# Patient Record
Sex: Male | Born: 1960 | Race: Black or African American | Hispanic: No | Marital: Married | Smoking: Former smoker
Health system: Southern US, Community
[De-identification: ages and names within clinical notes are randomized; demographics above are authoritative.]

## PROBLEM LIST (undated history)

## (undated) DIAGNOSIS — R030 Elevated blood-pressure reading, without diagnosis of hypertension: Secondary | ICD-10-CM

## (undated) DIAGNOSIS — R0600 Dyspnea, unspecified: Secondary | ICD-10-CM

## (undated) DIAGNOSIS — Z95828 Presence of other vascular implants and grafts: Secondary | ICD-10-CM

## (undated) DIAGNOSIS — G43909 Migraine, unspecified, not intractable, without status migrainosus: Secondary | ICD-10-CM

## (undated) DIAGNOSIS — R918 Other nonspecific abnormal finding of lung field: Secondary | ICD-10-CM

## (undated) DIAGNOSIS — N183 Chronic kidney disease, stage 3 unspecified: Secondary | ICD-10-CM

## (undated) DIAGNOSIS — E1142 Type 2 diabetes mellitus with diabetic polyneuropathy: Secondary | ICD-10-CM

## (undated) DIAGNOSIS — Z7189 Other specified counseling: Secondary | ICD-10-CM

## (undated) DIAGNOSIS — R0609 Other forms of dyspnea: Secondary | ICD-10-CM

## (undated) DIAGNOSIS — F1010 Alcohol abuse, uncomplicated: Secondary | ICD-10-CM

## (undated) DIAGNOSIS — J90 Pleural effusion, not elsewhere classified: Secondary | ICD-10-CM

## (undated) DIAGNOSIS — E119 Type 2 diabetes mellitus without complications: Secondary | ICD-10-CM

## (undated) DIAGNOSIS — R739 Hyperglycemia, unspecified: Secondary | ICD-10-CM

## (undated) DIAGNOSIS — Z8639 Personal history of other endocrine, nutritional and metabolic disease: Secondary | ICD-10-CM

## (undated) DIAGNOSIS — Z794 Long term (current) use of insulin: Secondary | ICD-10-CM

## (undated) DIAGNOSIS — M1990 Unspecified osteoarthritis, unspecified site: Secondary | ICD-10-CM

## (undated) DIAGNOSIS — Z87828 Personal history of other (healed) physical injury and trauma: Secondary | ICD-10-CM

## (undated) DIAGNOSIS — Z716 Tobacco abuse counseling: Secondary | ICD-10-CM

## (undated) DIAGNOSIS — F1410 Cocaine abuse, uncomplicated: Secondary | ICD-10-CM

## (undated) DIAGNOSIS — I10 Essential (primary) hypertension: Secondary | ICD-10-CM

## (undated) DIAGNOSIS — E11319 Type 2 diabetes mellitus with unspecified diabetic retinopathy without macular edema: Secondary | ICD-10-CM

## (undated) DIAGNOSIS — F329 Major depressive disorder, single episode, unspecified: Secondary | ICD-10-CM

## (undated) DIAGNOSIS — H547 Unspecified visual loss: Secondary | ICD-10-CM

## (undated) DIAGNOSIS — Z9289 Personal history of other medical treatment: Secondary | ICD-10-CM

## (undated) DIAGNOSIS — C3490 Malignant neoplasm of unspecified part of unspecified bronchus or lung: Secondary | ICD-10-CM

## (undated) DIAGNOSIS — H548 Legal blindness, as defined in USA: Secondary | ICD-10-CM

## (undated) DIAGNOSIS — Z87448 Personal history of other diseases of urinary system: Secondary | ICD-10-CM

## (undated) DIAGNOSIS — Z972 Presence of dental prosthetic device (complete) (partial): Secondary | ICD-10-CM

## (undated) DIAGNOSIS — R413 Other amnesia: Secondary | ICD-10-CM

## (undated) DIAGNOSIS — E785 Hyperlipidemia, unspecified: Secondary | ICD-10-CM

## (undated) DIAGNOSIS — F32A Depression, unspecified: Secondary | ICD-10-CM

## (undated) DIAGNOSIS — R351 Nocturia: Secondary | ICD-10-CM

## (undated) DIAGNOSIS — L0501 Pilonidal cyst with abscess: Secondary | ICD-10-CM

## (undated) HISTORY — DX: Tobacco abuse counseling: Z71.6

## (undated) HISTORY — DX: Type 2 diabetes mellitus with diabetic polyneuropathy: E11.42

## (undated) HISTORY — DX: Unspecified osteoarthritis, unspecified site: M19.90

## (undated) HISTORY — DX: Alcohol abuse, uncomplicated: F10.10

## (undated) HISTORY — DX: Hyperglycemia, unspecified: R73.9

## (undated) HISTORY — DX: Migraine, unspecified, not intractable, without status migrainosus: G43.909

## (undated) HISTORY — DX: Other amnesia: R41.3

## (undated) HISTORY — DX: Major depressive disorder, single episode, unspecified: F32.9

## (undated) HISTORY — DX: Essential (primary) hypertension: I10

## (undated) HISTORY — DX: Other specified counseling: Z71.89

## (undated) HISTORY — DX: Depression, unspecified: F32.A

## (undated) HISTORY — DX: Pleural effusion, not elsewhere classified: J90

---

## 2002-04-25 ENCOUNTER — Encounter: Admission: RE | Admit: 2002-04-25 | Discharge: 2002-04-25 | Payer: Self-pay | Admitting: Sports Medicine

## 2002-05-29 ENCOUNTER — Encounter: Admission: RE | Admit: 2002-05-29 | Discharge: 2002-05-29 | Payer: Self-pay | Admitting: Sports Medicine

## 2002-06-28 ENCOUNTER — Encounter: Admission: RE | Admit: 2002-06-28 | Discharge: 2002-06-28 | Payer: Self-pay | Admitting: Family Medicine

## 2002-07-31 ENCOUNTER — Encounter: Admission: RE | Admit: 2002-07-31 | Discharge: 2002-07-31 | Payer: Self-pay | Admitting: Sports Medicine

## 2002-11-21 ENCOUNTER — Encounter: Admission: RE | Admit: 2002-11-21 | Discharge: 2002-11-21 | Payer: Self-pay | Admitting: Family Medicine

## 2003-03-20 ENCOUNTER — Encounter: Admission: RE | Admit: 2003-03-20 | Discharge: 2003-03-20 | Payer: Self-pay | Admitting: Family Medicine

## 2003-03-20 ENCOUNTER — Ambulatory Visit (HOSPITAL_COMMUNITY): Admission: RE | Admit: 2003-03-20 | Discharge: 2003-03-20 | Payer: Self-pay | Admitting: Family Medicine

## 2003-12-03 ENCOUNTER — Ambulatory Visit: Payer: Self-pay | Admitting: Family Medicine

## 2003-12-03 ENCOUNTER — Observation Stay (HOSPITAL_COMMUNITY): Admission: EM | Admit: 2003-12-03 | Discharge: 2003-12-05 | Payer: Self-pay | Admitting: Emergency Medicine

## 2003-12-17 ENCOUNTER — Ambulatory Visit: Payer: Self-pay | Admitting: Sports Medicine

## 2003-12-24 ENCOUNTER — Encounter: Admission: RE | Admit: 2003-12-24 | Discharge: 2003-12-24 | Payer: Self-pay | Admitting: Family Medicine

## 2005-06-19 ENCOUNTER — Emergency Department (HOSPITAL_COMMUNITY): Admission: EM | Admit: 2005-06-19 | Discharge: 2005-06-19 | Payer: Self-pay | Admitting: Emergency Medicine

## 2005-12-14 ENCOUNTER — Ambulatory Visit: Payer: Self-pay | Admitting: Sports Medicine

## 2006-01-04 ENCOUNTER — Ambulatory Visit: Payer: Self-pay | Admitting: Family Medicine

## 2006-04-14 DIAGNOSIS — E785 Hyperlipidemia, unspecified: Secondary | ICD-10-CM

## 2006-04-14 DIAGNOSIS — E1139 Type 2 diabetes mellitus with other diabetic ophthalmic complication: Secondary | ICD-10-CM | POA: Insufficient documentation

## 2006-04-14 DIAGNOSIS — F1010 Alcohol abuse, uncomplicated: Secondary | ICD-10-CM | POA: Insufficient documentation

## 2006-05-10 ENCOUNTER — Ambulatory Visit: Payer: Self-pay | Admitting: Family Medicine

## 2006-05-13 ENCOUNTER — Ambulatory Visit: Payer: Self-pay | Admitting: Family Medicine

## 2007-02-08 ENCOUNTER — Emergency Department (HOSPITAL_COMMUNITY): Admission: EM | Admit: 2007-02-08 | Discharge: 2007-02-08 | Payer: Self-pay | Admitting: Family Medicine

## 2007-02-20 ENCOUNTER — Telehealth: Payer: Self-pay | Admitting: *Deleted

## 2007-10-09 ENCOUNTER — Telehealth: Payer: Self-pay | Admitting: *Deleted

## 2007-10-18 ENCOUNTER — Ambulatory Visit: Payer: Self-pay | Admitting: Family Medicine

## 2007-10-18 LAB — CONVERTED CEMR LAB
ALT: 38 units/L (ref 0–53)
AST: 22 units/L (ref 0–37)
Albumin: 4.4 g/dL (ref 3.5–5.2)
Alkaline Phosphatase: 71 units/L (ref 39–117)
BUN: 14 mg/dL (ref 6–23)
CO2: 25 meq/L (ref 19–32)
Calcium: 9.5 mg/dL (ref 8.4–10.5)
Chloride: 98 meq/L (ref 96–112)
Creatinine, Ser: 0.95 mg/dL (ref 0.40–1.50)
Glucose, Bld: 329 mg/dL — ABNORMAL HIGH (ref 70–99)
Hgb A1c MFr Bld: 9.7 %
Potassium: 4.9 meq/L (ref 3.5–5.3)
Sodium: 134 meq/L — ABNORMAL LOW (ref 135–145)
Total Bilirubin: 0.4 mg/dL (ref 0.3–1.2)
Total Protein: 7.7 g/dL (ref 6.0–8.3)

## 2007-10-19 ENCOUNTER — Encounter: Payer: Self-pay | Admitting: Family Medicine

## 2007-12-05 ENCOUNTER — Telehealth: Payer: Self-pay | Admitting: Family Medicine

## 2008-01-03 ENCOUNTER — Ambulatory Visit: Payer: Self-pay | Admitting: Family Medicine

## 2008-01-03 LAB — CONVERTED CEMR LAB: Hgb A1c MFr Bld: 12 %

## 2008-01-05 ENCOUNTER — Encounter: Payer: Self-pay | Admitting: Family Medicine

## 2008-03-20 ENCOUNTER — Ambulatory Visit: Payer: Self-pay | Admitting: Family Medicine

## 2008-05-01 ENCOUNTER — Ambulatory Visit: Payer: Self-pay | Admitting: Family Medicine

## 2008-05-01 LAB — CONVERTED CEMR LAB
ALT: 24 units/L (ref 0–53)
AST: 17 units/L (ref 0–37)
Albumin: 4.5 g/dL (ref 3.5–5.2)
Alkaline Phosphatase: 74 units/L (ref 39–117)
BUN: 11 mg/dL (ref 6–23)
CO2: 27 meq/L (ref 19–32)
Calcium: 9.4 mg/dL (ref 8.4–10.5)
Chloride: 95 meq/L — ABNORMAL LOW (ref 96–112)
Cholesterol: 226 mg/dL — ABNORMAL HIGH (ref 0–200)
Creatinine, Ser: 0.97 mg/dL (ref 0.40–1.50)
Glucose, Bld: 310 mg/dL — ABNORMAL HIGH (ref 70–99)
HDL: 41 mg/dL (ref >39)
LDL Cholesterol: 164 mg/dL — ABNORMAL HIGH (ref 0–99)
Potassium: 4.6 meq/L (ref 3.5–5.3)
Sodium: 134 meq/L — ABNORMAL LOW (ref 135–145)
Total Bilirubin: 0.5 mg/dL (ref 0.3–1.2)
Total CHOL/HDL Ratio: 5.5
Total Protein: 7.6 g/dL (ref 6.0–8.3)
Triglycerides: 104 mg/dL (ref <150)
VLDL: 21 mg/dL (ref 0–40)

## 2008-05-03 ENCOUNTER — Encounter: Payer: Self-pay | Admitting: Family Medicine

## 2008-12-08 ENCOUNTER — Emergency Department (HOSPITAL_COMMUNITY): Admission: EM | Admit: 2008-12-08 | Discharge: 2008-12-08 | Payer: Self-pay | Admitting: Emergency Medicine

## 2009-04-15 ENCOUNTER — Encounter: Payer: Self-pay | Admitting: *Deleted

## 2009-06-04 ENCOUNTER — Ambulatory Visit: Payer: Self-pay | Admitting: Family Medicine

## 2009-06-04 LAB — CONVERTED CEMR LAB: Hgb A1c MFr Bld: >14 %

## 2009-06-24 ENCOUNTER — Encounter: Payer: Self-pay | Admitting: Family Medicine

## 2009-09-10 ENCOUNTER — Ambulatory Visit: Payer: Self-pay | Admitting: Family Medicine

## 2009-09-10 LAB — CONVERTED CEMR LAB
ALT: 19 units/L (ref 0–53)
AST: 17 units/L (ref 0–37)
Albumin: 3.9 g/dL (ref 3.5–5.2)
Alkaline Phosphatase: 51 units/L (ref 39–117)
BUN: 10 mg/dL (ref 6–23)
CO2: 27 meq/L (ref 19–32)
Calcium: 9 mg/dL (ref 8.4–10.5)
Chloride: 106 meq/L (ref 96–112)
Creatinine, Ser: 0.97 mg/dL (ref 0.40–1.50)
Direct LDL: 105 mg/dL — ABNORMAL HIGH
Glucose, Bld: 135 mg/dL — ABNORMAL HIGH (ref 70–99)
Hgb A1c MFr Bld: 8.4 %
Potassium: 4.3 meq/L (ref 3.5–5.3)
Sodium: 141 meq/L (ref 135–145)
Total Bilirubin: 0.4 mg/dL (ref 0.3–1.2)
Total Protein: 6.2 g/dL (ref 6.0–8.3)

## 2009-09-11 ENCOUNTER — Encounter: Payer: Self-pay | Admitting: Family Medicine

## 2009-12-17 ENCOUNTER — Ambulatory Visit: Payer: Self-pay | Admitting: Family Medicine

## 2009-12-17 LAB — CONVERTED CEMR LAB: Hgb A1c MFr Bld: 10.7 %

## 2009-12-26 ENCOUNTER — Telehealth (INDEPENDENT_AMBULATORY_CARE_PROVIDER_SITE_OTHER): Payer: Self-pay | Admitting: Family Medicine

## 2010-03-17 NOTE — Miscellaneous (Signed)
Summary: re: ophthalmology referral  Clinical Lists Changes  Received notification form [facility] that they are unable to complete the ophthlomology referral at they time due to lack of volunteer physicians in this speciality group. They will notify patient when they can process  the referral. Theresia Lo RN  Jun 24, 2009 12:02 PM

## 2010-03-17 NOTE — Assessment & Plan Note (Signed)
Summary: MEDS/tcb   Vital Signs:  Patient profile:   50 year old male Height:      70 inches Weight:      171.0 pounds BMI:     24.62 Temp:     97.6 degrees F oral Pulse rate:   67 / minute BP sitting:   123 / 70  (left arm) Cuff size:   regular  Vitals Entered By: Gladstone Pih (June 04, 2009 11:47 AM) CC: med check, f/u DM Is Patient Diabetic? Yes Did you bring your meter with you today? No Pain Assessment Patient in pain? no        CC:  med check and f/u DM.  History of Present Illness: Diabetes follow up with blood sugars in  unknown  good control,   no episodes of low blood sugar. Taking medicines regularly and having no problems with them. Did NOT get the letter I sent him so did NOT increase his lantus and is still taking 30 at night.  Alcohol use: went on a binge of drinking--is now sober 4 months and staying in a transition house in [place]. Feels like he is back on trach again  Vision has been a little different--not exactly blurry but sometimes focusing is mre difficult. Wants to be referref to optho--has not had an exam in a while.  Knee pain: left knee pain worse over last 4-8 months. No injury. Has had problems w thi s in past and was told he had arthritis at one point. Aches w a lot of walking. no clicking or giving way   Habits & Providers  Alcohol-Tobacco-Diet     Tobacco Status: current     Tobacco Counseling: to quit use of tobacco products     Cigarette Packs/Day: 0.5  Current Medications (verified): 1)  Glucophage 1000 Mg  Tabs (Metformin Hcl) .Marland Kitchen.. 1 By Mouth Two Times A Day 2)  Lantus 100 Unit/ml Soln (Insulin Glargine) .... 50 Unitsu At Bedtime Disp Qs 1 Month 3)  Aspirin 81 Mg  Tbec (Aspirin) .... One By Mouth Every Day  Social History: Smoking Status:  current Packs/Day:  0.5  Physical Exam  General:  alert and well-developed.   Eyes:  pupils equal, pupils round, and pupils reactive to light.   Mouth:  teeth missing.   Neck:  supple, full  ROM, and no masses.   Lungs:  normal breath sounds.   Heart:  regular rhythm and no murmur.   Msk:  left knee lacks full extension by 5-10 degrees. no swelling. Medial joint line pain. ligamentously intact. no effusion. no warmth or erythema.   Impression & Recommendations:  Problem # 1:  DIABETES MELLITUS II, UNCOMPLICATED (ICD-250.00)  His updated medication list for this problem includes:    Glucophage 1000 Mg Tabs (Metformin hcl) .Marland Kitchen... 1 by mouth two times a day    Lantus 100 Unit/ml Soln (Insulin glargine) .Marland KitchenMarland KitchenMarland KitchenMarland Kitchen 50 unitsu at bedtime disp qs 1 month    Aspirin 81 Mg Tbec (Aspirin) ..... One by mouth every day discussed increasing to 50 u lantus. Orders: A1C-[facility] (16109) Ophthalmology Referral (Ophthalmology) [facility]- Est  Level 4 (60454)  Problem # 2:  ALCOHOL ABUSE, UNSPECIFIED (ICD-305.00)  Orders: [facility]- Est  Level 4 (09811) discussed  Problem # 3:  KNEE PAIN, LEFT (ICD-719.46)  His updated medication list for this problem includes:    Aspirin 81 Mg Tbec (Aspirin) ..... One by mouth every day injection therapy today will follow discussed possible effects of steroid inj on blood  sugars in next few days  Complete Medication List: 1)  Glucophage 1000 Mg Tabs (Metformin hcl) .Marland Kitchen.. 1 by mouth two times a day 2)  Lantus 100 Unit/ml Soln (Insulin glargine) .... 50 unitsu at bedtime disp qs 1 month 3)  Aspirin 81 Mg Tbec (Aspirin) .... One by mouth every day  Prescriptions: LANTUS 100 UNIT/ML SOLN (INSULIN GLARGINE) 50 unitsu at bedtime disp qs 1 month  #46m x 5   Entered and Authorized by:   Denny Levy MD   Signed by:   Denny Levy MD on 06/04/2009   Method used:   Print then Give to Patient   RxID:   [id number] GLUCOPHAGE 1000 MG  TABS (METFORMIN HCL) 1 by mouth two times a day  #60 x 4   Entered and Authorized by:   Denny Levy MD   Signed by:   Denny Levy MD on 06/04/2009   Method used:   Print then Give to Patient   RxID:   [id number]    Laboratory Results     Blood Tests   Date/Time Received: June 04, 2009 11:50 AM  Date/Time Reported: June 04, 2009 12:29 PM   HGBA1C: >14.0%   (Normal Range: Non-Diabetic - 3-6%   Control Diabetic - 6-8%)  Comments: ...............test performed by......Marland KitchenBonnie A. [place], MLS (ASCP)cm

## 2010-03-17 NOTE — Progress Notes (Signed)
----   Converted from flag ---- ---- 12/23/2009 10:55 AM, Madelon Lips Pharm D wrote: I think we should try to get him financially qualified for the MAP program [place].   He will need to qualify with [facility].   We may be able to help with samples at times BUT that is NOT a good idea long-term.   ---- 12/19/2009 1:48 PM, Denny Levy MD wrote: Cindee Lame this guy cannot afford his insulin any ideas? ------------------------------  DWT please call him and tell him how to go see Rudell Cobb / MAP .t  Denny Levy MD  December 26, 2009 2:58 PM Thanks!  Denny Levy MD  December 26, 2009 2:58 PM  Phone Note Outgoing Call   Call placed by: Loralee Pacas CMA,  December 29, 2009 12:34 PM

## 2010-03-17 NOTE — Miscellaneous (Signed)
Summary: sample of Lantus given  Clinical Lists Changes   patient comes in office stating he went to get Lantus RX from [facility] and it was going to cost too much for him to afford. he is in the process of getting orange card so he can get meds from [facility] pharmacy.  Hopefully he will get it in 10 days. gave him one sample of Lantus lot # [id number] exp date 06/2010. has a follow up appointment with Dr. Jennette Kettle 04/30/2009. Theresia Lo RN  April 15, 2009 12:21 PM

## 2010-03-17 NOTE — Letter (Signed)
Summary: [facility]  [address]   [place], [place] [address]   Phone: [phone]  Fax: [phone]    09/11/2009  Keith Holloway [address] [place], [place]  [address]  Dear Mr. [facility],  Your lab work looked A-OK. Your A1C is down to 8.4 as you know. Your LDL cholesterol is 105 and around 100 is our current goal. Eveything else was normal.Great to see you!         Sincerely,   Denny Levy MD  Appended Document: LABLetter mailed

## 2010-03-17 NOTE — Assessment & Plan Note (Signed)
Summary: f/u,df   Vital Signs:  Patient profile:   50 year old male Weight:      168.9 pounds Temp:     97.9 degrees F oral Pulse rate:   69 / minute Pulse rhythm:   regular BP sitting:   124 / 83  (left arm) Cuff size:   regular  Vitals Entered By: Loralee Pacas CMA (December 17, 2009 8:48 AM) CC: follow-up visit Is Patient Diabetic? Yes Did you bring your meter with you today? No Comments losing weight   CC:  follow-up visit.  History of Present Illness: not able to afford his insulin has missed about a month or more  Habits & Providers  Alcohol-Tobacco-Diet     Tobacco Status: current     Tobacco Counseling: to quit use of tobacco products     Cigarette Packs/Day: 0.5     Year Quit: 1996  Exercise-Depression-Behavior     Does Patient Exercise: yes     Exercise Counseling: to improve exercise regimen     Type of exercise: walking     Exercise (avg: min/session): 30-60     Times/week: 3     Have you felt down or hopeless? no     Have you felt little pleasure in things? no     Depression Counseling: not indicated; screening negative for depression     Seat Belt Use: always  Current Medications (verified): 1)  Glucophage 1000 Mg  Tabs (Metformin Hcl) .Marland Kitchen.. 1 By Mouth Two Times A Day 2)  Lantus 100 Unit/ml Soln (Insulin Glargine) .... 50 Unitsu At Bedtime Disp Qs 1 Month 3)  Aspirin 81 Mg  Tbec (Aspirin) .... One By Mouth Every Day 4)  Lisinopril 2.5 Mg Tabs (Lisinopril) .Marland Kitchen.. 1 By Mouth Once Daily For Kidney Protection  Social History: Does Patient Exercise:  yes Seat Belt Use:  always  Physical Exam  General:  alert, well-developed, and well-nourished.   Neck:  supple, full ROM, and no masses.   Lungs:  normal breath sounds.   Heart:  regular rhythm.   Msk:  FEET B had soft touch sensation intact  Vascular status  normal Dp 2+ B mild dry skin, no deformity or excessive callous. Beginning to have some hammer toe deformity R 2nd   Impression &  Recommendations:  Problem # 1:  DIABETES MELLITUS II, UNCOMPLICATED (ICD-250.00)  His updated medication list for this problem includes:    Glucophage 1000 Mg Tabs (Metformin hcl) .Marland Kitchen... 1 by mouth two times a day    Lantus 100 Unit/ml Soln (Insulin glargine) .Marland KitchenMarland KitchenMarland KitchenMarland Kitchen 50 unitsu at bedtime disp qs 1 month    Aspirin 81 Mg Tbec (Aspirin) ..... One by mouth every day    Lisinopril 2.5 Mg Tabs (Lisinopril) .Marland Kitchen... 1 by mouth once daily for kidney protection  Orders: A1C-[facility] (45409) [facility]- Est Level  3 (81191) will see if her can get him on program for insulin cost long discussion--he is not able to afford his insulin. we gave him a botle today--will increase to 70 units although hard to judge what he has really been getting.  Complete Medication List: 1)  Glucophage 1000 Mg Tabs (Metformin hcl) .Marland Kitchen.. 1 by mouth two times a day 2)  Lantus 100 Unit/ml Soln (Insulin glargine) .... 50 unitsu at bedtime disp qs 1 month 3)  Aspirin 81 Mg Tbec (Aspirin) .... One by mouth every day 4)  Lisinopril 2.5 Mg Tabs (Lisinopril) .Marland Kitchen.. 1 by mouth once daily for kidney protection  Patient Instructions:  1)  Increase your lantus to 70 units a day instead of the 50 units you have been taking. For now, continue the twice a day glucophage---we may get rid of that later. I am also adding one pill a day of lisinopril--that is for kidney protection for you diabetes. Please see me in 3 months.   Prescriptions: LISINOPRIL 2.5 MG TABS (LISINOPRIL) 1 by mouth once daily for kidney protection  #30 x 12   Entered and Authorized by:   Denny Levy MD   Signed by:   Denny Levy MD on 12/17/2009   Method used:   Electronically to        [facility]  [address] [phone]* (retail)       [address]       [place], [place]  [address]       Ph: [phone]       Fax: [phone]   RxID:   [id number]    Orders Added: 1)  A1C-[facility] [83036] 2)  [facility]- Est Level  3 [24401]    Prevention & Chronic  Care Immunizations   Influenza vaccine: Not documented    Tetanus booster: 05/01/2008: given   Tetanus booster due: 05/02/2018    Pneumococcal vaccine: Not documented  Other Screening   PSA: Not documented   Smoking status: current  (12/17/2009)  Diabetes Mellitus   HgbA1C: 10.7  (12/17/2009)   Hemoglobin A1C due: 04/04/2008    Eye exam: Not documented    Foot exam: Not documented   High risk foot: Not documented   Foot care education: Not documented    Urine microalbumin/creatinine ratio: Not documented    Diabetes flowsheet reviewed?: Yes   Progress toward A1C goal: Improved  Lipids   Total Cholesterol: 226  (05/01/2008)   LDL: 164  (05/01/2008)   LDL Direct: 105  (09/10/2009)   HDL: 41  (05/01/2008)   Triglycerides: 104  (05/01/2008)    SGOT (AST): 17  (09/10/2009)   SGPT (ALT): 19  (09/10/2009)   Alkaline phosphatase: 51  (09/10/2009)   Total bilirubin: 0.4  (09/10/2009)    Lipid flowsheet reviewed?: Yes   Progress toward LDL goal: At goal  Self-Management Support :    Diabetes self-management support: Not documented    Lipid self-management support: Not documented   Laboratory Results   Blood Tests   Date/Time Received: December 17, 2009 8:55 AM  Date/Time Reported: December 17, 2009 9:51 AM   HGBA1C: 10.7%   (Normal Range: Non-Diabetic - 3-6%   Control Diabetic - 6-8%)  Comments: ...............test performed by......Marland KitchenBonnie A. [place], MLS (ASCP)cm

## 2010-03-17 NOTE — Assessment & Plan Note (Signed)
Summary: f/u eo   Vital Signs:  Patient profile:   50 year old male Height:      70 inches Weight:      184 pounds BMI:     26.50 Temp:     97.8 degrees F oral Pulse rate:   69 / minute BP sitting:   112 / 74  (right arm) Cuff size:   regular  Vitals Entered By: Jimmy Footman, CMA (September 10, 2009 10:28 AM) CC: F/u diabetes, bloodwork Is Patient Diabetic? Yes Did you bring your meter with you today? No Pain Assessment Patient in pain? no        CC:  F/u diabetes and bloodwork.  History of Present Illness: Diabetes follow up with blood sugars in    pretty good control,   no episodes of low blood sugar. Taking medicines regularly when he can afford them and having no problems with them. Has an appointment today with the MAP program, currently out of insulin  Still living at [address] in [place]--is almost 6 months sober / clean now. At 1 year he hopes to transition out to be able to work     Current Medications (verified): 1)  Glucophage 1000 Mg  Tabs (Metformin Hcl) .Marland Kitchen.. 1 By Mouth Two Times A Day 2)  Lantus 100 Unit/ml Soln (Insulin Glargine) .... 50 Unitsu At Bedtime Disp Qs 1 Month 3)  Aspirin 81 Mg  Tbec (Aspirin) .... One By Mouth Every Day  Past History:  Past Medical History: Last updated: 04/14/2006 diabetic retinopathy-- [facility] laser 10/07, lost to fup, returned 10/07 --aic>10  Review of Systems  The patient denies anorexia, fever, weight loss, weight gain, chest pain, syncope, dyspnea on exertion, peripheral edema, prolonged cough, abdominal pain, incontinence, and muscle weakness.    Physical Exam  General:  alert, well-developed, and underweight appearing.   Eyes:  conjunctiva non icteric Nose:  no external deformity.   Neck:  supple, full ROM, and no masses.   Lungs:  normal respiratory effort and normal breath sounds.   Heart:  normal rate, regular rhythm, and no murmur.     Impression & Recommendations:  Problem # 1:  DIABETES MELLITUS II,  UNCOMPLICATED (ICD-250.00)  His updated medication list for this problem includes:    Glucophage 1000 Mg Tabs (Metformin hcl) .Marland Kitchen... 1 by mouth two times a day    Lantus 100 Unit/ml Soln (Insulin glargine) .Marland KitchenMarland KitchenMarland KitchenMarland Kitchen 50 unitsu at bedtime disp qs 1 month    Aspirin 81 Mg Tbec (Aspirin) ..... One by mouth every day  Orders: A1C-[facility] (16109) Comp Met-[facility] (60454-09811) [facility]- Est  Level 4 (91478) gave him 2 bottles of lantus--urged him to get back to MAP program--we may end up switching him to 70-30 if that is all that MAP has available. Great improvement from an A1C of 14 rtc 3 m  Problem # 2:  ALCOHOL ABUSE, UNSPECIFIED (ICD-305.00)  currently sober 6 m  Orders: [facility]- Est  Level 4 (29562)  Problem # 3:  HYPERLIPIDEMIA (ICD-272.4)  Orders: Comp Met-[facility] (13086-57846) Direct LDL-[facility] (96295-28413) [facility]- Est  Level 4 (24401)  Complete Medication List: 1)  Glucophage 1000 Mg Tabs (Metformin hcl) .Marland Kitchen.. 1 by mouth two times a day 2)  Lantus 100 Unit/ml Soln (Insulin glargine) .... 50 unitsu at bedtime disp qs 1 month 3)  Aspirin 81 Mg Tbec (Aspirin) .... One by mouth every day  Laboratory Results   Blood Tests   Date/Time Received: September 10, 2009 10:34 AM  Date/Time Reported:  September 10, 2009 10:54 AM   HGBA1C: 8.4%   (Normal Range: Non-Diabetic - 3-6%   Control Diabetic - 6-8%)  Comments: ...............test performed by......Marland KitchenBonnie A. [place], MLS (ASCP)cm

## 2010-03-26 ENCOUNTER — Encounter: Payer: Self-pay | Admitting: *Deleted

## 2010-05-12 ENCOUNTER — Inpatient Hospital Stay (INDEPENDENT_AMBULATORY_CARE_PROVIDER_SITE_OTHER)
Admission: RE | Admit: 2010-05-12 | Discharge: 2010-05-12 | Disposition: A | Discharge status: Home / Self Care | Payer: Self-pay | Source: Ambulatory Visit | Attending: Family Medicine | Admitting: Family Medicine

## 2010-05-12 DIAGNOSIS — E119 Type 2 diabetes mellitus without complications: Secondary | ICD-10-CM

## 2010-05-12 DIAGNOSIS — L723 Sebaceous cyst: Secondary | ICD-10-CM

## 2010-05-12 LAB — POCT URINALYSIS DIP (DEVICE)
Bilirubin Urine: NEGATIVE
Glucose, UA: 500 mg/dL — AB
Ketones, ur: NEGATIVE mg/dL
Nitrite: NEGATIVE
Protein, ur: NEGATIVE mg/dL
Specific Gravity, Urine: <=1.005 (ref 1.005–1.030)
Urobilinogen, UA: 0.2 mg/dL (ref 0.0–1.0)
pH: 6 (ref 5.0–8.0)

## 2010-05-12 LAB — GLUCOSE, CAPILLARY: Glucose-Capillary: 521 mg/dL — ABNORMAL HIGH (ref 70–99)

## 2010-05-13 ENCOUNTER — Other Ambulatory Visit: Payer: Self-pay | Admitting: Family Medicine

## 2010-05-13 MED ORDER — METFORMIN HCL 1000 MG PO TABS: 1000.0000 mg | ORAL_TABLET | Freq: Two times a day (BID) | ORAL | Status: DC

## 2010-05-18 ENCOUNTER — Observation Stay (HOSPITAL_COMMUNITY)
Admission: EM | Admit: 2010-05-18 | Discharge: 2010-05-18 | Disposition: A | Discharge status: Home / Self Care | Payer: Self-pay | Attending: Emergency Medicine | Admitting: Emergency Medicine

## 2010-05-18 DIAGNOSIS — E119 Type 2 diabetes mellitus without complications: Principal | ICD-10-CM | POA: Insufficient documentation

## 2010-05-18 DIAGNOSIS — Z5900 Homelessness unspecified: Secondary | ICD-10-CM | POA: Insufficient documentation

## 2010-05-18 DIAGNOSIS — F1010 Alcohol abuse, uncomplicated: Secondary | ICD-10-CM | POA: Insufficient documentation

## 2010-05-18 DIAGNOSIS — R21 Rash and other nonspecific skin eruption: Secondary | ICD-10-CM | POA: Insufficient documentation

## 2010-05-18 DIAGNOSIS — F1410 Cocaine abuse, uncomplicated: Secondary | ICD-10-CM | POA: Insufficient documentation

## 2010-05-18 DIAGNOSIS — Z794 Long term (current) use of insulin: Secondary | ICD-10-CM | POA: Insufficient documentation

## 2010-05-18 DIAGNOSIS — F17200 Nicotine dependence, unspecified, uncomplicated: Secondary | ICD-10-CM | POA: Insufficient documentation

## 2010-05-18 LAB — POCT I-STAT, CHEM 8
BUN: 13 mg/dL (ref 6–23)
Calcium, Ion: 1.11 mmol/L — ABNORMAL LOW (ref 1.12–1.32)
Chloride: 100 mEq/L (ref 96–112)
Creatinine, Ser: 1.1 mg/dL (ref 0.4–1.5)
Glucose, Bld: 423 mg/dL — ABNORMAL HIGH (ref 70–99)
HCT: 45 % (ref 39.0–52.0)
Hemoglobin: 15.3 g/dL (ref 13.0–17.0)
Potassium: 4.4 mEq/L (ref 3.5–5.1)
Sodium: 137 mEq/L (ref 135–145)
TCO2: 26 mmol/L (ref 0–100)

## 2010-05-18 LAB — GLUCOSE, CAPILLARY
Glucose-Capillary: 529 mg/dL — ABNORMAL HIGH (ref 70–99)
Glucose-Capillary: 280 mg/dL — ABNORMAL HIGH (ref 70–99)
Glucose-Capillary: 243 mg/dL — ABNORMAL HIGH (ref 70–99)
Glucose-Capillary: 219 mg/dL — ABNORMAL HIGH (ref 70–99)

## 2010-05-18 LAB — POCT I-STAT 3, VENOUS BLOOD GAS (G3P V)
Acid-Base Excess: 5 mmol/L — ABNORMAL HIGH (ref 0.0–2.0)
Bicarbonate: 31 mEq/L — ABNORMAL HIGH (ref 20.0–24.0)
O2 Saturation: 55 %
TCO2: 33 mmol/L (ref 0–100)
pCO2, Ven: 51.3 mmHg — ABNORMAL HIGH (ref 45.0–50.0)
pH, Ven: 7.39 — ABNORMAL HIGH (ref 7.250–7.300)
pO2, Ven: 30 mmHg (ref 30.0–45.0)

## 2010-05-21 LAB — CBC
HCT: 46.3 % (ref 39.0–52.0)
Hemoglobin: 15 g/dL (ref 13.0–17.0)
MCHC: 32.5 g/dL (ref 30.0–36.0)
MCV: 71.8 fL — ABNORMAL LOW (ref 78.0–100.0)
Platelets: 179 10*3/uL (ref 150–400)
RBC: 6.44 MIL/uL — ABNORMAL HIGH (ref 4.22–5.81)
RDW: 14.1 % (ref 11.5–15.5)
WBC: 3.7 10*3/uL — ABNORMAL LOW (ref 4.0–10.5)

## 2010-05-21 LAB — DIFFERENTIAL
Basophils Absolute: 0 10*3/uL (ref 0.0–0.1)
Basophils Relative: 1 % (ref 0–1)
Eosinophils Relative: 1 % (ref 0–5)
Monocytes Absolute: 0.3 10*3/uL (ref 0.1–1.0)
Monocytes Relative: 9 % (ref 3–12)

## 2010-05-21 LAB — BASIC METABOLIC PANEL
BUN: 10 mg/dL (ref 6–23)
CO2: 30 mEq/L (ref 19–32)
Calcium: 9.1 mg/dL (ref 8.4–10.5)
Chloride: 99 mEq/L (ref 96–112)
Creatinine, Ser: 0.97 mg/dL (ref 0.4–1.5)
GFR calc Af Amer: >60 mL/min (ref >60)
GFR calc non Af Amer: >60 mL/min (ref >60)
Glucose, Bld: 413 mg/dL — ABNORMAL HIGH (ref 70–99)
Potassium: 4.1 mEq/L (ref 3.5–5.1)
Sodium: 134 mEq/L — ABNORMAL LOW (ref 135–145)

## 2010-05-21 LAB — ETHANOL: Alcohol, Ethyl (B): <5 mg/dL (ref 0–10)

## 2010-05-21 LAB — GLUCOSE, CAPILLARY: Glucose-Capillary: 250 mg/dL — ABNORMAL HIGH (ref 70–99)

## 2010-05-21 LAB — RAPID URINE DRUG SCREEN, HOSP PERFORMED
Amphetamines: NOT DETECTED
Barbiturates: NOT DETECTED
Benzodiazepines: NOT DETECTED
Cocaine: POSITIVE — AB
Opiates: NOT DETECTED
Tetrahydrocannabinol: NOT DETECTED

## 2010-07-03 NOTE — H&P (Signed)
NAME:  Keith Holloway, TSE NO.:  [id number]   MEDICAL RECORD NO.:  [id number]          PATIENT TYPE:  OBV   LOCATION:  1824                         FACILITY:  [facility]   PHYSICIAN:  Barney Drain, M.D.    DATE OF BIRTH:  1960/06/19   DATE OF ADMISSION:  12/03/2003  DATE OF DISCHARGE:                                HISTORY & PHYSICAL   HISTORY OF PRESENT ILLNESS:  A 50 year old male with a history of diabetes  brought to the emergency department after experiencing lightheadedness,  blurred vision and nausea while at work prior to admission.  The patient  states that he felt that his glucose was high, although he did not check.  He felt better after eating an apple.  Over the past three weeks the patient  has had subjective fevers and shaking chills and night sweats, nonproductive  cough, severe sore throat.  No known sick contacts or travel.  No nausea,  vomiting, diarrhea, or constipation.  Complains of a significantly decreased  appetite.  He states that he has been able to go to work, but has decreased  his socialization significantly.  He has had much fatigue and marked  decrease in activity tolerance for the past several months.  He denies any  dyspnea on exertion.  He has had no incarceration longer than a few days in  a county jail.  There has been an estimated weight loss from 210 to about  185 pounds over the past month.  The patient and wife note that his clothes  definitely fit differently.  There has been no chest pain, shortness of  breath, or edema.  No dyspnea on exertion or hemoptysis.  The patient has  noted increased flatus and he has felt bloated.  He had had some reflux  symptoms feeling that there is something in his throat, which was helped by  an over-the-counter H2 blocker.  There has been no numbness, paresthesias or  weakness.  No jaundice or rash.  The patient has felt down since his illness  over the past few weeks, but denies any depression.   Depression screen  negative.  He states that his bilateral knees have been uncomfortable,  especially over the past 24 hours, but has not noted any effusion.  He  complains of persistent polyuria and polydipsia, which he states is  consistent with his sugars being high.  There has been no penile discharge  or dysuria.  Overall, the patient states that his symptoms are getting  worse.   PAST MEDICAL HISTORY:  1.  Diabetes mellitus type 2.  Last hemoglobin A1c was 10.7 from February      2005.  2.  Hyperlipidemia.  3.  The patient has quit smoking.   PAST SURGICAL HISTORY:  None.   MEDICATIONS:  1.  Ecotrin 325 mg p.o. daily.  2.  Glucotrol XL 10 mg b.i.d.  3.  Glucovance 05/998 mg b.i.d.  4.  Lipitor 20 mg daily.   ALLERGIES:  NO KNOWN DRUG ALLERGIES.   SOCIAL HISTORY:  The patient is married and lives with his wife Mindi Junker who  can be reached  at her cell phone [phone].  He has been tobacco-free for  more than 10 years, he previously smoked 1/2 pack a day.  He denies any  recreational drug use.  He is a binge drinker on the weekends and about one  time a week, he used to drink regularly.  CAGE questionnaire is positive.  He has attended support groups in the past to quit drinking, however he  states that he has not admitted that he has a drinking problem and once he  does, he knows he will be able to quit.  He is employed at [employer].   FAMILY HISTORY:  Significant for diabetes.   REVIEW OF SYSTEMS:  Documented above.   PHYSICAL EXAMINATION:  GENERAL:  Well cared for, slightly fatigued-appearing  male, comfortable, pleasant, wife is at bedside.  VITAL SIGNS:  Temperature 103.1 at 14:00, currently 102, heart rate 96,  blood pressure 138/73, O2 saturation 95% on room air.  HEENT:  Normocephalic and atraumatic.  Mucosal membranes are pink and moist.  Oropharynx is erythematous with mild tonsillar pallor, edema, no exudates.  The patient has a partial upper denture plate.   Gingiva are normal.  Nares  patent.  NECK:  No thyromegaly or masses.  LYMPHATICS:  No palpable anterior, cervical, occipital, axillary  lymphadenopathy appreciated.  CARDIOVASCULAR:  Normal precordium S1, S2 regular rate and rhythm.  There is  a 1/6 systolic ejection murmur best heard at the base, no gallops, 2/2  peripheral pulses in all four extremities, no JVD.  Cap refill less than 2  seconds.  LUNGS:  Good air exchange, clear to auscultation bilaterally, except for dry  bibasilar crackle that is mild, no wheeze.  ABDOMEN:  Normoactive bowel sounds, moderately distended, tympany to  percussion, soft, nontender, no hepatosplenomegaly or masses.  EXTREMITIES:  No peripheral edema, erythema, or calor.  No joint effusion,  erythema, or calor.  The skin on the feet is dry and cracked, no open sores,  no rash, no jaundice, no clubbing.  NEUROLOGIC:  Alert and oriented, mentation intact.  Cranial nerves II-XII  intact.  Strength is 5/5 globally, sensation is intact to light touch  globally.   LABS:  White blood cell count 7.5, hemoglobin 14.6, platelets 197, ANC 6.6,  ALC 0.7, peripheral smear reveals __________  PMNs, large platelets, few  target cells and a few teardrop cells, sodium 128, corrected for glucose of  132, potassium 4.2, chloride 94, bicarb 24, BUN 9, creatinine 1, glucose  322, calcium 8.9.  Urinalysis -- yellow and clear, specific gravity 1.014,  glucose greater than 1000, protein 30, no nitrite, no leukocyte esterase.  Urine microscopic examination revealed rare squamous cells, white blood cell  count 0-2.  PA and lateral chest x-ray revealed linear atelectasis versus  scar in the lingula, no acute disease.   ASSESSMENT/PLAN:  A 50 year old male with a fever of unknown origin, weight  loss unintentional, and hyperglycemia.  1.  Fever of unknown origin documented.  Significant and persistent fever in      the emergency department with reported temperature chills and  sweats     prior to admission.  Differential diagnosis is broad and exam and labs      are reassuring overall.  Infection is less likely with leukocytosis and      nonlocalizing symptoms.  Oropharyngeal erythema and cough suggest an      upper respiratory infection, but prolonged course of symptoms is      inconsistent with a viral infection.  No increased respiratory rate or      hypoxia and a negative chest x-ray make pneumonia unlikely.  We will      check a PPD with history of night sweats and weight loss, but no      granulomas on chest x-ray.  We will follow blood cultures, send urine      for culture, check abdominal x-rays.  The patient is distended, cancer      is of concern, please see workup below.  Connective tissue disease is      possible.  We will check an ESR, consider ANCA and double-stranded DNA,      but again no specific localizing symptoms.  Knees have a normal      examination.  Blood smear is benign.  Differential within normal limits,      therefore leukemia and lymphoma are less likely.  We will follow fever      curve, labs, and hold antibiotics for now.  2.  Unintentional weight loss.  Cancer is the most concerning potential      etiology.  No neck mass, but complains of dysphasia that was improved by      an H2 blocker suggesting this was more of a reflux symptom than of      obstruction.  Poor appetite n.p.o., but no early satiety.  No bright red      blood per rectum or melena.  We will check fecal occult blood stools x3,      perform a chest and abdominal CT to evaluate lungs, check for      lymphadenopathy and liver disease, as the patient has a history of ETOH      and tobacco.  We will check liver function tests.  Endocrine disease is      also possible.  The patient has diabetes mellitus which could account      for malaise and weight loss, as his diabetes has been uncontrolled.  We      will check a TSH to rule out thyroid dysfunction.  Calcium is  within      normal limits therefore parathyroid dysfunction is less likely.  The      patient is a binge alcoholic.  I counseled him on cessation.  The      patient needs referral for a support group.  Depression is possible, but      his decreased mood correlates with his illness course and is mostly      reactive.  3.  Diabetes mellitus, uncontrolled.  May account for polyuria and      polydipsia, weight loss, and malaise.  We will check a hemoglobin A1c.      Initiate Lantus and insulin sliding scale.  Continue Glucotrol and hold      Glucovance as planning to perform CTs.  The patient needs diabetes      education.  Discuss foot care as feet are dry and cracked.  Begin      enalapril for renal protection.  Note the patient is a diabetic with      proteinuria and will follow creatinine.  Continue aspirin for coronary      artery disease prophylaxis.  Provide intravenous fluid rehydration to      help decrease sugars.  We will need to follow potassium closely as     providing intravenous fluid rehydration and insulin.  4.  Hyponatremia is relative.  We will follow.  5.  Alcoholism.  CAGE positive.  Consult and need referral for support      group.  6.  Hypercholesterolemia.  We will check a fasting lipid profile and      continue Lipitor.  7.  Gastroesophageal reflux disease.  We will provide Protonix while      inpatient.  Question if the patient needs an endoscopy with a history of      alcoholism and tobacco which increases his risk for gastric cancer, and      has significant reflux symptoms.  Certainly this may be a more      appropriate study to perform as an outpatient if it is indicated.  8.  Lightheadedness.  This was a presyncopal episode and most likely      secondary to hypoglycemia as this resolved with food.  Potential      significant fever could also cause these symptoms.  Reassured by      nonfocal neurologic examination.  The patient did not complain of       palpitations or chest pain.  We will follow clinically.   DISPOSITION:  We will admit for 23-hour observation to initiate workup.  We  will determine if further inpatient evaluation is necessary, as the patients  results come back.       SS/MEDQ  D:  12/03/2003  T:  12/04/2003  Job:  [id number]   cc:   Lurena Joiner L. Jolinda Croak, M.D.  [address] [place]  [place] [address]  Fax: [phone]

## 2010-11-20 LAB — WOUND CULTURE

## 2012-04-15 HISTORY — PX: RETINAL DETACHMENT SURGERY: SHX105

## 2012-08-15 HISTORY — PX: CATARACT EXTRACTION, BILATERAL: SHX1313

## 2013-03-28 ENCOUNTER — Other Ambulatory Visit: Payer: Self-pay | Admitting: *Deleted

## 2013-03-28 ENCOUNTER — Ambulatory Visit: Payer: Medicaid Other | Attending: Internal Medicine | Admitting: Internal Medicine

## 2013-03-28 VITALS — BP 143/88 | HR 88 | Temp 98.9°F | Resp 14 | Ht 71.0 in | Wt 208.0 lb

## 2013-03-28 DIAGNOSIS — R079 Chest pain, unspecified: Secondary | ICD-10-CM | POA: Insufficient documentation

## 2013-03-28 DIAGNOSIS — Z7982 Long term (current) use of aspirin: Secondary | ICD-10-CM | POA: Insufficient documentation

## 2013-03-28 DIAGNOSIS — Z23 Encounter for immunization: Secondary | ICD-10-CM

## 2013-03-28 DIAGNOSIS — E785 Hyperlipidemia, unspecified: Secondary | ICD-10-CM | POA: Insufficient documentation

## 2013-03-28 DIAGNOSIS — M79609 Pain in unspecified limb: Secondary | ICD-10-CM | POA: Insufficient documentation

## 2013-03-28 DIAGNOSIS — Z794 Long term (current) use of insulin: Secondary | ICD-10-CM | POA: Insufficient documentation

## 2013-03-28 DIAGNOSIS — E119 Type 2 diabetes mellitus without complications: Secondary | ICD-10-CM

## 2013-03-28 DIAGNOSIS — I10 Essential (primary) hypertension: Secondary | ICD-10-CM

## 2013-03-28 LAB — LIPID PANEL
Cholesterol: 112 mg/dL (ref 0–200)
HDL: 33 mg/dL — ABNORMAL LOW (ref >39)
LDL CALC: 56 mg/dL (ref 0–99)
Total CHOL/HDL Ratio: 3.4 Ratio
Triglycerides: 117 mg/dL (ref <150)
VLDL: 23 mg/dL (ref 0–40)

## 2013-03-28 LAB — COMPLETE METABOLIC PANEL WITH GFR
ALBUMIN: 4.3 g/dL (ref 3.5–5.2)
ALK PHOS: 94 U/L (ref 39–117)
ALT: 26 U/L (ref 0–53)
AST: 15 U/L (ref 0–37)
BUN: 15 mg/dL (ref 6–23)
CO2: 28 mEq/L (ref 19–32)
Calcium: 9.4 mg/dL (ref 8.4–10.5)
Chloride: 102 mEq/L (ref 96–112)
Creat: 0.97 mg/dL (ref 0.50–1.35)
GFR, EST NON AFRICAN AMERICAN: 89 mL/min
GFR, Est African American: >89 mL/min
GLUCOSE: 156 mg/dL — AB (ref 70–99)
POTASSIUM: 4.6 meq/L (ref 3.5–5.3)
SODIUM: 141 meq/L (ref 135–145)
TOTAL PROTEIN: 7.2 g/dL (ref 6.0–8.3)
Total Bilirubin: 0.3 mg/dL (ref 0.2–1.2)

## 2013-03-28 LAB — GLUCOSE, POCT (MANUAL RESULT ENTRY): POC Glucose: 147 mg/dl — AB (ref 70–99)

## 2013-03-28 MED ORDER — FREESTYLE SYSTEM KIT: 1.0000 | PACK | Status: DC | PRN

## 2013-03-28 MED ORDER — GLUCOSE BLOOD VI STRP: ORAL_STRIP | Status: DC

## 2013-03-28 MED ORDER — SIMVASTATIN 40 MG PO TABS: 40.0000 mg | ORAL_TABLET | Freq: Every day | ORAL | Status: DC

## 2013-03-28 MED ORDER — INSULIN NPH ISOPHANE & REGULAR (70-30) 100 UNIT/ML ~~LOC~~ SUSP: 30.0000 [IU] | Freq: Two times a day (BID) | SUBCUTANEOUS | Status: DC

## 2013-03-28 MED ORDER — METFORMIN HCL 1000 MG PO TABS
1000.0000 mg | ORAL_TABLET | Freq: Two times a day (BID) | ORAL | Status: DC
Start: 2013-03-28 — End: 2014-01-23

## 2013-03-28 MED ORDER — LISINOPRIL 20 MG PO TABS: 20.0000 mg | ORAL_TABLET | Freq: Every day | ORAL | Status: DC

## 2013-03-28 NOTE — Progress Notes (Signed)
Patient ID: Keith Holloway, male   DOB: March 25, 1960, 53 y.o.   MRN: [id number]   CC:  HPI: 53 year old male here to establish care he has a long-standing history of diabetes. His last A1c on 03/06/13 was 9.7. He currently is using 70 x 30 insulin 20 units in the morning and 20 units in the evening he does not check his sugar on a regular basis. The patient was recently released from jail. While he was in jail,He developed retinal detachment and was seen by Dr. Carlynn Herald, [facility] had surgery. He was diagnosed as being legally blind. He had another visit with Dr. Raylene Miyamoto in January 2015. Patient appears to be compliant with all his other medications.  Social history nonsmoker nonalcoholic Family history father had cancer but is not sure what kind   Allergies not on file Past Medical History  Diagnosis Date  . Diabetes mellitus without complication    Current Outpatient Prescriptions on File Prior to Visit  Medication Sig Dispense Refill  . aspirin 81 MG EC tablet Take 81 mg by mouth daily.         No current facility-administered medications on file prior to visit.   History reviewed. No pertinent family history. History   Social History  . Marital Status: Married    Spouse Name: N/A    Number of Children: N/A  . Years of Education: N/A   Occupational History  . Not on file.   Social History Main Topics  . Smoking status: Never Smoker   . Smokeless tobacco: Not on file  . Alcohol Use: No  . Drug Use: No  . Sexual Activity: Not on file   Other Topics Concern  . Not on file   Social History Narrative  . No narrative on file    Review of Systems  Constitutional: Negative for fever, chills, diaphoresis, activity change, appetite change and fatigue.  HENT: Negative for ear pain, nosebleeds, congestion, facial swelling, rhinorrhea, neck pain, neck stiffness and ear discharge.   Eyes: Negative for pain, discharge, redness, itching and visual disturbance.   Respiratory: Negative for cough, choking, chest tightness, shortness of breath, wheezing and stridor.   Cardiovascular: Negative for chest pain, palpitations and leg swelling.  Gastrointestinal: Negative for abdominal distention.  Genitourinary: Negative for dysuria, urgency, frequency, hematuria, flank pain, decreased urine volume, difficulty urinating and dyspareunia.  Musculoskeletal: Negative for back pain, joint swelling, arthralgias and gait problem.  Neurological: Negative for dizziness, tremors, seizures, syncope, facial asymmetry, speech difficulty, weakness, light-headedness, numbness and headaches.  Hematological: Negative for adenopathy. Does not bruise/bleed easily.  Psychiatric/Behavioral: Negative for hallucinations, behavioral problems, confusion, dysphoric mood, decreased concentration and agitation.    Objective:   Filed Vitals:   03/28/13 1650  BP: 143/88  Pulse: 88  Temp: 98.9 F (37.2 C)  Resp: 14    Physical Exam  Constitutional: Appears well-developed and well-nourished. No distress.  HENT: Legally blind. External right and left ear normal. Oropharynx is clear and moist.  Eyes: Conjunctivae and EOM are normal. PERRLA, no scleral icterus.  Neck: Normal ROM. Neck supple. No JVD. No tracheal deviation. No thyromegaly.  CVS: RRR, S1/S2 +, no murmurs, no gallops, no carotid bruit.  Pulmonary: Effort and breath sounds normal, no stridor, rhonchi, wheezes, rales.  Abdominal: Soft. BS +,  no distension, tenderness, rebound or guarding.  Musculoskeletal: Normal range of motion. No edema and no tenderness.  Lymphadenopathy: No lymphadenopathy noted, cervical, inguinal. Neuro: Alert. Normal reflexes, muscle tone coordination. No cranial nerve  deficit. Skin: Skin is warm and dry. No rash noted. Not diaphoretic. No erythema. No pallor.  Psychiatric: Normal mood and affect. Behavior, judgment, thought content normal.   Lab Results  Component Value Date   WBC 3.7*  12/08/2008   HGB 15.3 05/18/2010   HCT 45.0 05/18/2010   MCV 71.8* 12/08/2008   PLT 179 12/08/2008   Lab Results  Component Value Date   CREATININE 1.1 05/18/2010   BUN 13 05/18/2010   NA 137 05/18/2010   K 4.4 05/18/2010   CL 100 05/18/2010   CO2 27 09/10/2009    Lab Results  Component Value Date   HGBA1C 10.7 12/17/2009   Lipid Panel     Component Value Date/Time   CHOL 226* 05/01/2008 2043   TRIG 104 05/01/2008 2043   HDL 41 05/01/2008 2043   CHOLHDL 5.5 Ratio 05/01/2008 2043   VLDL 21 05/01/2008 2043   LDLCALC 164* 05/01/2008 2043       Assessment and plan:   Patient Active Problem List   Diagnosis Date Noted  . DIABETES MELLITUS II, UNCOMPLICATED 93/90/3009  . HYPERLIPIDEMIA 04/14/2006  . ALCOHOL ABUSE, UNSPECIFIED 04/14/2006       Diabetes mellitus insulin-dependent Repeat A1c His Accu-Cheks have been ranging from 250-300 We'll increase NPH 70/30-30 units in the morning and 30 units in evening Provided with a glucometer and glucose drips Patient already has an ophthalmologist that he sees at [facility] and he will continue to follow with him He is oriented diagnosed with diabetic retinopathy and is legally blind   Hypertension Renal panel today Lisinopril   Dyslipidemia Continue simvastatin Lipid panel   Establish care Tetanus booster Gastroenterology referral Routine colonoscopy   Followup in 6 weeks  The patient was given clear instructions to go to ER or return to medical center if symptoms don't improve, worsen or new problems develop. The patient verbalized understanding. The patient was told to call to get any lab results if not heard anything in the next week.

## 2013-03-28 NOTE — Progress Notes (Signed)
Patient is here to establish care. Patient is hypertension, a diabetic and has issues with cholesterol. Complains of pain in Lt arm and chest pain x1 years. Patient is legally blind. Possible arthritis in Lt arm.

## 2013-03-29 LAB — CBC WITH DIFFERENTIAL/PLATELET
Basophils Absolute: 0 K/uL (ref 0.0–0.1)
Basophils Relative: 0 % (ref 0–1)
Eosinophils Absolute: 0.1 K/uL (ref 0.0–0.7)
Eosinophils Relative: 2 % (ref 0–5)
HCT: 38.9 % — ABNORMAL LOW (ref 39.0–52.0)
Hemoglobin: 12.5 g/dL — ABNORMAL LOW (ref 13.0–17.0)
Lymphocytes Relative: 34 % (ref 12–46)
Lymphs Abs: 1.8 K/uL (ref 0.7–4.0)
MCH: 21.6 pg — ABNORMAL LOW (ref 26.0–34.0)
MCHC: 32.1 g/dL (ref 30.0–36.0)
MCV: 67.3 fL — ABNORMAL LOW (ref 78.0–100.0)
Monocytes Absolute: 0.5 K/uL (ref 0.1–1.0)
Monocytes Relative: 9 % (ref 3–12)
Neutro Abs: 2.9 K/uL (ref 1.7–7.7)
Neutrophils Relative %: 55 % (ref 43–77)
Platelets: 285 K/uL (ref 150–400)
RBC: 5.78 MIL/uL (ref 4.22–5.81)
RDW: 16.8 % — ABNORMAL HIGH (ref 11.5–15.5)
WBC: 5.3 K/uL (ref 4.0–10.5)

## 2013-03-29 LAB — VITAMIN D 25 HYDROXY (VIT D DEFICIENCY, FRACTURES): Vit D, 25-Hydroxy: 26 ng/mL — ABNORMAL LOW (ref 30–89)

## 2013-03-29 LAB — TSH: TSH: 1.863 u[IU]/mL (ref 0.350–4.500)

## 2013-03-30 MED ORDER — FERROUS SULFATE 325 (65 FE) MG PO TABS: 325.0000 mg | ORAL_TABLET | Freq: Every day | ORAL | Status: DC

## 2013-04-02 ENCOUNTER — Telehealth: Payer: Self-pay | Admitting: *Deleted

## 2013-04-02 NOTE — Telephone Encounter (Signed)
Message copied by Amiliah Campisi, [place] R on Mon Apr 02, 2013  9:16 AM ------      Message from: Allyson Sabal MD, Ascencion Dike      Created: Fri Mar 30, 2013  2:13 PM       Notify patient of the labs are normal except hemoglobin which is 12.5. Already called in a prescription for ferrous sulfate. Patient encouraged to follow up with gastroenterology for a colonoscopy given his anemia            Vitamin D level is low at 26, patient advised to take vitamin D 2000 international units twice a day ------

## 2013-04-02 NOTE — Telephone Encounter (Signed)
Left a message with relative for patient to give [place] a call back.

## 2013-05-09 ENCOUNTER — Encounter: Payer: Self-pay | Admitting: Internal Medicine

## 2013-05-09 ENCOUNTER — Ambulatory Visit: Payer: Medicaid Other | Attending: Internal Medicine | Admitting: Internal Medicine

## 2013-05-09 VITALS — BP 140/80 | HR 93 | Temp 97.9°F | Resp 16

## 2013-05-09 DIAGNOSIS — E119 Type 2 diabetes mellitus without complications: Secondary | ICD-10-CM

## 2013-05-09 DIAGNOSIS — R0989 Other specified symptoms and signs involving the circulatory and respiratory systems: Secondary | ICD-10-CM

## 2013-05-09 DIAGNOSIS — R0602 Shortness of breath: Secondary | ICD-10-CM

## 2013-05-09 DIAGNOSIS — I10 Essential (primary) hypertension: Secondary | ICD-10-CM

## 2013-05-09 DIAGNOSIS — R0609 Other forms of dyspnea: Secondary | ICD-10-CM

## 2013-05-09 DIAGNOSIS — R0789 Other chest pain: Secondary | ICD-10-CM

## 2013-05-09 DIAGNOSIS — R0600 Dyspnea, unspecified: Secondary | ICD-10-CM

## 2013-05-09 MED ORDER — INSULIN SYRINGES (DISPOSABLE) U-100 0.5 ML MISC
Status: DC
Start: 2013-05-09 — End: 2013-07-31

## 2013-05-09 MED ORDER — ACCU-CHEK SOFT TOUCH LANCETS MISC: Status: DC

## 2013-05-09 NOTE — Progress Notes (Signed)
Patient complains of having some shortness of breath and  Chest pains for the past three weeks Some pain to his left shoulder and upper as well

## 2013-05-09 NOTE — Progress Notes (Signed)
MRN: [id number] Name: Keith Holloway  Sex: male Age: 53 y.o. DOB: [birth year]  Allergies: Review of patient's allergies indicates no known allergies.  Chief Complaint  Patient presents with  . Shortness of Breath    HPI: Patient is 53 y.o. male who has history of diabetes, hypertension comes today for followup, he reported to have on and off chest pressure which happens even at rest and last for 10-15 minutes, patient her also reported to have orthopnea and PND, currently denies any headache dizziness chest pain or shortness of breath, EKG done in office shows normal sinus rhythm with LVH/left axis deviation, patient has been compliant in taking his diabetes medication he is taking Novolin 70/30  30 units twice a day and metformin 1 g twice a day, as per patient his fasting sugar is he still around 150 mg/dL.  Past Medical History  Diagnosis Date  . Diabetes mellitus without complication     History reviewed. No pertinent past surgical history.    Medication List       This list is accurate as of: 05/09/13  4:47 PM.  Always use your most recent med list.               aspirin 81 MG EC tablet  Take 81 mg by mouth daily.     ferrous sulfate 325 (65 FE) MG tablet  Take 1 tablet (325 mg total) by mouth daily with breakfast.     glucose blood test strip  Use as instructed     glucose monitoring kit monitoring kit  1 each by Does not apply route as needed for other.     insulin NPH-regular Human (70-30) 100 UNIT/ML injection  Commonly known as:  NOVOLIN 70/30  Inject 30 Units into the skin 2 (two) times daily with a meal.     lisinopril 20 MG tablet  Commonly known as:  PRINIVIL,ZESTRIL  Take 1 tablet (20 mg total) by mouth daily. For kidney protection     metFORMIN 1000 MG tablet  Commonly known as:  GLUCOPHAGE  Take 1 tablet (1,000 mg total) by mouth 2 (two) times daily.     simvastatin 40 MG tablet  Commonly known as:  ZOCOR  Take 1 tablet (40 mg total) by  mouth daily.        No orders of the defined types were placed in this encounter.    Immunization History  Administered Date(s) Administered  . Td 05/01/2008  . Tetanus 03/28/2013    History reviewed. No pertinent family history.  History  Substance Use Topics  . Smoking status: Never Smoker   . Smokeless tobacco: Not on file  . Alcohol Use: No    Review of Systems   As noted in HPI  Filed Vitals:   05/09/13 1638  BP: 140/80  Pulse:   Temp:   Resp:     Physical Exam  Physical Exam  Constitutional: No distress.  Neck: Neck supple.  Cardiovascular: Normal rate and regular rhythm.   Pulmonary/Chest: Breath sounds normal. No respiratory distress. He has no wheezes. He has no rales.  No reproducible chest pain with palpation  Abdominal: Soft. There is no tenderness.  Musculoskeletal: He exhibits no edema.    CBC    Component Value Date/Time   WBC 5.3 03/28/2013 1717   RBC 5.78 03/28/2013 1717   HGB 12.5* 03/28/2013 1717   HCT 38.9* 03/28/2013 1717   PLT 285 03/28/2013 1717   MCV 67.3* 03/28/2013 1717  LYMPHSABS 1.8 03/28/2013 1717   MONOABS 0.5 03/28/2013 1717   EOSABS 0.1 03/28/2013 1717   BASOSABS 0.0 03/28/2013 1717    CMP     Component Value Date/Time   NA 141 03/28/2013 1717   K 4.6 03/28/2013 1717   CL 102 03/28/2013 1717   CO2 28 03/28/2013 1717   GLUCOSE 156* 03/28/2013 1717   BUN 15 03/28/2013 1717   CREATININE 0.97 03/28/2013 1717   CREATININE 1.1 05/18/2010 1711   CALCIUM 9.4 03/28/2013 1717   PROT 7.2 03/28/2013 1717   ALBUMIN 4.3 03/28/2013 1717   AST 15 03/28/2013 1717   ALT 26 03/28/2013 1717   ALKPHOS 94 03/28/2013 1717   BILITOT 0.3 03/28/2013 1717   GFRNONAA >60 12/08/2008 1137   GFRAA  Value: >60        The eGFR has been calculated using the MDRD equation. This calculation has not been validated in all clinical situations. eGFR's persistently <60 mL/min signify possible Chronic Kidney Disease. 12/08/2008 1137    Lab Results  Component Value  Date/Time   CHOL 112 03/28/2013  5:17 PM    No components found with this basename: hga1c    Lab Results  Component Value Date/Time   AST 15 03/28/2013  5:17 PM    Assessment and Plan  Diabetes Hemoglobin A1c is trending down, I have advised patient to increase Novolin to 33 units twice a day and continue with metformin, keep the fingerstick log, continue with aspirin statin ACE inhibitor.  Essential hypertension, benign Repeat manual blood pressure is 140/80, have advised patient for DASH diet continue with lisinopril 20 mg daily  PND (paroxysmal nocturnal dyspnea) - Plan: Ambulatory referral to Cardiology  SOB (shortness of breath) - Plan: Ambulatory referral to Cardiology  Chest pressure - Plan: Ambulatory referral to Cardiology     Return in about 3 months (around 08/09/2013) for diabetes.  Lorayne Marek, MD

## 2013-05-09 NOTE — Patient Instructions (Signed)
Diabetes Meal Planning Guide The diabetes meal planning guide is a tool to help you plan your meals and snacks. It is important for people with diabetes to manage their blood glucose (sugar) levels. Choosing the right foods and the right amounts throughout your day will help control your blood glucose. Eating right can even help you improve your blood pressure and reach or maintain a healthy weight. CARBOHYDRATE COUNTING MADE EASY When you eat carbohydrates, they turn to sugar. This raises your blood glucose level. Counting carbohydrates can help you control this level so you feel better. When you plan your meals by counting carbohydrates, you can have more flexibility in what you eat and balance your medicine with your food intake. Carbohydrate counting simply means adding up the total amount of carbohydrate grams in your meals and snacks. Try to eat about the same amount at each meal. Foods with carbohydrates are listed below. Each portion below is 1 carbohydrate serving or 15 grams of carbohydrates. Ask your dietician how many grams of carbohydrates you should eat at each meal or snack. Grains and Starches  1 slice bread.   English muffin or hotdog/hamburger bun.   cup cold cereal (unsweetened).   cup cooked pasta or rice.   cup starchy vegetables (corn, potatoes, peas, beans, winter squash).  1 tortilla (6 inches).   bagel.  1 waffle or pancake (size of a CD).   cup cooked cereal.  4 to 6 small crackers. *Whole grain is recommended. Fruit  1 cup fresh unsweetened berries, melon, papaya, pineapple.  1 small fresh fruit.   banana or mango.   cup fruit juice (4 oz unsweetened).   cup canned fruit in natural juice or water.  2 tbs dried fruit.  12 to 15 grapes or cherries. Milk and Yogurt  1 cup fat-free or 1% milk.  1 cup soy milk.  6 oz light yogurt with sugar-free sweetener.  6 oz low-fat soy yogurt.  6 oz plain yogurt. Vegetables  1 cup raw or  cup  cooked is counted as 0 carbohydrates or a "free" food.  If you eat 3 or more servings at 1 meal, count them as 1 carbohydrate serving. Other Carbohydrates   oz chips or pretzels.   cup ice cream or frozen yogurt.   cup sherbet or sorbet.  2 inch square cake, no frosting.  1 tbs honey, sugar, jam, jelly, or syrup.  2 small cookies.  3 squares of graham crackers.  3 cups popcorn.  6 crackers.  1 cup broth-based soup.  Count 1 cup casserole or other mixed foods as 2 carbohydrate servings.  Foods with less than 20 calories in a serving may be counted as 0 carbohydrates or a "free" food. You may want to purchase a book or computer software that lists the carbohydrate gram counts of different foods. In addition, the nutrition facts panel on the labels of the foods you eat are a good source of this information. The label will tell you how big the serving size is and the total number of carbohydrate grams you will be eating per serving. Divide this number by 15 to obtain the number of carbohydrate servings in a portion. Remember, 1 carbohydrate serving equals 15 grams of carbohydrate. SERVING SIZES Measuring foods and serving sizes helps you make sure you are getting the right amount of food. The list below tells how big or small some common serving sizes are.  1 oz.........4 stacked dice.  3 oz.........Deck of cards.  1 tsp........Tip   of little finger.  1 tbs......Marland KitchenMarland KitchenThumb.  2 tbs.......Marland KitchenGolf ball.   cup......Marland KitchenHalf of a fist.  1 cup.......Marland KitchenA fist. SAMPLE DIABETES MEAL PLAN Below is a sample meal plan that includes foods from the grain and starches, dairy, vegetable, fruit, and meat groups. A dietician can individualize a meal plan to fit your calorie needs and tell you the number of servings needed from each food group. However, controlling the total amount of carbohydrates in your meal or snack is more important than making sure you include all of the food groups at every  meal. You may interchange carbohydrate containing foods (dairy, starches, and fruits). The meal plan below is an example of a 2000 calorie diet using carbohydrate counting. This meal plan has 17 carbohydrate servings. Breakfast  1 cup oatmeal (2 carb servings).   cup light yogurt (1 carb serving).  1 cup blueberries (1 carb serving).   cup almonds. Snack  1 large apple (2 carb servings).  1 low-fat string cheese stick. Lunch  Chicken breast salad.  1 cup spinach.   cup chopped tomatoes.  2 oz chicken breast, sliced.  2 tbs low-fat [place] dressing.  12 whole-wheat crackers (2 carb servings).  12 to 15 grapes (1 carb serving).  1 cup low-fat milk (1 carb serving). Snack  1 cup carrots.   cup hummus (1 carb serving). Dinner  3 oz broiled salmon.  1 cup brown rice (3 carb servings). Snack  1  cups steamed broccoli (1 carb serving) drizzled with 1 tsp olive oil and lemon juice.  1 cup light pudding (2 carb servings). DIABETES MEAL PLANNING WORKSHEET Your dietician can use this worksheet to help you decide how many servings of foods and what types of foods are right for you.  BREAKFAST Food Group and Servings / Carb Servings Grain/Starches __________________________________ Dairy __________________________________________ Vegetable ______________________________________ Fruit ___________________________________________ Meat __________________________________________ Fat ____________________________________________ LUNCH Food Group and Servings / Carb Servings Grain/Starches ___________________________________ Dairy ___________________________________________ Fruit ____________________________________________ Meat ___________________________________________ Fat _____________________________________________ Keith Holloway Food Group and Servings / Carb Servings Grain/Starches ___________________________________ Dairy  ___________________________________________ Fruit ____________________________________________ Meat ___________________________________________ Fat _____________________________________________ SNACKS Food Group and Servings / Carb Servings Grain/Starches ___________________________________ Dairy ___________________________________________ Vegetable _______________________________________ Fruit ____________________________________________ Meat ___________________________________________ Fat _____________________________________________ DAILY TOTALS Starches _________________________ Vegetable ________________________ Fruit ____________________________ Dairy ____________________________ Meat ____________________________ Fat ______________________________ Document Released: 10/29/2004 Document Revised: 04/26/2011 Document Reviewed: 09/09/2008 [employer] Patient Information 2014 [employer] DASH Diet The DASH diet stands for "Dietary Approaches to Stop Hypertension." It is a healthy eating plan that has been shown to reduce high blood pressure (hypertension) in as little as 14 days, while also possibly providing other significant health benefits. These other health benefits include reducing the risk of breast cancer after menopause and reducing the risk of type 2 diabetes, heart disease, colon cancer, and stroke. Health benefits also include weight loss and slowing kidney failure in patients with chronic kidney disease.  DIET GUIDELINES  Limit salt (sodium). Your diet should contain less than 1500 mg of sodium daily.  Limit refined or processed carbohydrates. Your diet should include mostly whole grains. Desserts and added sugars should be used sparingly.  Include small amounts of heart-healthy fats. These types of fats include nuts, oils, and tub margarine. Limit saturated and trans fats. These fats have been shown to be harmful in the body. CHOOSING FOODS  The following food groups  are based on a 2000 calorie diet. See your Registered Dietitian for individual calorie needs. Grains and Grain Products (6 to 8 servings daily)  Eat More Often:  Whole-wheat bread, brown rice, whole-grain or wheat pasta, quinoa, popcorn without added fat or salt (air popped).  Eat Less Often: White bread, white pasta, white rice, cornbread. Vegetables (4 to 5 servings daily)  Eat More Often: Fresh, frozen, and canned vegetables. Vegetables may be raw, steamed, roasted, or grilled with a minimal amount of fat.  Eat Less Often/Avoid: Creamed or fried vegetables. Vegetables in a cheese sauce. Fruit (4 to 5 servings daily)  Eat More Often: All fresh, canned (in natural juice), or frozen fruits. Dried fruits without added sugar. One hundred percent fruit juice ( cup [237 mL] daily).  Eat Less Often: Dried fruits with added sugar. Canned fruit in light or heavy syrup. [employer], Fish, and Poultry (2 servings or less daily. One serving is 3 to 4 oz [85-114 g]).  Eat More Often: Ninety percent or leaner ground beef, tenderloin, sirloin. Round cuts of beef, chicken breast, [place] breast. All fish. Grill, bake, or broil your meat. Nothing should be fried.  Eat Less Often/Avoid: Fatty cuts of meat, [place], or chicken leg, thigh, or wing. Fried cuts of meat or fish. Dairy (2 to 3 servings)  Eat More Often: Low-fat or fat-free milk, low-fat plain or light yogurt, reduced-fat or part-skim cheese.  Eat Less Often/Avoid: Milk (whole, 2%).Whole milk yogurt. Full-fat cheeses. Nuts, Seeds, and Legumes (4 to 5 servings per week)  Eat More Often: All without added salt.  Eat Less Often/Avoid: Salted nuts and seeds, canned beans with added salt. Fats and Sweets (limited)  Eat More Often: Vegetable oils, tub margarines without trans fats, sugar-free gelatin. Mayonnaise and salad dressings.  Eat Less Often/Avoid: Coconut oils, palm oils, butter, stick margarine, cream, half and half, cookies, candy,  pie. FOR MORE INFORMATION The Dash Diet Eating Plan: www.dashdiet.org Document Released: 01/21/2011 Document Revised: 04/26/2011 Document Reviewed: 01/21/2011 [facility] Patient Information 2014 [place], [place].

## 2013-05-10 DIAGNOSIS — R0789 Other chest pain: Secondary | ICD-10-CM | POA: Insufficient documentation

## 2013-05-10 DIAGNOSIS — R0600 Dyspnea, unspecified: Secondary | ICD-10-CM | POA: Insufficient documentation

## 2013-05-10 DIAGNOSIS — E119 Type 2 diabetes mellitus without complications: Secondary | ICD-10-CM | POA: Insufficient documentation

## 2013-05-16 ENCOUNTER — Ambulatory Visit (HOSPITAL_COMMUNITY)
Admission: RE | Admit: 2013-05-16 | Discharge: 2013-05-16 | Disposition: A | Discharge status: Home / Self Care | Payer: Medicaid Other | Source: Ambulatory Visit | Attending: Cardiology | Admitting: Cardiology

## 2013-05-16 ENCOUNTER — Ambulatory Visit: Payer: Medicaid Other | Attending: Cardiology | Admitting: Cardiology

## 2013-05-16 ENCOUNTER — Encounter: Payer: Self-pay | Admitting: Cardiology

## 2013-05-16 VITALS — BP 143/83 | HR 83 | Temp 98.3°F | Resp 16 | Ht 71.0 in | Wt 208.0 lb

## 2013-05-16 DIAGNOSIS — J984 Other disorders of lung: Secondary | ICD-10-CM | POA: Insufficient documentation

## 2013-05-16 DIAGNOSIS — E1110 Type 2 diabetes mellitus with ketoacidosis without coma: Secondary | ICD-10-CM

## 2013-05-16 DIAGNOSIS — E1310 Other specified diabetes mellitus with ketoacidosis without coma: Secondary | ICD-10-CM

## 2013-05-16 DIAGNOSIS — R0602 Shortness of breath: Secondary | ICD-10-CM | POA: Insufficient documentation

## 2013-05-16 DIAGNOSIS — E119 Type 2 diabetes mellitus without complications: Secondary | ICD-10-CM

## 2013-05-16 DIAGNOSIS — E785 Hyperlipidemia, unspecified: Secondary | ICD-10-CM | POA: Insufficient documentation

## 2013-05-16 DIAGNOSIS — R079 Chest pain, unspecified: Secondary | ICD-10-CM | POA: Insufficient documentation

## 2013-05-16 DIAGNOSIS — R0789 Other chest pain: Secondary | ICD-10-CM | POA: Insufficient documentation

## 2013-05-16 MED ORDER — NAPROXEN 500 MG PO TABS: 500.0000 mg | ORAL_TABLET | Freq: Two times a day (BID) | ORAL | Status: DC

## 2013-05-16 NOTE — Assessment & Plan Note (Signed)
This is clearly costochondritis. I've prescribed naproxen 500 mg twice a day for 2 weeks. He will also apply low heat. No cardiac workup necessary. I will obtain a chest x-ray PA and lateral with his history of smoking and a long time since he's had one.

## 2013-05-16 NOTE — Progress Notes (Signed)
Pt here to f/u with Dr. Verl Blalock for chest pain with normal EKG States sx has been intermit x 2 mnths Left chest wall pain,localized with increased throb pain deep breaths Denies cp today or sob BP- 143/83 83. Taking meds daily

## 2013-05-16 NOTE — Progress Notes (Signed)
HPI Keith Holloway is referred today for the evaluation of chest pain. He's had this for several months. It happens every day and can last for seconds to minutes. He does localize on both sides of his sternum. Sometimes radiate up into his neck. It is clearly worse when he takes a breath or when he lies down. He denies any fever, chills, or hemoptysis. He really does not have shortness of breath. He occasionally have some nausea doesn't hurt so bad but he does not have any reflux symptoms.  EKG in the office on March 25 showed normal sinus rhythm with low-voltage criteria for LVH.  His risk factors for coronary artery disease or vascular disease include age, hypertension, and diabetes. His last hemoglobin A1c was 8.0 and his insulin is being adjusted. He has hyperlipidemia but is well-controlled with simvastatin. He is a former smoker but quit 2 years ago. He has not had a chest x-ray in over 9 years.  Past Medical History  Diagnosis Date  . Diabetes mellitus without complication     Current Outpatient Prescriptions  Medication Sig Dispense Refill  . aspirin 81 MG EC tablet Take 81 mg by mouth daily.        . insulin NPH-regular Human (NOVOLIN 70/30) (70-30) 100 UNIT/ML injection Inject 30 Units into the skin 2 (two) times daily with a meal.  10 mL  11  . lisinopril (PRINIVIL,ZESTRIL) 20 MG tablet Take 1 tablet (20 mg total) by mouth daily. For kidney protection  60 tablet  2  . metFORMIN (GLUCOPHAGE) 1000 MG tablet Take 1 tablet (1,000 mg total) by mouth 2 (two) times daily.  60 tablet  12  . simvastatin (ZOCOR) 40 MG tablet Take 1 tablet (40 mg total) by mouth daily.  30 tablet  3  . ferrous sulfate 325 (65 FE) MG tablet Take 1 tablet (325 mg total) by mouth daily with breakfast.  60 tablet  3  . glucose blood test strip Use as instructed  100 each  12  . glucose monitoring kit (FREESTYLE) monitoring kit 1 each by Does not apply route as needed for other.  1 each  2  . Insulin Syringes,  Disposable, U-100 0.5 ML MISC Uses as directed  100 each  0  . Lancets (ACCU-CHEK SOFT TOUCH) lancets Use as instructed  100 each  12   No current facility-administered medications for this visit.    No Known Allergies  History reviewed. No pertinent family history.  History   Social History  . Marital Status: Married    Spouse Name: N/A    Number of Children: N/A  . Years of Education: N/A   Occupational History  . Not on file.   Social History Main Topics  . Smoking status: Former Smoker    Quit date: 05/17/2011  . Smokeless tobacco: Not on file  . Alcohol Use: Yes     Comment: states occassionally drinks beer  . Drug Use: No  . Sexual Activity: Not on file   Other Topics Concern  . Not on file   Social History Narrative  . 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 HEENT: symmetrical face, PERRLA,  Neck: no JVD, thyromegaly, or adenopathy, trachea midline Chest: symmetric without deformity, reproducible discomfort along both borders of the sternum at costo chondral junction Cardiac: PMI non-displaced, RRR, normal S1, S2, no gallop or murmur,  no rub is sitting up or lying down Lung: clear to ausculation and percussion Vascular: all pulses full without bruits  Abdominal: nondistended, nontender, good bowel sounds, no HSM, no bruits Extremities: no cyanosis, clubbing or edema, no sign of DVT, no varicosities  Skin: normal color, no rashes Neuro: alert and oriented x 3, non-focal Pysch: normal affect  EKG  BMET    Component Value Date/Time   NA 141 03/28/2013 1717   K 4.6 03/28/2013 1717   CL 102 03/28/2013 1717   CO2 28 03/28/2013 1717   GLUCOSE 156* 03/28/2013 1717   BUN 15 03/28/2013 1717   CREATININE 0.97 03/28/2013 1717   CREATININE 1.1 05/18/2010 1711   CALCIUM 9.4 03/28/2013 1717   GFRNONAA 89 03/28/2013 1717   GFRNONAA >60 12/08/2008 1137    GFRAA >89 03/28/2013 1717   GFRAA  Value: >60        The eGFR has been calculated using the MDRD equation. This calculation has not been validated in all clinical situations. eGFR's persistently <60 mL/min signify possible Chronic Kidney Disease. 12/08/2008 1137    Lipid Panel     Component Value Date/Time   CHOL 112 03/28/2013 1717   TRIG 117 03/28/2013 1717   HDL 33* 03/28/2013 1717   CHOLHDL 3.4 03/28/2013 1717   VLDL 23 03/28/2013 1717   LDLCALC 56 03/28/2013 1717    CBC    Component Value Date/Time   WBC 5.3 03/28/2013 1717   RBC 5.78 03/28/2013 1717   HGB 12.5* 03/28/2013 1717   HCT 38.9* 03/28/2013 1717   PLT 285 03/28/2013 1717   MCV 67.3* 03/28/2013 1717   MCH 21.6* 03/28/2013 1717   MCHC 32.1 03/28/2013 1717   RDW 16.8* 03/28/2013 1717   LYMPHSABS 1.8 03/28/2013 1717   MONOABS 0.5 03/28/2013 1717   EOSABS 0.1 03/28/2013 1717   BASOSABS 0.0 03/28/2013 1717

## 2013-05-23 ENCOUNTER — Telehealth: Payer: Self-pay | Admitting: Emergency Medicine

## 2013-05-23 NOTE — Telephone Encounter (Signed)
Left message for pt to return call.

## 2013-05-23 NOTE — Telephone Encounter (Signed)
Message copied by Ricci Barker on Wed May 23, 2013  6:03 PM ------      Message from: [place]: Wed May 23, 2013  9:13 AM       Stable. Please give patient and wife reassurance. ------

## 2013-06-19 ENCOUNTER — Encounter: Payer: Self-pay | Admitting: Internal Medicine

## 2013-07-04 ENCOUNTER — Encounter: Payer: Self-pay | Admitting: Internal Medicine

## 2013-07-31 ENCOUNTER — Encounter: Payer: Self-pay | Admitting: Internal Medicine

## 2013-07-31 ENCOUNTER — Ambulatory Visit: Payer: Medicaid Other | Attending: Internal Medicine | Admitting: Internal Medicine

## 2013-07-31 VITALS — BP 133/88 | HR 74 | Temp 98.8°F | Resp 16 | Wt 207.0 lb

## 2013-07-31 DIAGNOSIS — Z7982 Long term (current) use of aspirin: Secondary | ICD-10-CM | POA: Insufficient documentation

## 2013-07-31 DIAGNOSIS — Z79899 Other long term (current) drug therapy: Secondary | ICD-10-CM | POA: Insufficient documentation

## 2013-07-31 DIAGNOSIS — Z794 Long term (current) use of insulin: Secondary | ICD-10-CM | POA: Insufficient documentation

## 2013-07-31 DIAGNOSIS — Z87891 Personal history of nicotine dependence: Secondary | ICD-10-CM | POA: Insufficient documentation

## 2013-07-31 DIAGNOSIS — I10 Essential (primary) hypertension: Secondary | ICD-10-CM

## 2013-07-31 DIAGNOSIS — F1010 Alcohol abuse, uncomplicated: Secondary | ICD-10-CM | POA: Insufficient documentation

## 2013-07-31 DIAGNOSIS — E119 Type 2 diabetes mellitus without complications: Secondary | ICD-10-CM

## 2013-07-31 DIAGNOSIS — E785 Hyperlipidemia, unspecified: Secondary | ICD-10-CM | POA: Diagnosis not present

## 2013-07-31 DIAGNOSIS — R413 Other amnesia: Secondary | ICD-10-CM

## 2013-07-31 LAB — GLUCOSE, POCT (MANUAL RESULT ENTRY): POC GLUCOSE: 150 mg/dL — AB (ref 70–99)

## 2013-07-31 LAB — POCT GLYCOSYLATED HEMOGLOBIN (HGB A1C): HEMOGLOBIN A1C: 8

## 2013-07-31 MED ORDER — "INSULIN SYRINGE-NEEDLE U-100 31G X 5/16"" 0.3 ML MISC": Status: DC

## 2013-07-31 MED ORDER — ACCU-CHEK SOFT TOUCH LANCETS MISC: Status: DC

## 2013-07-31 MED ORDER — INSULIN SYRINGES (DISPOSABLE) U-100 0.5 ML MISC: Status: DC

## 2013-07-31 NOTE — Progress Notes (Signed)
Patient here with family Complains of having memory loss for a little over a month

## 2013-07-31 NOTE — Progress Notes (Signed)
MRN: [id number] Name: Keith Holloway  Sex: male Age: 53 y.o. DOB: [birth year]  Allergies: Review of patient's allergies indicates no known allergies.  Chief Complaint  Patient presents with  . Memory Loss    HPI: Patient is 53 y.o. male who has to of diabetes, hypertension, hyperlipidemia comes today for followup accompanied with family members, reported to have memory issues for the last one month he keeps forgetting, as per family he has also been drinking a lot he started drinking for the first time when he was 53 years old, patient denies any fever chills numbness weakness, history of anemia, he recently had a colonoscopy done in [place] which reported to be normal, patient is advised her to take iron supplements, as per family his brother  has Alzheimer's disease, patient's blood pressure is well controlled, previously had thyroid test done which was normal. For diabetes patient has been taking metformin and Novolin 70/3033 units twice a day, he denies any hypoglycemic symptoms usually his fasting sugar is between 90-1 30 mg/dL, his hemoglobin A1c today is 8.0%.  Past Medical History  Diagnosis Date  . Diabetes mellitus without complication     History reviewed. No pertinent past surgical history.    Medication List       This list is accurate as of: 07/31/13  3:11 PM.  Always use your most recent med list.               accu-chek soft touch lancets  Use as instructed     aspirin 81 MG EC tablet  Take 81 mg by mouth daily.     ferrous sulfate 325 (65 FE) MG tablet  Take 1 tablet (325 mg total) by mouth daily with breakfast.     glucose blood test strip  Use as instructed     glucose monitoring kit monitoring kit  1 each by Does not apply route as needed for other.     insulin NPH-regular Human (70-30) 100 UNIT/ML injection  Commonly known as:  NOVOLIN 70/30  Inject 30 Units into the skin 2 (two) times daily with a meal.     Insulin Syringes  (Disposable) U-100 0.5 ML Misc  Uses as directed     lisinopril 20 MG tablet  Commonly known as:  PRINIVIL,ZESTRIL  Take 1 tablet (20 mg total) by mouth daily. For kidney protection     metFORMIN 1000 MG tablet  Commonly known as:  GLUCOPHAGE  Take 1 tablet (1,000 mg total) by mouth 2 (two) times daily.     naproxen 500 MG tablet  Commonly known as:  NAPROSYN  Take 1 tablet (500 mg total) by mouth 2 (two) times daily with a meal.     simvastatin 40 MG tablet  Commonly known as:  ZOCOR  Take 1 tablet (40 mg total) by mouth daily.        Meds ordered this encounter  Medications  . Insulin Syringes, Disposable, U-100 0.5 ML MISC    Sig: Uses as directed    Dispense:  100 each    Refill:  1  . Lancets (ACCU-CHEK SOFT TOUCH) lancets    Sig: Use as instructed    Dispense:  100 each    Refill:  12    Immunization History  Administered Date(s) Administered  . Td 05/01/2008  . Tetanus 03/28/2013    History reviewed. No pertinent family history.  History  Substance Use Topics  . Smoking status: Former Smoker    Quit date: 05/17/2011  .  Smokeless tobacco: Not on file  . Alcohol Use: Yes     Comment: states occassionally drinks beer    Review of Systems   As noted in HPI  Filed Vitals:   07/31/13 1442  BP: 133/88  Pulse: 74  Temp: 98.8 F (37.1 C)  Resp: 16    Physical Exam  Physical Exam  Constitutional: He is oriented to person, place, and time. No distress.  Eyes: EOM are normal. Pupils are equal, round, and reactive to light.  Neck: Neck supple.  Cardiovascular: Normal rate and regular rhythm.   Pulmonary/Chest: Breath sounds normal. No respiratory distress. He has no wheezes. He has no rales.  Neurological: He is alert and oriented to person, place, and time.    CBC    Component Value Date/Time   WBC 5.3 03/28/2013 1717   RBC 5.78 03/28/2013 1717   HGB 12.5* 03/28/2013 1717   HCT 38.9* 03/28/2013 1717   PLT 285 03/28/2013 1717   MCV 67.3*  03/28/2013 1717   LYMPHSABS 1.8 03/28/2013 1717   MONOABS 0.5 03/28/2013 1717   EOSABS 0.1 03/28/2013 1717   BASOSABS 0.0 03/28/2013 1717    CMP     Component Value Date/Time   NA 141 03/28/2013 1717   K 4.6 03/28/2013 1717   CL 102 03/28/2013 1717   CO2 28 03/28/2013 1717   GLUCOSE 156* 03/28/2013 1717   BUN 15 03/28/2013 1717   CREATININE 0.97 03/28/2013 1717   CREATININE 1.1 05/18/2010 1711   CALCIUM 9.4 03/28/2013 1717   PROT 7.2 03/28/2013 1717   ALBUMIN 4.3 03/28/2013 1717   AST 15 03/28/2013 1717   ALT 26 03/28/2013 1717   ALKPHOS 94 03/28/2013 1717   BILITOT 0.3 03/28/2013 1717   GFRNONAA 89 03/28/2013 1717   GFRNONAA >60 12/08/2008 1137   GFRAA >89 03/28/2013 1717   GFRAA  Value: >60        The eGFR has been calculated using the MDRD equation. This calculation has not been validated in all clinical situations. eGFR's persistently <60 mL/min signify possible Chronic Kidney Disease. 12/08/2008 1137    Lab Results  Component Value Date/Time   CHOL 112 03/28/2013  5:17 PM    No components found with this basename: hga1c    Lab Results  Component Value Date/Time   AST 15 03/28/2013  5:17 PM    Assessment and Plan  DM (diabetes mellitus) - Plan:  Results for orders placed in visit on 07/31/13  GLUCOSE, POCT (MANUAL RESULT ENTRY)      Result Value Ref Range   POC Glucose 150 (*) 70 - 99 mg/dl  POCT GLYCOSYLATED HEMOGLOBIN (HGB A1C)      Result Value Ref Range   Hemoglobin A1C 8.0     Have advised patient to increase the dose of insulin to 34 units twice a day, continue with metformin, continue with fingerstick monitoring at home.  , Ambulatory referral to Ophthalmology, Insulin Syringes, Disposable, U-100 0.5 ML MISC, Lancets (ACCU-CHEK SOFT TOUCH) lancets  Essential hypertension, benign Blood pressure is well controlled continue with lisinopril 20 mg daily.  Other and unspecified hyperlipidemia Continue with Zocor we'll recheck lipid panel on the next visit  Alcohol  abuse I have counseled patient to cut down on drinking which may be contributing to his memory symptoms.  Memory disorder - Plan: I have referred him to neurology for further evaluation, he has family history of Alzheimer's disease, will check vitamin B12 and ordered CT head  Ambulatory referral to  Neurology, Vitamin B12, CT Head Wo Contrast   Health Maintenance -Colonoscopy: Up-to-date with colonoscopy   Return in about 3 months (around 10/31/2013) for diabetes, hypertension, hyperipidemia.  Lorayne Marek, MD

## 2013-07-31 NOTE — Patient Instructions (Signed)

## 2013-08-01 LAB — VITAMIN B12: Vitamin B-12: 583 pg/mL (ref 211–911)

## 2013-08-03 ENCOUNTER — Ambulatory Visit (HOSPITAL_COMMUNITY): Payer: Medicaid Other

## 2013-08-03 ENCOUNTER — Telehealth: Payer: Self-pay | Admitting: Neurology

## 2013-08-03 ENCOUNTER — Ambulatory Visit: Payer: Medicaid Other | Admitting: Neurology

## 2013-08-03 NOTE — Telephone Encounter (Signed)
This patient did not show for a new patient appointment today. 

## 2013-08-22 ENCOUNTER — Encounter: Payer: Self-pay | Admitting: Neurology

## 2013-08-22 ENCOUNTER — Ambulatory Visit (INDEPENDENT_AMBULATORY_CARE_PROVIDER_SITE_OTHER): Payer: Medicaid Other | Admitting: Neurology

## 2013-08-22 VITALS — BP 127/84 | HR 88 | Ht 71.0 in | Wt 204.0 lb

## 2013-08-22 DIAGNOSIS — R413 Other amnesia: Secondary | ICD-10-CM | POA: Insufficient documentation

## 2013-08-22 HISTORY — DX: Other amnesia: R41.3

## 2013-08-22 NOTE — Progress Notes (Signed)
Reason for visit: Memory disturbance  Keith Holloway is a 53 y.o. 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 The patient reports a two-year history of some issues with memory. The patient has a history of alcohol abuse, drinking up to 15 beers daily, and he has had associated alcoholic blackouts in the past. The patient has had a short-term memory issue, having difficulty remembering recent events. The patient has restrictions of activity of daily living mainly because of his blindness. He does not operate a motor vehicle, and he is unable to read. He has difficulty keeping up with appointments and medications. He has history of some marijuana use and cocaine abuse in the past as well. The patient was incarcerated until January of 2015. The patient is on probation currently, wearing ankle braces. The patient still tries to obtain beer to drink. The patient indicates for the last several years, dating back to 2012, he has had problems with auditory hallucinations. He will hear voices that tell him to do things, and he believes that he is invisible, and the voices will "watch out for him". The patient has been seen through mental health previously, and he was evaluated at [facility] in the past, but never hospitalized for extended periods of time. He has had no psychiatric followup recently. He has never been on medications for the auditory hallucinations. He has in the past had visual hallucinations, but not recently. He denies any focal numbness or weakness of the face, arms, or legs with the exception of numbness of the feet associated with his diabetic peripheral neuropathy. He believes that his legs are weak at times when he is going upstairs. He denies problems controlling the bowels or the bladder, and he does report occasional headache. He has a history of a closed head  injury when he was in his 71s associated with loss of consciousness for 4 days. He has a brother around age 63 with Alzheimer's disease. He is sent to this office for an evaluation. Recent blood work that includes a vitamin B12 level and thyroid profile were unremarkable.  Past Medical History  Diagnosis Date  . Diabetes mellitus without complication   . Hypertension   . High cholesterol   . Migraine   . Depression   . Arthritis   . Alcohol abuse   . Polysubstance abuse     alcohol and cocaine  . Polyneuropathy in diabetes(357.2)   . Memory disorder 08/22/2013    Past Surgical History  Procedure Laterality Date  . Eye surgery  04/2012    detatched retna, bilateral  . Eye surgery  08/2012  . Cataract extraction      bilaterally    Family History  Problem Relation Age of Onset  . Alzheimer's disease Brother   . Cancer Father   . Hypertension Father   . Heart attack Father   . Hypertension Mother   . Gout Mother   . Diabetes Sister   . Hypertension Sister   . Hypertension Sister   . Hypertension Sister     Social history:  reports that he quit smoking about 2 years ago. He does not have any smokeless tobacco history on file. He reports that he drinks alcohol. He reports that he does not use illicit drugs.  Medications:  Current Outpatient Prescriptions on File Prior to Visit  Medication Sig Dispense Refill  . aspirin 81 MG  EC tablet Take 81 mg by mouth daily.        . ferrous sulfate 325 (65 FE) MG tablet Take 1 tablet (325 mg total) by mouth daily with breakfast.  60 tablet  3  . glucose blood test strip Use as instructed  100 each  12  . glucose monitoring kit (FREESTYLE) monitoring kit 1 each by Does not apply route as needed for other.  1 each  2  . insulin NPH-regular Human (NOVOLIN 70/30) (70-30) 100 UNIT/ML injection Inject 30 Units into the skin 2 (two) times daily with a meal.  10 mL  11  . Insulin Syringe-Needle U-100 (B-D INSULIN SYRINGE) 31G X 5/16" 0.3 ML  MISC As directed  100 each  2  . Insulin Syringes, Disposable, U-100 0.5 ML MISC Uses as directed  100 each  1  . Lancets (ACCU-CHEK SOFT TOUCH) lancets Use as instructed  100 each  12  . lisinopril (PRINIVIL,ZESTRIL) 20 MG tablet Take 1 tablet (20 mg total) by mouth daily. For kidney protection  60 tablet  2  . metFORMIN (GLUCOPHAGE) 1000 MG tablet Take 1 tablet (1,000 mg total) by mouth 2 (two) times daily.  60 tablet  12  . naproxen (NAPROSYN) 500 MG tablet Take 1 tablet (500 mg total) by mouth 2 (two) times daily with a meal.  60 tablet  0  . simvastatin (ZOCOR) 40 MG tablet Take 1 tablet (40 mg total) by mouth daily.  30 tablet  3   No current facility-administered medications on file prior to visit.     No Known Allergies  ROS:  Out of a complete 14 system review of symptoms, the patient complains only of the following symptoms, and all other reviewed systems are negative.  Fevers, chills, fatigue Hearing loss Blurred vision, loss of vision Joint pain, muscle cramps, achy muscles Runny nose Memory loss, confusion, headache, numbness, weakness, slurred speech, dizziness, passing out Depression, anxiety, too much sleep, decreased energy, disinterest in activities, racing thoughts Sleepiness, snoring, restless legs  Blood pressure 127/84, pulse 88, height $RemoveBe'5\' 11"'BTjUPSWvg$  (1.803 m), weight 204 lb (92.534 kg).  Physical Exam  General: The patient is alert and cooperative at the time of the examination.  Eyes: Pupils are equal, round, and reactive to light. Discs are poorly visualized bilaterally.  Neck: The neck is supple, no carotid bruits are noted.  Respiratory: The respiratory examination is clear.  Cardiovascular: The cardiovascular examination reveals a regular rate and rhythm, no obvious murmurs or rubs are noted.  Skin: Extremities are without significant edema.  Neurologic Exam  Mental status: The Mini-Mental status examination could not be fully completed secondary to  blindness. Score is 19/27.  Cranial nerves: Facial symmetry is present. There is good sensation of the face to pinprick and soft touch bilaterally. The strength of the facial muscles and the muscles to head turning and shoulder shrug are normal bilaterally. Speech is well enunciated, no aphasia or dysarthria is noted. Extraocular movements are full. Visual fields are restricted, the patient has count fingers only. The tongue is midline, and the patient has symmetric elevation of the soft palate. No obvious hearing deficits are noted.  Motor: The motor testing reveals 5 over 5 strength of all 4 extremities. Good symmetric motor tone is noted throughout.  Sensory: Sensory testing is intact to pinprick, soft touch, vibration sensation, and position sense on all 4 extremities , with the exception that there is a stocking pattern pinprick sensory deficit two thirds the way up  the legs bilaterally. No evidence of extinction is noted.  Coordination: Cerebellar testing reveals good finger-nose-finger and heel-to-shin bilaterally.  Gait and station: Gait is normal. Tandem gait is normal. Romberg is negative. No drift is seen.  Reflexes: Deep tendon reflexes are symmetric, but are depressed bilaterally. Toes are downgoing bilaterally.   Assessment/Plan:  1. Memory disturbance  2. History of visual and auditory hallucinations  3. Diabetes  4. Blindness  5. History of alcohol abuse  The patient reports a two-year history of a memory disturbance, but this comes in conjunction with a history of auditory and visual hallucinations that suggests an underlying psychiatric disease. The patient will be sent for MRI evaluation of the brain, and further blood work. If the studies are unrevealing, the patient should be referred to mental health to help obtain a psychiatric diagnosis. The patient does have a history of alcohol abuse in the past. The patient will followup in about 4 months.  Jill Alexanders  MD 08/22/2013 9:12 PM  [facility] [address] [place], [place] [address]  Phone [phone] Fax [phone]

## 2013-08-24 LAB — COPPER, SERUM: Copper: 114 ug/dL (ref 72–166)

## 2013-08-24 LAB — HIV ANTIBODY (ROUTINE TESTING W REFLEX): HIV-1/HIV-2 Ab: NONREACTIVE

## 2013-08-24 LAB — RPR: SYPHILIS RPR SCR: NONREACTIVE

## 2013-10-29 ENCOUNTER — Ambulatory Visit: Payer: Medicaid Other | Admitting: Internal Medicine

## 2013-12-24 ENCOUNTER — Ambulatory Visit: Payer: Medicaid Other | Admitting: Adult Health

## 2013-12-24 ENCOUNTER — Telehealth: Payer: Self-pay | Admitting: Adult Health

## 2013-12-24 NOTE — Telephone Encounter (Signed)
Patient no showed for a revisit appointment.  

## 2013-12-25 ENCOUNTER — Encounter: Payer: Self-pay | Admitting: *Deleted

## 2014-01-21 ENCOUNTER — Encounter: Payer: Self-pay | Admitting: Adult Health

## 2014-01-21 ENCOUNTER — Ambulatory Visit (INDEPENDENT_AMBULATORY_CARE_PROVIDER_SITE_OTHER): Payer: Medicaid Other | Admitting: Adult Health

## 2014-01-21 VITALS — BP 145/83 | HR 90 | Temp 97.3°F | Ht 71.0 in | Wt 196.0 lb

## 2014-01-21 DIAGNOSIS — F1010 Alcohol abuse, uncomplicated: Secondary | ICD-10-CM

## 2014-01-21 DIAGNOSIS — R413 Other amnesia: Secondary | ICD-10-CM

## 2014-01-21 NOTE — Patient Instructions (Signed)
STOP drinking alcohol. Start attending [facility] meetings. Please start the medication that your psychiatrist started providing that you are not drinking alcohol. [facility] will be calling you to set up your MRI of the brain.

## 2014-01-21 NOTE — Progress Notes (Signed)
I have read the note, and I agree with the clinical assessment and plan.  [name]   

## 2014-01-21 NOTE — Progress Notes (Signed)
PATIENT: Keith Holloway DOB: February 04, 1961  REASON FOR VISIT: follow up HISTORY FROM: patient  HISTORY OF PRESENT ILLNESS: Keith Holloway is a 53 year old male with a history of memory loss, visual and auditory hallucinations and ETOH abuse. He returns today for follow-up. An MRI of the brain was ordered. Patient never had this done stating that he missed it and needs to reschedule. The patient feels that his memory is getting worse since the last visit. The patient states that he doesn't remember his doctors visits and appointments. His wife has to keep up with all his appointments. He states that if he sits something down he will forget where it put it. He will forget to take his medicine if his wife does not remind him. He is able to complete ADLs independently. He is seeing a Teacher, music at [employer]. He has stopped drinking ETOH for four months but this past Saturday he drank one beer while watching a football game.  He states that since he stopped drinking he has not had any additional visual or auditory hallucinations. the wife reports that his psychiatrist  started him on an antidepressant medication as well as a medication for schizophrenia. However he did not start the medications because he was still drinking at that time.  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 The patient reports a two-year history of some issues with memory. The patient has a history of alcohol abuse, drinking up to 15 beers daily, and he has had associated alcoholic blackouts in the past. The patient has had a short-term memory issue, having difficulty remembering recent events. The patient has restrictions of activity of daily living mainly because of his blindness. He does not operate a motor vehicle, and he is unable to read. He has difficulty keeping up with appointments and medications. He has history of some  marijuana use and cocaine abuse in the past as well. The patient was incarcerated until January of 2015. The patient is on probation currently, wearing ankle braces. The patient still tries to obtain beer to drink. The patient indicates for the last several years, dating back to 2012, he has had problems with auditory hallucinations. He will hear voices that tell him to do things, and he believes that he is invisible, and the voices will "watch out for him". The patient has been seen through mental health previously, and he was evaluated at [facility] in the past, but never hospitalized for extended periods of time. He has had no psychiatric followup recently. He has never been on medications for the auditory hallucinations. He has in the past had visual hallucinations, but not recently. He denies any focal numbness or weakness of the face, arms, or legs with the exception of numbness of the feet associated with his diabetic peripheral neuropathy. He believes that his legs are weak at times when he is going upstairs. He denies problems controlling the bowels or the bladder, and he does report occasional headache. He has a history of a closed head injury when he was in his 23s associated with loss of consciousness for 4 days. He has a brother around age 21 with Alzheimer's disease. He is sent to this office for an evaluation. Recent blood work that includes a vitamin B12 level and thyroid profile were unremarkable.  REVIEW OF SYSTEMS: Out of a complete 14 system review of symptoms, the patient complains only of the following symptoms, and all other reviewed  systems are negative.  ALLERGIES: No Known Allergies  HOME MEDICATIONS: Outpatient Prescriptions Prior to Visit  Medication Sig Dispense Refill  . aspirin 81 MG EC tablet Take 81 mg by mouth daily.      Marland Kitchen glucose blood test strip Use as instructed 100 each 12  . glucose monitoring kit (FREESTYLE) monitoring kit 1 each by Does not apply route as needed for  other. 1 each 2  . insulin NPH-regular Human (NOVOLIN 70/30) (70-30) 100 UNIT/ML injection Inject 30 Units into the skin 2 (two) times daily with a meal. 10 mL 11  . Insulin Syringe-Needle U-100 (B-D INSULIN SYRINGE) 31G X 5/16" 0.3 ML MISC As directed 100 each 2  . Insulin Syringes, Disposable, U-100 0.5 ML MISC Uses as directed 100 each 1  . Lancets (ACCU-CHEK SOFT TOUCH) lancets Use as instructed 100 each 12  . lisinopril (PRINIVIL,ZESTRIL) 20 MG tablet Take 1 tablet (20 mg total) by mouth daily. For kidney protection 60 tablet 2  . metFORMIN (GLUCOPHAGE) 1000 MG tablet Take 1 tablet (1,000 mg total) by mouth 2 (two) times daily. 60 tablet 12  . naproxen (NAPROSYN) 500 MG tablet Take 1 tablet (500 mg total) by mouth 2 (two) times daily with a meal. 60 tablet 0  . simvastatin (ZOCOR) 40 MG tablet Take 1 tablet (40 mg total) by mouth daily. 30 tablet 3  . ferrous sulfate 325 (65 FE) MG tablet Take 1 tablet (325 mg total) by mouth daily with breakfast. (Patient not taking: Reported on 01/21/2014) 60 tablet 3   No facility-administered medications prior to visit.    PAST MEDICAL HISTORY: Past Medical History  Diagnosis Date  . Diabetes mellitus without complication   . Hypertension   . High cholesterol   . Migraine   . Depression   . Arthritis   . Alcohol abuse   . Polysubstance abuse     alcohol and cocaine  . Polyneuropathy in diabetes(357.2)   . Memory disorder 08/22/2013    PAST SURGICAL HISTORY: Past Surgical History  Procedure Laterality Date  . Eye surgery  04/2012    detatched retna, bilateral  . Eye surgery  08/2012  . Cataract extraction      bilaterally    FAMILY HISTORY: Family History  Problem Relation Age of Onset  . Alzheimer's disease Brother   . Cancer Father   . Hypertension Father   . Heart attack Father   . Hypertension Mother   . Gout Mother   . Diabetes Sister   . Hypertension Sister   . Hypertension Sister   . Hypertension Sister     SOCIAL  HISTORY: History   Social History  . Marital Status: Married    Spouse Name: Keith Holloway    Number of Children: 0  . Years of Education: 11   Occupational History  . Disabled    Social History Main Topics  . Smoking status: Former Smoker    Quit date: 04/16/2011  . Smokeless tobacco: Not on file  . Alcohol Use: Yes     Comment: states occassionally drinks beer  . Drug Use: No  . Sexual Activity: Not on file   Other Topics Concern  . Not on file   Social History Narrative   Patient is married Keith Holloway) and lives at home with his wife.   Patient is disabled.   Patient has a high school education.   Patient is right-handed.   Patient drinks 3-4 cups of coffee most days.  PHYSICAL EXAM  Filed Vitals:   01/21/14 0820  BP: 145/83  Pulse: 90  Temp: 97.3 F (36.3 C)  TempSrc: Oral  Height: $Remove'5\' 11"'bbSlTbh$  (1.803 m)  Weight: 196 lb (88.905 kg)   Body mass index is 27.35 kg/(m^2).  Generalized: Well developed, in no acute distress   Neurological examination  Mentation: Alert oriented to time, place, history taking. Follows all commands speech and language fluent. MMSE 22/27 Cranial nerve II-XII: Pupils were equal round reactive to light. Extraocular movements were full. Patient is legally blind. Facial sensation and strength were normal. Uvula tongue midline. Head turning and shoulder shrug  were normal and symmetric. Motor: The motor testing reveals 5 over 5 strength of all 4 extremities. Good symmetric motor tone is noted throughout.  Sensory: Sensory testing is intact to soft touch on all 4 extremities. No evidence of extinction is noted.  Coordination: Cerebellar testing reveals good finger-nose-finger and heel-to-shin bilaterally.  Gait and station: Gait is normal. Tandem gait is unsteady. Romberg is negative. No drift is seen.  Reflexes: Deep tendon reflexes are symmetric but depressed.    DIAGNOSTIC DATA (LABS, IMAGING, TESTING) - I reviewed patient records, labs,  notes, testing and imaging myself where available.  Lab Results  Component Value Date   WBC 5.3 03/28/2013   HGB 12.5* 03/28/2013   HCT 38.9* 03/28/2013   MCV 67.3* 03/28/2013   PLT 285 03/28/2013      Component Value Date/Time   NA 141 03/28/2013 1717   K 4.6 03/28/2013 1717   CL 102 03/28/2013 1717   CO2 28 03/28/2013 1717   GLUCOSE 156* 03/28/2013 1717   BUN 15 03/28/2013 1717   CREATININE 0.97 03/28/2013 1717   CREATININE 1.1 05/18/2010 1711   CALCIUM 9.4 03/28/2013 1717   PROT 7.2 03/28/2013 1717   ALBUMIN 4.3 03/28/2013 1717   AST 15 03/28/2013 1717   ALT 26 03/28/2013 1717   ALKPHOS 94 03/28/2013 1717   BILITOT 0.3 03/28/2013 1717   GFRNONAA 89 03/28/2013 1717   GFRNONAA >60 12/08/2008 1137   GFRAA >89 03/28/2013 1717   GFRAA  12/08/2008 1137    >60        The eGFR has been calculated using the MDRD equation. This calculation has not been validated in all clinical situations. eGFR's persistently <60 mL/min signify possible Chronic Kidney Disease.   Lab Results  Component Value Date   CHOL 112 03/28/2013   HDL 33* 03/28/2013   LDLCALC 56 03/28/2013   LDLDIRECT 105* 09/10/2009   TRIG 117 03/28/2013   CHOLHDL 3.4 03/28/2013   Lab Results  Component Value Date   HGBA1C 8.0 07/31/2013   Lab Results  Component Value Date   VITAMINB12 583 07/31/2013   Lab Results  Component Value Date   TSH 1.863 03/28/2013      ASSESSMENT AND PLAN 53 y.o. year old male  has a past medical history of Diabetes mellitus without complication; Hypertension; High cholesterol; Migraine; Depression; Arthritis; Alcohol abuse; Polysubstance abuse; Polyneuropathy in diabetes(357.2); and Memory disorder (08/22/2013). here with:  1. Memory loss 2. Alcohol abuse  I have encouraged the patient to participate in [place] meetings. His wife states that she is going to sign him up for this again. The patient never had his MRI completed. I would like him to have the MRI of the brain to  look for any acute changes that would contribute to memory loss. I also encouraged the patient to speak to his psychiatrist about starting the medications that  he prescribed providing that he no longer drinks alcohol. His memory score has improved slightly since the last visit. His visual and auditory hallucinations has also improved since he decreased his alcohol intake. Patient verbalized that he understands the importance of stopping alcohol. I will call the patient with his MRI results once they are available to me. His memory loss may improve with adequate treatment of his psychiatric disease as well as his depression. The patient will follow-up in 4 months or sooner if needed.   Ward Givens, MSN, NP-C 01/21/2014, 8:40 AM [facility] [address], [place], [place] [address] [phone]  Note: This document was prepared with digital dictation and possible smart phrase technology. Any transcriptional errors that result from this process are unintentional.

## 2014-01-23 ENCOUNTER — Ambulatory Visit: Payer: Medicaid Other | Attending: Internal Medicine | Admitting: Internal Medicine

## 2014-01-23 ENCOUNTER — Encounter: Payer: Self-pay | Admitting: Internal Medicine

## 2014-01-23 ENCOUNTER — Ambulatory Visit (HOSPITAL_BASED_OUTPATIENT_CLINIC_OR_DEPARTMENT_OTHER): Payer: Medicaid Other

## 2014-01-23 VITALS — BP 160/90 | HR 79 | Temp 97.0°F | Resp 16 | Wt 192.0 lb

## 2014-01-23 DIAGNOSIS — Z23 Encounter for immunization: Secondary | ICD-10-CM | POA: Diagnosis not present

## 2014-01-23 DIAGNOSIS — Z87891 Personal history of nicotine dependence: Secondary | ICD-10-CM | POA: Insufficient documentation

## 2014-01-23 DIAGNOSIS — Z79899 Other long term (current) drug therapy: Secondary | ICD-10-CM | POA: Insufficient documentation

## 2014-01-23 DIAGNOSIS — E780 Pure hypercholesterolemia: Secondary | ICD-10-CM | POA: Diagnosis not present

## 2014-01-23 DIAGNOSIS — F329 Major depressive disorder, single episode, unspecified: Secondary | ICD-10-CM | POA: Diagnosis not present

## 2014-01-23 DIAGNOSIS — E118 Type 2 diabetes mellitus with unspecified complications: Secondary | ICD-10-CM | POA: Insufficient documentation

## 2014-01-23 DIAGNOSIS — Z794 Long term (current) use of insulin: Secondary | ICD-10-CM | POA: Diagnosis not present

## 2014-01-23 DIAGNOSIS — Z9841 Cataract extraction status, right eye: Secondary | ICD-10-CM | POA: Diagnosis not present

## 2014-01-23 DIAGNOSIS — E785 Hyperlipidemia, unspecified: Secondary | ICD-10-CM

## 2014-01-23 DIAGNOSIS — E139 Other specified diabetes mellitus without complications: Secondary | ICD-10-CM | POA: Insufficient documentation

## 2014-01-23 DIAGNOSIS — Z7982 Long term (current) use of aspirin: Secondary | ICD-10-CM | POA: Diagnosis not present

## 2014-01-23 DIAGNOSIS — I10 Essential (primary) hypertension: Secondary | ICD-10-CM | POA: Insufficient documentation

## 2014-01-23 DIAGNOSIS — R030 Elevated blood-pressure reading, without diagnosis of hypertension: Secondary | ICD-10-CM

## 2014-01-23 DIAGNOSIS — Z9842 Cataract extraction status, left eye: Secondary | ICD-10-CM | POA: Insufficient documentation

## 2014-01-23 DIAGNOSIS — IMO0001 Reserved for inherently not codable concepts without codable children: Secondary | ICD-10-CM

## 2014-01-23 LAB — COMPLETE METABOLIC PANEL WITH GFR
ALBUMIN: 4.4 g/dL (ref 3.5–5.2)
ALT: 28 U/L (ref 0–53)
AST: 23 U/L (ref 0–37)
Alkaline Phosphatase: 113 U/L (ref 39–117)
BUN: 16 mg/dL (ref 6–23)
CO2: 28 mEq/L (ref 19–32)
Calcium: 9.5 mg/dL (ref 8.4–10.5)
Chloride: 101 mEq/L (ref 96–112)
Creat: 1.02 mg/dL (ref 0.50–1.35)
GFR, EST NON AFRICAN AMERICAN: 84 mL/min
GFR, Est African American: >89 mL/min
GLUCOSE: 249 mg/dL — AB (ref 70–99)
POTASSIUM: 4.8 meq/L (ref 3.5–5.3)
Sodium: 137 mEq/L (ref 135–145)
Total Bilirubin: 0.3 mg/dL (ref 0.2–1.2)
Total Protein: 8.2 g/dL (ref 6.0–8.3)

## 2014-01-23 LAB — GLUCOSE, POCT (MANUAL RESULT ENTRY): POC Glucose: 262 mg/dl — AB (ref 70–99)

## 2014-01-23 LAB — POCT GLYCOSYLATED HEMOGLOBIN (HGB A1C): Hemoglobin A1C: 8.3

## 2014-01-23 MED ORDER — METFORMIN HCL 1000 MG PO TABS: 1000.0000 mg | ORAL_TABLET | Freq: Two times a day (BID) | ORAL | Status: DC

## 2014-01-23 MED ORDER — NAPROXEN 500 MG PO TABS: 500.0000 mg | ORAL_TABLET | Freq: Two times a day (BID) | ORAL | Status: DC

## 2014-01-23 MED ORDER — LISINOPRIL 20 MG PO TABS: 20.0000 mg | ORAL_TABLET | Freq: Every day | ORAL | Status: DC

## 2014-01-23 MED ORDER — INSULIN NPH ISOPHANE & REGULAR (70-30) 100 UNIT/ML ~~LOC~~ SUSP: 35.0000 [IU] | Freq: Two times a day (BID) | SUBCUTANEOUS | Status: DC

## 2014-01-23 MED ORDER — SIMVASTATIN 40 MG PO TABS: 40.0000 mg | ORAL_TABLET | Freq: Every day | ORAL | Status: DC

## 2014-01-23 MED ORDER — CLONIDINE HCL 0.1 MG PO TABS
0.1000 mg | ORAL_TABLET | Freq: Once | ORAL | Status: AC
  Administered 2014-01-23: 0.1 mg via ORAL

## 2014-01-23 NOTE — Progress Notes (Signed)
Patient here for follow up on his Diabetes Patient was recently incarcerated and was taking humulin R and humulin N Not sure of the instructions that go with the two medications

## 2014-01-23 NOTE — Progress Notes (Signed)
MRN: [id number] Name: Eliott Amparan  Sex: male Age: 53 y.o. DOB: 04-23-1960  Allergies: Review of patient's allergies indicates no known allergies.  Chief Complaint  Patient presents with  . Follow-up    HPI: Patient is 53 y.o. male who has history of diabetes hypertension hyperlipidemia as per patient he was incarcerated and his insulin was changed, in the past he was supposed to take insulin 70/30 34 units 2 times a day, today his hemoglobin A1c noticed to be slightly trended up at 0.3%, his blood pressure is elevated her as per patient he was prescribed lisinopril 10 mg, in the past he was taking lisinopril 20 mg daily, he was given clonidine and his repeat manual blood pressure is 160/90, patient denies any acute symptoms, would like to get a flu shot and Pneumovax today. Her  Past Medical History  Diagnosis Date  . Diabetes mellitus without complication   . Hypertension   . High cholesterol   . Migraine   . Depression   . Arthritis   . Alcohol abuse   . Polysubstance abuse     alcohol and cocaine  . Polyneuropathy in diabetes(357.2)   . Memory disorder 08/22/2013    Past Surgical History  Procedure Laterality Date  . Eye surgery  04/2012    detatched retna, bilateral  . Eye surgery  08/2012  . Cataract extraction      bilaterally      Medication List       This list is accurate as of: 01/23/14  1:11 PM.  Always use your most recent med list.               accu-chek soft touch lancets  Use as instructed     aspirin 81 MG EC tablet  Take 81 mg by mouth daily.     ferrous sulfate 325 (65 FE) MG tablet  Take 1 tablet (325 mg total) by mouth daily with breakfast.     glucose blood test strip  Use as instructed     glucose monitoring kit monitoring kit  1 each by Does not apply route as needed for other.     insulin NPH Human 100 UNIT/ML injection  Commonly known as:  HUMULIN N,NOVOLIN N  Inject into the skin.     insulin NPH-regular Human  (70-30) 100 UNIT/ML injection  Commonly known as:  NOVOLIN 70/30  Inject 35 Units into the skin 2 (two) times daily with a meal.     insulin regular 100 units/mL injection  Commonly known as:  NOVOLIN R,HUMULIN R  Inject into the skin 3 (three) times daily before meals.     Insulin Syringe-Needle U-100 31G X 5/16" 0.3 ML Misc  Commonly known as:  B-D INSULIN SYRINGE  As directed     Insulin Syringes (Disposable) U-100 0.5 ML Misc  Uses as directed     lisinopril 20 MG tablet  Commonly known as:  PRINIVIL,ZESTRIL  Take 1 tablet (20 mg total) by mouth daily. For kidney protection     metFORMIN 1000 MG tablet  Commonly known as:  GLUCOPHAGE  Take 1 tablet (1,000 mg total) by mouth 2 (two) times daily.     naproxen 500 MG tablet  Commonly known as:  NAPROSYN  Take 1 tablet (500 mg total) by mouth 2 (two) times daily with a meal.     simvastatin 40 MG tablet  Commonly known as:  ZOCOR  Take 1 tablet (40 mg total) by mouth daily.  Meds ordered this encounter  Medications  . cloNIDine (CATAPRES) tablet 0.1 mg    Sig:   . insulin regular (NOVOLIN R,HUMULIN R) 100 units/mL injection    Sig: Inject into the skin 3 (three) times daily before meals.  . insulin NPH Human (HUMULIN N,NOVOLIN N) 100 UNIT/ML injection    Sig: Inject into the skin.  Marland Kitchen insulin NPH-regular Human (NOVOLIN 70/30) (70-30) 100 UNIT/ML injection    Sig: Inject 35 Units into the skin 2 (two) times daily with a meal.    Dispense:  10 mL    Refill:  11  . simvastatin (ZOCOR) 40 MG tablet    Sig: Take 1 tablet (40 mg total) by mouth daily.    Dispense:  30 tablet    Refill:  3  . metFORMIN (GLUCOPHAGE) 1000 MG tablet    Sig: Take 1 tablet (1,000 mg total) by mouth 2 (two) times daily.    Dispense:  60 tablet    Refill:  12  . lisinopril (PRINIVIL,ZESTRIL) 20 MG tablet    Sig: Take 1 tablet (20 mg total) by mouth daily. For kidney protection    Dispense:  60 tablet    Refill:  2  . naproxen  (NAPROSYN) 500 MG tablet    Sig: Take 1 tablet (500 mg total) by mouth 2 (two) times daily with a meal.    Dispense:  60 tablet    Refill:  0    Immunization History  Administered Date(s) Administered  . Influenza,inj,Quad PF,36+ Mos 01/23/2014  . Pneumococcal Polysaccharide-23 01/23/2014  . Td 05/01/2008  . Tetanus 03/28/2013    Family History  Problem Relation Age of Onset  . Alzheimer's disease Brother   . Cancer Father   . Hypertension Father   . Heart attack Father   . Hypertension Mother   . Gout Mother   . Diabetes Sister   . Hypertension Sister   . Hypertension Sister   . Hypertension Sister     History  Substance Use Topics  . Smoking status: Former Smoker    Quit date: 04/16/2011  . Smokeless tobacco: Not on file  . Alcohol Use: Yes     Comment: states occassionally drinks beer    Review of Systems   As noted in HPI  Filed Vitals:   01/23/14 1302  BP: 160/90  Pulse:   Temp:   Resp:     Physical Exam  Physical Exam  Constitutional: No distress.  Eyes: EOM are normal. Pupils are equal, round, and reactive to light.  Neck: Neck supple.  Cardiovascular: Normal rate and regular rhythm.   Pulmonary/Chest: Breath sounds normal. No respiratory distress. He has no wheezes. He has no rales.  Musculoskeletal: He exhibits no edema.    CBC    Component Value Date/Time   WBC 5.3 03/28/2013 1717   RBC 5.78 03/28/2013 1717   HGB 12.5* 03/28/2013 1717   HCT 38.9* 03/28/2013 1717   PLT 285 03/28/2013 1717   MCV 67.3* 03/28/2013 1717   LYMPHSABS 1.8 03/28/2013 1717   MONOABS 0.5 03/28/2013 1717   EOSABS 0.1 03/28/2013 1717   BASOSABS 0.0 03/28/2013 1717    CMP     Component Value Date/Time   NA 141 03/28/2013 1717   K 4.6 03/28/2013 1717   CL 102 03/28/2013 1717   CO2 28 03/28/2013 1717   GLUCOSE 156* 03/28/2013 1717   BUN 15 03/28/2013 1717   CREATININE 0.97 03/28/2013 1717   CREATININE 1.1 05/18/2010 1711  CALCIUM 9.4 03/28/2013 1717    PROT 7.2 03/28/2013 1717   ALBUMIN 4.3 03/28/2013 1717   AST 15 03/28/2013 1717   ALT 26 03/28/2013 1717   ALKPHOS 94 03/28/2013 1717   BILITOT 0.3 03/28/2013 1717   GFRNONAA 89 03/28/2013 1717   GFRNONAA >60 12/08/2008 1137   GFRAA >89 03/28/2013 1717   GFRAA  12/08/2008 1137    >60        The eGFR has been calculated using the MDRD equation. This calculation has not been validated in all clinical situations. eGFR's persistently <60 mL/min signify possible Chronic Kidney Disease.    Lab Results  Component Value Date/Time   CHOL 112 03/28/2013 05:17 PM    No components found for: HGA1C  Lab Results  Component Value Date/Time   AST 15 03/28/2013 05:17 PM    Assessment and Plan  Other specified diabetes mellitus without complications - Plan:  Results for orders placed or performed in visit on 01/23/14  Glucose (CBG)  Result Value Ref Range   POC Glucose 262 (A) 70 - 99 mg/dl  HgB A1c  Result Value Ref Range   Hemoglobin A1C 8.3    Diabetes is still uncontrolled, I have increased the dose of insulin to 35 units twice a day, continue with metformin, advise for diabetes meal planning, continue with fingerstick glucose monitoring, will repeat A1c in 3 months. Glucose (CBG), HgB A1c, insulin NPH-regular Human (NOVOLIN 70/30) (70-30) 100 UNIT/ML injection, metFORMIN (GLUCOPHAGE) 1000 MG tablet  Elevated blood pressure - Plan: cloNIDine (CATAPRES) tablet 0.1 mg, Repeat manual blood pressure is 160/90.  Essential hypertension, benign - Plan:advised patient for DASH diet, resume back on lisinopril 20 mg daily, patient will come back in 2 weeks for nurse visit BP check if it's still elevated consider increasing the dose. lisinopril (PRINIVIL,ZESTRIL) 20 MG tablet, COMPLETE METABOLIC PANEL WITH GFR  Hyperlipidemia - Plan: resume back on simvastatin (ZOCOR) 40 MG tablet, will repeat fasting lipid panel on next visit.  Needs flu shot Flu shot given today.  Need for  prophylactic vaccination against Streptococcus pneumoniae (pneumococcus) - Plan: Pneumococcal polysaccharide vaccine 23-valent greater than or equal to 2yo subcutaneous/IM   Health Maintenance  -Vaccinations:  Flu shot and Pneumovax given today  Return in about 3 months (around 04/24/2014) for diabetes, hypertension, BP check in 2 weeks/Nurse Visit.  Lorayne Marek, MD

## 2014-01-23 NOTE — Patient Instructions (Signed)
Diabetes Mellitus and Food It is important for you to manage your blood sugar (glucose) level. Your blood glucose level can be greatly affected by what you eat. Eating healthier foods in the appropriate amounts throughout the day at about the same time each day will help you control your blood glucose level. It can also help slow or prevent worsening of your diabetes mellitus. Healthy eating may even help you improve the level of your blood pressure and reach or maintain a healthy weight.  HOW CAN FOOD AFFECT ME? Carbohydrates Carbohydrates affect your blood glucose level more than any other type of food. Your dietitian will help you determine how many carbohydrates to eat at each meal and teach you how to count carbohydrates. Counting carbohydrates is important to keep your blood glucose at a healthy level, especially if you are using insulin or taking certain medicines for diabetes mellitus. Alcohol Alcohol can cause sudden decreases in blood glucose (hypoglycemia), especially if you use insulin or take certain medicines for diabetes mellitus. Hypoglycemia can be a life-threatening condition. Symptoms of hypoglycemia (sleepiness, dizziness, and disorientation) are similar to symptoms of having too much alcohol.  If your health care provider has given you approval to drink alcohol, do so in moderation and use the following guidelines:  Women should not have more than one drink per day, and men should not have more than two drinks per day. One drink is equal to:  12 oz of beer.  5 oz of wine.  1 oz of hard liquor.  Do not drink on an empty stomach.  Keep yourself hydrated. Have water, diet soda, or unsweetened iced tea.  Regular soda, juice, and other mixers might contain a lot of carbohydrates and should be counted. WHAT FOODS ARE NOT RECOMMENDED? As you make food choices, it is important to remember that all foods are not the same. Some foods have fewer nutrients per serving than other  foods, even though they might have the same number of calories or carbohydrates. It is difficult to get your body what it needs when you eat foods with fewer nutrients. Examples of foods that you should avoid that are high in calories and carbohydrates but low in nutrients include:  Trans fats (most processed foods list trans fats on the Nutrition Facts label).  Regular soda.  Juice.  Candy.  Sweets, such as cake, pie, doughnuts, and cookies.  Fried foods. WHAT FOODS CAN I EAT? Have nutrient-rich foods, which will nourish your body and keep you healthy. The food you should eat also will depend on several factors, including:  The calories you need.  The medicines you take.  Your weight.  Your blood glucose level.  Your blood pressure level.  Your cholesterol level. You also should eat a variety of foods, including:  Protein, such as meat, poultry, fish, tofu, nuts, and seeds (lean animal proteins are best).  Fruits.  Vegetables.  Dairy products, such as milk, cheese, and yogurt (low fat is best).  Breads, grains, pasta, cereal, rice, and beans.  Fats such as olive oil, trans fat-free margarine, canola oil, avocado, and olives. DOES EVERYONE WITH DIABETES MELLITUS HAVE THE SAME MEAL PLAN? Because every person with diabetes mellitus is different, there is not one meal plan that works for everyone. It is very important that you meet with a dietitian who will help you create a meal plan that is just right for you. Document Released: 10/29/2004 Document Revised: 02/06/2013 Document Reviewed: 12/29/2012 Exit[employer] Patient Information 2015 [employer]. This   information is not intended to replace advice given to you by your health care provider. Make sure you discuss any questions you have with your health care provider. DASH Eating Plan DASH stands for "Dietary Approaches to Stop Hypertension." The DASH eating plan is a healthy eating plan that has been shown to reduce high  blood pressure (hypertension). Additional health benefits may include reducing the risk of type 2 diabetes mellitus, heart disease, and stroke. The DASH eating plan may also help with weight loss. WHAT DO I NEED TO KNOW ABOUT THE DASH EATING PLAN? For the DASH eating plan, you will follow these general guidelines:  Choose foods with a percent daily value for sodium of less than 5% (as listed on the food label).  Use salt-free seasonings or herbs instead of table salt or sea salt.  Check with your health care provider or pharmacist before using salt substitutes.  Eat lower-sodium products, often labeled as "lower sodium" or "no salt added."  Eat fresh foods.  Eat more vegetables, fruits, and low-fat dairy products.  Choose whole grains. Look for the word "whole" as the first word in the ingredient list.  Choose fish and skinless chicken or turkey more often than red meat. Limit fish, poultry, and meat to 6 oz (170 g) each day.  Limit sweets, desserts, sugars, and sugary drinks.  Choose heart-healthy fats.  Limit cheese to 1 oz (28 g) per day.  Eat more home-cooked food and less restaurant, buffet, and fast food.  Limit fried foods.  Cook foods using methods other than frying.  Limit canned vegetables. If you do use them, rinse them well to decrease the sodium.  When eating at a restaurant, ask that your food be prepared with less salt, or no salt if possible. WHAT FOODS CAN I EAT? Seek help from a dietitian for individual calorie needs. Grains Whole grain or whole wheat bread. Brown rice. Whole grain or whole wheat pasta. Quinoa, bulgur, and whole grain cereals. Low-sodium cereals. Corn or whole wheat flour tortillas. Whole grain cornbread. Whole grain crackers. Low-sodium crackers. Vegetables Fresh or frozen vegetables (raw, steamed, roasted, or grilled). Low-sodium or reduced-sodium tomato and vegetable juices. Low-sodium or reduced-sodium tomato sauce and paste. Low-sodium  or reduced-sodium canned vegetables.  Fruits All fresh, canned (in natural juice), or frozen fruits. Meat and Other Protein Products Ground beef (85% or leaner), grass-fed beef, or beef trimmed of fat. Skinless chicken or turkey. Ground chicken or turkey. Pork trimmed of fat. All fish and seafood. Eggs. Dried beans, peas, or lentils. Unsalted nuts and seeds. Unsalted canned beans. Dairy Low-fat dairy products, such as skim or 1% milk, 2% or reduced-fat cheeses, low-fat ricotta or cottage cheese, or plain low-fat yogurt. Low-sodium or reduced-sodium cheeses. Fats and Oils Tub margarines without trans fats. Light or reduced-fat mayonnaise and salad dressings (reduced sodium). Avocado. Safflower, olive, or canola oils. Natural peanut or almond butter. Other Unsalted popcorn and pretzels. The items listed above may not be a complete list of recommended foods or beverages. Contact your dietitian for more options. WHAT FOODS ARE NOT RECOMMENDED? Grains White bread. White pasta. White rice. Refined cornbread. Bagels and croissants. Crackers that contain trans fat. Vegetables Creamed or fried vegetables. Vegetables in a cheese sauce. Regular canned vegetables. Regular canned tomato sauce and paste. Regular tomato and vegetable juices. Fruits Dried fruits. Canned fruit in light or heavy syrup. Fruit juice. Meat and Other Protein Products Fatty cuts of meat. Ribs, chicken wings, bacon, sausage, bologna, salami, chitterlings, fatback, hot   dogs, bratwurst, and packaged luncheon meats. Salted nuts and seeds. Canned beans with salt. Dairy Whole or 2% milk, cream, half-and-half, and cream cheese. Whole-fat or sweetened yogurt. Full-fat cheeses or blue cheese. Nondairy creamers and whipped toppings. Processed cheese, cheese spreads, or cheese curds. Condiments Onion and garlic salt, seasoned salt, table salt, and sea salt. Canned and packaged gravies. Worcestershire sauce. Tartar sauce. Barbecue sauce.  Teriyaki sauce. Soy sauce, including reduced sodium. Steak sauce. Fish sauce. Oyster sauce. Cocktail sauce. Horseradish. Ketchup and mustard. Meat flavorings and tenderizers. Bouillon cubes. Hot sauce. Tabasco sauce. Marinades. Taco seasonings. Relishes. Fats and Oils Butter, stick margarine, lard, shortening, ghee, and bacon fat. Coconut, palm kernel, or palm oils. Regular salad dressings. Other Pickles and olives. Salted popcorn and pretzels. The items listed above may not be a complete list of foods and beverages to avoid. Contact your dietitian for more information. WHERE CAN I FIND MORE INFORMATION? National Heart, [employer], and [employer] Institute: www.nhlbi.nih.gov/health/health-topics/topics/dash/ Document Released: 01/21/2011 Document Revised: 06/18/2013 Document Reviewed: 12/06/2012 Exit[employer] Patient Information 2015 [employer]. This information is not intended to replace advice given to you by your health care provider. Make sure you discuss any questions you have with your health care provider.  

## 2014-02-17 ENCOUNTER — Ambulatory Visit
Admission: RE | Admit: 2014-02-17 | Discharge: 2014-02-17 | Disposition: A | Discharge status: Home / Self Care | Payer: Medicaid Other | Source: Ambulatory Visit | Attending: Neurology | Admitting: Neurology

## 2014-02-17 DIAGNOSIS — R413 Other amnesia: Secondary | ICD-10-CM

## 2014-02-18 ENCOUNTER — Telehealth: Payer: Self-pay | Admitting: Neurology

## 2014-02-18 NOTE — Telephone Encounter (Signed)
  I called the patient, talk with the wife, MRI the brain was normal.  MRI brain 02/17/2013:  IMPRESSION: Within normal limits for age. No imaging evidence of previous head trauma. No finding to correlate with the presenting symptoms.

## 2014-03-04 ENCOUNTER — Encounter (INDEPENDENT_AMBULATORY_CARE_PROVIDER_SITE_OTHER): Payer: Medicaid Other | Admitting: Ophthalmology

## 2014-03-04 ENCOUNTER — Other Ambulatory Visit: Payer: Self-pay | Admitting: Internal Medicine

## 2014-03-04 DIAGNOSIS — E11311 Type 2 diabetes mellitus with unspecified diabetic retinopathy with macular edema: Secondary | ICD-10-CM

## 2014-03-04 DIAGNOSIS — E11351 Type 2 diabetes mellitus with proliferative diabetic retinopathy with macular edema: Secondary | ICD-10-CM

## 2014-03-21 ENCOUNTER — Other Ambulatory Visit: Payer: Self-pay | Admitting: Internal Medicine

## 2014-04-12 ENCOUNTER — Telehealth: Payer: Self-pay | Admitting: Internal Medicine

## 2014-04-12 NOTE — Telephone Encounter (Signed)
Patient called stating that he has had a cold for about a month, patient states that his left arm has also been hurting. Please f/u with pt.

## 2014-04-15 NOTE — Telephone Encounter (Signed)
Left voice message to return call, need to schedule OV with PCP

## 2014-05-05 ENCOUNTER — Emergency Department (HOSPITAL_COMMUNITY): Payer: Medicaid Other

## 2014-05-05 ENCOUNTER — Encounter (HOSPITAL_COMMUNITY): Payer: Self-pay | Admitting: *Deleted

## 2014-05-05 ENCOUNTER — Emergency Department (HOSPITAL_COMMUNITY)
Admission: EM | Admit: 2014-05-05 | Discharge: 2014-05-05 | Disposition: A | Discharge status: Home / Self Care | Payer: Medicaid Other | Attending: Emergency Medicine | Admitting: Emergency Medicine

## 2014-05-05 DIAGNOSIS — I509 Heart failure, unspecified: Secondary | ICD-10-CM | POA: Diagnosis not present

## 2014-05-05 DIAGNOSIS — Z8659 Personal history of other mental and behavioral disorders: Secondary | ICD-10-CM | POA: Insufficient documentation

## 2014-05-05 DIAGNOSIS — C3432 Malignant neoplasm of lower lobe, left bronchus or lung: Secondary | ICD-10-CM

## 2014-05-05 DIAGNOSIS — Z79899 Other long term (current) drug therapy: Secondary | ICD-10-CM | POA: Insufficient documentation

## 2014-05-05 DIAGNOSIS — R109 Unspecified abdominal pain: Secondary | ICD-10-CM

## 2014-05-05 DIAGNOSIS — E119 Type 2 diabetes mellitus without complications: Secondary | ICD-10-CM | POA: Diagnosis not present

## 2014-05-05 DIAGNOSIS — Z794 Long term (current) use of insulin: Secondary | ICD-10-CM | POA: Insufficient documentation

## 2014-05-05 DIAGNOSIS — Z720 Tobacco use: Secondary | ICD-10-CM | POA: Diagnosis not present

## 2014-05-05 DIAGNOSIS — M1990 Unspecified osteoarthritis, unspecified site: Secondary | ICD-10-CM | POA: Insufficient documentation

## 2014-05-05 DIAGNOSIS — R1013 Epigastric pain: Secondary | ICD-10-CM | POA: Insufficient documentation

## 2014-05-05 DIAGNOSIS — G43909 Migraine, unspecified, not intractable, without status migrainosus: Secondary | ICD-10-CM | POA: Diagnosis not present

## 2014-05-05 DIAGNOSIS — R1011 Right upper quadrant pain: Secondary | ICD-10-CM | POA: Diagnosis not present

## 2014-05-05 DIAGNOSIS — I10 Essential (primary) hypertension: Secondary | ICD-10-CM | POA: Insufficient documentation

## 2014-05-05 DIAGNOSIS — E780 Pure hypercholesterolemia: Secondary | ICD-10-CM | POA: Diagnosis not present

## 2014-05-05 DIAGNOSIS — I3139 Other pericardial effusion (noninflammatory): Secondary | ICD-10-CM

## 2014-05-05 DIAGNOSIS — I313 Pericardial effusion (noninflammatory): Secondary | ICD-10-CM | POA: Insufficient documentation

## 2014-05-05 DIAGNOSIS — Z791 Long term (current) use of non-steroidal anti-inflammatories (NSAID): Secondary | ICD-10-CM | POA: Diagnosis not present

## 2014-05-05 DIAGNOSIS — R0602 Shortness of breath: Secondary | ICD-10-CM | POA: Insufficient documentation

## 2014-05-05 DIAGNOSIS — Z7982 Long term (current) use of aspirin: Secondary | ICD-10-CM | POA: Insufficient documentation

## 2014-05-05 LAB — I-STAT TROPONIN, ED: TROPONIN I, POC: 0 ng/mL (ref 0.00–0.08)

## 2014-05-05 LAB — HEPATIC FUNCTION PANEL
ALK PHOS: 108 U/L (ref 39–117)
ALT: 24 U/L (ref 0–53)
AST: 20 U/L (ref 0–37)
Albumin: 3.7 g/dL (ref 3.5–5.2)
Bilirubin, Direct: <0.1 mg/dL (ref 0.0–0.5)
TOTAL PROTEIN: 8 g/dL (ref 6.0–8.3)
Total Bilirubin: 0.5 mg/dL (ref 0.3–1.2)

## 2014-05-05 LAB — CBC
HEMATOCRIT: 35.9 % — AB (ref 39.0–52.0)
HEMOGLOBIN: 12 g/dL — AB (ref 13.0–17.0)
MCH: 22.1 pg — AB (ref 26.0–34.0)
MCHC: 33.4 g/dL (ref 30.0–36.0)
MCV: 66.2 fL — AB (ref 78.0–100.0)
PLATELETS: 224 10*3/uL (ref 150–400)
RBC: 5.42 MIL/uL (ref 4.22–5.81)
RDW: 15.1 % (ref 11.5–15.5)
WBC: 12.4 10*3/uL — ABNORMAL HIGH (ref 4.0–10.5)

## 2014-05-05 LAB — BASIC METABOLIC PANEL
Anion gap: 6 (ref 5–15)
BUN: 14 mg/dL (ref 6–23)
CHLORIDE: 102 mmol/L (ref 96–112)
CO2: 28 mmol/L (ref 19–32)
CREATININE: 1.26 mg/dL (ref 0.50–1.35)
Calcium: 9.4 mg/dL (ref 8.4–10.5)
GFR calc Af Amer: 73 mL/min — ABNORMAL LOW (ref >90)
GFR calc non Af Amer: 63 mL/min — ABNORMAL LOW (ref >90)
Glucose, Bld: 184 mg/dL — ABNORMAL HIGH (ref 70–99)
POTASSIUM: 4 mmol/L (ref 3.5–5.1)
Sodium: 136 mmol/L (ref 135–145)

## 2014-05-05 LAB — LIPASE, BLOOD: Lipase: 29 U/L (ref 11–59)

## 2014-05-05 MED ORDER — BENZONATATE 100 MG PO CAPS: 100.0000 mg | ORAL_CAPSULE | Freq: Three times a day (TID) | ORAL | Status: DC

## 2014-05-05 MED ORDER — HYDROCODONE-ACETAMINOPHEN 5-325 MG PO TABS: 2.0000 | ORAL_TABLET | ORAL | Status: DC | PRN

## 2014-05-05 MED ORDER — NAPROXEN 500 MG PO TABS: 500.0000 mg | ORAL_TABLET | Freq: Two times a day (BID) | ORAL | Status: DC

## 2014-05-05 MED ORDER — IOHEXOL 300 MG/ML  SOLN
100.0000 mL | Freq: Once | INTRAMUSCULAR | Status: AC | PRN
  Administered 2014-05-05: 100 mL via INTRAVENOUS

## 2014-05-05 MED ORDER — HYDROMORPHONE HCL 1 MG/ML IJ SOLN
1.0000 mg | Freq: Once | INTRAMUSCULAR | Status: AC
  Administered 2014-05-05: 1 mg via INTRAVENOUS
  Filled 2014-05-05: qty 1

## 2014-05-05 MED ORDER — IOHEXOL 300 MG/ML  SOLN
25.0000 mL | INTRAMUSCULAR | Status: DC
  Administered 2014-05-05: 25 mL via ORAL

## 2014-05-05 NOTE — ED Notes (Addendum)
Pt finished with oral CT contrast. CT informed.

## 2014-05-05 NOTE — ED Notes (Signed)
Pt in c/o right rib pain and shortness of breath, denies injury, pain is worse with movement or taking a deep breath

## 2014-05-05 NOTE — ED Provider Notes (Signed)
CSN: [id number]     Arrival date & time 05/05/14  2004 History   First MD Initiated Contact with Patient 05/05/14 2050     Chief Complaint  Patient presents with  . Chest Pain  . Shortness of Breath     (Consider location/radiation/quality/duration/timing/severity/associated sxs/prior Treatment) HPI Comments: The patient is a 54 year old male, he has a history of diabetes, hypertension and hypercholesterolemia. He complains of right-sided upper abdominal pain and shortness of breath that started yesterday when he returned home from a trip to a funeral. This was a several hour trip. He has been coughing for several months, was told that he possibly had a pneumonia or fluid building up on his lungs.  But has been unable to be definitively diagnosed with either pneumonia, congestive heart failure or any other specific etiology. He states that yesterday he had rather acute onset of upper abdominal pain on the right, this seems to get worse with eating, it is not associated with diarrhea constipation or blood in the stools or vomiting. He does feel like his abdomen is bloated and distended, he does report that he has been passing gas today. He denies any prior abdominal surgical history, has no history of cancer, his symptoms are severe.  Patient is a 54 y.o. male presenting with chest pain and shortness of breath. The history is provided by the patient.  Chest Pain Associated symptoms: shortness of breath   Shortness of Breath Associated symptoms: chest pain     Past Medical History  Diagnosis Date  . Diabetes mellitus without complication   . Hypertension   . High cholesterol   . Migraine   . Depression   . Arthritis   . Alcohol abuse   . Polysubstance abuse     alcohol and cocaine  . Polyneuropathy in diabetes(357.2)   . Memory disorder 08/22/2013  . CHF (congestive heart failure)    Past Surgical History  Procedure Laterality Date  . Eye surgery  04/2012    detatched retna,  bilateral  . Eye surgery  08/2012  . Cataract extraction      bilaterally   Family History  Problem Relation Age of Onset  . Alzheimer's disease Brother   . Cancer Father   . Hypertension Father   . Heart attack Father   . Hypertension Mother   . Gout Mother   . Diabetes Sister   . Hypertension Sister   . Hypertension Sister   . Hypertension Sister    History  Substance Use Topics  . Smoking status: Current Some Day Smoker -- 0.50 packs/day    Types: Cigarettes    Last Attempt to Quit: 04/16/2011  . Smokeless tobacco: Not on file     Comment: just recently began to smoke again after quitting in 2013, 3 cigarettes a week  . Alcohol Use: Yes     Comment: states occassionally drinks beer    Review of Systems  Respiratory: Positive for shortness of breath.   Cardiovascular: Positive for chest pain.  All other systems reviewed and are negative.     Allergies  Review of patient's allergies indicates no known allergies.  Home Medications   Prior to Admission medications   Medication Sig Start Date End Date Taking? Authorizing Provider  aspirin 81 MG EC tablet Take 81 mg by mouth daily.     Yes Historical Provider, MD  ibuprofen (ADVIL,MOTRIN) 200 MG tablet Take 200 mg by mouth every 6 (six) hours as needed for mild pain.   Yes  Historical Provider, MD  insulin NPH-regular Human (NOVOLIN 70/30) (70-30) 100 UNIT/ML injection Inject 35 Units into the skin 2 (two) times daily with a meal. 01/23/14  Yes Deepak Advani, MD  lisinopril (PRINIVIL,ZESTRIL) 20 MG tablet Take 1 tablet (20 mg total) by mouth daily. For kidney protection 01/23/14  Yes Lorayne Marek, MD  metFORMIN (GLUCOPHAGE) 1000 MG tablet Take 1 tablet (1,000 mg total) by mouth 2 (two) times daily. 01/23/14  Yes Lorayne Marek, MD  naproxen sodium (ANAPROX) 220 MG tablet Take 220 mg by mouth 2 (two) times daily with a meal.   Yes Historical Provider, MD  simvastatin (ZOCOR) 40 MG tablet Take 1 tablet (40 mg total) by mouth  daily. 01/23/14  Yes Lorayne Marek, MD  benzonatate (TESSALON) 100 MG capsule Take 1 capsule (100 mg total) by mouth every 8 (eight) hours. 05/05/14   Noemi Chapel, MD  ferrous sulfate 325 (65 FE) MG tablet Take 1 tablet (325 mg total) by mouth daily with breakfast. Patient not taking: Reported on 01/21/2014 03/30/13   Reyne Dumas, MD  HYDROcodone-acetaminophen (NORCO/VICODIN) 5-325 MG per tablet Take 2 tablets by mouth every 4 (four) hours as needed. 05/05/14   Noemi Chapel, MD  naproxen (NAPROSYN) 500 MG tablet Take 1 tablet (500 mg total) by mouth 2 (two) times daily with a meal. 05/05/14   Noemi Chapel, MD   BP 139/89 mmHg  Pulse 76  Temp(Src) 98 F (36.7 C) (Oral)  Resp 23  Wt 206 lb (93.441 kg)  SpO2 95% Physical Exam  Constitutional: He appears well-developed and well-nourished. No distress.  HENT:  Head: Normocephalic and atraumatic.  Mouth/Throat: Oropharynx is clear and moist. No oropharyngeal exudate.  Eyes: Conjunctivae and EOM are normal. Pupils are equal, round, and reactive to light. Right eye exhibits no discharge. Left eye exhibits no discharge. No scleral icterus.  Neck: Normal range of motion. Neck supple. No JVD present. No thyromegaly present.  Cardiovascular: Normal rate, regular rhythm, normal heart sounds and intact distal pulses.  Exam reveals no gallop and no friction rub.   No murmur heard. Pulmonary/Chest: Effort normal. No respiratory distress. He has no wheezes. He has rales (rales at the bilateral bases).  Patient takes shallow breaths secondary to pain  Abdominal: Soft. Bowel sounds are normal. He exhibits no distension and no mass. There is tenderness ( Focal tenderness to the right upper quadrant, right mid abdomen and epigastrium. The patient has difficulty with deep breath secondary to pain).  Musculoskeletal: Normal range of motion. He exhibits no edema or tenderness.  Lymphadenopathy:    He has no cervical adenopathy.  Neurological: He is alert.  Coordination normal.  Skin: Skin is warm and dry. No rash noted. No erythema.  Psychiatric: He has a normal mood and affect. His behavior is normal.  Nursing note and vitals reviewed.   ED Course  Procedures (including critical care time) Labs Review Labs Reviewed  CBC - Abnormal; Notable for the following:    WBC 12.4 (*)    Hemoglobin 12.0 (*)    HCT 35.9 (*)    MCV 66.2 (*)    MCH 22.1 (*)    All other components within normal limits  BASIC METABOLIC PANEL - Abnormal; Notable for the following:    Glucose, Bld 184 (*)    GFR calc non Af Amer 63 (*)    GFR calc Af Amer 73 (*)    All other components within normal limits  HEPATIC FUNCTION PANEL  LIPASE, BLOOD  I-STAT TROPOININ,  ED    Imaging Review Dg Chest 2 View  05/05/2014   CLINICAL DATA:  Acute onset of cough and shortness of breath. Right-sided flank pain for 3 days. Initial encounter.  EXAM: CHEST  2 VIEW  COMPARISON:  Chest radiograph performed 05/16/2013  FINDINGS: The lungs are well-aerated. Left basilar airspace opacity raises concern for pneumonia. There is no evidence of pleural effusion or pneumothorax.  The heart is borderline normal in size. No acute osseous abnormalities are seen. Nodular densities overlying the mid lung zones are thought to reflect leads outside of the patient.  IMPRESSION: Left basilar airspace opacity raises concern for pneumonia. Would perform follow-up chest radiograph after completion of treatment for pneumonia, to ensure resolution of nodular densities overlying the midlung zones, thought to reflect leads outside the patient.   Electronically Signed   By: Garald Balding M.D.   On: 05/05/2014 21:01   Ct Chest W Contrast  05/05/2014   CLINICAL DATA:  Pleuritic right-sided chest pain  EXAM: CT CHEST, ABDOMEN, AND PELVIS WITH CONTRAST  TECHNIQUE: Multidetector CT imaging of the chest, abdomen and pelvis was performed following the standard protocol during bolus administration of intravenous  contrast.  CONTRAST:  153mL OMNIPAQUE IOHEXOL 300 MG/ML  SOLN  COMPARISON:  12/04/2003  FINDINGS: CT CHEST FINDINGS  There is extensive bulky malignant appearing adenopathy in the mediastinum. This includes a 6.0 x 8.5 x 6.5 cm anterior mediastinal mass directly above the pulmonary root, filling the retrosternal space. There is contiguous extension of soft tissue out into the left hilum. There is a 4.5 cm mass just inferior to the left hilum which could represent a primary neoplasm. Multiple pulmonary nodules are present bilaterally, likely hematogenous metastases. These measure up to 1 point 8 cm in size. There are small pleural effusions bilaterally. There is a small pericardial effusion. No skeletal lesions are evident.  CT ABDOMEN AND PELVIS FINDINGS  There are normal appearances of the liver and adrenals. There is no evidence of hematogenous hepatic or adrenal metastases.  Gallbladder and bile ducts appear unremarkable. The pancreas is normal. The spleen is normal.  Adrenals, kidneys, ureters and bladder appear unremarkable.  Bowel and mesentery appear unremarkable.  The abdominal aorta is normal in caliber with minimal atherosclerotic calcification. No adenopathy is evident in the abdomen or pelvis. There is no ascites.  IMPRESSION: 1. Malignant-appearing mediastinal and left hilar adenopathy. 4.5 cm mass inferior to left hilum may represent a primary left lower lobe malignancy. 2. Small pleural and pericardial effusions 3. Multiple pulmonary nodules, likely hematogenous metastases 4. No significant abnormality in the abdomen or pelvis.   Electronically Signed   By: Andreas Newport M.D.   On: 05/05/2014 23:14   Ct Abdomen Pelvis W Contrast  05/05/2014   CLINICAL DATA:  Pleuritic right-sided chest pain  EXAM: CT CHEST, ABDOMEN, AND PELVIS WITH CONTRAST  TECHNIQUE: Multidetector CT imaging of the chest, abdomen and pelvis was performed following the standard protocol during bolus administration of  intravenous contrast.  CONTRAST:  141mL OMNIPAQUE IOHEXOL 300 MG/ML  SOLN  COMPARISON:  12/04/2003  FINDINGS: CT CHEST FINDINGS  There is extensive bulky malignant appearing adenopathy in the mediastinum. This includes a 6.0 x 8.5 x 6.5 cm anterior mediastinal mass directly above the pulmonary root, filling the retrosternal space. There is contiguous extension of soft tissue out into the left hilum. There is a 4.5 cm mass just inferior to the left hilum which could represent a primary neoplasm. Multiple pulmonary nodules are present bilaterally,  likely hematogenous metastases. These measure up to 1 point 8 cm in size. There are small pleural effusions bilaterally. There is a small pericardial effusion. No skeletal lesions are evident.  CT ABDOMEN AND PELVIS FINDINGS  There are normal appearances of the liver and adrenals. There is no evidence of hematogenous hepatic or adrenal metastases.  Gallbladder and bile ducts appear unremarkable. The pancreas is normal. The spleen is normal.  Adrenals, kidneys, ureters and bladder appear unremarkable.  Bowel and mesentery appear unremarkable.  The abdominal aorta is normal in caliber with minimal atherosclerotic calcification. No adenopathy is evident in the abdomen or pelvis. There is no ascites.  IMPRESSION: 1. Malignant-appearing mediastinal and left hilar adenopathy. 4.5 cm mass inferior to left hilum may represent a primary left lower lobe malignancy. 2. Small pleural and pericardial effusions 3. Multiple pulmonary nodules, likely hematogenous metastases 4. No significant abnormality in the abdomen or pelvis.   Electronically Signed   By: Andreas Newport M.D.   On: 05/05/2014 23:14     EKG Interpretation   Date/Time:  Sunday May 05 2014 20:24:06 EDT Ventricular Rate:  98 PR Interval:  146 QRS Duration: 78 QT Interval:  322 QTC Calculation: 411 R Axis:   -19 Text Interpretation:  Normal sinus rhythm Left ventricular hypertrophy  Abnormal ECG Since last  tracing arm lead reversal fixed.  LVH - no other  old tracing Abnormal ekg Confirmed by Sabra Heck  MD, Henretta Quist ([id number]) on  05/05/2014 8:27:16 PM      MDM   Final diagnoses:  Abdominal pain, acute  Malignant neoplasm of lower lobe of left lung  Pericardial effusion    No signs of peripheral edema or pulmonary embolism, he has significant tenderness in his abdomen suggesting an acute abdominal source of symptoms. His x-ray does show left basilar airspace opacities that would be consistent with a pneumonia. With his abdominal symptoms and his chest symptoms it would behoove [place] to order CT scans of his chest abdomen and pelvis to further evaluate the source of these symptoms in case this is a infiltrative or metastatic illness.  The patient has a CT scan that is very concerning for metastatic tumor in the lower lobe of the left lung, this is near the mediastinum, there is hilar adenopathy associated with this, his lab work shows that he has normal liver function, slightly elevation in his white blood cell count and a normal kidney function and electrolytes. His pain in his abdomen is likely more coming from the irritation of the pleura on the right, he does have slight pleural effusions right and left as well as a small pericardial effusion, vital signs do not reflect any signs of hypotension or tach but not physiology. The bedside ultrasound also reveals that he has no signs of A knot on my exam.  The patient was informed of all of his results including the lung mass which is likely cancerous, he states that they were told several years ago that he had an abnormal nodule in his lungs, he states that he has not had a repeat CAT scan to follow-up for this until today. He will take his results to his family doctor this week, he will also have the phone number for the oncologist at [facility] for follow-up. They have expressed her understanding to the indications for follow-up.  Meds given in  ED:  Medications  iohexol (OMNIPAQUE) 300 MG/ML solution 25 mL (not administered)  HYDROmorphone (DILAUDID) injection 1 mg (1 mg Intravenous Given 05/05/14  2212)  iohexol (OMNIPAQUE) 300 MG/ML solution 100 mL (100 mLs Intravenous Contrast Given 05/05/14 2223)    New Prescriptions   BENZONATATE (TESSALON) 100 MG CAPSULE    Take 1 capsule (100 mg total) by mouth every 8 (eight) hours.   HYDROCODONE-ACETAMINOPHEN (NORCO/VICODIN) 5-325 MG PER TABLET    Take 2 tablets by mouth every 4 (four) hours as needed.   NAPROXEN (NAPROSYN) 500 MG TABLET    Take 1 tablet (500 mg total) by mouth 2 (two) times daily with a meal.        Noemi Chapel, MD 05/05/14 2329

## 2014-05-05 NOTE — ED Notes (Signed)
Pt educated on follow up care and when to return to ED. Given copy of today's test results and instructed to take to oncology appointment.

## 2014-05-05 NOTE — ED Notes (Signed)
PA student at bedside.

## 2014-05-05 NOTE — Discharge Instructions (Signed)
You MUST see your doctor this week to share your results with them, I have given you a copy of all your results - take this to your appointment.  They need to refer you to an Oncologist - this is a cancer doctor.  They work at [employer].  Please call your doctor for a followup appointment within 24-48 hours. When you talk to your doctor please let them know that you were seen in the emergency department and have them acquire all of your records so that they can discuss the findings with you and formulate a treatment plan to fully care for your new and ongoing problems.  Tessalon for cough Naprosyn twice daily Hydrocodone for severe pain  Return to the ER if you develop severe pain / worsening cough or difficulty breathing or feverse.

## 2014-05-17 ENCOUNTER — Other Ambulatory Visit (HOSPITAL_COMMUNITY): Payer: Self-pay | Admitting: Pulmonary Disease

## 2014-05-17 DIAGNOSIS — C3432 Malignant neoplasm of lower lobe, left bronchus or lung: Secondary | ICD-10-CM

## 2014-05-23 ENCOUNTER — Encounter: Payer: Self-pay | Admitting: Adult Health

## 2014-05-23 ENCOUNTER — Ambulatory Visit (INDEPENDENT_AMBULATORY_CARE_PROVIDER_SITE_OTHER): Payer: Medicare Other | Admitting: Adult Health

## 2014-05-23 VITALS — BP 130/78 | HR 107 | Ht 71.0 in | Wt 197.0 lb

## 2014-05-23 DIAGNOSIS — F1010 Alcohol abuse, uncomplicated: Secondary | ICD-10-CM

## 2014-05-23 DIAGNOSIS — R413 Other amnesia: Secondary | ICD-10-CM

## 2014-05-23 NOTE — Patient Instructions (Signed)
Memory has remained stable.  In the future we can consider Aricept. Let us know if you would like to start this for memory.

## 2014-05-23 NOTE — Progress Notes (Signed)
PATIENT: Keith Holloway DOB: 06-Jan-1961  REASON FOR VISIT: follow up- memory HISTORY FROM: patient  HISTORY OF PRESENT ILLNESS: Keith Holloway is a 54 year old male with a history of memory loss and alcohol abuse. He returns today for follow-up. He states that his memory has remained the same. He is able to complete all ADLs independently. He does not operate a motor vehicle due to his visual loss. His wife gives him his medications. The patient was recently diagnosed with stage IV lung cancer. They have currently not decided on treatment. He has several scans scheduled to insure the cancer has not spread to other organs. States that he did start back drinking alcohol. His wife states that he started this after his diagnosis. Patient states that he knows he needs to quit but it helps him to calm down. Denies any new neurological symptoms. He returns today for evaluation.  HISTORY 01/21/14: Keith Holloway is a 54 year old male with a history of memory loss, visual and auditory hallucinations and ETOH abuse. He returns today for follow-up. An MRI of the brain was ordered. Patient never had this done stating that he missed it and needs to reschedule. The patient feels that his memory is getting worse since the last visit. The patient states that he doesn't remember his doctors visits and appointments. His wife has to keep up with all his appointments. He states that if he sits something down he will forget where it put it. He will forget to take his medicine if his wife does not remind him. He is able to complete ADLs independently. He is seeing a Teacher, music at [employer]. He has stopped drinking ETOH for four months but this past Saturday he drank one beer while watching a football game. He states that since he stopped drinking he has not had any additional visual or auditory hallucinations. the wife reports that his psychiatrist started him on an antidepressant medication as well as a medication for  schizophrenia. However he did not start the medications because he was still drinking at that time.  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 The patient reports a two-year history of some issues with memory. The patient has a history of alcohol abuse, drinking up to 15 beers daily, and he has had associated alcoholic blackouts in the past. The patient has had a short-term memory issue, having difficulty remembering recent events. The patient has restrictions of activity of daily living mainly because of his blindness. He does not operate a motor vehicle, and he is unable to read. He has difficulty keeping up with appointments and medications. He has history of some marijuana use and cocaine abuse in the past as well. The patient was incarcerated until January of 2015. The patient is on probation currently, wearing ankle braces. The patient still tries to obtain beer to drink. The patient indicates for the last several years, dating back to 2012, he has had problems with auditory hallucinations. He will hear voices that tell him to do things, and he believes that he is invisible, and the voices will "watch out for him". The patient has been seen through mental health previously, and he was evaluated at [facility] in the past, but never hospitalized for extended periods of time. He has had no psychiatric followup recently. He has never been on medications for the auditory hallucinations. He has in the past had visual hallucinations, but not recently. He denies any focal  numbness or weakness of the face, arms, or legs with the exception of numbness of the feet associated with his diabetic peripheral neuropathy. He believes that his legs are weak at times when he is going upstairs. He denies problems controlling the bowels or the bladder, and he does report occasional headache. He has a history of a closed head injury when  he was in his 22s associated with loss of consciousness for 4 days. He has a brother around age 35 with Alzheimer's disease. He is sent to this office for an evaluation. Recent blood work that includes a vitamin B12 level and thyroid profile were unremarkable.  REVIEW OF SYSTEMS: Out of a complete 14 system review of symptoms, the patient complains only of the following symptoms, and all other reviewed systems are negative.  Activity change, appetite change, fatigue, runny nose, trouble swallowing, loss of vision, cough, shortness of breath, chest tightness, chest pain, joint pain, back pain, aching muscles, muscle cramps, weakness, memory loss  ALLERGIES: No Known Allergies  HOME MEDICATIONS: Outpatient Prescriptions Prior to Visit  Medication Sig Dispense Refill  . benzonatate (TESSALON) 100 MG capsule Take 1 capsule (100 mg total) by mouth every 8 (eight) hours. 21 capsule 0  . HYDROcodone-acetaminophen (NORCO/VICODIN) 5-325 MG per tablet Take 2 tablets by mouth every 4 (four) hours as needed. 10 tablet 0  . insulin NPH-regular Human (NOVOLIN 70/30) (70-30) 100 UNIT/ML injection Inject 35 Units into the skin 2 (two) times daily with a meal. 10 mL 11  . lisinopril (PRINIVIL,ZESTRIL) 20 MG tablet Take 1 tablet (20 mg total) by mouth daily. For kidney protection 60 tablet 2  . metFORMIN (GLUCOPHAGE) 1000 MG tablet Take 1 tablet (1,000 mg total) by mouth 2 (two) times daily. 60 tablet 12  . simvastatin (ZOCOR) 40 MG tablet Take 1 tablet (40 mg total) by mouth daily. 30 tablet 3  . aspirin 81 MG EC tablet Take 81 mg by mouth daily.      . ferrous sulfate 325 (65 FE) MG tablet Take 1 tablet (325 mg total) by mouth daily with breakfast. (Patient not taking: Reported on 01/21/2014) 60 tablet 3  . ibuprofen (ADVIL,MOTRIN) 200 MG tablet Take 200 mg by mouth every 6 (six) hours as needed for mild pain.    . naproxen (NAPROSYN) 500 MG tablet Take 1 tablet (500 mg total) by mouth 2 (two) times daily with a  meal. 30 tablet 0  . naproxen sodium (ANAPROX) 220 MG tablet Take 220 mg by mouth 2 (two) times daily with a meal.     No facility-administered medications prior to visit.    PAST MEDICAL HISTORY: Past Medical History  Diagnosis Date  . Diabetes mellitus without complication   . Hypertension   . High cholesterol   . Migraine   . Depression   . Arthritis   . Alcohol abuse   . Polysubstance abuse     alcohol and cocaine  . Polyneuropathy in diabetes(357.2)   . Memory disorder 08/22/2013  . CHF (congestive heart failure)     PAST SURGICAL HISTORY: Past Surgical History  Procedure Laterality Date  . Eye surgery  04/2012    detatched retna, bilateral  . Eye surgery  08/2012  . Cataract extraction      bilaterally    FAMILY HISTORY: Family History  Problem Relation Age of Onset  . Alzheimer's disease Brother   . Cancer Father   . Hypertension Father   . Heart attack Father   . Hypertension Mother   .  Gout Mother   . Diabetes Sister   . Hypertension Sister   . Hypertension Sister   . Hypertension Sister     SOCIAL HISTORY: History   Social History  . Marital Status: Married    Spouse Name: Keith Holloway  . Number of Children: 0  . Years of Education: 11   Occupational History  . Disabled    Social History Main Topics  . Smoking status: Current Some Day Smoker -- 0.50 packs/day    Types: Cigarettes    Last Attempt to Quit: 04/16/2011  . Smokeless tobacco: Never Used     Comment: just recently began to smoke again after quitting in 2013, 3 cigarettes a week  . Alcohol Use: 1.2 oz/week    2 Cans of beer per week     Comment: states occassionally drinks beer  . Drug Use: No  . Sexual Activity: Not on file   Other Topics Concern  . Not on file   Social History Narrative   Patient is married Keith Holloway) and lives at home with his wife.   Patient is disabled.   Patient has a high school education.   Patient is right-handed.   Patient drinks 3-4 cups of coffee  most days.      PHYSICAL EXAM  Filed Vitals:   05/23/14 0856  BP: 130/78  Pulse: 107  Height: 5\' 11"  (1.803 m)  Weight: 197 lb (89.359 kg)   Body mass index is 27.49 kg/(m^2).  Generalized: Well developed, in no acute distress   Neurological examination  Mentation: Alert oriented to time, place, history taking. Follows all commands speech and language fluent Cranial nerve II-XII: Pupils were equal round reactive to light. Extraocular movements were full. Patient is legally blind but can see shadows. Facial sensation and strength were normal. Uvula tongue midline. Head turning and shoulder shrug  were normal and symmetric. Motor: The motor testing reveals 5 over 5 strength of all 4 extremities. Good symmetric motor tone is noted throughout.  Sensory: Sensory testing is intact to soft touch on all 4 extremities. No evidence of extinction is noted.  Coordination: Cerebellar testing reveals good finger-nose-finger and heel-to-shin bilaterally.  Gait and station: Gait is normal.   Reflexes: Deep tendon reflexes are symmetric and normal bilaterally.    DIAGNOSTIC DATA (LABS, IMAGING, TESTING) - I reviewed patient records, labs, notes, testing and imaging myself where available.  Lab Results  Component Value Date   WBC 12.4* 05/05/2014   HGB 12.0* 05/05/2014   HCT 35.9* 05/05/2014   MCV 66.2* 05/05/2014   PLT 224 05/05/2014      Component Value Date/Time   NA 136 05/05/2014 2010   K 4.0 05/05/2014 2010   CL 102 05/05/2014 2010   CO2 28 05/05/2014 2010   GLUCOSE 184* 05/05/2014 2010   BUN 14 05/05/2014 2010   CREATININE 1.26 05/05/2014 2010   CREATININE 1.02 01/23/2014 1313   CALCIUM 9.4 05/05/2014 2010   PROT 8.0 05/05/2014 2049   ALBUMIN 3.7 05/05/2014 2049   AST 20 05/05/2014 2049   ALT 24 05/05/2014 2049   ALKPHOS 108 05/05/2014 2049   BILITOT 0.5 05/05/2014 2049   GFRNONAA 63* 05/05/2014 2010   GFRNONAA 84 01/23/2014 1313   GFRAA 73* 05/05/2014 2010   GFRAA >89  01/23/2014 1313   Lab Results  Component Value Date   CHOL 112 03/28/2013   HDL 33* 03/28/2013   LDLCALC 56 03/28/2013   LDLDIRECT 105* 09/10/2009   TRIG 117 03/28/2013   CHOLHDL  3.4 03/28/2013   Lab Results  Component Value Date   HGBA1C 8.3 01/23/2014   Lab Results  Component Value Date   ENMMHWKG88 110 07/31/2013   Lab Results  Component Value Date   TSH 1.863 03/28/2013      ASSESSMENT AND PLAN 54 y.o. year old male  has a past medical history of Diabetes mellitus without complication; Hypertension; High cholesterol; Migraine; Depression; Arthritis; Alcohol abuse; Polysubstance abuse; Polyneuropathy in diabetes(357.2); Memory disorder (08/22/2013); and CHF (congestive heart failure). here with:  1. Memory loss  The patient's memory has remained the same. His MMSE is 22/30 was previously 22/30. At this time the patient and his wife would like to wait before considering starting medication such as Aricept. They would like to see what the treatment will be for his cancer diagnosis first. I am amendable to this. They will let me know if they would like to start Aricept in the future. If his symptoms worsen or he develops new symptoms he'll let us know. Otherwise he will follow-up in 6 months or sooner if needed.  Keith Givens, MSN, NP-C 05/23/2014, 9:18 AM [facility] [address], [place], [place] [address] [phone]  Note: This document was prepared with digital dictation and possible smart phrase technology. Any transcriptional errors that result from this process are unintentional.

## 2014-05-23 NOTE — Progress Notes (Signed)
I have read the note, and I agree with the clinical assessment and plan.  Delrose Rohwer KEITH   

## 2014-05-29 ENCOUNTER — Encounter (HOSPITAL_COMMUNITY)
Admission: RE | Admit: 2014-05-29 | Discharge: 2014-05-29 | Disposition: A | Discharge status: Home / Self Care | Payer: Medicaid Other | Source: Ambulatory Visit | Attending: Pulmonary Disease | Admitting: Pulmonary Disease

## 2014-05-29 ENCOUNTER — Ambulatory Visit (HOSPITAL_COMMUNITY)
Admission: RE | Admit: 2014-05-29 | Discharge: 2014-05-29 | Disposition: A | Discharge status: Home / Self Care | Payer: Medicaid Other | Source: Ambulatory Visit | Attending: Pulmonary Disease | Admitting: Pulmonary Disease

## 2014-05-29 DIAGNOSIS — R413 Other amnesia: Secondary | ICD-10-CM | POA: Insufficient documentation

## 2014-05-29 DIAGNOSIS — C3432 Malignant neoplasm of lower lobe, left bronchus or lung: Secondary | ICD-10-CM | POA: Insufficient documentation

## 2014-05-29 DIAGNOSIS — Z08 Encounter for follow-up examination after completed treatment for malignant neoplasm: Secondary | ICD-10-CM | POA: Insufficient documentation

## 2014-05-29 LAB — GLUCOSE, CAPILLARY: GLUCOSE-CAPILLARY: 72 mg/dL (ref 70–99)

## 2014-05-29 MED ORDER — GADOBENATE DIMEGLUMINE 529 MG/ML IV SOLN
18.0000 mL | Freq: Once | INTRAVENOUS | Status: AC | PRN
  Administered 2014-05-29: 18 mL via INTRAVENOUS

## 2014-05-29 MED ORDER — FLUDEOXYGLUCOSE F - 18 (FDG) INJECTION
10.4000 | Freq: Once | INTRAVENOUS | Status: AC | PRN
  Administered 2014-05-29: 10.4 via INTRAVENOUS

## 2014-06-07 ENCOUNTER — Other Ambulatory Visit: Payer: Self-pay | Admitting: *Deleted

## 2014-06-07 ENCOUNTER — Telehealth: Payer: Self-pay | Admitting: *Deleted

## 2014-06-07 DIAGNOSIS — R918 Other nonspecific abnormal finding of lung field: Secondary | ICD-10-CM | POA: Insufficient documentation

## 2014-06-07 NOTE — Telephone Encounter (Signed)
Received referral on Keith Holloway.  I have scheduled him to be seen at thoracic clinic on 06/13/14 arrive at 12:30.

## 2014-06-10 ENCOUNTER — Encounter: Payer: Self-pay | Admitting: *Deleted

## 2014-06-10 ENCOUNTER — Other Ambulatory Visit: Payer: Self-pay | Admitting: *Deleted

## 2014-06-10 DIAGNOSIS — R918 Other nonspecific abnormal finding of lung field: Secondary | ICD-10-CM

## 2014-06-10 NOTE — [facility] Oncology Navigator Note (Unsigned)
Called Dr. Verdie Mosher to request records to be faxed to me.  Also, called [facility] to request tissue [facility].  They asked that I fill out a request form.  I will

## 2014-06-10 NOTE — [facility] Oncology Navigator Note (Unsigned)
Called referring office to obtain records on patient.  Also, called and requested pathology to be sent to [facility].  Request faxed

## 2014-06-12 ENCOUNTER — Telehealth: Payer: Self-pay | Admitting: *Deleted

## 2014-06-12 NOTE — Telephone Encounter (Signed)
Called and spoke w/ pt's wife & confirmed 06/13/14 clinic appt w/ her and gave directions and instructions.

## 2014-06-13 ENCOUNTER — Ambulatory Visit (HOSPITAL_BASED_OUTPATIENT_CLINIC_OR_DEPARTMENT_OTHER): Payer: Medicaid Other | Admitting: Internal Medicine

## 2014-06-13 ENCOUNTER — Encounter: Payer: Self-pay | Admitting: *Deleted

## 2014-06-13 ENCOUNTER — Other Ambulatory Visit (HOSPITAL_BASED_OUTPATIENT_CLINIC_OR_DEPARTMENT_OTHER): Payer: Medicaid Other

## 2014-06-13 ENCOUNTER — Encounter: Payer: Self-pay | Admitting: Internal Medicine

## 2014-06-13 ENCOUNTER — Other Ambulatory Visit: Payer: Medicaid Other

## 2014-06-13 ENCOUNTER — Ambulatory Visit: Payer: Medicaid Other | Attending: Internal Medicine | Admitting: Physical Therapy

## 2014-06-13 ENCOUNTER — Ambulatory Visit
Admission: RE | Admit: 2014-06-13 | Discharge: 2014-06-13 | Disposition: A | Discharge status: Home / Self Care | Payer: Medicaid Other | Source: Ambulatory Visit | Attending: Radiation Oncology | Admitting: Radiation Oncology

## 2014-06-13 VITALS — BP 127/79 | HR 94 | Temp 98.2°F | Resp 19 | Ht 71.0 in | Wt 187.7 lb

## 2014-06-13 DIAGNOSIS — C3490 Malignant neoplasm of unspecified part of unspecified bronchus or lung: Secondary | ICD-10-CM | POA: Insufficient documentation

## 2014-06-13 DIAGNOSIS — C7802 Secondary malignant neoplasm of left lung: Secondary | ICD-10-CM | POA: Diagnosis not present

## 2014-06-13 DIAGNOSIS — Z720 Tobacco use: Secondary | ICD-10-CM

## 2014-06-13 DIAGNOSIS — C7951 Secondary malignant neoplasm of bone: Secondary | ICD-10-CM | POA: Diagnosis not present

## 2014-06-13 DIAGNOSIS — Z9181 History of falling: Secondary | ICD-10-CM | POA: Insufficient documentation

## 2014-06-13 DIAGNOSIS — R0602 Shortness of breath: Secondary | ICD-10-CM

## 2014-06-13 DIAGNOSIS — I10 Essential (primary) hypertension: Secondary | ICD-10-CM

## 2014-06-13 DIAGNOSIS — R918 Other nonspecific abnormal finding of lung field: Secondary | ICD-10-CM

## 2014-06-13 DIAGNOSIS — C801 Malignant (primary) neoplasm, unspecified: Secondary | ICD-10-CM

## 2014-06-13 DIAGNOSIS — C3492 Malignant neoplasm of unspecified part of left bronchus or lung: Secondary | ICD-10-CM

## 2014-06-13 DIAGNOSIS — E119 Type 2 diabetes mellitus without complications: Secondary | ICD-10-CM

## 2014-06-13 DIAGNOSIS — C787 Secondary malignant neoplasm of liver and intrahepatic bile duct: Secondary | ICD-10-CM | POA: Diagnosis not present

## 2014-06-13 DIAGNOSIS — R5381 Other malaise: Secondary | ICD-10-CM

## 2014-06-13 LAB — CBC WITH DIFFERENTIAL/PLATELET
BASO%: 0.8 % (ref 0.0–2.0)
Basophils Absolute: 0.1 10*3/uL (ref 0.0–0.1)
EOS%: 1 % (ref 0.0–7.0)
Eosinophils Absolute: 0.1 10*3/uL (ref 0.0–0.5)
HCT: 36.4 % — ABNORMAL LOW (ref 38.4–49.9)
HGB: 11.4 g/dL — ABNORMAL LOW (ref 13.0–17.1)
LYMPH%: 9.3 % — ABNORMAL LOW (ref 14.0–49.0)
MCH: 21 pg — ABNORMAL LOW (ref 27.2–33.4)
MCHC: 31.4 g/dL — ABNORMAL LOW (ref 32.0–36.0)
MCV: 66.9 fL — ABNORMAL LOW (ref 79.3–98.0)
MONO#: 1 10*3/uL — ABNORMAL HIGH (ref 0.1–0.9)
MONO%: 9.1 % (ref 0.0–14.0)
NEUT#: 8.8 10*3/uL — ABNORMAL HIGH (ref 1.5–6.5)
NEUT%: 79.8 % — ABNORMAL HIGH (ref 39.0–75.0)
Platelets: 269 10*3/uL (ref 140–400)
RBC: 5.44 10*6/uL (ref 4.20–5.82)
RDW: 14.6 % (ref 11.0–14.6)
WBC: 11 10*3/uL — ABNORMAL HIGH (ref 4.0–10.3)
lymph#: 1 10*3/uL (ref 0.9–3.3)

## 2014-06-13 LAB — COMPREHENSIVE METABOLIC PANEL (CC13)
ALBUMIN: 3.9 g/dL (ref 3.5–5.0)
ALT: 21 U/L (ref 0–55)
AST: 19 U/L (ref 5–34)
Alkaline Phosphatase: 121 U/L (ref 40–150)
Anion Gap: 14 mEq/L — ABNORMAL HIGH (ref 3–11)
BUN: 31.4 mg/dL — ABNORMAL HIGH (ref 7.0–26.0)
CHLORIDE: 103 meq/L (ref 98–109)
CO2: 22 mEq/L (ref 22–29)
CREATININE: 2.1 mg/dL — AB (ref 0.7–1.3)
Calcium: 10.2 mg/dL (ref 8.4–10.4)
EGFR: 41 mL/min/{1.73_m2} — ABNORMAL LOW (ref >90)
Glucose: 244 mg/dl — ABNORMAL HIGH (ref 70–140)
POTASSIUM: 4.9 meq/L (ref 3.5–5.1)
SODIUM: 139 meq/L (ref 136–145)
Total Bilirubin: 0.23 mg/dL (ref 0.20–1.20)
Total Protein: 8.1 g/dL (ref 6.4–8.3)

## 2014-06-13 MED ORDER — PROCHLORPERAZINE MALEATE 10 MG PO TABS: 10.0000 mg | ORAL_TABLET | Freq: Four times a day (QID) | ORAL | Status: DC | PRN

## 2014-06-13 NOTE — Progress Notes (Signed)
Radiation Oncology         [phone] ________________________________  Initial Outpatient Consultation  Name: Keith Holloway MRN: [id number]  Date: 06/13/2014  DOB: 1960-03-22  CC:No PCP Per Patient  Kathyrn Lass, MD   REFERRING PHYSICIAN: Kathyrn Lass, MD  DIAGNOSIS:  Stage IV Squamous cell lung cancer   HISTORY OF PRESENT ILLNESS::Keith Holloway is a 54 y.o. male who is here for a consult.  Seen at the courtesy of Dr. Gwenevere Ghazi. Presented with some pain within the chest area as well as cough and some dyspnea Denies hemoptysis. Reports dry cough and front chest pain. Cough medicine manages the cough. The patient underwent workup in the [facility] area including biopsy and PET scan. He has been found to have stage IV squamous cell carcinoma lung. The patient wishes to receive his therapy here [facility] where he resides.  PREVIOUS RADIATION THERAPY: No  PAST MEDICAL HISTORY:  has a past medical history of Diabetes mellitus without complication; Hypertension; High cholesterol; Migraine; Depression; Arthritis; Alcohol abuse; Polysubstance abuse; Polyneuropathy in diabetes(357.2); Memory disorder (08/22/2013); and CHF (congestive heart failure).  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 Eye surgery  04/2012    detatched retna, bilateral  . Eye surgery  08/2012  . Cataract extraction      bilaterally    FAMILY HISTORY: family history includes Alzheimer's disease in his brother; Cancer in his father; Diabetes in his sister; Gout in his mother; Heart attack in his father; Hypertension in his father, mother, sister, sister, and sister.  SOCIAL HISTORY:  reports that he has been smoking Cigarettes.  He has been smoking about 0.50 packs per day. He has never used smokeless tobacco. He reports that he drinks about 1.2 oz of alcohol per week. He reports that he does not use illicit drugs.  ALLERGIES:  Review of patient's allergies indicates no known allergies.  MEDICATIONS:  Current Outpatient Prescriptions  Medication Sig Dispense Refill  . benzonatate (TESSALON) 100 MG capsule Take 1 capsule (100 mg total) by mouth every 8 (eight) hours. 21 capsule 0  . HYDROcodone-acetaminophen (NORCO/VICODIN) 5-325 MG per tablet Take 2 tablets by mouth every 4 (four) hours as needed. 10 tablet 0  . insulin NPH-regular Human (NOVOLIN 70/30) (70-30) 100 UNIT/ML injection Inject 35 Units into the skin 2 (two) times daily with a meal. 10 mL 11  . lisinopril (PRINIVIL,ZESTRIL) 20 MG tablet Take 1 tablet (20 mg total) by mouth daily. For kidney protection 60 tablet 2  . metFORMIN (GLUCOPHAGE) 1000 MG tablet Take 1 tablet (1,000 mg total) by mouth 2 (two) times daily. 60 tablet 12  . prochlorperazine (COMPAZINE) 10 MG tablet Take 1 tablet (10 mg total) by mouth every 6 (six) hours as needed for nausea or vomiting. 30 tablet 0  . simvastatin (ZOCOR) 40 MG tablet Take 1 tablet (40 mg total) by mouth daily. 30 tablet 3   No current facility-administered medications for this encounter.    REVIEW OF SYSTEMS:  A 15 point review of systems is documented in the electronic medical record. This was obtained by the nursing staff. However, I reviewed this with the patient to discuss relevant findings and make appropriate changes. Denies hemoptysis. Reports dry cough and front chest pain. Cough medicine manages the cough. Taking deep breaths, causes discomfort. Denies pneumonia. Pt blind since 0347, as a complication of diabetes. Pt reports pain in both shoulders, to the back, and to the fingers.  Denies any recent headaches. Pt reports loss of appetite and weight loss. Denies any headaches or focal motor weakness.    PHYSICAL EXAM:  Vitals - 1 value per visit 1/61/0960  SYSTOLIC 454  DIASTOLIC 79  Pulse 94  Temperature 98.2  Respirations 19  Weight (lb) 187.7  Height '5\' 11"'$   BMI 26.19  VISIT REPORT   Lungs are  clear. Heart has regular rate and rhythm. No palpable cervical, supraclavicular, or axillary adenopathy. 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 The neurological examination is nonfocal.      ECOG = 1    1 - Symptomatic but completely ambulatory (Restricted in physically strenuous activity but ambulatory and able to carry out work of a light or sedentary nature. For example, light housework, office work)  LABORATORY DATA:  Lab Results  Component Value Date   WBC 11.0* 06/13/2014   HGB 11.4* 06/13/2014   HCT 36.4* 06/13/2014   MCV 66.9* 06/13/2014   PLT 269 06/13/2014   NEUTROABS 8.8* 06/13/2014   Lab Results  Component Value Date   NA 139 06/13/2014   K 4.9 06/13/2014   CL 102 05/05/2014   CO2 22 06/13/2014   GLUCOSE 244* 06/13/2014   CREATININE 2.1* 06/13/2014   CALCIUM 10.2 06/13/2014      RADIOGRAPHY: Mr Jeri Cos Wo Contrast  05/29/2014   CLINICAL DATA:  History of memory loss and alcohol abuse for follow-up. History of lung cancer. Subsequent encounter.  EXAM: MRI HEAD WITHOUT AND WITH CONTRAST  TECHNIQUE: Multiplanar, multiecho pulse sequences of the brain and surrounding structures were obtained without and with intravenous contrast.  CONTRAST:  75m MULTIHANCE GADOBENATE DIMEGLUMINE 529 MG/ML IV SOLN  COMPARISON:  MR brain 02/17/2014.  FINDINGS: No evidence for acute infarction, hemorrhage, mass lesion, hydrocephalus, or extra-axial fluid. Mild premature for age cerebral and cerebellar atrophy. Mild subcortical and periventricular T2 and FLAIR hyperintensities, likely chronic microvascular ischemic change. Flow voids are maintained throughout the carotid, basilar, and vertebral arteries. There are no areas of chronic hemorrhage. Pituitary, pineal, and cerebellar tonsils unremarkable. No upper cervical lesions. Visualized calvarium, skull base, and upper cervical osseous structures unremarkable. Scalp and extracranial soft tissues, orbits, sinuses, and mastoids show no acute process.  Post  infusion, no abnormal enhancement of the brain or meninges. Specifically no features suggestive of intracranial metastatic disease.  IMPRESSION: Stable exam. Chronic changes as described. No acute intracranial abnormality. No evidence for metastatic disease to the brain or surrounding structures.   Electronically Signed   By: JRolla FlattenM.D.   On: 05/29/2014 17:04   Nm Pet Image Initial (pi) Skull Base To Thigh  05/29/2014   CLINICAL DATA:  Initial treatment strategy for left-sided lung mass with mediastinal and left hilar adenopathy.  EXAM: NUCLEAR MEDICINE PET SKULL BASE TO THIGH  TECHNIQUE: 10.4 mCi F-18 FDG was injected intravenously. Full-ring PET imaging was performed from the skull base to thigh after the radiotracer. CT data was obtained and used for attenuation correction and anatomic localization.  FASTING BLOOD GLUCOSE:  Value: 70 T0 mg/dl  COMPARISON:  Chest abdomen and pelvic CTs of 05/05/2014.  FINDINGS: NECK  No areas of abnormal hypermetabolism.  CHEST  Hypermetabolic prevascular nodal mass measures 8.3 x 5.6 cm and a S.U.V. max of 16.7.  Soft tissue mass centered about the left lower lobe bronchi is favored to be pulmonary parenchymal in origin. Measures 4.4 cm and a S.U.V. max of 16.1 on image 43.  Bilateral hypermetabolic pulmonary nodules. Nodule along the left upper  lobe anterior segmental bronchus measures 2.5 cm and a S.U.V. max of 15.5.  ABDOMEN/PELVIS  Bilateral hepatic metastasis. A relatively CT occult lesion measures a S.U.V. max of 8.0 in the right hepatic dome on image 82.  Lesion in the far lateral segment of the left lobe of the liver measures a S.U.V. max of 6.7 on image 95.  Low anal hypermetabolism is favored to be physiologic. This measures a S.U.V. max of 20.6.  SKELETON  Left sternal lesion measures a S.U.V. max of 9.7 on image 51.  CT IMAGES PERFORMED FOR ATTENUATION CORRECTION  No significant findings within the neck. Chest, abdomen, and pelvic findings deferred to recent  diagnostic CTs. Small left pleural effusion is decreased. The right pleural effusion is resolved. Small pericardial effusion is similar to minimally decreased.  IMPRESSION: 1. Extensive thoracic adenopathy with bilateral pulmonary nodules. Favor widespread metastatic disease from primary bronchogenic carcinoma (likely in the central left lower lobe). 2. Small volume osseous and hepatic metastasis. 3. Likely physiologic low anal hypermetabolism. Consider physical exam correlation.   Electronically Signed   By: Abigail Miyamoto M.D.   On: 05/29/2014 15:04      IMPRESSION: Stage IV squamous cell lung cancer. Cough is well controlled with cough medicine at this time. Pain is minimal. Do not see any immediate need for palliative radiation therapy. Pt will proceed under systemic chemo under the direction of Dr. Julien Nordmann in the near future.  PLAN: Add radiation treatment if there is still pain after chemotherapy or if new symptoms develop.    I spent 45 minutes minutes face to face with the patient and more than 50% of that time was spent in counseling and/or coordination of care.  This document serves as a record of services personally performed by Gery Pray, MD. It was created on his behalf by Arlyce Harman, a trained medical scribe. The creation of this record is based on the scribe's personal observations and the provider's statements to them. This document has been checked and approved by the attending provider.   ------------------------------------------------  Blair Promise, PhD, MD

## 2014-06-13 NOTE — [facility] Oncology Navigator Note (Unsigned)
   Thoracic Treatment Summary Name:Merton Bou Date:06/13/2014 DOB:October 29, 1960 Your Medical Team Medical Oncologist:Dr. Mohamed  Type and Stage of Lung Cancer Non-Small Cell Carcinoma: Squamous Cell   Clinical Stage:  Non-small cell carcinoma of lung, stage 4   Staging form: Lung, AJCC 7th Edition     Clinical stage from 06/13/2014: Stage IV (T1b, N3, M1b) - Signed by Curt Bears, MD on 06/13/2014       Staging comments: Squamous cell carcinoma.     Clinical stage is based on radiology exams.  Pathological stage will be determined after surgery.  Staging is based on the size of the tumor, involvement of lymph nodes or not, and whether or not the cancer center has spread. Recommendations Recommendations: Chemotherapy  These recommendations are based on information available as of today's consult.  This is subject to change depending further testing or exams. Next Steps Next Step: Medical Oncology will set up follow up appointments  5/2 Chemotherapy Education Class Barriers to Care What do you perceive as a potential barrier that may prevent you from receiving your treatment plan? Education-information given and explained on lung cancer and resources at [employer] update FA of treatment plan   Resources Given: [place] on [place] at [email or web address] [phone] What to expect at [facility] information  Fall Risk Information given and explained  [place] booklet on healthy eating   Questions Norton Blizzard, RN BSN Thoracic Oncology Nurse Navigator at [facility] [place] is a nurse navigator that is available to assist you through your cancer journey.  She can answer your questions and/or provide resources regarding your treatment plan, emotional support, or financial concerns.

## 2014-06-13 NOTE — Therapy (Signed)
[place], [place], [address] Phone: [phone]   Fax:  [phone]  Physical Therapy Evaluation  Patient Details  Name: Keith Holloway MRN: [id number] Date of Birth: 1960/07/15 Referring Provider:  Curt Bears, MD  Encounter Date: 06/13/2014      PT End of Session - 06/13/14 1414    Visit Number 1   Number of Visits 1   PT Start Time 1340   PT Stop Time 1400   PT Time Calculation (min) 20 min   Activity Tolerance Patient tolerated treatment well   Behavior During Therapy [facility] for tasks assessed/performed      Past Medical History  Diagnosis Date  . Diabetes mellitus without complication   . Hypertension   . High cholesterol   . Migraine   . Depression   . Arthritis   . Alcohol abuse   . Polysubstance abuse     alcohol and cocaine  . Polyneuropathy in diabetes(357.2)   . Memory disorder 08/22/2013  . CHF (congestive heart failure)     Past Surgical History  Procedure Laterality Date  . Eye surgery  04/2012    detatched retna, bilateral  . Eye surgery  08/2012  . Cataract extraction      bilaterally    There were no vitals filed for this visit.  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 expected to have chemotherapy and probably radiation therapy for pulmonary obstruction   Currently in Pain? Yes   Pain Score 7    Pain Location Chest  also shoulders, upper back  at times   Pain Orientation Anterior   Pain Descriptors / Indicators Constant   Aggravating Factors  as the day goes on   Pain Relieving Factors meds            [facility] PT Assessment - 06/13/14 0001    Assessment   Medical Diagnosis left lower lobe mass, non-small cell lung CA, stage IV with bony mets   Onset Date 05/05/14   Precautions   Precautions Other (comment)   Precaution Comments legally blind; bony mets, cancer precautions   Restrictions   Weight Bearing Restrictions No   Balance Screen   Has the patient fallen in the past 6 months Yes   How many times? once  rolled out of bed partway to floor   Has the patient had a decrease in activity level because of a fear of falling?  No   Is the patient reluctant to leave their home because of a fear of falling?  No   Home Environment   Living Enviornment Private residence   Living Arrangements Spouse/significant other   Type of [place] Multi-level   Alternate Level Stairs-Number of Steps 1   Alternate Level Stairs-Rails --  yes   Prior Function   Level of Independence Needs assistance with gait;Needs assistance with homemaking   Leisure no regular exercise   Observation/Other Assessments  Observations thin black male in no acute distress   Sensation   Light Touch Not tested  reports tingling in hands and arms from neuropathy   Functional Tests   Functional tests Sit to Stand  SOB and tired legs after this test   Sit to Stand   Comments 11 times in 30 seconds   Posture/Postural Control   Posture/Postural Control Postural limitations   Postural Limitations Increased lumbar lordosis   ROM / Strength   AROM / PROM / Strength AROM;Strength   AROM   Overall AROM  Within functional limits for tasks performed   Overall AROM Comments standing trunk AROM assessed   Strength   Overall Strength Unable to assess  due to bony mets   Overall Strength Comments patient reports feeling strong   Ambulation/Gait    Ambulation/Gait Yes   Ambulation/Gait Assistance 5: Supervision   Ambulation/Gait Assistance Details legally blind; uses white cane, but needs assist outside of the home   Ambulation Distance (Feet) --  limited by shortness of breath   Assistive device Other (Comment)   Gait Pattern --  white cane for visually impaired   Balance   Balance Assessed Yes   Dynamic Standing Balance   Dynamic Standing - Comments reaches approx. 7 inches forward in standing                           PT Education - 2014/06/21 1413    Education provided Yes   Education Details energy conservation, walking or other exercise such as stationary biking, posture, breathing   Person(s) Educated Patient;Spouse;Child(ren);Other (comment)  sister   Methods Explanation;Handout   Comprehension Verbalized understanding               Lung Clinic Goals - 06-21-2014 1422    Patient will be able to verbalize understanding of the benefit of exercise to decrease fatigue.   Status Achieved   Patient will be able to verbalize the importance of posture.   Status Achieved   Patient will be able to demonstrate diaphragmatic breathing for improved lung function.   Status Achieved   Patient will be able to verbalize understanding of the role of physical therapy to prevent functional decline and who to contact if physical therapy is needed.   Status Achieved             Plan - 2014/06/21 1416    Clinical Impression Statement Patient who is legally blind and now diagnosed with lung cancer, stage IV, has limited endurance and muscle fatigue with functional sit to stand test.  Also fell part way out of bed recently.   Rehab Potential Fair   PT Frequency One time visit   PT Treatment/Interventions Patient/family education   PT Next Visit Plan None at this time   PT Home Exercise Plan see education section   Consulted and Agree with Plan of Care Patient;Family member/caregiver          G-Codes  - 06/21/14 1422    Functional Assessment Tool Used clinical judgement   Functional Limitation Mobility: Walking and moving around   Mobility: Walking and Moving Around Current Status [phone]) At least 20 percent but less than 40 percent impaired, limited or restricted   Mobility: Walking and Moving Around Goal Status ([id number]) At least 20 percent but less than 40 percent impaired, limited or restricted   Mobility: Walking and Moving Around Discharge Status [phone]) At least 20 percent but less than 40 percent  impaired, limited or restricted       Problem List Patient Active Problem List   Diagnosis Date Noted  . Non-small cell carcinoma of lung, stage 4 06/13/2014  . Lung mass 06/07/2014  . Other specified diabetes mellitus without complications 69/62/9528  . Essential hypertension, benign 01/23/2014  . Memory disorder 08/22/2013  . Alcohol abuse 07/31/2013  . Chest tightness or pressure 05/10/2013  . DIABETES MELLITUS II, UNCOMPLICATED 41/32/4401  . HYPERLIPIDEMIA 04/14/2006  . ALCOHOL ABUSE, UNSPECIFIED 04/14/2006    Boykin Baetz 06/13/2014, 2:24 PM  [place], [place], [address] Phone: [phone]   Fax:  [place], PT 06/13/2014 2:25 PM

## 2014-06-13 NOTE — Progress Notes (Signed)
[place] Telephone:(336) [phone]   Fax:(336) [phone] Multidisciplinary thoracic oncology clinic  CONSULT NOTE  REFERRING PHYSICIAN: Dr. Gwenevere Ghazi  REASON FOR CONSULTATION:  54 years old African-American male recently diagnosed with lung cancer.  HPI Keith Holloway is a 54 y.o. male is a legally blind with past medical history significant for hypertension, diabetes mellitus, dyslipidemia, alcohol and tobacco abuse as well as dementia. The patient mentions that in March 2016 he started having pain on the right side of the chest as well as cough. He was evaluated at the emergency department at [facility] and chest x-ray performed on 05/05/2014 showed left basilar airspace opacity concerning for pneumonia. CT scan of the chest, abdomen and pelvis with contrast performed on the same day showed extensive bulky malignant appearing adenopathy in the mediastinum including 6.0 x 8.5 x 6.5 cm anterior mediastinal mass directly above the pulmonary Route filling the retrosternal space. There is contiguous extension of soft tissue out into the left hilum. There is a 4.5 cm mass just inferior to the left hilum which could represent a primary neoplasm. Multiple pulmonary nodules are present bilaterally, likely hematogenous metastases. These measure up to 1.8 cm in size. There was also small pleural and pericardial effusions. The patient was referred to his pulmonologist Dr. Verdie Mosher and bronchoscopy was performed on 05/09/2014 and the brushing as well as the left lower lobe biopsy showed malignant cells consistent with poorly differentiated squamous cell carcinoma.  A PET scan was performed on 05/29/2014 and it showed extensive thoracic adenopathy with bilateral pulmonary nodules. Favor widespread metastatic disease from primary bronchogenic carcinoma likely in the central left lower lobe. There was likely physiologic lower anal hypermetabolism. There was also small volume osseous  and hepatic metastasis. MRI of the brain showed stable disease with no acute intracranial abnormality and no evidence for metastatic disease to the brain. Dr. be referred kindly referred the patient to the multidisciplinary thoracic oncology clinic today for evaluation and recommendation regarding treatment of his condition. When seen today the patient continues to have pain in the substernal area as well as the shoulder and lower back. He also has shortness breath with exertion and mild cough with no hemoptysis. He lost around 21 pounds over the last 4 weeks. The patient denied having any headache.  Family history significant for a mother and father with hypertension. The patient is married and has no biologic children. He was accompanied today by his wife, Linton Rump, his stepdaughter, Alfredo Martinez and his sister Inez Catalina. The patient has a history of smoking 1 pack per day for around 40 years and unfortunately continues to smoke and I strongly encouraged him to quit smoking. He also has a history of alcohol abuse and quit 2 weeks ago. No history of drug abuse.   HPI  Past Medical History  Diagnosis Date  . Diabetes mellitus without complication   . Hypertension   . High cholesterol   . Migraine   . Depression   . Arthritis   . Alcohol abuse   . Polysubstance abuse     alcohol and cocaine  . Polyneuropathy in diabetes(357.2)   . Memory disorder 08/22/2013  . CHF (congestive heart failure)     Past Surgical History  Procedure Laterality Date  . Eye surgery  04/2012    detatched retna, bilateral  . Eye surgery  08/2012  . Cataract extraction      bilaterally    Family History  Problem Relation Age of Onset  . Alzheimer's  disease Brother   . Cancer Father   . Hypertension Father   . Heart attack Father   . Hypertension Mother   . Gout Mother   . Diabetes Sister   . Hypertension Sister   . Hypertension Sister   . Hypertension Sister     Social History History  Substance Use Topics    . Smoking status: Current Some Day Smoker -- 0.50 packs/day    Types: Cigarettes    Last Attempt to Quit: 04/16/2011  . Smokeless tobacco: Never Used     Comment: just recently began to smoke again after quitting in 2013, 3 cigarettes a week  . Alcohol Use: 1.2 oz/week    2 Cans of beer per week     Comment: states occassionally drinks beer    No Known Allergies  Current Outpatient Prescriptions  Medication Sig Dispense Refill  . benzonatate (TESSALON) 100 MG capsule Take 1 capsule (100 mg total) by mouth every 8 (eight) hours. 21 capsule 0  . HYDROcodone-acetaminophen (NORCO/VICODIN) 5-325 MG per tablet Take 2 tablets by mouth every 4 (four) hours as needed. 10 tablet 0  . insulin NPH-regular Human (NOVOLIN 70/30) (70-30) 100 UNIT/ML injection Inject 35 Units into the skin 2 (two) times daily with a meal. 10 mL 11  . lisinopril (PRINIVIL,ZESTRIL) 20 MG tablet Take 1 tablet (20 mg total) by mouth daily. For kidney protection 60 tablet 2  . metFORMIN (GLUCOPHAGE) 1000 MG tablet Take 1 tablet (1,000 mg total) by mouth 2 (two) times daily. 60 tablet 12  . simvastatin (ZOCOR) 40 MG tablet Take 1 tablet (40 mg total) by mouth daily. 30 tablet 3   No current facility-administered medications for this visit.    Review of Systems  Constitutional: positive for anorexia, fatigue and weight loss Eyes: positive for Legally blind in both eyes Ears, nose, mouth, throat, and face: negative Respiratory: positive for cough, dyspnea on exertion and wheezing Cardiovascular: negative Gastrointestinal: negative Genitourinary:negative Integument/breast: negative Hematologic/lymphatic: negative Musculoskeletal:positive for back pain and bone pain Neurological: negative Behavioral/Psych: negative Endocrine: negative Allergic/Immunologic: negative  Physical Exam  DJS:HFWYO, healthy, no distress, well nourished and well developed SKIN: skin color, texture, turgor are normal, no rashes or  significant lesions HEAD: Normocephalic, No masses, lesions, tenderness or abnormalities EYES: normal, PERRLA, Conjunctiva are pink and non-injected EARS: External ears normal, Canals clear OROPHARYNX:no exudate, no erythema and lips, buccal mucosa, and tongue normal  NECK: supple, no adenopathy, no JVD LYMPH:  no palpable lymphadenopathy, no hepatosplenomegaly LUNGS: expiratory wheezes bilaterally HEART: regular rate & rhythm and no murmurs ABDOMEN:abdomen soft, non-tender, normal bowel sounds and no masses or organomegaly BACK: Back symmetric, no curvature., No CVA tenderness EXTREMITIES:no joint deformities, effusion, or inflammation, no edema, no skin discoloration  NEURO: alert & oriented x 3 with fluent speech, no focal motor/sensory deficits  PERFORMANCE STATUS: ECOG 1  LABORATORY DATA: Lab Results  Component Value Date   WBC 11.0* 06/13/2014   HGB 11.4* 06/13/2014   HCT 36.4* 06/13/2014   MCV 66.9* 06/13/2014   PLT 269 06/13/2014      Chemistry      Component Value Date/Time   NA 139 06/13/2014 1224   NA 136 05/05/2014 2010   K 4.9 06/13/2014 1224   K 4.0 05/05/2014 2010   CL 102 05/05/2014 2010   CO2 22 06/13/2014 1224   CO2 28 05/05/2014 2010   BUN 31.4* 06/13/2014 1224   BUN 14 05/05/2014 2010   CREATININE 2.1* 06/13/2014 1224   CREATININE  1.26 05/05/2014 2010   CREATININE 1.02 01/23/2014 1313      Component Value Date/Time   CALCIUM 10.2 06/13/2014 1224   CALCIUM 9.4 05/05/2014 2010   ALKPHOS 121 06/13/2014 1224   ALKPHOS 108 05/05/2014 2049   AST 19 06/13/2014 1224   AST 20 05/05/2014 2049   ALT 21 06/13/2014 1224   ALT 24 05/05/2014 2049   BILITOT 0.23 06/13/2014 1224   BILITOT 0.5 05/05/2014 2049       RADIOGRAPHIC STUDIES: Mr Jeri Cos Wo Contrast  2014-06-02   CLINICAL DATA:  History of memory loss and alcohol abuse for follow-up. History of lung cancer. Subsequent encounter.  EXAM: MRI HEAD WITHOUT AND WITH CONTRAST  TECHNIQUE: Multiplanar,  multiecho pulse sequences of the brain and surrounding structures were obtained without and with intravenous contrast.  CONTRAST:  86m MULTIHANCE GADOBENATE DIMEGLUMINE 529 MG/ML IV SOLN  COMPARISON:  MR brain 02/17/2014.  FINDINGS: No evidence for acute infarction, hemorrhage, mass lesion, hydrocephalus, or extra-axial fluid. Mild premature for age cerebral and cerebellar atrophy. Mild subcortical and periventricular T2 and FLAIR hyperintensities, likely chronic microvascular ischemic change. Flow voids are maintained throughout the carotid, basilar, and vertebral arteries. There are no areas of chronic hemorrhage. Pituitary, pineal, and cerebellar tonsils unremarkable. No upper cervical lesions. Visualized calvarium, skull base, and upper cervical osseous structures unremarkable. Scalp and extracranial soft tissues, orbits, sinuses, and mastoids show no acute process.  Post infusion, no abnormal enhancement of the brain or meninges. Specifically no features suggestive of intracranial metastatic disease.  IMPRESSION: Stable exam. Chronic changes as described. No acute intracranial abnormality. No evidence for metastatic disease to the brain or surrounding structures.   Electronically Signed   By: JRolla FlattenM.D.   On: 02016-05-1715:04   Nm Pet Image Initial (pi) Skull Base To Thigh  404-17-2016  CLINICAL DATA:  Initial treatment strategy for left-sided lung mass with mediastinal and left hilar adenopathy.  EXAM: NUCLEAR MEDICINE PET SKULL BASE TO THIGH  TECHNIQUE: 10.4 mCi F-18 FDG was injected intravenously. Full-ring PET imaging was performed from the skull base to thigh after the radiotracer. CT data was obtained and used for attenuation correction and anatomic localization.  FASTING BLOOD GLUCOSE:  Value: 70 T0 mg/dl  COMPARISON:  Chest abdomen and pelvic CTs of 05/05/2014.  FINDINGS: NECK  No areas of abnormal hypermetabolism.  CHEST  Hypermetabolic prevascular nodal mass measures 8.3 x 5.6 cm and a  S.U.V. max of 16.7.  Soft tissue mass centered about the left lower lobe bronchi is favored to be pulmonary parenchymal in origin. Measures 4.4 cm and a S.U.V. max of 16.1 on image 43.  Bilateral hypermetabolic pulmonary nodules. Nodule along the left upper lobe anterior segmental bronchus measures 2.5 cm and a S.U.V. max of 15.5.  ABDOMEN/PELVIS  Bilateral hepatic metastasis. A relatively CT occult lesion measures a S.U.V. max of 8.0 in the right hepatic dome on image 82.  Lesion in the far lateral segment of the left lobe of the liver measures a S.U.V. max of 6.7 on image 95.  Low anal hypermetabolism is favored to be physiologic. This measures a S.U.V. max of 20.6.  SKELETON  Left sternal lesion measures a S.U.V. max of 9.7 on image 51.  CT IMAGES PERFORMED FOR ATTENUATION CORRECTION  No significant findings within the neck. Chest, abdomen, and pelvic findings deferred to recent diagnostic CTs. Small left pleural effusion is decreased. The right pleural effusion is resolved. Small pericardial effusion is similar to minimally decreased.  IMPRESSION: 1. Extensive thoracic adenopathy with bilateral pulmonary nodules. Favor widespread metastatic disease from primary bronchogenic carcinoma (likely in the central left lower lobe). 2. Small volume osseous and hepatic metastasis. 3. Likely physiologic low anal hypermetabolism. Consider physical exam correlation.   Electronically Signed   By: Abigail Miyamoto M.D.   On: 05/29/2014 15:04    ASSESSMENT: This is a very pleasant 53 years old African-American male recently diagnosed with stage IV (T2a, N3, M1b) non-small cell lung cancer, squamous cell carcinoma presented with a left infrahilar mass in addition to bulky mediastinal lymphadenopathy as well as bone and liver disease diagnosed in March 2016.   PLAN: I had a lengthy discussion with the patient and his family today about his current disease stage, prognosis and treatment options. I explained to the patient and  his family that he has incurable condition and also treatment will be off palliative nature. I will give the patient the option of treatment with systemic chemotherapy with carboplatin for AUC of 5 on day 1 and gemcitabine 1000 MG/M2 on days 1 and 8 every 3 weeks versus palliative care and hospice referral. The patient is interested in proceeding with systemic chemotherapy. I discussed with him the adverse effect of this treatment including but not limited to alopecia, myelosuppression, nausea and vomiting, peripheral neuropathy, liver or renal dysfunction. I expect the patient to start the first cycle of this treatment next week. He would have a chemotherapy education class before starting the first dose of his treatment. The patient would also be seen later today by Dr. Sondra Come from radiation oncology for evaluation and consideration of palliative radiotherapy to the painful bony lesion. I will call his pharmacy with prescription for Compazine 10 mg by mouth every 6 hours as needed for nausea. The patient will come back for follow-up visit in 2 weeks for reevaluation and management of any adverse effect of his treatment. For smoke cessation, I strongly encouraged the patient to quit smoking and offered him a smoking cessation program. He was advised to call immediately if he has any concerning symptoms in the interval. The patient was seen during the multidisciplinary thoracic oncology clinic today by medical oncology, radiation oncology, thoracic navigator, social worker as well as physical therapist.  The patient voices understanding of current disease status and treatment options and is in agreement with the current care plan.  All questions were answered. The patient knows to call the clinic with any problems, questions or concerns. We can certainly see the patient much sooner if necessary.  Thank you so much for allowing me to participate in the care of Keith Holloway. I will continue to  follow up the patient with you and assist in his care.  I spent 55 minutes counseling the patient face to face. The total time spent in the appointment was 80 minutes.  Disclaimer: This note was dictated with voice recognition software. Similar sounding words can inadvertently be transcribed and may not be corrected upon review.   Takima Encina K. June 13, 2014, 1:51 PM

## 2014-06-13 NOTE — Progress Notes (Signed)
[facility] Clinical Social Work  Clinical Social Work met with patient/family at [employer] appointment to offer support and assess for psychosocial needs.  Patient was accompanied by his spouse, sister,and daughter.  Mr. Mullen scored a 9 on his distress screen.  The patient is legally blind- he shared he is not working and has been approved for disability recently.  His main concern was how he will respond to chemotherapy treatment and what side effects he may experience.  His spouse's main concern is patient's lack of appetite and difficulty paying for Ensure Plus.  CSW explained importance of meeting with dietitian and will make referral to dietitian.   Patient's spouse plans to contact CSW to complete applications for transportation assistance.  Clinical Social Work briefly discussed Clinical Social Work role and [employer] support programs/services.  Clinical Social Work encouraged patient to call with any additional questions or concerns.   Polo Riley, MSW, LCSW, OSW-C Clinical Social Worker [facility] [phone]

## 2014-06-14 ENCOUNTER — Encounter: Payer: Self-pay | Admitting: *Deleted

## 2014-06-14 NOTE — [facility] Oncology Navigator Note (Unsigned)
Faxed gas card application to lung cancer initiative.

## 2014-06-17 ENCOUNTER — Encounter: Payer: Self-pay | Admitting: *Deleted

## 2014-06-17 ENCOUNTER — Other Ambulatory Visit: Payer: Self-pay | Admitting: Internal Medicine

## 2014-06-17 ENCOUNTER — Other Ambulatory Visit: Payer: Medicare Other

## 2014-06-17 DIAGNOSIS — C3492 Malignant neoplasm of unspecified part of left bronchus or lung: Secondary | ICD-10-CM

## 2014-06-17 MED ORDER — HYDROCODONE-ACETAMINOPHEN 5-325 MG PO TABS: 2.0000 | ORAL_TABLET | Freq: Four times a day (QID) | ORAL | Status: DC | PRN

## 2014-06-17 NOTE — [facility] Oncology Navigator Note (Unsigned)
Faxed application for gas card to T.A.G program with confirmation faxed received.

## 2014-06-19 ENCOUNTER — Other Ambulatory Visit (HOSPITAL_BASED_OUTPATIENT_CLINIC_OR_DEPARTMENT_OTHER): Payer: Medicare Other

## 2014-06-19 DIAGNOSIS — R918 Other nonspecific abnormal finding of lung field: Secondary | ICD-10-CM

## 2014-06-19 DIAGNOSIS — C801 Malignant (primary) neoplasm, unspecified: Secondary | ICD-10-CM | POA: Diagnosis present

## 2014-06-19 DIAGNOSIS — C7802 Secondary malignant neoplasm of left lung: Secondary | ICD-10-CM | POA: Diagnosis not present

## 2014-06-19 LAB — CBC WITH DIFFERENTIAL/PLATELET
BASO%: 0.3 % (ref 0.0–2.0)
BASOS ABS: 0 10*3/uL (ref 0.0–0.1)
EOS ABS: 0.2 10*3/uL (ref 0.0–0.5)
EOS%: 1.8 % (ref 0.0–7.0)
HCT: 34.5 % — ABNORMAL LOW (ref 38.4–49.9)
HGB: 11.7 g/dL — ABNORMAL LOW (ref 13.0–17.1)
LYMPH%: 12.9 % — ABNORMAL LOW (ref 14.0–49.0)
MCH: 22.1 pg — ABNORMAL LOW (ref 27.2–33.4)
MCHC: 33.9 g/dL (ref 32.0–36.0)
MCV: 65.2 fL — AB (ref 79.3–98.0)
MONO#: 0.9 10*3/uL (ref 0.1–0.9)
MONO%: 9.3 % (ref 0.0–14.0)
NEUT%: 75.7 % — ABNORMAL HIGH (ref 39.0–75.0)
NEUTROS ABS: 7.5 10*3/uL — AB (ref 1.5–6.5)
Platelets: 263 10*3/uL (ref 140–400)
RBC: 5.29 10*6/uL (ref 4.20–5.82)
RDW: 14.5 % (ref 11.0–14.6)
WBC: 9.9 10*3/uL (ref 4.0–10.3)
lymph#: 1.3 10*3/uL (ref 0.9–3.3)
nRBC: 0 % (ref 0–0)

## 2014-06-19 LAB — COMPREHENSIVE METABOLIC PANEL (CC13)
ALT: 21 U/L (ref 0–55)
ANION GAP: 11 meq/L (ref 3–11)
AST: 17 U/L (ref 5–34)
Albumin: 3.8 g/dL (ref 3.5–5.0)
Alkaline Phosphatase: 121 U/L (ref 40–150)
BUN: 28.4 mg/dL — ABNORMAL HIGH (ref 7.0–26.0)
CALCIUM: 9.8 mg/dL (ref 8.4–10.4)
CHLORIDE: 103 meq/L (ref 98–109)
CO2: 25 mEq/L (ref 22–29)
Creatinine: 1.9 mg/dL — ABNORMAL HIGH (ref 0.7–1.3)
EGFR: 45 mL/min/{1.73_m2} — ABNORMAL LOW (ref >90)
Glucose: 165 mg/dl — ABNORMAL HIGH (ref 70–140)
Potassium: 4.8 mEq/L (ref 3.5–5.1)
SODIUM: 139 meq/L (ref 136–145)
TOTAL PROTEIN: 8.3 g/dL (ref 6.4–8.3)
Total Bilirubin: 0.26 mg/dL (ref 0.20–1.20)

## 2014-06-20 ENCOUNTER — Other Ambulatory Visit: Payer: Self-pay | Admitting: Internal Medicine

## 2014-06-21 ENCOUNTER — Ambulatory Visit (HOSPITAL_BASED_OUTPATIENT_CLINIC_OR_DEPARTMENT_OTHER): Payer: Medicare Other

## 2014-06-21 VITALS — BP 130/77 | HR 89 | Temp 97.8°F | Resp 18

## 2014-06-21 DIAGNOSIS — Z5111 Encounter for antineoplastic chemotherapy: Secondary | ICD-10-CM

## 2014-06-21 DIAGNOSIS — C787 Secondary malignant neoplasm of liver and intrahepatic bile duct: Secondary | ICD-10-CM

## 2014-06-21 DIAGNOSIS — C7951 Secondary malignant neoplasm of bone: Secondary | ICD-10-CM

## 2014-06-21 DIAGNOSIS — C3432 Malignant neoplasm of lower lobe, left bronchus or lung: Secondary | ICD-10-CM | POA: Diagnosis not present

## 2014-06-21 DIAGNOSIS — C3492 Malignant neoplasm of unspecified part of left bronchus or lung: Secondary | ICD-10-CM

## 2014-06-21 MED ORDER — SODIUM CHLORIDE 0.9 % IV SOLN
367.0000 mg | Freq: Once | INTRAVENOUS | Status: AC
  Administered 2014-06-21: 370 mg via INTRAVENOUS
  Filled 2014-06-21: qty 37

## 2014-06-21 MED ORDER — SODIUM CHLORIDE 0.9 % IV SOLN
Freq: Once | INTRAVENOUS | Status: AC
  Administered 2014-06-21: 08:00:00 via INTRAVENOUS
  Filled 2014-06-21: qty 8

## 2014-06-21 MED ORDER — SODIUM CHLORIDE 0.9 % IV SOLN
2000.0000 mg | Freq: Once | INTRAVENOUS | Status: AC
  Administered 2014-06-21: 2000 mg via INTRAVENOUS
  Filled 2014-06-21: qty 52.6

## 2014-06-21 MED ORDER — SODIUM CHLORIDE 0.9 % IV SOLN
Freq: Once | INTRAVENOUS | Status: AC
  Administered 2014-06-21: 08:00:00 via INTRAVENOUS

## 2014-06-21 NOTE — Patient Instructions (Signed)
[facility] Discharge Instructions for Patients Receiving Chemotherapy  Today you received the following chemotherapy agents: Gemzar and Carboplatin.   To help prevent nausea and vomiting after your treatment, we encourage you to take your nausea medication: Compazine every 6 hours as needed.    If you develop nausea and vomiting that is not controlled by your nausea medication, call the clinic.   BELOW ARE SYMPTOMS THAT SHOULD BE REPORTED IMMEDIATELY:  *FEVER GREATER THAN 100.5 F  *CHILLS WITH OR WITHOUT FEVER  NAUSEA AND VOMITING THAT IS NOT CONTROLLED WITH YOUR NAUSEA MEDICATION  *UNUSUAL SHORTNESS OF BREATH  *UNUSUAL BRUISING OR BLEEDING  TENDERNESS IN MOUTH AND THROAT WITH OR WITHOUT PRESENCE OF ULCERS  *URINARY PROBLEMS  *BOWEL PROBLEMS  UNUSUAL RASH Items with * indicate a potential emergency and should be followed up as soon as possible.  Feel free to call the clinic should you have any questions or concerns. The clinic phone number is [phone].  Please show the [place] at check-in to the Emergency Department and triage nurse.  Gemcitabine injection What is this medicine? GEMCITABINE (jem SIT a been) is a chemotherapy drug. This medicine is used to treat many types of cancer like breast cancer, lung cancer, pancreatic cancer, and ovarian cancer. This medicine may be used for other purposes; ask your health care provider or pharmacist if you have questions. COMMON BRAND NAME(S): Gemzar What should I tell my health care provider before I take this medicine? They need to know if you have any of these conditions: -blood disorders -infection -kidney disease -liver disease -recent or ongoing radiation therapy -an unusual or allergic reaction to gemcitabine, other chemotherapy, other medicines, foods, dyes, or preservatives -pregnant or trying to get pregnant -breast-feeding How should I use this medicine? This drug is given as an  infusion into a vein. It is administered in a hospital or clinic by a specially trained health care professional. Talk to your pediatrician regarding the use of this medicine in children. Special care may be needed. Overdosage: If you think you have taken too much of this medicine contact a poison control center or emergency room at once. NOTE: This medicine is only for you. Do not share this medicine with others. What if I miss a dose? It is important not to miss your dose. Call your doctor or health care professional if you are unable to keep an appointment. What may interact with this medicine? -medicines to increase blood counts like filgrastim, pegfilgrastim, sargramostim -some other chemotherapy drugs like cisplatin -vaccines Talk to your doctor or health care professional before taking any of these medicines: -acetaminophen -aspirin -ibuprofen -ketoprofen -naproxen This list may not describe all possible interactions. Give your health care provider a list of all the medicines, herbs, non-prescription drugs, or dietary supplements you use. Also tell them if you smoke, drink alcohol, or use illegal drugs. Some items may interact with your medicine. What should I watch for while using this medicine? Visit your doctor for checks on your progress. This drug may make you feel generally unwell. This is not uncommon, as chemotherapy can affect healthy cells as well as cancer cells. Report any side effects. Continue your course of treatment even though you feel ill unless your doctor tells you to stop. In some cases, you may be given additional medicines to help with side effects. Follow all directions for their use. Call your doctor or health care professional for advice if you get a fever, chills or sore  throat, or other symptoms of a cold or flu. Do not treat yourself. This drug decreases your body's ability to fight infections. Try to avoid being around people who are sick. This medicine may  increase your risk to bruise or bleed. Call your doctor or health care professional if you notice any unusual bleeding. Be careful brushing and flossing your teeth or using a toothpick because you may get an infection or bleed more easily. If you have any dental work done, tell your dentist you are receiving this medicine. Avoid taking products that contain aspirin, acetaminophen, ibuprofen, naproxen, or ketoprofen unless instructed by your doctor. These medicines may hide a fever. Women should inform their doctor if they wish to become pregnant or think they might be pregnant. There is a potential for serious side effects to an unborn child. Talk to your health care professional or pharmacist for more information. Do not breast-feed an infant while taking this medicine. What side effects may I notice from receiving this medicine? Side effects that you should report to your doctor or health care professional as soon as possible: -allergic reactions like skin rash, itching or hives, swelling of the face, lips, or tongue -low blood counts - this medicine may decrease the number of white blood cells, red blood cells and platelets. You may be at increased risk for infections and bleeding. -signs of infection - fever or chills, cough, sore throat, pain or difficulty passing urine -signs of decreased platelets or bleeding - bruising, pinpoint red spots on the skin, black, tarry stools, blood in the urine -signs of decreased red blood cells - unusually weak or tired, fainting spells, lightheadedness -breathing problems -chest pain -mouth sores -nausea and vomiting -pain, swelling, redness at site where injected -pain, tingling, numbness in the hands or feet -stomach pain -swelling of ankles, feet, hands -unusual bleeding Side effects that usually do not require medical attention (report to your doctor or health care professional if they continue or are bothersome): -constipation -diarrhea -hair  loss -loss of appetite -stomach upset This list may not describe all possible side effects. Call your doctor for medical advice about side effects. You may report side effects to FDA at 1-800-FDA-1088. Where should I keep my medicine? This drug is given in a hospital or clinic and will not be stored at home. NOTE: This sheet is a summary. It may not cover all possible information. If you have questions about this medicine, talk to your doctor, pharmacist, or health care provider.  2015, Elsevier/Gold Standard. (2007-06-13 18:45:54) Carboplatin injection What is this medicine? CARBOPLATIN (KAR boe pla tin) is a chemotherapy drug. It targets fast dividing cells, like cancer cells, and causes these cells to die. This medicine is used to treat ovarian cancer and many other cancers. This medicine may be used for other purposes; ask your health care provider or pharmacist if you have questions. COMMON BRAND NAME(S): Paraplatin What should I tell my health care provider before I take this medicine? They need to know if you have any of these conditions: -blood disorders -hearing problems -kidney disease -recent or ongoing radiation therapy -an unusual or allergic reaction to carboplatin, cisplatin, other chemotherapy, other medicines, foods, dyes, or preservatives -pregnant or trying to get pregnant -breast-feeding How should I use this medicine? This drug is usually given as an infusion into a vein. It is administered in a hospital or clinic by a specially trained health care professional. Talk to your pediatrician regarding the use of this medicine in children. Special  care may be needed. Overdosage: If you think you have taken too much of this medicine contact a poison control center or emergency room at once. NOTE: This medicine is only for you. Do not share this medicine with others. What if I miss a dose? It is important not to miss a dose. Call your doctor or health care professional if  you are unable to keep an appointment. What may interact with this medicine? -medicines for seizures -medicines to increase blood counts like filgrastim, pegfilgrastim, sargramostim -some antibiotics like amikacin, gentamicin, neomycin, streptomycin, tobramycin -vaccines Talk to your doctor or health care professional before taking any of these medicines: -acetaminophen -aspirin -ibuprofen -ketoprofen -naproxen This list may not describe all possible interactions. Give your health care provider a list of all the medicines, herbs, non-prescription drugs, or dietary supplements you use. Also tell them if you smoke, drink alcohol, or use illegal drugs. Some items may interact with your medicine. What should I watch for while using this medicine? Your condition will be monitored carefully while you are receiving this medicine. You will need important blood work done while you are taking this medicine. This drug may make you feel generally unwell. This is not uncommon, as chemotherapy can affect healthy cells as well as cancer cells. Report any side effects. Continue your course of treatment even though you feel ill unless your doctor tells you to stop. In some cases, you may be given additional medicines to help with side effects. Follow all directions for their use. Call your doctor or health care professional for advice if you get a fever, chills or sore throat, or other symptoms of a cold or flu. Do not treat yourself. This drug decreases your body's ability to fight infections. Try to avoid being around people who are sick. This medicine may increase your risk to bruise or bleed. Call your doctor or health care professional if you notice any unusual bleeding. Be careful brushing and flossing your teeth or using a toothpick because you may get an infection or bleed more easily. If you have any dental work done, tell your dentist you are receiving this medicine. Avoid taking products that contain  aspirin, acetaminophen, ibuprofen, naproxen, or ketoprofen unless instructed by your doctor. These medicines may hide a fever. Do not become pregnant while taking this medicine. Women should inform their doctor if they wish to become pregnant or think they might be pregnant. There is a potential for serious side effects to an unborn child. Talk to your health care professional or pharmacist for more information. Do not breast-feed an infant while taking this medicine. What side effects may I notice from receiving this medicine? Side effects that you should report to your doctor or health care professional as soon as possible: -allergic reactions like skin rash, itching or hives, swelling of the face, lips, or tongue -signs of infection - fever or chills, cough, sore throat, pain or difficulty passing urine -signs of decreased platelets or bleeding - bruising, pinpoint red spots on the skin, black, tarry stools, nosebleeds -signs of decreased red blood cells - unusually weak or tired, fainting spells, lightheadedness -breathing problems -changes in hearing -changes in vision -chest pain -high blood pressure -low blood counts - This drug may decrease the number of white blood cells, red blood cells and platelets. You may be at increased risk for infections and bleeding. -nausea and vomiting -pain, swelling, redness or irritation at the injection site -pain, tingling, numbness in the hands or feet -problems with  balance, talking, walking -trouble passing urine or change in the amount of urine Side effects that usually do not require medical attention (report to your doctor or health care professional if they continue or are bothersome): -hair loss -loss of appetite -metallic taste in the mouth or changes in taste This list may not describe all possible side effects. Call your doctor for medical advice about side effects. You may report side effects to FDA at 1-800-FDA-1088. Where should I keep  my medicine? This drug is given in a hospital or clinic and will not be stored at home. NOTE: This sheet is a summary. It may not cover all possible information. If you have questions about this medicine, talk to your doctor, pharmacist, or health care provider.  2015, Elsevier/Gold Standard. (2007-05-09 14:38:05)

## 2014-06-21 NOTE — Progress Notes (Signed)
Okay to proceed with treatment today with creatinine of 1.9 per Dr. Marin Olp.

## 2014-06-24 ENCOUNTER — Telehealth: Payer: Self-pay | Admitting: *Deleted

## 2014-06-24 NOTE — Telephone Encounter (Signed)
-----   Message from Oliver Hum, RN sent at 06/21/2014  9:39 AM EDT ----- Regarding: MM: Chemo Follow up Call Pt of Dr. Julien Nordmann. First time Gemzar and Carboplatin.

## 2014-06-24 NOTE — Telephone Encounter (Signed)
Called pt to check status, unable to reach lmovm to call with any concerns.

## 2014-06-25 ENCOUNTER — Other Ambulatory Visit: Payer: Self-pay | Admitting: *Deleted

## 2014-06-25 DIAGNOSIS — C3492 Malignant neoplasm of unspecified part of left bronchus or lung: Secondary | ICD-10-CM

## 2014-06-26 ENCOUNTER — Encounter: Payer: Self-pay | Admitting: Nurse Practitioner

## 2014-06-26 ENCOUNTER — Other Ambulatory Visit (HOSPITAL_BASED_OUTPATIENT_CLINIC_OR_DEPARTMENT_OTHER): Payer: Medicare Other

## 2014-06-26 ENCOUNTER — Ambulatory Visit: Payer: Medicare Other

## 2014-06-26 ENCOUNTER — Ambulatory Visit (HOSPITAL_BASED_OUTPATIENT_CLINIC_OR_DEPARTMENT_OTHER): Payer: Medicare Other | Admitting: Nurse Practitioner

## 2014-06-26 ENCOUNTER — Telehealth: Payer: Self-pay | Admitting: Internal Medicine

## 2014-06-26 VITALS — BP 124/75 | HR 115 | Temp 98.3°F | Resp 18 | Ht 71.0 in | Wt 181.7 lb

## 2014-06-26 DIAGNOSIS — C7951 Secondary malignant neoplasm of bone: Secondary | ICD-10-CM

## 2014-06-26 DIAGNOSIS — C3432 Malignant neoplasm of lower lobe, left bronchus or lung: Secondary | ICD-10-CM

## 2014-06-26 DIAGNOSIS — C3492 Malignant neoplasm of unspecified part of left bronchus or lung: Secondary | ICD-10-CM

## 2014-06-26 DIAGNOSIS — R439 Unspecified disturbances of smell and taste: Secondary | ICD-10-CM | POA: Diagnosis not present

## 2014-06-26 DIAGNOSIS — C787 Secondary malignant neoplasm of liver and intrahepatic bile duct: Secondary | ICD-10-CM | POA: Diagnosis not present

## 2014-06-26 DIAGNOSIS — R634 Abnormal weight loss: Secondary | ICD-10-CM | POA: Diagnosis not present

## 2014-06-26 LAB — COMPREHENSIVE METABOLIC PANEL (CC13)
ALBUMIN: 3.8 g/dL (ref 3.5–5.0)
ALT: 92 U/L — AB (ref 0–55)
AST: 55 U/L — ABNORMAL HIGH (ref 5–34)
Alkaline Phosphatase: 129 U/L (ref 40–150)
Anion Gap: 12 mEq/L — ABNORMAL HIGH (ref 3–11)
BUN: 37.1 mg/dL — ABNORMAL HIGH (ref 7.0–26.0)
CALCIUM: 9.9 mg/dL (ref 8.4–10.4)
CHLORIDE: 101 meq/L (ref 98–109)
CO2: 19 meq/L — AB (ref 22–29)
Creatinine: 2 mg/dL — ABNORMAL HIGH (ref 0.7–1.3)
EGFR: 44 mL/min/{1.73_m2} — AB (ref >90)
GLUCOSE: 347 mg/dL — AB (ref 70–140)
Potassium: 4.8 mEq/L (ref 3.5–5.1)
SODIUM: 132 meq/L — AB (ref 136–145)
Total Bilirubin: 0.41 mg/dL (ref 0.20–1.20)
Total Protein: 8.5 g/dL — ABNORMAL HIGH (ref 6.4–8.3)

## 2014-06-26 LAB — CBC WITH DIFFERENTIAL/PLATELET
BASO%: 1.2 % (ref 0.0–2.0)
Basophils Absolute: 0.1 10*3/uL (ref 0.0–0.1)
EOS%: 2.2 % (ref 0.0–7.0)
Eosinophils Absolute: 0.1 10*3/uL (ref 0.0–0.5)
HCT: 34.4 % — ABNORMAL LOW (ref 38.4–49.9)
HGB: 11.3 g/dL — ABNORMAL LOW (ref 13.0–17.1)
LYMPH%: 13 % — AB (ref 14.0–49.0)
MCH: 21.5 pg — ABNORMAL LOW (ref 27.2–33.4)
MCHC: 32.8 g/dL (ref 32.0–36.0)
MCV: 65.7 fL — AB (ref 79.3–98.0)
MONO#: 0.1 10*3/uL (ref 0.1–0.9)
MONO%: 1.5 % (ref 0.0–14.0)
NEUT%: 82.1 % — ABNORMAL HIGH (ref 39.0–75.0)
NEUTROS ABS: 4.9 10*3/uL (ref 1.5–6.5)
PLATELETS: 267 10*3/uL (ref 140–400)
RBC: 5.24 10*6/uL (ref 4.20–5.82)
RDW: 14.4 % (ref 11.0–14.6)
WBC: 6 10*3/uL (ref 4.0–10.3)
lymph#: 0.8 10*3/uL — ABNORMAL LOW (ref 0.9–3.3)

## 2014-06-26 MED ORDER — HYDROCODONE-ACETAMINOPHEN 5-325 MG PO TABS: 2.0000 | ORAL_TABLET | Freq: Four times a day (QID) | ORAL | Status: DC | PRN

## 2014-06-26 NOTE — Progress Notes (Addendum)
  [facility] OFFICE PROGRESS NOTE   Diagnosis:  Non-small cell lung cancer  INTERVAL HISTORY:   [name] returns as scheduled. He completed cycle 1 day 1 carboplatin/gemcitabine 06/21/2014. He had mild nausea with a few episodes of vomiting. Home antiemetics were effective. No mouth sores. He has had a few loose stools. He notes an alteration in taste. He denies fever. No rash. He has stable dyspnea on exertion. He continues to have pain at the chest, upper back and shoulders. He is taking hydrocodone.  Objective:  Vital signs in last 24 hours:  Blood pressure 124/75, pulse 115, temperature 98.3 F (36.8 C), temperature source Oral, resp. rate 18, height '5\' 11"'$  (1.803 m), weight 181 lb 11.2 oz (82.419 kg), SpO2 100 %. repeat heart rate 100    HEENT: No thrush or ulcers. Resp: Lungs clear bilaterally. Distant breath sounds. Cardio: Regular rate and rhythm. GI: Abdomen soft and nontender. No hepatomegaly. Vascular: No leg edema.     Lab Results:  Lab Results  Component Value Date   WBC 6.0 06/26/2014   HGB 11.3* 06/26/2014   HCT 34.4* 06/26/2014   MCV 65.7* 06/26/2014   PLT 267 06/26/2014   NEUTROABS 4.9 06/26/2014    Imaging:  No results found.  Medications: I have reviewed the patient's current medications.  Assessment/Plan: 1. Stage IV (T2a, N3, M1b) non-small cell lung cancer, squamous cell carcinoma; presented with a left infrahilar mass in addition to bulky mediastinal lymphadenopathy as well as bone and liver disease diagnosed in March 2016. Initiation of carboplatin AUC 5 on day 1 and gemcitabine 1000 g/m on days 1 and 8 every 3 weeks beginning 06/21/2014.   Disposition: [name] appears stable. He will return for cycle 1 day 8 gemcitabine on 06/28/2014. He will return for a follow-up visit prior to cycle 2 on 07/10/2014.   A referral was made to the cancer center dietitian due to an alteration in taste and weight loss.  A new  hydrocodone prescription was provided at today's visit.  He will contact the office prior to his next visit with any problems.  Patient seen with Dr. Julien Nordmann.      [name] ANP/GNP-BC   06/26/2014  12:27 PM  ADDENDUM: Hematology/Oncology Attending: I had a face to face encounter with the patient. I recommended his care plan. This is a very pleasant 54 years old African-American male recently diagnosed with a stage IV non-small cell lung cancer, squamous cell carcinoma and currently undergoing systemic chemotherapy with carboplatin and gemcitabine status post day 1 of the first cycle. He tolerated the first cycle of his treatment fairly well with no significant complaints except for mild nausea resolved with Compazine. I recommended for the patient to proceed with day 8 of the first cycle on 06/28/2014 as a scheduled. He would come back for follow-up visit in 2 weeks for reevaluation before starting cycle #2. For the weight loss, we will refer him to the dietitian at the [facility]. The patient was given a refill of his pain medication today. He was advised to call immediately if he has any concerning symptoms in the interval.  Disclaimer: This note was dictated with voice recognition software. Similar sounding words can inadvertently be transcribed and may be missed upon review. Eilleen Kempf., MD 06/26/2014

## 2014-06-26 NOTE — Telephone Encounter (Signed)
Gave and rpinted appt sched adna vs fo rpt for May and June

## 2014-06-28 ENCOUNTER — Other Ambulatory Visit (HOSPITAL_BASED_OUTPATIENT_CLINIC_OR_DEPARTMENT_OTHER): Payer: Medicare Other

## 2014-06-28 ENCOUNTER — Ambulatory Visit: Payer: Medicare Other | Admitting: Nutrition

## 2014-06-28 ENCOUNTER — Ambulatory Visit (HOSPITAL_BASED_OUTPATIENT_CLINIC_OR_DEPARTMENT_OTHER): Payer: Medicare Other

## 2014-06-28 VITALS — BP 141/84 | HR 114 | Temp 97.6°F | Resp 18

## 2014-06-28 DIAGNOSIS — Z5111 Encounter for antineoplastic chemotherapy: Secondary | ICD-10-CM

## 2014-06-28 DIAGNOSIS — C3492 Malignant neoplasm of unspecified part of left bronchus or lung: Secondary | ICD-10-CM

## 2014-06-28 DIAGNOSIS — C3432 Malignant neoplasm of lower lobe, left bronchus or lung: Secondary | ICD-10-CM

## 2014-06-28 DIAGNOSIS — C787 Secondary malignant neoplasm of liver and intrahepatic bile duct: Secondary | ICD-10-CM

## 2014-06-28 DIAGNOSIS — C7951 Secondary malignant neoplasm of bone: Secondary | ICD-10-CM | POA: Diagnosis not present

## 2014-06-28 LAB — CBC WITH DIFFERENTIAL/PLATELET
BASO%: 1.1 % (ref 0.0–2.0)
Basophils Absolute: 0 10*3/uL (ref 0.0–0.1)
EOS ABS: 0 10*3/uL (ref 0.0–0.5)
EOS%: 0.5 % (ref 0.0–7.0)
HCT: 32.2 % — ABNORMAL LOW (ref 38.4–49.9)
HEMOGLOBIN: 10.7 g/dL — AB (ref 13.0–17.1)
LYMPH%: 11.8 % — AB (ref 14.0–49.0)
MCH: 21.6 pg — AB (ref 27.2–33.4)
MCHC: 33.3 g/dL (ref 32.0–36.0)
MCV: 64.8 fL — AB (ref 79.3–98.0)
MONO#: 0.3 10*3/uL (ref 0.1–0.9)
MONO%: 5.8 % (ref 0.0–14.0)
NEUT%: 80.8 % — AB (ref 39.0–75.0)
NEUTROS ABS: 3.6 10*3/uL (ref 1.5–6.5)
PLATELETS: 214 10*3/uL (ref 140–400)
RBC: 4.98 10*6/uL (ref 4.20–5.82)
RDW: 14.2 % (ref 11.0–14.6)
WBC: 4.5 10*3/uL (ref 4.0–10.3)
lymph#: 0.5 10*3/uL — ABNORMAL LOW (ref 0.9–3.3)

## 2014-06-28 MED ORDER — SODIUM CHLORIDE 0.9 % IV SOLN
Freq: Once | INTRAVENOUS | Status: AC
  Administered 2014-06-28: 10:00:00 via INTRAVENOUS

## 2014-06-28 MED ORDER — SODIUM CHLORIDE 0.9 % IV SOLN
2000.0000 mg | Freq: Once | INTRAVENOUS | Status: AC
  Administered 2014-06-28: 2000 mg via INTRAVENOUS
  Filled 2014-06-28: qty 52.6

## 2014-06-28 MED ORDER — PROCHLORPERAZINE MALEATE 10 MG PO TABS: 10.0000 mg | ORAL_TABLET | Freq: Once | ORAL | Status: DC

## 2014-06-28 NOTE — Progress Notes (Signed)
Patient reminded to as instructed by Dory Peru, Nutritionist: choose 'cold' foods to help prevent nausea. Drink at least 3 cans of Ensure daily Drink plenty of liquids Take nausea meds as needed for nausea. Verified understanding.

## 2014-06-28 NOTE — Progress Notes (Signed)
54 year old male diagnosed with non-small cell lung cancer stage IV.  He is a patient of Dr. Earlie Server.  Past medical history includes diabetes, hypertension, hypercholesterolemia, migraine, depression, alcohol, polysubstance abuse, and CHF.  Medications include NPH, Glucophage, Compazine, and Zocor.  Labs include sodium 132, glucose 347, BUN 37.1, creatinine 2.0.  Height: 5 feet 11 inches. Weight: 181.7 pounds. Usual body weight: 206 pounds March 2016. BMI: 25.35.  Patient reports he has experienced nausea and vomiting. It is controlled currently with nausea medication. Patient has poor appetite and very little oral intake. He complains of taste alterations. He reports loose watery stools twice a day.  Patient meets criteria for severe malnutrition in the context of chronic illness secondary to 12% weight loss in 2 months and less than 75% energy intake for greater than one month.  Nutrition diagnosis: Inadequate oral intake related to poor appetite and taste alterations as evidenced by 24 pound weight loss.  Intervention:  Educated patient on strategies for eating if he has nausea or vomiting. Encouraged small frequent meals with high-calorie, high-protein foods as tolerated. Educated patient on strategies for improving taste of food Educated patient on strategies for minimizing diarrhea. Encouraged patient to consume oral nutrition supplements such as Ensure Plus 3 times a day between meals.  Provided coupons for supplements. Fact sheets were provided and questions were answered. Teach back method was used and contact information was provided.  Monitoring, evaluation, goals: Patient will tolerate increased calories and protein to minimize further weight loss.  Next visit: Wednesday, June 1 during infusion.  **Disclaimer: This note was dictated with voice recognition software. Similar sounding words can inadvertently be transcribed and this note may contain transcription errors  which may not have been corrected upon publication of note.**

## 2014-06-28 NOTE — Patient Instructions (Signed)
[facility] Discharge Instructions for Patients Receiving Chemotherapy  Today you received the following chemotherapy agents Gemzar.  To help prevent nausea and vomiting after your treatment, we encourage you to take your nausea medication.   If you develop nausea and vomiting that is not controlled by your nausea medication, call the clinic.   BELOW ARE SYMPTOMS THAT SHOULD BE REPORTED IMMEDIATELY:  *FEVER GREATER THAN 100.5 F  *CHILLS WITH OR WITHOUT FEVER  NAUSEA AND VOMITING THAT IS NOT CONTROLLED WITH YOUR NAUSEA MEDICATION  *UNUSUAL SHORTNESS OF BREATH  *UNUSUAL BRUISING OR BLEEDING  TENDERNESS IN MOUTH AND THROAT WITH OR WITHOUT PRESENCE OF ULCERS  *URINARY PROBLEMS  *BOWEL PROBLEMS  UNUSUAL RASH Items with * indicate a potential emergency and should be followed up as soon as possible.  Feel free to call the clinic you have any questions or concerns. The clinic phone number is [phone].  Please show the CHEMO ALERT CARD at check-in to the Emergency Department and triage nurse.   

## 2014-07-02 ENCOUNTER — Other Ambulatory Visit: Payer: Self-pay | Admitting: Medical Oncology

## 2014-07-02 DIAGNOSIS — C3490 Malignant neoplasm of unspecified part of unspecified bronchus or lung: Secondary | ICD-10-CM

## 2014-07-03 ENCOUNTER — Other Ambulatory Visit: Payer: Medicare Other

## 2014-07-04 ENCOUNTER — Telehealth: Payer: Self-pay | Admitting: Internal Medicine

## 2014-07-04 ENCOUNTER — Other Ambulatory Visit (HOSPITAL_BASED_OUTPATIENT_CLINIC_OR_DEPARTMENT_OTHER): Payer: Medicare Other

## 2014-07-04 DIAGNOSIS — C3490 Malignant neoplasm of unspecified part of unspecified bronchus or lung: Secondary | ICD-10-CM

## 2014-07-04 DIAGNOSIS — C3432 Malignant neoplasm of lower lobe, left bronchus or lung: Secondary | ICD-10-CM

## 2014-07-04 LAB — COMPREHENSIVE METABOLIC PANEL (CC13)
ALK PHOS: 152 U/L — AB (ref 40–150)
ALT: 150 U/L — ABNORMAL HIGH (ref 0–55)
AST: 62 U/L — AB (ref 5–34)
Albumin: 3.8 g/dL (ref 3.5–5.0)
Anion Gap: 11 mEq/L (ref 3–11)
BILIRUBIN TOTAL: 0.71 mg/dL (ref 0.20–1.20)
BUN: 21.6 mg/dL (ref 7.0–26.0)
CO2: 24 mEq/L (ref 22–29)
CREATININE: 1.5 mg/dL — AB (ref 0.7–1.3)
Calcium: 10 mg/dL (ref 8.4–10.4)
Chloride: 98 mEq/L (ref 98–109)
EGFR: 58 mL/min/{1.73_m2} — AB (ref >90)
Glucose: 296 mg/dl — ABNORMAL HIGH (ref 70–140)
Potassium: 5.4 mEq/L — ABNORMAL HIGH (ref 3.5–5.1)
Sodium: 133 mEq/L — ABNORMAL LOW (ref 136–145)
Total Protein: 8.5 g/dL — ABNORMAL HIGH (ref 6.4–8.3)

## 2014-07-04 LAB — CBC WITH DIFFERENTIAL/PLATELET
BASO%: 0.8 % (ref 0.0–2.0)
Basophils Absolute: 0 10*3/uL (ref 0.0–0.1)
EOS ABS: 0 10*3/uL (ref 0.0–0.5)
EOS%: 0.4 % (ref 0.0–7.0)
HCT: 32.3 % — ABNORMAL LOW (ref 38.4–49.9)
HEMOGLOBIN: 10.3 g/dL — AB (ref 13.0–17.1)
LYMPH%: 36.1 % (ref 14.0–49.0)
MCH: 20.8 pg — ABNORMAL LOW (ref 27.2–33.4)
MCHC: 31.8 g/dL — ABNORMAL LOW (ref 32.0–36.0)
MCV: 65.4 fL — ABNORMAL LOW (ref 79.3–98.0)
MONO#: 0.1 10*3/uL (ref 0.1–0.9)
MONO%: 7.6 % (ref 0.0–14.0)
NEUT%: 55.1 % (ref 39.0–75.0)
NEUTROS ABS: 0.8 10*3/uL — AB (ref 1.5–6.5)
Platelets: 129 10*3/uL — ABNORMAL LOW (ref 140–400)
RBC: 4.94 10*6/uL (ref 4.20–5.82)
RDW: 13.7 % (ref 11.0–14.6)
WBC: 1.4 10*3/uL — ABNORMAL LOW (ref 4.0–10.3)
lymph#: 0.5 10*3/uL — ABNORMAL LOW (ref 0.9–3.3)

## 2014-07-04 NOTE — Telephone Encounter (Signed)
pt called to r/s missed lab...done...pt ok and aware

## 2014-07-09 ENCOUNTER — Other Ambulatory Visit: Payer: Self-pay | Admitting: Physician Assistant

## 2014-07-09 DIAGNOSIS — C3490 Malignant neoplasm of unspecified part of unspecified bronchus or lung: Secondary | ICD-10-CM

## 2014-07-10 ENCOUNTER — Encounter: Payer: Self-pay | Admitting: Physician Assistant

## 2014-07-10 ENCOUNTER — Other Ambulatory Visit: Payer: Medicare Other

## 2014-07-10 ENCOUNTER — Ambulatory Visit (HOSPITAL_BASED_OUTPATIENT_CLINIC_OR_DEPARTMENT_OTHER): Payer: Medicare Other

## 2014-07-10 ENCOUNTER — Other Ambulatory Visit (HOSPITAL_BASED_OUTPATIENT_CLINIC_OR_DEPARTMENT_OTHER): Payer: Medicare Other

## 2014-07-10 ENCOUNTER — Telehealth: Payer: Self-pay | Admitting: Internal Medicine

## 2014-07-10 ENCOUNTER — Ambulatory Visit (HOSPITAL_BASED_OUTPATIENT_CLINIC_OR_DEPARTMENT_OTHER): Payer: Medicare Other | Admitting: Physician Assistant

## 2014-07-10 ENCOUNTER — Ambulatory Visit: Payer: Medicare Other

## 2014-07-10 VITALS — BP 124/79 | HR 118 | Temp 98.6°F | Resp 18 | Ht 71.0 in | Wt 181.0 lb

## 2014-07-10 DIAGNOSIS — C7951 Secondary malignant neoplasm of bone: Secondary | ICD-10-CM

## 2014-07-10 DIAGNOSIS — C3490 Malignant neoplasm of unspecified part of unspecified bronchus or lung: Secondary | ICD-10-CM

## 2014-07-10 DIAGNOSIS — C3432 Malignant neoplasm of lower lobe, left bronchus or lung: Secondary | ICD-10-CM

## 2014-07-10 DIAGNOSIS — C787 Secondary malignant neoplasm of liver and intrahepatic bile duct: Secondary | ICD-10-CM

## 2014-07-10 DIAGNOSIS — C3492 Malignant neoplasm of unspecified part of left bronchus or lung: Secondary | ICD-10-CM

## 2014-07-10 DIAGNOSIS — Z5111 Encounter for antineoplastic chemotherapy: Secondary | ICD-10-CM | POA: Diagnosis not present

## 2014-07-10 DIAGNOSIS — C342 Malignant neoplasm of middle lobe, bronchus or lung: Secondary | ICD-10-CM | POA: Diagnosis not present

## 2014-07-10 LAB — CBC WITH DIFFERENTIAL/PLATELET
BASO%: 0.7 % (ref 0.0–2.0)
BASOS ABS: 0 10*3/uL (ref 0.0–0.1)
EOS ABS: 0.1 10*3/uL (ref 0.0–0.5)
EOS%: 1.4 % (ref 0.0–7.0)
HCT: 31 % — ABNORMAL LOW (ref 38.4–49.9)
HGB: 10.1 g/dL — ABNORMAL LOW (ref 13.0–17.1)
LYMPH#: 0.9 10*3/uL (ref 0.9–3.3)
LYMPH%: 20 % (ref 14.0–49.0)
MCH: 21.4 pg — ABNORMAL LOW (ref 27.2–33.4)
MCHC: 32.7 g/dL (ref 32.0–36.0)
MCV: 65.5 fL — AB (ref 79.3–98.0)
MONO#: 0.7 10*3/uL (ref 0.1–0.9)
MONO%: 16.2 % — ABNORMAL HIGH (ref 0.0–14.0)
NEUT#: 2.7 10*3/uL (ref 1.5–6.5)
NEUT%: 61.7 % (ref 39.0–75.0)
Platelets: 282 10*3/uL (ref 140–400)
RBC: 4.73 10*6/uL (ref 4.20–5.82)
RDW: 13.8 % (ref 11.0–14.6)
WBC: 4.3 10*3/uL (ref 4.0–10.3)

## 2014-07-10 LAB — COMPREHENSIVE METABOLIC PANEL (CC13)
ALBUMIN: 3.9 g/dL (ref 3.5–5.0)
ALK PHOS: 122 U/L (ref 40–150)
ALT: 67 U/L — ABNORMAL HIGH (ref 0–55)
ANION GAP: 12 meq/L — AB (ref 3–11)
AST: 29 U/L (ref 5–34)
BUN: 19.9 mg/dL (ref 7.0–26.0)
CHLORIDE: 105 meq/L (ref 98–109)
CO2: 23 mEq/L (ref 22–29)
Calcium: 9.4 mg/dL (ref 8.4–10.4)
Creatinine: 1.6 mg/dL — ABNORMAL HIGH (ref 0.7–1.3)
EGFR: 56 mL/min/{1.73_m2} — ABNORMAL LOW (ref >90)
Glucose: 130 mg/dl (ref 70–140)
POTASSIUM: 4.5 meq/L (ref 3.5–5.1)
SODIUM: 140 meq/L (ref 136–145)
TOTAL PROTEIN: 8.2 g/dL (ref 6.4–8.3)
Total Bilirubin: 0.23 mg/dL (ref 0.20–1.20)

## 2014-07-10 MED ORDER — SODIUM CHLORIDE 0.9 % IV SOLN
Freq: Once | INTRAVENOUS | Status: AC
  Administered 2014-07-10: 16:00:00 via INTRAVENOUS
  Filled 2014-07-10: qty 8

## 2014-07-10 MED ORDER — SODIUM CHLORIDE 0.9 % IV SOLN
Freq: Once | INTRAVENOUS | Status: AC
  Administered 2014-07-10: 16:00:00 via INTRAVENOUS

## 2014-07-10 MED ORDER — SODIUM CHLORIDE 0.9 % IV SOLN
442.5000 mg | Freq: Once | INTRAVENOUS | Status: AC
  Administered 2014-07-10: 440 mg via INTRAVENOUS
  Filled 2014-07-10: qty 44

## 2014-07-10 MED ORDER — SODIUM CHLORIDE 0.9 % IV SOLN
2000.0000 mg | Freq: Once | INTRAVENOUS | Status: AC
  Administered 2014-07-10: 2000 mg via INTRAVENOUS
  Filled 2014-07-10: qty 52.6

## 2014-07-10 NOTE — Telephone Encounter (Signed)
Gave and printed appt sched and avs for pt for May and JUNE

## 2014-07-10 NOTE — Patient Instructions (Signed)
[facility] Discharge Instructions for Patients Receiving Chemotherapy  Today you received the following chemotherapy agents :  Gemcitabine, Carboplatin.  To help prevent nausea and vomiting after your treatment, we encourage you to take your nausea medication as prescribed.   If you develop nausea and vomiting that is not controlled by your nausea medication, call the clinic.   BELOW ARE SYMPTOMS THAT SHOULD BE REPORTED IMMEDIATELY:  *FEVER GREATER THAN 100.5 F  *CHILLS WITH OR WITHOUT FEVER  NAUSEA AND VOMITING THAT IS NOT CONTROLLED WITH YOUR NAUSEA MEDICATION  *UNUSUAL SHORTNESS OF BREATH  *UNUSUAL BRUISING OR BLEEDING  TENDERNESS IN MOUTH AND THROAT WITH OR WITHOUT PRESENCE OF ULCERS  *URINARY PROBLEMS  *BOWEL PROBLEMS  UNUSUAL RASH Items with * indicate a potential emergency and should be followed up as soon as possible.  Feel free to call the clinic you have any questions or concerns. The clinic phone number is [phone].  Please show the [place] at check-in to the Emergency Department and triage nurse.

## 2014-07-10 NOTE — Progress Notes (Addendum)
No images are attached to the encounter. No scans are attached to the encounter. No scans are attached to the encounter. [facility] SHARED VISIT PROGRESS NOTE  No PCP Per Patient No address on file  DIAGNOSIS: Non-small cell carcinoma of lung, stage 4   Staging form: Lung, AJCC 7th Edition     Clinical stage from 06/13/2014: Stage IV (T2a, N3, M1b) - Signed by Curt Bears, MD on 06/15/2014       Staging comments: Squamous cell carcinoma.  PRIOR THERAPY: None  CURRENT THERAPY: Systemic chemotherapy with carboplatin AUC 5 given on day 1 and gemcitabine 1000 mg/m2 given on days 1 and 8 every 3 weeks. Status post 1 cycle.  INTERVAL HISTORY: Keith Holloway 54 y.o. male returns for scheduled regular office visit for followup of his recently diagnosed stage 4 non-small cell lung cancer, squamous cell lung carcinoma. He is status post 1 cycle of chemotherapy with carboplatin and gemcitabine. He tolerated his first cycle of chemotherapy relatively well except for mild nausea and rare episodes of vomiting. He voiced no specific complaints today. He states that prior to his cancer diagnosis he was accepted to attend a camp for the blind and visually impaired from 08/18/2014 through 08/24/2014. He has medical clearance from his primary care provider but also wanted to obtain clearance from [place] to attend the camp.  MEDICAL HISTORY: Past Medical History  Diagnosis Date  . Diabetes mellitus without complication   . Hypertension   . High cholesterol   . Migraine   . Depression   . Arthritis   . Alcohol abuse   . Polysubstance abuse     alcohol and cocaine  . Polyneuropathy in diabetes(357.2)   . Memory disorder 08/22/2013  . CHF (congestive heart failure)     ALLERGIES:  has No Known Allergies.  MEDICATIONS:  Current Outpatient Prescriptions  Medication Sig Dispense Refill  . benzonatate (TESSALON) 100 MG capsule Take 1 capsule (100 mg total) by mouth every 8 (eight) hours.  21 capsule 0  . HYDROcodone-acetaminophen (NORCO/VICODIN) 5-325 MG per tablet Take 2 tablets by mouth every 6 (six) hours as needed. 60 tablet 0  . insulin NPH-regular Human (NOVOLIN 70/30) (70-30) 100 UNIT/ML injection Inject 35 Units into the skin 2 (two) times daily with a meal. 10 mL 11  . lisinopril (PRINIVIL,ZESTRIL) 20 MG tablet Take 1 tablet (20 mg total) by mouth daily. For kidney protection 60 tablet 2  . metFORMIN (GLUCOPHAGE) 1000 MG tablet Take 1 tablet (1,000 mg total) by mouth 2 (two) times daily. 60 tablet 12  . prochlorperazine (COMPAZINE) 10 MG tablet Take 1 tablet (10 mg total) by mouth every 6 (six) hours as needed for nausea or vomiting. 30 tablet 0  . simvastatin (ZOCOR) 40 MG tablet Take 1 tablet (40 mg total) by mouth daily. 30 tablet 3   No current facility-administered medications for this visit.   Facility-Administered Medications Ordered in Other Visits  Medication Dose Route Frequency Provider Last Rate Last Dose  . CARBOplatin (PARAPLATIN) 440 mg in sodium chloride 0.9 % 250 mL chemo infusion  440 mg Intravenous Once Curt Bears, MD      . Gemcitabine HCl (GEMZAR) 2,000 mg in sodium chloride 0.9 % 100 mL chemo infusion  2,000 mg Intravenous Once Curt Bears, MD 305 mL/hr at 07/10/14 1605 2,000 mg at 07/10/14 1605    SURGICAL HISTORY:  Past Surgical History  Procedure Laterality Date  . Eye surgery  04/2012    detatched retna, bilateral  .  Eye surgery  08/2012  . Cataract extraction      bilaterally    REVIEW OF SYSTEMS:  Review of Systems  Constitutional: Negative for fever, chills, weight loss, malaise/fatigue and diaphoresis.  HENT: Negative for congestion, ear discharge, ear pain, hearing loss, nosebleeds, sore throat and tinnitus.   Eyes: Negative for blurred vision, double vision, photophobia, pain, discharge and redness.  Respiratory: Negative for cough, hemoptysis, sputum production, shortness of breath, wheezing and stridor.    Cardiovascular: Negative for chest pain, palpitations, orthopnea, claudication, leg swelling and PND.  Gastrointestinal: Positive for nausea and vomiting. Negative for heartburn, abdominal pain, diarrhea, constipation, blood in stool and melena.  Genitourinary: Negative.   Musculoskeletal: Negative.   Skin: Negative.   Neurological: Negative for dizziness, tingling, focal weakness, seizures, weakness and headaches.  Endo/Heme/Allergies: Does not bruise/bleed easily.  Psychiatric/Behavioral: Negative for depression. The patient is not nervous/anxious and does not have insomnia.      PHYSICAL EXAMINATION: Physical Exam  Constitutional: He is oriented to person, place, and time and well-developed, well-nourished, and in no distress.  HENT:  Head: Normocephalic and atraumatic.  Mouth/Throat: Oropharynx is clear and moist.  Eyes: Pupils are equal, round, and reactive to light.  Neck: Normal range of motion. Neck supple. No JVD present. No tracheal deviation present. No thyromegaly present.  Cardiovascular: Normal rate, regular rhythm, normal heart sounds and intact distal pulses.  Exam reveals no gallop and no friction rub.   No murmur heard. Pulmonary/Chest: Effort normal and breath sounds normal. No respiratory distress. He has no wheezes. He has no rales.  Abdominal: Soft. Bowel sounds are normal. He exhibits no distension and no mass. There is no tenderness.  Musculoskeletal: Normal range of motion. He exhibits no edema or tenderness.  Lymphadenopathy:    He has no cervical adenopathy.  Neurological: He is alert and oriented to person, place, and time. He has normal reflexes. Gait normal.  Skin: Skin is warm and dry. No rash noted.    ECOG PERFORMANCE STATUS: 1 - Symptomatic but completely ambulatory  Blood pressure 124/79, pulse 118, temperature 98.6 F (37 C), temperature source Oral, resp. rate 18, height '5\' 11"'$  (1.803 m), weight 181 lb (82.101 kg), SpO2 100 %.  LABORATORY  DATA: Lab Results  Component Value Date   WBC 4.3 07/10/2014   HGB 10.1* 07/10/2014   HCT 31.0* 07/10/2014   MCV 65.5* 07/10/2014   PLT 282 07/10/2014      Chemistry      Component Value Date/Time   NA 140 07/10/2014 1330   NA 136 05/05/2014 2010   K 4.5 07/10/2014 1330   K 4.0 05/05/2014 2010   CL 102 05/05/2014 2010   CO2 23 07/10/2014 1330   CO2 28 05/05/2014 2010   BUN 19.9 07/10/2014 1330   BUN 14 05/05/2014 2010   CREATININE 1.6* 07/10/2014 1330   CREATININE 1.26 05/05/2014 2010   CREATININE 1.02 01/23/2014 1313      Component Value Date/Time   CALCIUM 9.4 07/10/2014 1330   CALCIUM 9.4 05/05/2014 2010   ALKPHOS 122 07/10/2014 1330   ALKPHOS 108 05/05/2014 2049   AST 29 07/10/2014 1330   AST 20 05/05/2014 2049   ALT 67* 07/10/2014 1330   ALT 24 05/05/2014 2049   BILITOT 0.23 07/10/2014 1330   BILITOT 0.5 05/05/2014 2049       RADIOGRAPHIC STUDIES:  No results found. The patient is a pleasant 54 year old African American male reently diagnosed with stage IV squamous cell  carcinoma. He is currently being treated with systemic chemotherapy with carboplatin AUC 5 given on day1 and gemcitabine 1000 mg/m2 given on days 1 and 8 every 3 weeks. He is status post 1 cycle. The patient was discussed with and also seen by Dr. Julien Nordmann. He will proceed with cycle #2 today as scheduled. He will continue with labs and chemotherapy as scheduled and return in 3 weeks prior to the start of cycle #3. He was given a letter stating that he was stable from our standpoint and may attend the camp as scheduled.  ASSESSMENT/PLAN:  No problem-specific assessment & plan notes found for this encounter.   Awilda Metro E, PA-C 07/10/2014  All questions were answered. The patient knows to call the clinic with any problems, questions or concerns. We can certainly see the patient much sooner if necessary.  ADDENDUM: Hematology/Oncology Attending: I had a face to face encounter with the  patient. I recommended his care plan.  This is a very pleasant 54 years old African-American male with metastatic non-small cell lung cancer, squamous cell carcinoma who is currently undergoing systemic chemotherapy with carboplatin and gemcitabine status post 1 cycle. The patient tolerated the first cycle of his treatment fairly well with no significant adverse effects. He noticed some improvement in the cough and shortness of breath after starting the chemotherapy. I recommended for him to proceed with cycle #2 today as scheduled. The patient would come back for follow-up visit in 3 weeks for reevaluation before starting cycle #3. He was advised to call immediately if he has any concerning symptoms in the interval.  Disclaimer: This note was dictated with voice recognition software. Similar sounding words can inadvertently be transcribed and may be missed upon review. Eilleen Kempf., MD 07/15/2014

## 2014-07-12 ENCOUNTER — Encounter: Payer: Self-pay | Admitting: *Deleted

## 2014-07-12 NOTE — Progress Notes (Signed)
[facility] Psychosocial Distress Screening Clinical Social Work  Clinical Social Work was referred by distress screening protocol.  The patient scored a 8 on the Psychosocial Distress Thermometer which indicates severe distress. Clinical Social Worker contacted patient and spouse by phone to assess for distress and other psychosocial needs. CSW spoke mainly with Mrs. Sylve regarding patient's status.  She shared she is relieved he is maintaining his weight and he is experiencing less pain and discomfort.  Mrs. Kostelnik shared she is dealing with her own health problems and goes to the doctor weekly to address back issues.  She reported she is doing her best to care for herself and Mr. Yom.  She will rely on her children to receive support when she has surgery.   Mrs. Yom reported she feels Mr. Glidden is coping fairly well through treatment- she shared he thinks the cancer is going to cure him, but then states other things that make her feel that he does understand his prognosis.  CSW discussed the concept of denial and talked about healthy denial skills that patients often use to help cope with a cancer situation.  CSW encouraged patient/family to reach out to CSW to process their emotions and fears related to cancer in the future.  Mrs. Kessenich was really appreciate of conversation and plans to reach out to CSW as needed.  ON[facility] DISTRESS SCREENING 06/26/2014  Screening Type Change in Status  Distress experienced in past week (1-10) 8  Emotional problem type Adjusting to illness  Physical Problem type Pain;Breathing;Loss of appetitie;Constipation/diarrhea  Physician notified of physical symptoms Yes  Referral to clinical social work   Referral to [facility] 06/13/2014  Screening Type Initial Screening  Distress experienced in past week (1-10) 9  Emotional problem type   Physical Problem type Pain;Breathing;Tingling hands/feet;Loss of appetitie   Physician notified of physical symptoms   Referral to clinical social work Yes  Referral to dietition Yes    Clinical Social Worker follow up needed: No.  If yes, follow up plan:  Polo Riley, MSW, LCSW, OSW-C Clinical Social Worker [facility] [phone]

## 2014-07-15 NOTE — Patient Instructions (Signed)
Continue labs and chemotherapy as scheduled Follow up in 3 weeks, prior to the start of your next scheduled cycle of chemotherapy 

## 2014-07-17 ENCOUNTER — Ambulatory Visit (HOSPITAL_BASED_OUTPATIENT_CLINIC_OR_DEPARTMENT_OTHER): Payer: Medicare Other

## 2014-07-17 ENCOUNTER — Other Ambulatory Visit: Payer: Medicare Other

## 2014-07-17 ENCOUNTER — Ambulatory Visit: Payer: Medicare Other | Admitting: Nutrition

## 2014-07-17 ENCOUNTER — Other Ambulatory Visit (HOSPITAL_BASED_OUTPATIENT_CLINIC_OR_DEPARTMENT_OTHER): Payer: Medicare Other

## 2014-07-17 VITALS — BP 144/85 | HR 86 | Temp 97.6°F | Resp 18

## 2014-07-17 DIAGNOSIS — C7951 Secondary malignant neoplasm of bone: Secondary | ICD-10-CM | POA: Diagnosis not present

## 2014-07-17 DIAGNOSIS — Z5111 Encounter for antineoplastic chemotherapy: Secondary | ICD-10-CM | POA: Diagnosis not present

## 2014-07-17 DIAGNOSIS — C3490 Malignant neoplasm of unspecified part of unspecified bronchus or lung: Secondary | ICD-10-CM

## 2014-07-17 DIAGNOSIS — C3432 Malignant neoplasm of lower lobe, left bronchus or lung: Secondary | ICD-10-CM

## 2014-07-17 DIAGNOSIS — C3492 Malignant neoplasm of unspecified part of left bronchus or lung: Secondary | ICD-10-CM

## 2014-07-17 DIAGNOSIS — C787 Secondary malignant neoplasm of liver and intrahepatic bile duct: Secondary | ICD-10-CM

## 2014-07-17 LAB — CBC WITH DIFFERENTIAL/PLATELET
BASO%: 1.6 % (ref 0.0–2.0)
Basophils Absolute: 0 10*3/uL (ref 0.0–0.1)
EOS ABS: 0 10*3/uL (ref 0.0–0.5)
EOS%: 0.7 % (ref 0.0–7.0)
HEMATOCRIT: 27.6 % — AB (ref 38.4–49.9)
HGB: 9 g/dL — ABNORMAL LOW (ref 13.0–17.1)
LYMPH%: 30.7 % (ref 14.0–49.0)
MCH: 21.2 pg — ABNORMAL LOW (ref 27.2–33.4)
MCHC: 32.6 g/dL (ref 32.0–36.0)
MCV: 65.3 fL — AB (ref 79.3–98.0)
MONO#: 0.2 10*3/uL (ref 0.1–0.9)
MONO%: 8 % (ref 0.0–14.0)
NEUT#: 1.3 10*3/uL — ABNORMAL LOW (ref 1.5–6.5)
NEUT%: 59 % (ref 39.0–75.0)
PLATELETS: 459 10*3/uL — AB (ref 140–400)
RBC: 4.22 10*6/uL (ref 4.20–5.82)
RDW: 14 % (ref 11.0–14.6)
WBC: 2.3 10*3/uL — ABNORMAL LOW (ref 4.0–10.3)
lymph#: 0.7 10*3/uL — ABNORMAL LOW (ref 0.9–3.3)

## 2014-07-17 LAB — COMPREHENSIVE METABOLIC PANEL (CC13)
ALT: 65 U/L — ABNORMAL HIGH (ref 0–55)
AST: 41 U/L — AB (ref 5–34)
Albumin: 3.6 g/dL (ref 3.5–5.0)
Alkaline Phosphatase: 99 U/L (ref 40–150)
Anion Gap: 11 mEq/L (ref 3–11)
BUN: 29.6 mg/dL — ABNORMAL HIGH (ref 7.0–26.0)
CALCIUM: 8.8 mg/dL (ref 8.4–10.4)
CHLORIDE: 106 meq/L (ref 98–109)
CO2: 22 mEq/L (ref 22–29)
Creatinine: 2 mg/dL — ABNORMAL HIGH (ref 0.7–1.3)
EGFR: 43 mL/min/{1.73_m2} — AB (ref >90)
Glucose: 149 mg/dl — ABNORMAL HIGH (ref 70–140)
Potassium: 4.5 mEq/L (ref 3.5–5.1)
SODIUM: 139 meq/L (ref 136–145)
Total Bilirubin: 0.26 mg/dL (ref 0.20–1.20)
Total Protein: 7.8 g/dL (ref 6.4–8.3)

## 2014-07-17 MED ORDER — PROCHLORPERAZINE MALEATE 10 MG PO TABS
ORAL_TABLET | ORAL | Status: AC
  Filled 2014-07-17: qty 1

## 2014-07-17 MED ORDER — SODIUM CHLORIDE 0.9 % IV SOLN
Freq: Once | INTRAVENOUS | Status: AC
  Administered 2014-07-17: 09:00:00 via INTRAVENOUS

## 2014-07-17 MED ORDER — SODIUM CHLORIDE 0.9 % IV SOLN
2000.0000 mg | Freq: Once | INTRAVENOUS | Status: AC
  Administered 2014-07-17: 2000 mg via INTRAVENOUS
  Filled 2014-07-17: qty 52.6

## 2014-07-17 MED ORDER — PROCHLORPERAZINE MALEATE 10 MG PO TABS
10.0000 mg | ORAL_TABLET | Freq: Once | ORAL | Status: AC
  Administered 2014-07-17: 10 mg via ORAL

## 2014-07-17 NOTE — Patient Instructions (Signed)
[facility] Discharge Instructions for Patients Receiving Chemotherapy  Today you received the following chemotherapy agents Gemzar.  To help prevent nausea and vomiting after your treatment, we encourage you to take your nausea medication.   If you develop nausea and vomiting that is not controlled by your nausea medication, call the clinic.   BELOW ARE SYMPTOMS THAT SHOULD BE REPORTED IMMEDIATELY:  *FEVER GREATER THAN 100.5 F  *CHILLS WITH OR WITHOUT FEVER  NAUSEA AND VOMITING THAT IS NOT CONTROLLED WITH YOUR NAUSEA MEDICATION  *UNUSUAL SHORTNESS OF BREATH  *UNUSUAL BRUISING OR BLEEDING  TENDERNESS IN MOUTH AND THROAT WITH OR WITHOUT PRESENCE OF ULCERS  *URINARY PROBLEMS  *BOWEL PROBLEMS  UNUSUAL RASH Items with * indicate a potential emergency and should be followed up as soon as possible.  Feel free to call the clinic you have any questions or concerns. The clinic phone number is [phone].  Please show the CHEMO ALERT CARD at check-in to the Emergency Department and triage nurse.   

## 2014-07-17 NOTE — Progress Notes (Signed)
Labs reviewed with MD, ok to treat.

## 2014-07-17 NOTE — Progress Notes (Signed)
Follow-up completed with patient during chemotherapy.   Patient's weight is stable at 181 pounds. He denies nutrition impact symptoms. Nausea has improved on nausea medication. Patient denies pain at this time. Taste alterations and oral intake have improved. Patient continues to drink Ensure Plus twice a day.  Nutrition diagnosis: Inadequate oral intake improved.  Intervention:  Educated patient to continue strategies for adequate oral intake for weight maintenance. Stressed importance of continued nausea medication as needed. Provided additional coupons for Ensure Plus. Questions were answered.  Teach back method was used.  Monitoring, evaluation, goals:  Patient will tolerate adequate calories and protein to continue weight maintenance.  Next visit: Wednesday, June 22, during chemotherapy.  **Disclaimer: This note was dictated with voice recognition software. Similar sounding words can inadvertently be transcribed and this note may contain transcription errors which may not have been corrected upon publication of note.**

## 2014-07-24 ENCOUNTER — Other Ambulatory Visit: Payer: Medicare Other

## 2014-07-30 ENCOUNTER — Other Ambulatory Visit: Payer: Self-pay | Admitting: Medical Oncology

## 2014-07-30 DIAGNOSIS — C3490 Malignant neoplasm of unspecified part of unspecified bronchus or lung: Secondary | ICD-10-CM

## 2014-07-31 ENCOUNTER — Telehealth: Payer: Self-pay | Admitting: Nurse Practitioner

## 2014-07-31 ENCOUNTER — Ambulatory Visit (HOSPITAL_BASED_OUTPATIENT_CLINIC_OR_DEPARTMENT_OTHER): Payer: Medicare Other

## 2014-07-31 ENCOUNTER — Telehealth: Payer: Self-pay | Admitting: *Deleted

## 2014-07-31 ENCOUNTER — Ambulatory Visit (HOSPITAL_BASED_OUTPATIENT_CLINIC_OR_DEPARTMENT_OTHER): Payer: Medicare Other | Admitting: Nurse Practitioner

## 2014-07-31 ENCOUNTER — Other Ambulatory Visit (HOSPITAL_BASED_OUTPATIENT_CLINIC_OR_DEPARTMENT_OTHER): Payer: Medicare Other

## 2014-07-31 ENCOUNTER — Telehealth: Payer: Self-pay | Admitting: Internal Medicine

## 2014-07-31 VITALS — BP 131/80 | HR 97 | Temp 97.8°F | Resp 20 | Ht 71.0 in | Wt 184.9 lb

## 2014-07-31 DIAGNOSIS — C787 Secondary malignant neoplasm of liver and intrahepatic bile duct: Secondary | ICD-10-CM

## 2014-07-31 DIAGNOSIS — C7951 Secondary malignant neoplasm of bone: Secondary | ICD-10-CM

## 2014-07-31 DIAGNOSIS — C3432 Malignant neoplasm of lower lobe, left bronchus or lung: Secondary | ICD-10-CM | POA: Diagnosis present

## 2014-07-31 DIAGNOSIS — Z5111 Encounter for antineoplastic chemotherapy: Secondary | ICD-10-CM | POA: Diagnosis present

## 2014-07-31 DIAGNOSIS — C3490 Malignant neoplasm of unspecified part of unspecified bronchus or lung: Secondary | ICD-10-CM

## 2014-07-31 DIAGNOSIS — C3492 Malignant neoplasm of unspecified part of left bronchus or lung: Secondary | ICD-10-CM

## 2014-07-31 LAB — CBC WITH DIFFERENTIAL/PLATELET
BASO%: 0.3 % (ref 0.0–2.0)
BASOS ABS: 0 10*3/uL (ref 0.0–0.1)
EOS%: 1.3 % (ref 0.0–7.0)
Eosinophils Absolute: 0.1 10*3/uL (ref 0.0–0.5)
HEMATOCRIT: 26.7 % — AB (ref 38.4–49.9)
HGB: 8.8 g/dL — ABNORMAL LOW (ref 13.0–17.1)
LYMPH%: 22 % (ref 14.0–49.0)
MCH: 22 pg — AB (ref 27.2–33.4)
MCHC: 33 g/dL (ref 32.0–36.0)
MCV: 66.8 fL — AB (ref 79.3–98.0)
MONO#: 0.7 10*3/uL (ref 0.1–0.9)
MONO%: 17.6 % — ABNORMAL HIGH (ref 0.0–14.0)
NEUT#: 2.3 10*3/uL (ref 1.5–6.5)
NEUT%: 58.8 % (ref 39.0–75.0)
Platelets: 191 10*3/uL (ref 140–400)
RBC: 4 10*6/uL — ABNORMAL LOW (ref 4.20–5.82)
RDW: 15.9 % — ABNORMAL HIGH (ref 11.0–14.6)
WBC: 3.9 10*3/uL — ABNORMAL LOW (ref 4.0–10.3)
lymph#: 0.9 10*3/uL (ref 0.9–3.3)

## 2014-07-31 LAB — COMPREHENSIVE METABOLIC PANEL (CC13)
ALK PHOS: 120 U/L (ref 40–150)
ALT: 49 U/L (ref 0–55)
AST: 28 U/L (ref 5–34)
Albumin: 3.7 g/dL (ref 3.5–5.0)
Anion Gap: 12 mEq/L — ABNORMAL HIGH (ref 3–11)
BILIRUBIN TOTAL: 0.29 mg/dL (ref 0.20–1.20)
BUN: 13.3 mg/dL (ref 7.0–26.0)
CO2: 23 mEq/L (ref 22–29)
Calcium: 9.2 mg/dL (ref 8.4–10.4)
Chloride: 104 mEq/L (ref 98–109)
Creatinine: 1.5 mg/dL — ABNORMAL HIGH (ref 0.7–1.3)
EGFR: 60 mL/min/{1.73_m2} — AB (ref >90)
GLUCOSE: 228 mg/dL — AB (ref 70–140)
Potassium: 4.4 mEq/L (ref 3.5–5.1)
SODIUM: 140 meq/L (ref 136–145)
TOTAL PROTEIN: 7.9 g/dL (ref 6.4–8.3)

## 2014-07-31 MED ORDER — SODIUM CHLORIDE 0.9 % IV SOLN
Freq: Once | INTRAVENOUS | Status: AC
  Administered 2014-07-31: 10:00:00 via INTRAVENOUS

## 2014-07-31 MED ORDER — SODIUM CHLORIDE 0.9 % IV SOLN
2000.0000 mg | Freq: Once | INTRAVENOUS | Status: AC
  Administered 2014-07-31: 2000 mg via INTRAVENOUS
  Filled 2014-07-31: qty 52.6

## 2014-07-31 MED ORDER — DEXAMETHASONE SODIUM PHOSPHATE 100 MG/10ML IJ SOLN
Freq: Once | INTRAMUSCULAR | Status: AC
  Administered 2014-07-31: 10:00:00 via INTRAVENOUS
  Filled 2014-07-31: qty 8

## 2014-07-31 MED ORDER — SODIUM CHLORIDE 0.9 % IV SOLN
440.0000 mg | Freq: Once | INTRAVENOUS | Status: AC
  Administered 2014-07-31: 440 mg via INTRAVENOUS
  Filled 2014-07-31: qty 44

## 2014-07-31 NOTE — Telephone Encounter (Signed)
-----   Message from Owens Shark, NP sent at 07/31/2014 10:00 AM EDT ----- Please let Mr. Cotten know the CT scans will be done without contrast due to his kidney function. Dr. Julien Nordmann would like him to increase fluid intake.

## 2014-07-31 NOTE — Progress Notes (Signed)
  [facility] OFFICE PROGRESS NOTE   DIAGNOSIS: Non-small cell carcinoma of lung, stage 4  Staging form: Lung, AJCC 7th Edition  Clinical stage from 06/13/2014: Stage IV (T2a, N3, M1b) - Signed by Curt Bears, MD on 06/15/2014 Staging comments: Squamous cell carcinoma.  PRIOR THERAPY: None  CURRENT THERAPY: Systemic chemotherapy with carboplatin AUC 5 given on day 1 and gemcitabine 1000 mg/m2 given on days 1 and 8 every 3 weeks. Status post 2 cycles.   INTERVAL HISTORY:   Keith Holloway returns as scheduled. He completed cycle 2 carboplatin/gemcitabine beginning 07/10/2014. He denies nausea/vomiting. No mouth sores. No diarrhea. He has a good appetite. He is gaining weight. He has stable dyspnea on exertion. He has an occasional cough. No fever. He denies pain. He has taken no pain medication in one month. No known bleeding.  Objective:  Vital signs in last 24 hours:  Blood pressure 131/80, pulse 97, temperature 97.8 F (36.6 C), temperature source Oral, resp. rate 20, height '5\' 11"'$  (1.803 m), weight 184 lb 14.4 oz (83.87 kg), SpO2 100 %.    HEENT: No thrush or ulcers. Lymphatics: No palpable cervical or supra-clavicular lymph nodes. Resp: Lungs clear bilaterally. Cardio: Regular rate and rhythm. GI: Abdomen soft and nontender. No hepatomegaly. Vascular: No leg edema. Skin: No rash.    Lab Results:  Lab Results  Component Value Date   WBC 3.9* 07/31/2014   HGB 8.8* 07/31/2014   HCT 26.7* 07/31/2014   MCV 66.8* 07/31/2014   PLT 191 07/31/2014   NEUTROABS 2.3 07/31/2014    Imaging:  No results found.  Medications: I have reviewed the patient's current medications.  Assessment/Plan: 1. Stage IV (T2a, N3, M1b) non-small cell lung cancer, squamous cell carcinoma; presented with a left infrahilar mass in addition to bulky mediastinal lymphadenopathy as well as bone and liver disease diagnosed in March 2016. Initiation of carboplatin AUC 5 on day  1 and gemcitabine 1000 g/m on days 1 and 8 every 3 weeks beginning 06/21/2014.   Disposition: Keith Holloway has completed 2 cycles of carboplatin/gemcitabine. His pain has markedly improved. Plan to proceed with cycle 3 today as scheduled.   We will obtain restaging CT scans prior to cycle 4. The scans will be done without contrast due to his kidney function. We gave him instructions to increase intake of fluids.  His next visit and cycle 4 carboplatin/gemcitabine will be at a 4 week interval per his request so he can attend a camp. He will return for a follow-up visit and cycle 4 on 08/28/2014. He will contact the office in the interim with any problems.  Plan reviewed with Dr. Julien Nordmann.    [name] ANP/GNP-BC   07/31/2014  9:50 AM

## 2014-07-31 NOTE — Telephone Encounter (Signed)
Informed patient that CT scans will be done without contrast due to kidney function and to increase fluid intake.  Per Dr. Julien Nordmann.  Patient verbalized understanding.

## 2014-07-31 NOTE — Telephone Encounter (Signed)
Pt wife came in to cx 7.9 appt due to being out of town....done.

## 2014-07-31 NOTE — Patient Instructions (Signed)
[facility] Discharge Instructions for Patients Receiving Chemotherapy  Today you received the following chemotherapy agents gemzar/carboplatin  To help prevent nausea and vomiting after your treatment, we encourage you to take your nausea medication as directed   If you develop nausea and vomiting that is not controlled by your nausea medication, call the clinic.   BELOW ARE SYMPTOMS THAT SHOULD BE REPORTED IMMEDIATELY:  *FEVER GREATER THAN 100.5 F  *CHILLS WITH OR WITHOUT FEVER  NAUSEA AND VOMITING THAT IS NOT CONTROLLED WITH YOUR NAUSEA MEDICATION  *UNUSUAL SHORTNESS OF BREATH  *UNUSUAL BRUISING OR BLEEDING  TENDERNESS IN MOUTH AND THROAT WITH OR WITHOUT PRESENCE OF ULCERS  *URINARY PROBLEMS  *BOWEL PROBLEMS  UNUSUAL RASH Items with * indicate a potential emergency and should be followed up as soon as possible.  Feel free to call the clinic you have any questions or concerns. The clinic phone number is [phone].  

## 2014-07-31 NOTE — Telephone Encounter (Signed)
Per staff message and POF I have scheduled appts. Advised scheduler of appts. JMW  

## 2014-07-31 NOTE — Telephone Encounter (Signed)
per pof to sch pt appt-sent MW emailt o sch pt trmt-gave pt avs

## 2014-08-07 ENCOUNTER — Other Ambulatory Visit (HOSPITAL_BASED_OUTPATIENT_CLINIC_OR_DEPARTMENT_OTHER): Payer: Medicare Other

## 2014-08-07 ENCOUNTER — Ambulatory Visit: Payer: Medicare Other | Admitting: Nutrition

## 2014-08-07 ENCOUNTER — Other Ambulatory Visit: Payer: Self-pay | Admitting: Medical Oncology

## 2014-08-07 ENCOUNTER — Ambulatory Visit (HOSPITAL_BASED_OUTPATIENT_CLINIC_OR_DEPARTMENT_OTHER): Payer: Medicare Other

## 2014-08-07 VITALS — BP 139/75 | HR 95 | Temp 98.2°F | Resp 18

## 2014-08-07 DIAGNOSIS — Z5111 Encounter for antineoplastic chemotherapy: Secondary | ICD-10-CM | POA: Diagnosis present

## 2014-08-07 DIAGNOSIS — C7951 Secondary malignant neoplasm of bone: Secondary | ICD-10-CM

## 2014-08-07 DIAGNOSIS — C3432 Malignant neoplasm of lower lobe, left bronchus or lung: Secondary | ICD-10-CM | POA: Diagnosis not present

## 2014-08-07 DIAGNOSIS — C787 Secondary malignant neoplasm of liver and intrahepatic bile duct: Secondary | ICD-10-CM | POA: Diagnosis not present

## 2014-08-07 DIAGNOSIS — C3492 Malignant neoplasm of unspecified part of left bronchus or lung: Secondary | ICD-10-CM

## 2014-08-07 LAB — CBC WITH DIFFERENTIAL/PLATELET
BASO%: 0.4 % (ref 0.0–2.0)
BASOS ABS: 0 10*3/uL (ref 0.0–0.1)
EOS%: 0.4 % (ref 0.0–7.0)
Eosinophils Absolute: 0 10*3/uL (ref 0.0–0.5)
HEMATOCRIT: 25 % — AB (ref 38.4–49.9)
HGB: 8.5 g/dL — ABNORMAL LOW (ref 13.0–17.1)
LYMPH%: 36.1 % (ref 14.0–49.0)
MCH: 22.5 pg — AB (ref 27.2–33.4)
MCHC: 34 g/dL (ref 32.0–36.0)
MCV: 66.3 fL — ABNORMAL LOW (ref 79.3–98.0)
MONO#: 0.2 10*3/uL (ref 0.1–0.9)
MONO%: 7.4 % (ref 0.0–14.0)
NEUT#: 1.3 10*3/uL — ABNORMAL LOW (ref 1.5–6.5)
NEUT%: 55.7 % (ref 39.0–75.0)
Platelets: 366 10*3/uL (ref 140–400)
RBC: 3.77 10*6/uL — ABNORMAL LOW (ref 4.20–5.82)
RDW: 16.6 % — ABNORMAL HIGH (ref 11.0–14.6)
WBC: 2.3 10*3/uL — ABNORMAL LOW (ref 4.0–10.3)
lymph#: 0.8 10*3/uL — ABNORMAL LOW (ref 0.9–3.3)

## 2014-08-07 LAB — COMPREHENSIVE METABOLIC PANEL (CC13)
ALT: 60 U/L — AB (ref 0–55)
ANION GAP: 8 meq/L (ref 3–11)
AST: 37 U/L — ABNORMAL HIGH (ref 5–34)
Albumin: 3.9 g/dL (ref 3.5–5.0)
Alkaline Phosphatase: 115 U/L (ref 40–150)
BILIRUBIN TOTAL: 0.26 mg/dL (ref 0.20–1.20)
BUN: 12.8 mg/dL (ref 7.0–26.0)
CALCIUM: 9.5 mg/dL (ref 8.4–10.4)
CHLORIDE: 107 meq/L (ref 98–109)
CO2: 27 meq/L (ref 22–29)
Creatinine: 1.4 mg/dL — ABNORMAL HIGH (ref 0.7–1.3)
EGFR: 65 mL/min/{1.73_m2} — AB (ref >90)
GLUCOSE: 121 mg/dL (ref 70–140)
Potassium: 4.2 mEq/L (ref 3.5–5.1)
Sodium: 142 mEq/L (ref 136–145)
Total Protein: 8 g/dL (ref 6.4–8.3)

## 2014-08-07 MED ORDER — SODIUM CHLORIDE 0.9 % IV SOLN
Freq: Once | INTRAVENOUS | Status: AC
  Administered 2014-08-07: 09:00:00 via INTRAVENOUS

## 2014-08-07 MED ORDER — GEMCITABINE HCL CHEMO INJECTION 1 GM/26.3ML
2000.0000 mg | Freq: Once | INTRAVENOUS | Status: AC
  Administered 2014-08-07: 2000 mg via INTRAVENOUS
  Filled 2014-08-07: qty 52.6

## 2014-08-07 MED ORDER — PROCHLORPERAZINE MALEATE 10 MG PO TABS
10.0000 mg | ORAL_TABLET | Freq: Once | ORAL | Status: AC
  Administered 2014-08-07: 10 mg via ORAL

## 2014-08-07 MED ORDER — PROCHLORPERAZINE MALEATE 10 MG PO TABS
ORAL_TABLET | ORAL | Status: AC
  Filled 2014-08-07: qty 1

## 2014-08-07 NOTE — Patient Instructions (Signed)
[facility] Discharge Instructions for Patients Receiving Chemotherapy  Today you received the following chemotherapy agents Gemzar  To help prevent nausea and vomiting after your treatment, we encourage you to take your nausea medication as prescribed   If you develop nausea and vomiting that is not controlled by your nausea medication, call the clinic.   BELOW ARE SYMPTOMS THAT SHOULD BE REPORTED IMMEDIATELY:  *FEVER GREATER THAN 100.5 F  *CHILLS WITH OR WITHOUT FEVER  NAUSEA AND VOMITING THAT IS NOT CONTROLLED WITH YOUR NAUSEA MEDICATION  *UNUSUAL SHORTNESS OF BREATH  *UNUSUAL BRUISING OR BLEEDING  TENDERNESS IN MOUTH AND THROAT WITH OR WITHOUT PRESENCE OF ULCERS  *URINARY PROBLEMS  *BOWEL PROBLEMS  UNUSUAL RASH Items with * indicate a potential emergency and should be followed up as soon as possible.  Feel free to call the clinic you have any questions or concerns. The clinic phone number is [phone].  Please show the CHEMO ALERT CARD at check-in to the Emergency Department and triage nurse.   

## 2014-08-07 NOTE — Progress Notes (Signed)
Nutrition follow-up completed with patient during chemotherapy for stage IV non-small cell lung cancer. Patient's weight has increased and was documented as 184 pounds increased from 181 pounds. Patient reports he continues to have some nausea especially with smell of food however nausea medication is effective. Patient denies pain or other nutrition impact symptoms. Patient tries to drink Ensure Plus 3 times a day.  Nutrition diagnosis: Inadequate oral intake improved.  Intervention: Patient educated to continue strategies for maintaining weight during treatment. Offered patient additional coupons.  However, he declined today. Continue current strategies for controlling nausea. Questions were answered.  Teach back method was used.  Monitoring, evaluation, goals: Patient will tolerate adequate calories and protein for weight maintenance/gain.  Next visit: Wednesday, July 13, during chemotherapy.  **Disclaimer: This note was dictated with voice recognition software. Similar sounding words can inadvertently be transcribed and this note may contain transcription errors which may not have been corrected upon publication of note.**

## 2014-08-07 NOTE — Progress Notes (Signed)
Per Dr Julien Nordmann it is okay to treat pt today with chemotherapy  And today's labs.

## 2014-08-07 NOTE — Progress Notes (Signed)
Per Dr Mohamed it is okay to treat pt today with chemo and todays labs.  

## 2014-08-14 ENCOUNTER — Other Ambulatory Visit (HOSPITAL_BASED_OUTPATIENT_CLINIC_OR_DEPARTMENT_OTHER): Payer: Medicare Other

## 2014-08-14 ENCOUNTER — Other Ambulatory Visit: Payer: Self-pay | Admitting: Internal Medicine

## 2014-08-14 ENCOUNTER — Other Ambulatory Visit: Payer: Self-pay | Admitting: Medical Oncology

## 2014-08-14 ENCOUNTER — Telehealth: Payer: Self-pay | Admitting: Internal Medicine

## 2014-08-14 ENCOUNTER — Ambulatory Visit (HOSPITAL_BASED_OUTPATIENT_CLINIC_OR_DEPARTMENT_OTHER): Payer: Medicare Other

## 2014-08-14 VITALS — BP 117/70 | HR 93 | Temp 97.7°F | Resp 18

## 2014-08-14 DIAGNOSIS — C3432 Malignant neoplasm of lower lobe, left bronchus or lung: Secondary | ICD-10-CM | POA: Diagnosis not present

## 2014-08-14 DIAGNOSIS — R799 Abnormal finding of blood chemistry, unspecified: Secondary | ICD-10-CM | POA: Diagnosis not present

## 2014-08-14 DIAGNOSIS — C3492 Malignant neoplasm of unspecified part of left bronchus or lung: Secondary | ICD-10-CM

## 2014-08-14 DIAGNOSIS — C3490 Malignant neoplasm of unspecified part of unspecified bronchus or lung: Secondary | ICD-10-CM

## 2014-08-14 LAB — CBC WITH DIFFERENTIAL/PLATELET
BASO%: 0.7 % (ref 0.0–2.0)
Basophils Absolute: 0 10*3/uL (ref 0.0–0.1)
EOS%: 0 % (ref 0.0–7.0)
Eosinophils Absolute: 0 10*3/uL (ref 0.0–0.5)
HCT: 23.2 % — ABNORMAL LOW (ref 38.4–49.9)
HGB: 7.8 g/dL — ABNORMAL LOW (ref 13.0–17.1)
LYMPH%: 57.4 % — AB (ref 14.0–49.0)
MCH: 22.2 pg — ABNORMAL LOW (ref 27.2–33.4)
MCHC: 33.6 g/dL (ref 32.0–36.0)
MCV: 66.1 fL — ABNORMAL LOW (ref 79.3–98.0)
MONO#: 0.2 10*3/uL (ref 0.1–0.9)
MONO%: 14.2 % — AB (ref 0.0–14.0)
NEUT%: 27.7 % — AB (ref 39.0–75.0)
NEUTROS ABS: 0.4 10*3/uL — AB (ref 1.5–6.5)
Platelets: 114 10*3/uL — ABNORMAL LOW (ref 140–400)
RBC: 3.51 10*6/uL — AB (ref 4.20–5.82)
RDW: 16.7 % — ABNORMAL HIGH (ref 11.0–14.6)
WBC: 1.4 10*3/uL — AB (ref 4.0–10.3)
lymph#: 0.8 10*3/uL — ABNORMAL LOW (ref 0.9–3.3)

## 2014-08-14 LAB — COMPREHENSIVE METABOLIC PANEL (CC13)
ALT: 89 U/L — AB (ref 0–55)
ANION GAP: 11 meq/L (ref 3–11)
AST: 52 U/L — ABNORMAL HIGH (ref 5–34)
Albumin: 3.8 g/dL (ref 3.5–5.0)
Alkaline Phosphatase: 128 U/L (ref 40–150)
BUN: 16.2 mg/dL (ref 7.0–26.0)
CO2: 25 meq/L (ref 22–29)
CREATININE: 1.7 mg/dL — AB (ref 0.7–1.3)
Calcium: 9.5 mg/dL (ref 8.4–10.4)
Chloride: 103 mEq/L (ref 98–109)
EGFR: 52 mL/min/{1.73_m2} — ABNORMAL LOW (ref >90)
Glucose: 105 mg/dl (ref 70–140)
Potassium: 4.2 mEq/L (ref 3.5–5.1)
SODIUM: 139 meq/L (ref 136–145)
Total Bilirubin: 0.29 mg/dL (ref 0.20–1.20)
Total Protein: 8 g/dL (ref 6.4–8.3)

## 2014-08-14 MED ORDER — TBO-FILGRASTIM 480 MCG/0.8ML ~~LOC~~ SOSY
480.0000 ug | PREFILLED_SYRINGE | Freq: Once | SUBCUTANEOUS | Status: AC
  Administered 2014-08-14: 480 ug via SUBCUTANEOUS
  Filled 2014-08-14: qty 0.8

## 2014-08-14 NOTE — Telephone Encounter (Signed)
S/w pt confirming injections for this week per 06/29 POF... kJ

## 2014-08-14 NOTE — Patient Instructions (Signed)

## 2014-08-15 ENCOUNTER — Ambulatory Visit (HOSPITAL_BASED_OUTPATIENT_CLINIC_OR_DEPARTMENT_OTHER): Payer: Medicare Other

## 2014-08-15 VITALS — BP 126/71 | HR 102 | Temp 98.2°F

## 2014-08-15 DIAGNOSIS — C7951 Secondary malignant neoplasm of bone: Secondary | ICD-10-CM | POA: Diagnosis not present

## 2014-08-15 DIAGNOSIS — C3490 Malignant neoplasm of unspecified part of unspecified bronchus or lung: Secondary | ICD-10-CM

## 2014-08-15 DIAGNOSIS — C787 Secondary malignant neoplasm of liver and intrahepatic bile duct: Secondary | ICD-10-CM | POA: Diagnosis not present

## 2014-08-15 DIAGNOSIS — C3432 Malignant neoplasm of lower lobe, left bronchus or lung: Secondary | ICD-10-CM | POA: Diagnosis present

## 2014-08-15 MED ORDER — TBO-FILGRASTIM 480 MCG/0.8ML ~~LOC~~ SOSY
480.0000 ug | PREFILLED_SYRINGE | Freq: Once | SUBCUTANEOUS | Status: AC
  Administered 2014-08-15: 480 ug via SUBCUTANEOUS
  Filled 2014-08-15: qty 0.8

## 2014-08-16 ENCOUNTER — Ambulatory Visit (HOSPITAL_BASED_OUTPATIENT_CLINIC_OR_DEPARTMENT_OTHER): Payer: Medicare Other

## 2014-08-16 VITALS — BP 112/69 | HR 95 | Temp 98.3°F

## 2014-08-16 DIAGNOSIS — C3432 Malignant neoplasm of lower lobe, left bronchus or lung: Secondary | ICD-10-CM

## 2014-08-16 DIAGNOSIS — C787 Secondary malignant neoplasm of liver and intrahepatic bile duct: Secondary | ICD-10-CM

## 2014-08-16 DIAGNOSIS — C7951 Secondary malignant neoplasm of bone: Secondary | ICD-10-CM

## 2014-08-16 DIAGNOSIS — C3490 Malignant neoplasm of unspecified part of unspecified bronchus or lung: Secondary | ICD-10-CM

## 2014-08-16 MED ORDER — TBO-FILGRASTIM 480 MCG/0.8ML ~~LOC~~ SOSY
480.0000 ug | PREFILLED_SYRINGE | Freq: Once | SUBCUTANEOUS | Status: AC
  Administered 2014-08-16: 480 ug via SUBCUTANEOUS
  Filled 2014-08-16: qty 0.8

## 2014-08-21 ENCOUNTER — Other Ambulatory Visit: Payer: Medicare Other

## 2014-08-26 ENCOUNTER — Ambulatory Visit (HOSPITAL_COMMUNITY)
Admission: RE | Admit: 2014-08-26 | Discharge: 2014-08-26 | Disposition: A | Discharge status: Home / Self Care | Payer: Medicare Other | Source: Ambulatory Visit | Attending: Nurse Practitioner | Admitting: Nurse Practitioner

## 2014-08-26 DIAGNOSIS — N400 Enlarged prostate without lower urinary tract symptoms: Secondary | ICD-10-CM | POA: Insufficient documentation

## 2014-08-26 DIAGNOSIS — C3490 Malignant neoplasm of unspecified part of unspecified bronchus or lung: Secondary | ICD-10-CM

## 2014-08-28 ENCOUNTER — Ambulatory Visit: Payer: Medicare Other | Admitting: Nutrition

## 2014-08-28 ENCOUNTER — Ambulatory Visit: Payer: Medicare Other

## 2014-08-28 ENCOUNTER — Other Ambulatory Visit (HOSPITAL_BASED_OUTPATIENT_CLINIC_OR_DEPARTMENT_OTHER): Payer: Medicare Other

## 2014-08-28 ENCOUNTER — Telehealth: Payer: Self-pay | Admitting: Internal Medicine

## 2014-08-28 ENCOUNTER — Other Ambulatory Visit: Payer: Self-pay | Admitting: Medical Oncology

## 2014-08-28 ENCOUNTER — Encounter: Payer: Self-pay | Admitting: Internal Medicine

## 2014-08-28 ENCOUNTER — Other Ambulatory Visit: Payer: Medicare Other

## 2014-08-28 ENCOUNTER — Ambulatory Visit (HOSPITAL_BASED_OUTPATIENT_CLINIC_OR_DEPARTMENT_OTHER): Payer: Medicare Other | Admitting: Internal Medicine

## 2014-08-28 ENCOUNTER — Ambulatory Visit (HOSPITAL_BASED_OUTPATIENT_CLINIC_OR_DEPARTMENT_OTHER): Payer: Medicare Other

## 2014-08-28 VITALS — BP 128/79 | HR 90 | Temp 98.6°F | Resp 18 | Ht 71.0 in | Wt 187.3 lb

## 2014-08-28 DIAGNOSIS — D6481 Anemia due to antineoplastic chemotherapy: Secondary | ICD-10-CM | POA: Insufficient documentation

## 2014-08-28 DIAGNOSIS — T451X5A Adverse effect of antineoplastic and immunosuppressive drugs, initial encounter: Secondary | ICD-10-CM

## 2014-08-28 DIAGNOSIS — I10 Essential (primary) hypertension: Secondary | ICD-10-CM | POA: Diagnosis not present

## 2014-08-28 DIAGNOSIS — C3432 Malignant neoplasm of lower lobe, left bronchus or lung: Secondary | ICD-10-CM

## 2014-08-28 DIAGNOSIS — C3492 Malignant neoplasm of unspecified part of left bronchus or lung: Secondary | ICD-10-CM

## 2014-08-28 DIAGNOSIS — C7951 Secondary malignant neoplasm of bone: Secondary | ICD-10-CM

## 2014-08-28 DIAGNOSIS — Z5111 Encounter for antineoplastic chemotherapy: Secondary | ICD-10-CM

## 2014-08-28 DIAGNOSIS — D6489 Other specified anemias: Secondary | ICD-10-CM

## 2014-08-28 DIAGNOSIS — C787 Secondary malignant neoplasm of liver and intrahepatic bile duct: Secondary | ICD-10-CM

## 2014-08-28 DIAGNOSIS — C3490 Malignant neoplasm of unspecified part of unspecified bronchus or lung: Secondary | ICD-10-CM

## 2014-08-28 LAB — COMPREHENSIVE METABOLIC PANEL (CC13)
ALT: 18 U/L (ref 0–55)
ANION GAP: 8 meq/L (ref 3–11)
AST: 20 U/L (ref 5–34)
Albumin: 3.5 g/dL (ref 3.5–5.0)
Alkaline Phosphatase: 119 U/L (ref 40–150)
BUN: 13.4 mg/dL (ref 7.0–26.0)
CO2: 25 meq/L (ref 22–29)
CREATININE: 1.6 mg/dL — AB (ref 0.7–1.3)
Calcium: 9.6 mg/dL (ref 8.4–10.4)
Chloride: 109 mEq/L (ref 98–109)
EGFR: 57 mL/min/{1.73_m2} — ABNORMAL LOW (ref >90)
GLUCOSE: 93 mg/dL (ref 70–140)
POTASSIUM: 4.4 meq/L (ref 3.5–5.1)
Sodium: 142 mEq/L (ref 136–145)
Total Bilirubin: 0.24 mg/dL (ref 0.20–1.20)
Total Protein: 7.6 g/dL (ref 6.4–8.3)

## 2014-08-28 LAB — CBC WITH DIFFERENTIAL/PLATELET
BASO%: 0.8 % (ref 0.0–2.0)
Basophils Absolute: 0 10*3/uL (ref 0.0–0.1)
EOS%: 0.9 % (ref 0.0–7.0)
Eosinophils Absolute: 0 10*3/uL (ref 0.0–0.5)
HEMATOCRIT: 24.5 % — AB (ref 38.4–49.9)
HEMOGLOBIN: 8 g/dL — AB (ref 13.0–17.1)
LYMPH%: 19 % (ref 14.0–49.0)
MCH: 23 pg — ABNORMAL LOW (ref 27.2–33.4)
MCHC: 32.4 g/dL (ref 32.0–36.0)
MCV: 70.9 fL — ABNORMAL LOW (ref 79.3–98.0)
MONO#: 0.6 10*3/uL (ref 0.1–0.9)
MONO%: 14.4 % — ABNORMAL HIGH (ref 0.0–14.0)
NEUT%: 64.9 % (ref 39.0–75.0)
NEUTROS ABS: 2.5 10*3/uL (ref 1.5–6.5)
PLATELETS: 443 10*3/uL — AB (ref 140–400)
RBC: 3.46 10*6/uL — ABNORMAL LOW (ref 4.20–5.82)
RDW: 22.9 % — ABNORMAL HIGH (ref 11.0–14.6)
WBC: 3.8 10*3/uL — ABNORMAL LOW (ref 4.0–10.3)
lymph#: 0.7 10*3/uL — ABNORMAL LOW (ref 0.9–3.3)

## 2014-08-28 MED ORDER — HYDROCODONE-ACETAMINOPHEN 5-325 MG PO TABS: 2.0000 | ORAL_TABLET | Freq: Four times a day (QID) | ORAL | Status: DC | PRN

## 2014-08-28 MED ORDER — GEMCITABINE HCL CHEMO INJECTION 1 GM/26.3ML
2000.0000 mg | Freq: Once | INTRAVENOUS | Status: AC
  Administered 2014-08-28: 2000 mg via INTRAVENOUS
  Filled 2014-08-28: qty 52.6

## 2014-08-28 MED ORDER — SODIUM CHLORIDE 0.9 % IV SOLN
Freq: Once | INTRAVENOUS | Status: AC
  Administered 2014-08-28: 10:00:00 via INTRAVENOUS
  Filled 2014-08-28: qty 8

## 2014-08-28 MED ORDER — SODIUM CHLORIDE 0.9 % IV SOLN
Freq: Once | INTRAVENOUS | Status: AC
  Administered 2014-08-28: 10:00:00 via INTRAVENOUS

## 2014-08-28 MED ORDER — SODIUM CHLORIDE 0.9 % IV SOLN
440.0000 mg | Freq: Once | INTRAVENOUS | Status: AC
  Administered 2014-08-28: 440 mg via INTRAVENOUS
  Filled 2014-08-28: qty 44

## 2014-08-28 NOTE — Patient Instructions (Signed)
[facility] Discharge Instructions for Patients Receiving Chemotherapy  Today you received the following chemotherapy agents:  Etoposide  To help prevent nausea and vomiting after your treatment, we encourage you to take your nausea medication.   If you develop nausea and vomiting that is not controlled by your nausea medication, call the clinic.   BELOW ARE SYMPTOMS THAT SHOULD BE REPORTED IMMEDIATELY:  *FEVER GREATER THAN 100.5 F  *CHILLS WITH OR WITHOUT FEVER  NAUSEA AND VOMITING THAT IS NOT CONTROLLED WITH YOUR NAUSEA MEDICATION  *UNUSUAL SHORTNESS OF BREATH  *UNUSUAL BRUISING OR BLEEDING  TENDERNESS IN MOUTH AND THROAT WITH OR WITHOUT PRESENCE OF ULCERS  *URINARY PROBLEMS  *BOWEL PROBLEMS  UNUSUAL RASH Items with * indicate a potential emergency and should be followed up as soon as possible.  Feel free to call the clinic you have any questions or concerns. The clinic phone number is [phone].  Please show the CHEMO ALERT CARD at check-in to the Emergency Department and triage nurse.   

## 2014-08-28 NOTE — Progress Notes (Signed)
I placed form [facility] on desk of nurse for dr. Mckinley Jewel

## 2014-08-28 NOTE — Progress Notes (Signed)
Hydrocodone refill given to pt.

## 2014-08-28 NOTE — Progress Notes (Signed)
I faxed form to [facility]

## 2014-08-28 NOTE — Progress Notes (Signed)
Nutrition follow-up completed with patient during chemotherapy for stage IV non-small cell lung cancer. Patient reports he feels well and has an increased appetite. Weight has also increased and was documented as 187.3 pounds July 13 increased from 184 pounds. Patient states nausea is controlled. He is drinking Ensure Plus 3 times a day.  Nutrition diagnosis: Inadequate oral intake resolved.   Educated patient to continue strategies for continue adequate intake of calories and protein. Provided additional coupons for patient today.   Educated him to contact me if he needs additional coupons in the future. Patient agrees to contact me for any questions.    **Disclaimer: This note was dictated with voice recognition software. Similar sounding words can inadvertently be transcribed and this note may contain transcription errors which may not have been corrected upon publication of note.**

## 2014-08-28 NOTE — Progress Notes (Signed)
[place] [phone]   [phone]  OFFICE PROGRESS NOTE  No PCP Per Patient No address on file  DIAGNOSIS: Stage IV (T2a, N3, M1b) non-small cell lung cancer, squamous cell carcinoma diagnosed in April 2016.  PRIOR THERAPY: None  CURRENT THERAPY: Systemic chemotherapy with carboplatin for AUC of 5 given on day 1 and gemcitabine 1000 MG/M2 on days 1 and 8 every 3 weeks. Status post 3 cycles.  INTERVAL HISTORY: Keith Holloway 54 y.o. male returns to the clinic today for follow-up visit accompanied by his wife and daughter. The patient is treating his current treatment with carboplatin and gemcitabine fairly well. He noticed improvement in his general condition after starting the chemotherapy. He denied having any significant chest pain but continues to have shortness breath with exertion with mild cough and no hemoptysis. He denied having any significant weight loss or night sweats. The patient denied having any fever or chills. He has no nausea or vomiting. He had repeat CT scan of the chest, abdomen and pelvis performed recently and he is here for evaluation and discussion of his scan results.  MEDICAL HISTORY: Past Medical History  Diagnosis Date  . Diabetes mellitus without complication   . Hypertension   . High cholesterol   . Migraine   . Depression   . Arthritis   . Alcohol abuse   . Polysubstance abuse     alcohol and cocaine  . Polyneuropathy in diabetes(357.2)   . Memory disorder 08/22/2013  . CHF (congestive heart failure)     ALLERGIES:  has No Known Allergies.  MEDICATIONS:  Current Outpatient Prescriptions  Medication Sig Dispense Refill  . benzonatate (TESSALON) 100 MG capsule Take 1 capsule (100 mg total) by mouth every 8 (eight) hours. 21 capsule 0  . HYDROcodone-acetaminophen (NORCO/VICODIN) 5-325 MG per tablet Take 2 tablets by mouth every 6 (six) hours as needed. 60 tablet 0  . insulin NPH-regular Human (NOVOLIN  70/30) (70-30) 100 UNIT/ML injection Inject 35 Units into the skin 2 (two) times daily with a meal. 10 mL 11  . lisinopril (PRINIVIL,ZESTRIL) 20 MG tablet Take 1 tablet (20 mg total) by mouth daily. For kidney protection 60 tablet 2  . metFORMIN (GLUCOPHAGE) 1000 MG tablet Take 1 tablet (1,000 mg total) by mouth 2 (two) times daily. 60 tablet 12  . prochlorperazine (COMPAZINE) 10 MG tablet Take 1 tablet (10 mg total) by mouth every 6 (six) hours as needed for nausea or vomiting. 30 tablet 0  . simvastatin (ZOCOR) 40 MG tablet Take 1 tablet (40 mg total) by mouth daily. 30 tablet 3   No current facility-administered medications for this visit.    SURGICAL HISTORY:  Past Surgical History  Procedure Laterality Date  . Eye surgery  04/2012    detatched retna, bilateral  . Eye surgery  08/2012  . Cataract extraction      bilaterally    REVIEW OF SYSTEMS:  Constitutional: positive for fatigue Eyes: negative Ears, nose, mouth, throat, and face: negative Respiratory: positive for dyspnea on exertion Cardiovascular: negative Gastrointestinal: negative Genitourinary:negative Integument/breast: negative Hematologic/lymphatic: negative Musculoskeletal:negative Neurological: negative Behavioral/Psych: negative Endocrine: negative Allergic/Immunologic: negative   PHYSICAL EXAMINATION: General appearance: alert, cooperative, fatigued and no distress Head: Normocephalic, without obvious abnormality, atraumatic Neck: no adenopathy, no JVD, supple, symmetrical, trachea midline and thyroid not enlarged, symmetric, no tenderness/mass/nodules Lymph nodes: Cervical, supraclavicular, and axillary nodes normal. Resp: clear to auscultation bilaterally Back: symmetric, no curvature. ROM normal. No CVA tenderness.  Cardio: regular rate and rhythm, S1, S2 normal, no murmur, click, rub or gallop GI: soft, non-tender; bowel sounds normal; no masses,  no organomegaly Extremities: extremities normal,  atraumatic, no cyanosis or edema Neurologic: Alert and oriented X 3, normal strength and tone. Normal symmetric reflexes. Normal coordination and gait  ECOG PERFORMANCE STATUS: 1 - Symptomatic but completely ambulatory  Blood pressure 128/79, pulse 90, temperature 98.6 F (37 C), temperature source Oral, resp. rate 18, height '5\' 11"'$  (1.803 m), weight 187 lb 4.8 oz (84.959 kg), SpO2 100 %.  LABORATORY DATA: Lab Results  Component Value Date   WBC 3.8* 08/28/2014   HGB 8.0* 08/28/2014   HCT 24.5* 08/28/2014   MCV 70.9* 08/28/2014   PLT 443* 08/28/2014      Chemistry      Component Value Date/Time   NA 139 08/14/2014 0812   NA 136 05/05/2014 2010   K 4.2 08/14/2014 0812   K 4.0 05/05/2014 2010   CL 102 05/05/2014 2010   CO2 25 08/14/2014 0812   CO2 28 05/05/2014 2010   BUN 16.2 08/14/2014 0812   BUN 14 05/05/2014 2010   CREATININE 1.7* 08/14/2014 0812   CREATININE 1.26 05/05/2014 2010   CREATININE 1.02 01/23/2014 1313      Component Value Date/Time   CALCIUM 9.5 08/14/2014 0812   CALCIUM 9.4 05/05/2014 2010   ALKPHOS 128 08/14/2014 0812   ALKPHOS 108 05/05/2014 2049   AST 52* 08/14/2014 0812   AST 20 05/05/2014 2049   ALT 89* 08/14/2014 0812   ALT 24 05/05/2014 2049   BILITOT 0.29 08/14/2014 0812   BILITOT 0.5 05/05/2014 2049       RADIOGRAPHIC STUDIES: Ct Abdomen Pelvis Wo Contrast  08/26/2014   CLINICAL DATA:  Restaging lung cancer, ongoing chemotherapy.  EXAM: CT CHEST, ABDOMEN AND PELVIS WITHOUT CONTRAST  TECHNIQUE: Multidetector CT imaging of the chest, abdomen and pelvis was performed following the standard protocol without IV contrast.  COMPARISON:  PET 05/29/2014 and CT chest abdomen pelvis 05/05/2014.  FINDINGS: CT CHEST FINDINGS  Mediastinum/Nodes: Mediastinal adenopathy measures up to 3.8 x 6.2 cm in the prevascular space, previously 5.9 x 8.3 cm. Hilar regions are difficult to definitively evaluate without IV contrast. No axillary adenopathy. Heart size  normal. Resolved pericardial effusion. Pre pericardiac lymph node is sub cm in short axis size.  Lungs/Pleura: Left lower lobe mass is no longer measurable. There is some soft tissue fullness in the left infrahilar region, not measurable without IV contrast. Interval decrease in size and number of multiple bilateral pulmonary nodules. Residual haziness is seen in the medial left upper lobe (series 4, image 23), at the site of dominant pulmonary nodule seen on the prior exam, measuring 1.3 x 2.0 cm at that time. Largest measurable nodule is located in the apical segment right upper lobe, measuring 5 mm (series 4, image 19), previously 7 mm. Scattered linear scarring and volume loss. No pleural fluid. Probable adherent debris in the upper trachea. Airway is otherwise unremarkable.  Musculoskeletal: No worrisome lytic or sclerotic lesions.  CT ABDOMEN AND PELVIS FINDINGS  Hepatobiliary: Liver and gallbladder are unremarkable. No biliary ductal dilatation.  Pancreas: Negative.  Spleen: Negative.  Adrenals/Urinary Tract: Adrenal glands and kidneys are unremarkable. Ureters are decompressed. Ventral wall of the bladder may be thickened.  Stomach/Bowel: Stomach and small bowel are unremarkable. Appendix is not well-visualized. Moderate amount of stool in the colon, suggesting constipation.  Vascular/Lymphatic: Atherosclerotic calcification of the arterial vasculature without abdominal aortic aneurysm. No  pathologically enlarged lymph nodes.  Reproductive: Prostate is mildly enlarged.  Other: No free fluid.  Mesenteries and peritoneum are unremarkable.  Musculoskeletal: Focal osseous mottling is seen in the left aspect of the inferior manubrium (series 2, image 16), corresponding to hypermetabolism on 05/29/2014. No new worrisome lytic or sclerotic lesions.  IMPRESSION: 1. Interval response to therapy as evidenced by near complete resolution of a previously seen left lower lobe mass, decrease in size of mediastinal  adenopathy and near complete resolution of pulmonary metastases. 2. Left manubrial metastasis appears stable. 3. Ventral wall of the bladder may be thickened, unchanged. 4. Mild prostate enlargement.   Electronically Signed   By: Lorin Picket M.D.   On: 08/26/2014 08:59   Ct Chest Wo Contrast  08/26/2014   CLINICAL DATA:  Restaging lung cancer, ongoing chemotherapy.  EXAM: CT CHEST, ABDOMEN AND PELVIS WITHOUT CONTRAST  TECHNIQUE: Multidetector CT imaging of the chest, abdomen and pelvis was performed following the standard protocol without IV contrast.  COMPARISON:  PET 05/29/2014 and CT chest abdomen pelvis 05/05/2014.  FINDINGS: CT CHEST FINDINGS  Mediastinum/Nodes: Mediastinal adenopathy measures up to 3.8 x 6.2 cm in the prevascular space, previously 5.9 x 8.3 cm. Hilar regions are difficult to definitively evaluate without IV contrast. No axillary adenopathy. Heart size normal. Resolved pericardial effusion. Pre pericardiac lymph node is sub cm in short axis size.  Lungs/Pleura: Left lower lobe mass is no longer measurable. There is some soft tissue fullness in the left infrahilar region, not measurable without IV contrast. Interval decrease in size and number of multiple bilateral pulmonary nodules. Residual haziness is seen in the medial left upper lobe (series 4, image 23), at the site of dominant pulmonary nodule seen on the prior exam, measuring 1.3 x 2.0 cm at that time. Largest measurable nodule is located in the apical segment right upper lobe, measuring 5 mm (series 4, image 19), previously 7 mm. Scattered linear scarring and volume loss. No pleural fluid. Probable adherent debris in the upper trachea. Airway is otherwise unremarkable.  Musculoskeletal: No worrisome lytic or sclerotic lesions.  CT ABDOMEN AND PELVIS FINDINGS  Hepatobiliary: Liver and gallbladder are unremarkable. No biliary ductal dilatation.  Pancreas: Negative.  Spleen: Negative.  Adrenals/Urinary Tract: Adrenal glands and  kidneys are unremarkable. Ureters are decompressed. Ventral wall of the bladder may be thickened.  Stomach/Bowel: Stomach and small bowel are unremarkable. Appendix is not well-visualized. Moderate amount of stool in the colon, suggesting constipation.  Vascular/Lymphatic: Atherosclerotic calcification of the arterial vasculature without abdominal aortic aneurysm. No pathologically enlarged lymph nodes.  Reproductive: Prostate is mildly enlarged.  Other: No free fluid.  Mesenteries and peritoneum are unremarkable.  Musculoskeletal: Focal osseous mottling is seen in the left aspect of the inferior manubrium (series 2, image 16), corresponding to hypermetabolism on 05/29/2014. No new worrisome lytic or sclerotic lesions.  IMPRESSION: 1. Interval response to therapy as evidenced by near complete resolution of a previously seen left lower lobe mass, decrease in size of mediastinal adenopathy and near complete resolution of pulmonary metastases. 2. Left manubrial metastasis appears stable. 3. Ventral wall of the bladder may be thickened, unchanged. 4. Mild prostate enlargement.   Electronically Signed   By: Lorin Picket M.D.   On: 08/26/2014 08:59    ASSESSMENT AND PLAN: This is a very pleasant 54 years old African-American male with a stage IV non-small cell lung cancer, squamous cell carcinoma was currently undergoing systemic chemotherapy with carboplatin and gemcitabine status post 3 cycles. The patient is  doing fine today and tolerating his treatment fairly well except for fatigue and shortness breath with exertion. The recent CT scan of the Chest, Abdomen and pelvis showed improvement of his disease after the first 3 cycles of the chemotherapy. I discussed the scan results with the patient and his family. I recommended for him to continue treatment with the same regimen. He will receive cycle #4 of carboplatin and gemcitabine today. The patient would come back for follow-up visit in 3 weeks for  reevaluation before starting cycle #5. For the chemotherapy-induced anemia, we will arrange for the patient to have 2 units of PRBCs transfusion later this week. He was advised to call immediately if he has any concerning symptoms in the interval. The patient voices understanding of current disease status and treatment options and is in agreement with the current care plan.  All questions were answered. The patient knows to call the clinic with any problems, questions or concerns. We can certainly see the patient much sooner if necessary.  I spent 15 minutes counseling the patient face to face. The total time spent in the appointment was 25 minutes.  Disclaimer: This note was dictated with voice recognition software. Similar sounding words can inadvertently be transcribed and may not be corrected upon review.

## 2014-08-28 NOTE — Progress Notes (Signed)
Per Julien Nordmann it is okay to treat pt today with chemo and today's labs.

## 2014-08-28 NOTE — Telephone Encounter (Signed)
sw pt and advised on July and Aug appt.Marland KitchenMarland KitchenMarland Kitchen

## 2014-08-29 ENCOUNTER — Ambulatory Visit (HOSPITAL_COMMUNITY)
Admission: RE | Admit: 2014-08-29 | Discharge: 2014-08-29 | Disposition: A | Discharge status: Home / Self Care | Payer: Medicare Other | Source: Ambulatory Visit | Attending: Internal Medicine | Admitting: Internal Medicine

## 2014-08-29 DIAGNOSIS — D6489 Other specified anemias: Secondary | ICD-10-CM | POA: Insufficient documentation

## 2014-08-30 ENCOUNTER — Ambulatory Visit (HOSPITAL_BASED_OUTPATIENT_CLINIC_OR_DEPARTMENT_OTHER): Payer: Medicare Other

## 2014-08-30 ENCOUNTER — Ambulatory Visit: Payer: Medicare Other

## 2014-08-30 ENCOUNTER — Other Ambulatory Visit: Payer: Self-pay | Admitting: Medical Oncology

## 2014-08-30 VITALS — BP 111/68 | HR 83 | Temp 98.8°F | Resp 24

## 2014-08-30 DIAGNOSIS — D6489 Other specified anemias: Secondary | ICD-10-CM

## 2014-08-30 LAB — PREPARE RBC (CROSSMATCH)

## 2014-08-30 LAB — ABO/RH: ABO/RH(D): A POS

## 2014-08-30 MED ORDER — ACETAMINOPHEN 325 MG PO TABS
650.0000 mg | ORAL_TABLET | Freq: Once | ORAL | Status: AC
  Administered 2014-08-30: 650 mg via ORAL

## 2014-08-30 MED ORDER — DIPHENHYDRAMINE HCL 25 MG PO CAPS
ORAL_CAPSULE | ORAL | Status: AC
Start: 2014-08-30 — End: 2014-08-30
  Filled 2014-08-30: qty 1

## 2014-08-30 MED ORDER — ACETAMINOPHEN 325 MG PO TABS
ORAL_TABLET | ORAL | Status: AC
  Filled 2014-08-30: qty 2

## 2014-08-30 MED ORDER — SODIUM CHLORIDE 0.9 % IV SOLN
250.0000 mL | Freq: Once | INTRAVENOUS | Status: AC
  Administered 2014-08-30: 250 mL via INTRAVENOUS

## 2014-08-30 MED ORDER — DIPHENHYDRAMINE HCL 25 MG PO CAPS
25.0000 mg | ORAL_CAPSULE | Freq: Once | ORAL | Status: AC
  Administered 2014-08-30: 25 mg via ORAL

## 2014-08-30 NOTE — Patient Instructions (Signed)

## 2014-09-01 LAB — TYPE AND SCREEN
ABO/RH(D): A POS
ANTIBODY SCREEN: NEGATIVE
UNIT DIVISION: 0
Unit division: 0

## 2014-09-04 ENCOUNTER — Other Ambulatory Visit (HOSPITAL_BASED_OUTPATIENT_CLINIC_OR_DEPARTMENT_OTHER): Payer: Medicare Other

## 2014-09-04 ENCOUNTER — Ambulatory Visit: Payer: Medicare Other

## 2014-09-04 ENCOUNTER — Other Ambulatory Visit: Payer: Medicare Other

## 2014-09-04 DIAGNOSIS — C3432 Malignant neoplasm of lower lobe, left bronchus or lung: Secondary | ICD-10-CM | POA: Diagnosis present

## 2014-09-04 DIAGNOSIS — C787 Secondary malignant neoplasm of liver and intrahepatic bile duct: Secondary | ICD-10-CM | POA: Diagnosis not present

## 2014-09-04 DIAGNOSIS — C3492 Malignant neoplasm of unspecified part of left bronchus or lung: Secondary | ICD-10-CM

## 2014-09-04 LAB — CBC WITH DIFFERENTIAL/PLATELET
BASO%: 2.5 % — ABNORMAL HIGH (ref 0.0–2.0)
Basophils Absolute: 0 10*3/uL (ref 0.0–0.1)
EOS ABS: 0 10*3/uL (ref 0.0–0.5)
EOS%: 1.9 % (ref 0.0–7.0)
HEMATOCRIT: 31.5 % — AB (ref 38.4–49.9)
HGB: 10.6 g/dL — ABNORMAL LOW (ref 13.0–17.1)
LYMPH%: 37.7 % (ref 14.0–49.0)
MCH: 24.7 pg — ABNORMAL LOW (ref 27.2–33.4)
MCHC: 33.7 g/dL (ref 32.0–36.0)
MCV: 73.3 fL — ABNORMAL LOW (ref 79.3–98.0)
MONO#: 0.1 10*3/uL (ref 0.1–0.9)
MONO%: 6.3 % (ref 0.0–14.0)
NEUT%: 51.6 % (ref 39.0–75.0)
NEUTROS ABS: 0.8 10*3/uL — AB (ref 1.5–6.5)
Platelets: 333 10*3/uL (ref 140–400)
RBC: 4.3 10*6/uL (ref 4.20–5.82)
WBC: 1.6 10*3/uL — ABNORMAL LOW (ref 4.0–10.3)
lymph#: 0.6 10*3/uL — ABNORMAL LOW (ref 0.9–3.3)

## 2014-09-04 LAB — COMPREHENSIVE METABOLIC PANEL (CC13)
ALT: 57 U/L — ABNORMAL HIGH (ref 0–55)
AST: 39 U/L — AB (ref 5–34)
Albumin: 3.7 g/dL (ref 3.5–5.0)
Alkaline Phosphatase: 120 U/L (ref 40–150)
Anion Gap: 8 mEq/L (ref 3–11)
BILIRUBIN TOTAL: 0.44 mg/dL (ref 0.20–1.20)
BUN: 19.6 mg/dL (ref 7.0–26.0)
CALCIUM: 9.8 mg/dL (ref 8.4–10.4)
CO2: 25 meq/L (ref 22–29)
CREATININE: 1.6 mg/dL — AB (ref 0.7–1.3)
Chloride: 105 mEq/L (ref 98–109)
EGFR: 58 mL/min/{1.73_m2} — AB (ref >90)
GLUCOSE: 127 mg/dL (ref 70–140)
Potassium: 4.6 mEq/L (ref 3.5–5.1)
Sodium: 138 mEq/L (ref 136–145)
Total Protein: 8.1 g/dL (ref 6.4–8.3)

## 2014-09-04 NOTE — Patient Instructions (Signed)
Neutropenia Neutropenia is a condition that occurs when the level of a certain type of white blood cell (neutrophil) in your body becomes lower than normal. Neutrophils are made in the bone marrow and fight infections. These cells protect against bacteria and viruses. The fewer neutrophils you have, and the longer your body remains without them, the greater your risk of getting a severe infection becomes. CAUSES  The cause of neutropenia may be hard to determine. However, it is usually due to 3 main problems:   Decreased production of neutrophils. This may be due to:  Certain medicines such as chemotherapy.  Genetic problems.  Cancer.  Radiation treatments.  Vitamin deficiency.  Some pesticides.  Increased destruction of neutrophils. This may be due to:  Overwhelming infections.  Hemolytic anemia. This is when the body destroys its own blood cells.  Chemotherapy.  Neutrophils moving to areas of the body where they cannot fight infections. This may be due to:  Dialysis procedures.  Conditions where the spleen becomes enlarged. Neutrophils are held in the spleen and are not available to the rest of the body.  Overwhelming infections. The neutrophils are held in the area of the infection and are not available to the rest of the body. SYMPTOMS  There are no specific symptoms of neutropenia. The lack of neutrophils can result in an infection, and an infection can cause various problems. DIAGNOSIS  Diagnosis is made by a blood test. A complete blood count is performed. The normal level of neutrophils in human blood differs with age and race. Infants have lower counts than older children and adults. African Americans have lower counts than Caucasians or Asians. The average adult level is 1500 cells/mm3 of blood. Neutrophil counts are interpreted as follows:  Greater than 1000 cells/mm3 gives normal protection against infection.  500 to 1000 cells/mm3 gives an increased risk for  infection.  200 to 500 cells/mm3 is a greater risk for severe infection.  Lower than 200 cells/mm3 is a marked risk of infection. This may require hospitalization and treatment with antibiotic medicines. TREATMENT  Treatment depends on the underlying cause, severity, and presence of infections or symptoms. It also depends on your health. Your caregiver will discuss the treatment plan with you. Mild cases are often easily treated and have a good outcome. Preventative measures may also be started to limit your risk of infections. Treatment can include:  Taking antibiotics.  Stopping medicines that are known to cause neutropenia.  Correcting nutritional deficiencies by eating green vegetables to supply folic acid and taking vitamin B supplements.  Stopping exposure to pesticides if your neutropenia is related to pesticide exposure.  Taking a blood growth factor called sargramostim, pegfilgrastim, or filgrastim if you are undergoing chemotherapy for cancer. This stimulates white blood cell production.  Removal of the spleen if you have Felty's syndrome and have repeated infections. HOME CARE INSTRUCTIONS   Follow your caregiver's instructions about when you need to have blood work done.  Wash your hands often. Make sure others who come in contact with you also wash their hands.  Wash raw fruits and vegetables before eating them. They can carry bacteria and fungi.  Avoid people with colds or spreadable (contagious) diseases (chickenpox, herpes zoster, influenza).  Avoid large crowds.  Avoid construction areas. The dust can release fungus into the air.  Be cautious around children in daycare or school environments.  Take care of your respiratory system by coughing and deep breathing.  Bathe daily.  Protect your skin from cuts and   burns.  Do not work in the garden or with flowers and plants.  Care for the mouth before and after meals by brushing with a soft toothbrush. If you have  mucositis, do not use mouthwash. Mouthwash contains alcohol and can dry out the mouth even more.  Clean the area between the genitals and the anus (perineal area) after urination and bowel movements. Women need to wipe from front to back.  Use a water soluble lubricant during sexual intercourse and practice good hygiene after. Do not have intercourse if you are severely neutropenic. Check with your caregiver for guidelines.  Exercise daily as tolerated.  Avoid people who were vaccinated with a live vaccine in the past 30 days. You should not receive live vaccines (polio, typhoid).  Do not provide direct care for pets. Avoid animal droppings. Do not clean litter boxes and bird cages.  Do not share food utensils.  Do not use tampons, enemas, or rectal suppositories unless directed by your caregiver.  Use an electric razor to remove hair.  Wash your hands after handling magazines, letters, and newspapers. SEEK IMMEDIATE MEDICAL CARE IF:   You have a fever.  You have chills or start to shake.  You feel nauseous or vomit.  You develop mouth sores.  You develop aches and pains.  You have redness and swelling around open wounds.  Your skin is warm to the touch.  You have pus coming from your wounds.  You develop swollen lymph nodes.  You feel weak or fatigued.  You develop red streaks on the skin. MAKE SURE YOU:  Understand these instructions.  Will watch your condition.  Will get help right away if you are not doing well or get worse. Document Released: 07/24/2001 Document Revised: 04/26/2011 Document Reviewed: 08/21/2010 Exit[employer] Patient Information 2015 [employer]. This information is not intended to replace advice given to you by your health care provider. Make sure you discuss any questions you have with your health care provider.  

## 2014-09-04 NOTE — Progress Notes (Signed)
Hold treatment today due to neutropenia per Dr. Julien Nordmann. Mask given, neutropenic precautions reviewed, patient and spouse verbalized understanding.

## 2014-09-10 ENCOUNTER — Other Ambulatory Visit: Payer: Self-pay | Admitting: *Deleted

## 2014-09-11 ENCOUNTER — Other Ambulatory Visit: Payer: Medicare Other

## 2014-09-18 ENCOUNTER — Ambulatory Visit (HOSPITAL_BASED_OUTPATIENT_CLINIC_OR_DEPARTMENT_OTHER): Payer: Medicare Other

## 2014-09-18 ENCOUNTER — Other Ambulatory Visit (HOSPITAL_BASED_OUTPATIENT_CLINIC_OR_DEPARTMENT_OTHER): Payer: Medicare Other

## 2014-09-18 ENCOUNTER — Telehealth: Payer: Self-pay | Admitting: *Deleted

## 2014-09-18 ENCOUNTER — Ambulatory Visit (HOSPITAL_BASED_OUTPATIENT_CLINIC_OR_DEPARTMENT_OTHER): Payer: Medicare Other | Admitting: Nurse Practitioner

## 2014-09-18 VITALS — BP 142/81 | HR 97 | Temp 98.6°F | Resp 18 | Ht 71.0 in | Wt 185.9 lb

## 2014-09-18 DIAGNOSIS — C3432 Malignant neoplasm of lower lobe, left bronchus or lung: Secondary | ICD-10-CM

## 2014-09-18 DIAGNOSIS — C3492 Malignant neoplasm of unspecified part of left bronchus or lung: Secondary | ICD-10-CM

## 2014-09-18 DIAGNOSIS — Z5111 Encounter for antineoplastic chemotherapy: Secondary | ICD-10-CM | POA: Diagnosis not present

## 2014-09-18 DIAGNOSIS — C787 Secondary malignant neoplasm of liver and intrahepatic bile duct: Secondary | ICD-10-CM

## 2014-09-18 DIAGNOSIS — C3490 Malignant neoplasm of unspecified part of unspecified bronchus or lung: Secondary | ICD-10-CM

## 2014-09-18 LAB — COMPREHENSIVE METABOLIC PANEL (CC13)
ALBUMIN: 3.8 g/dL (ref 3.5–5.0)
ALT: 21 U/L (ref 0–55)
ANION GAP: 9 meq/L (ref 3–11)
AST: 20 U/L (ref 5–34)
Alkaline Phosphatase: 120 U/L (ref 40–150)
BUN: 11 mg/dL (ref 7.0–26.0)
CO2: 26 meq/L (ref 22–29)
Calcium: 9.3 mg/dL (ref 8.4–10.4)
Chloride: 104 mEq/L (ref 98–109)
Creatinine: 1.6 mg/dL — ABNORMAL HIGH (ref 0.7–1.3)
EGFR: 55 mL/min/{1.73_m2} — AB (ref >90)
GLUCOSE: 237 mg/dL — AB (ref 70–140)
POTASSIUM: 4.5 meq/L (ref 3.5–5.1)
Sodium: 139 mEq/L (ref 136–145)
Total Bilirubin: 0.32 mg/dL (ref 0.20–1.20)
Total Protein: 7.8 g/dL (ref 6.4–8.3)

## 2014-09-18 LAB — CBC WITH DIFFERENTIAL/PLATELET
BASO%: 0.6 % (ref 0.0–2.0)
Basophils Absolute: 0 10*3/uL (ref 0.0–0.1)
EOS%: 0.5 % (ref 0.0–7.0)
Eosinophils Absolute: 0 10*3/uL (ref 0.0–0.5)
HCT: 31.5 % — ABNORMAL LOW (ref 38.4–49.9)
HEMOGLOBIN: 10.2 g/dL — AB (ref 13.0–17.1)
LYMPH%: 11.8 % — ABNORMAL LOW (ref 14.0–49.0)
MCH: 25 pg — AB (ref 27.2–33.4)
MCHC: 32.5 g/dL (ref 32.0–36.0)
MCV: 77 fL — ABNORMAL LOW (ref 79.3–98.0)
MONO#: 0.6 10*3/uL (ref 0.1–0.9)
MONO%: 13.2 % (ref 0.0–14.0)
NEUT#: 3.1 10*3/uL (ref 1.5–6.5)
NEUT%: 73.9 % (ref 39.0–75.0)
Platelets: 344 10*3/uL (ref 140–400)
RBC: 4.09 10*6/uL — AB (ref 4.20–5.82)
RDW: 23.3 % — ABNORMAL HIGH (ref 11.0–14.6)
WBC: 4.2 10*3/uL (ref 4.0–10.3)
lymph#: 0.5 10*3/uL — ABNORMAL LOW (ref 0.9–3.3)

## 2014-09-18 MED ORDER — SODIUM CHLORIDE 0.9 % IV SOLN
Freq: Once | INTRAVENOUS | Status: AC
  Administered 2014-09-18: 12:00:00 via INTRAVENOUS

## 2014-09-18 MED ORDER — SODIUM CHLORIDE 0.9 % IV SOLN
442.5000 mg | Freq: Once | INTRAVENOUS | Status: AC
  Administered 2014-09-18: 440 mg via INTRAVENOUS
  Filled 2014-09-18: qty 44

## 2014-09-18 MED ORDER — SODIUM CHLORIDE 0.9 % IV SOLN
2000.0000 mg | Freq: Once | INTRAVENOUS | Status: AC
  Administered 2014-09-18: 2000 mg via INTRAVENOUS
  Filled 2014-09-18: qty 52.6

## 2014-09-18 MED ORDER — SODIUM CHLORIDE 0.9 % IV SOLN
Freq: Once | INTRAVENOUS | Status: AC
  Administered 2014-09-18: 13:00:00 via INTRAVENOUS
  Filled 2014-09-18: qty 8

## 2014-09-18 NOTE — Progress Notes (Signed)
  [facility] OFFICE PROGRESS NOTE   DIAGNOSIS: Stage IV (T2a, N3, M1b) non-small cell lung cancer, squamous cell carcinoma diagnosed in April 2016.  PRIOR THERAPY: None  CURRENT THERAPY: Systemic chemotherapy with carboplatin for AUC of 5 given on day 1 and gemcitabine 1000 MG/M2 on days 1 and 8 every 3 weeks. Status post 4 cycles   INTERVAL HISTORY:   [name] returns as scheduled. He completed cycle 4 carboplatin/gemcitabine 08/28/2014. The day 8 treatment treatment was held due to neutropenia. He feels he is tolerating treatment well. He noted improvement in his energy level following the recent blood transfusion. No nausea or vomiting. No mouth sores. No diarrhea. No rash. No fever following treatment. No shortness of breath or cough.  Objective:  Vital signs in last 24 hours:  Blood pressure 142/81, pulse 97, temperature 98.6 F (37 C), temperature source Oral, resp. rate 18, height '5\' 11"'$  (1.803 m), weight 185 lb 14.4 oz (84.324 kg), SpO2 100 %.    HEENT: No thrush or ulcers. Resp: Lungs clear bilaterally. Cardio: Regular rate and rhythm. GI: Abdomen soft and nontender. No hepatomegaly. Vascular: No leg edema. Neuro: Alert and oriented.  Skin: No rash.    Lab Results:  Lab Results  Component Value Date   WBC 4.2 09/18/2014   HGB 10.2* 09/18/2014   HCT 31.5* 09/18/2014   MCV 77.0* 09/18/2014   PLT 344 09/18/2014   NEUTROABS 3.1 09/18/2014    Imaging:  No results found.  Medications: I have reviewed the patient's current medications.  Assessment/Plan: 1. Stage IV (T2a, N3, M1b) non-small cell lung cancer, squamous cell carcinoma; presented with a left infrahilar mass in addition to bulky mediastinal lymphadenopathy as well as bone and liver disease diagnosed in March 2016. Initiation of carboplatin AUC 5 on day 1 and gemcitabine 1000 g/m on days 1 and 8 every 3 weeks beginning 06/21/2014. Restaging CT evaluation after 3 cycles on 08/26/2014  showed near-complete resolution of a previously seen left lower lobe mass, decrease in size of mediastinal adenopathy and near-complete resolution of pulmonary metastases. Left manubrial metastasis appeared stable.   Disposition: Keith Holloway appears stable. He has completed 4 cycles of carboplatin/gemcitabine. Plan to proceed with cycle 5 today as scheduled. He will return in one week for the day 8 gemcitabine. He will return for a follow-up visit and cycle 6 in 3 weeks. He will contact the office in the interim with any problems.  Plan reviewed with Dr. Julien Nordmann.    [name] ANP/GNP-BC   09/18/2014  11:27 AM

## 2014-09-18 NOTE — Progress Notes (Signed)
Okay to treat with total bili 1.77 per Dr. Julien Nordmann.

## 2014-09-18 NOTE — Telephone Encounter (Signed)
Per staff message and POF I have scheduled appts. Advised scheduler of appts. JMW  

## 2014-09-18 NOTE — Patient Instructions (Signed)
[facility] Discharge Instructions for Patients Receiving Chemotherapy  Today you received the following chemotherapy agents: Gemzar, Carboplatin  To help prevent nausea and vomiting after your treatment, we encourage you to take your nausea medication as prescribed by your physician.   If you develop nausea and vomiting that is not controlled by your nausea medication, call the clinic.   BELOW ARE SYMPTOMS THAT SHOULD BE REPORTED IMMEDIATELY:  *FEVER GREATER THAN 100.5 F  *CHILLS WITH OR WITHOUT FEVER  NAUSEA AND VOMITING THAT IS NOT CONTROLLED WITH YOUR NAUSEA MEDICATION  *UNUSUAL SHORTNESS OF BREATH  *UNUSUAL BRUISING OR BLEEDING  TENDERNESS IN MOUTH AND THROAT WITH OR WITHOUT PRESENCE OF ULCERS  *URINARY PROBLEMS  *BOWEL PROBLEMS  UNUSUAL RASH Items with * indicate a potential emergency and should be followed up as soon as possible.  Feel free to call the clinic you have any questions or concerns. The clinic phone number is [phone].  Please show the [facility] at check-in to the Emergency Department and triage nurse.

## 2014-09-25 ENCOUNTER — Ambulatory Visit: Payer: Medicare Other

## 2014-09-25 ENCOUNTER — Other Ambulatory Visit (HOSPITAL_BASED_OUTPATIENT_CLINIC_OR_DEPARTMENT_OTHER): Payer: Medicare Other

## 2014-09-25 DIAGNOSIS — C3432 Malignant neoplasm of lower lobe, left bronchus or lung: Secondary | ICD-10-CM

## 2014-09-25 DIAGNOSIS — C3492 Malignant neoplasm of unspecified part of left bronchus or lung: Secondary | ICD-10-CM

## 2014-09-25 LAB — COMPREHENSIVE METABOLIC PANEL (CC13)
ALT: 56 U/L — ABNORMAL HIGH (ref 0–55)
AST: 32 U/L (ref 5–34)
Albumin: 4.1 g/dL (ref 3.5–5.0)
Alkaline Phosphatase: 116 U/L (ref 40–150)
Anion Gap: 8 mEq/L (ref 3–11)
BILIRUBIN TOTAL: 0.27 mg/dL (ref 0.20–1.20)
BUN: 17.3 mg/dL (ref 7.0–26.0)
CO2: 26 meq/L (ref 22–29)
CREATININE: 1.5 mg/dL — AB (ref 0.7–1.3)
Calcium: 9.6 mg/dL (ref 8.4–10.4)
Chloride: 105 mEq/L (ref 98–109)
EGFR: 60 mL/min/{1.73_m2} — ABNORMAL LOW (ref >90)
Glucose: 155 mg/dl — ABNORMAL HIGH (ref 70–140)
Potassium: 4.4 mEq/L (ref 3.5–5.1)
Sodium: 139 mEq/L (ref 136–145)
Total Protein: 8.6 g/dL — ABNORMAL HIGH (ref 6.4–8.3)

## 2014-09-25 LAB — CBC WITH DIFFERENTIAL/PLATELET
BASO%: 1.8 % (ref 0.0–2.0)
Basophils Absolute: 0 10*3/uL (ref 0.0–0.1)
EOS%: 1.1 % (ref 0.0–7.0)
Eosinophils Absolute: 0 10*3/uL (ref 0.0–0.5)
HEMATOCRIT: 33.1 % — AB (ref 38.4–49.9)
HGB: 10.8 g/dL — ABNORMAL LOW (ref 13.0–17.1)
LYMPH#: 0.7 10*3/uL — AB (ref 0.9–3.3)
LYMPH%: 42.8 % (ref 14.0–49.0)
MCH: 24.9 pg — ABNORMAL LOW (ref 27.2–33.4)
MCHC: 32.6 g/dL (ref 32.0–36.0)
MCV: 76.4 fL — ABNORMAL LOW (ref 79.3–98.0)
MONO#: 0.1 10*3/uL (ref 0.1–0.9)
MONO%: 8.4 % (ref 0.0–14.0)
NEUT%: 45.9 % (ref 39.0–75.0)
NEUTROS ABS: 0.8 10*3/uL — AB (ref 1.5–6.5)
Platelets: 224 10*3/uL (ref 140–400)
RBC: 4.34 10*6/uL (ref 4.20–5.82)
RDW: 21.2 % — ABNORMAL HIGH (ref 11.0–14.6)
WBC: 1.6 10*3/uL — AB (ref 4.0–10.3)

## 2014-09-25 NOTE — Progress Notes (Signed)
Pt presented to infusion room.  After review of labs, ANC found to be 0.8.  Per Dr. Julien Nordmann, treatment cancelled for today.  Pt and family member notified of lab value and treatment cancellation.  Neutropenic precautions discussed with pt and pt family and understanding verbalized.  Pt given extra masks to take with him should he need them.  Pt verbalized understanding to return for lab appt next week and infusion the following week as scheduled.

## 2014-10-02 ENCOUNTER — Other Ambulatory Visit (HOSPITAL_BASED_OUTPATIENT_CLINIC_OR_DEPARTMENT_OTHER): Payer: Medicare Other

## 2014-10-02 DIAGNOSIS — C3432 Malignant neoplasm of lower lobe, left bronchus or lung: Secondary | ICD-10-CM | POA: Diagnosis not present

## 2014-10-02 DIAGNOSIS — C3492 Malignant neoplasm of unspecified part of left bronchus or lung: Secondary | ICD-10-CM

## 2014-10-02 LAB — CBC WITH DIFFERENTIAL/PLATELET
BASO%: 0.2 % (ref 0.0–2.0)
BASOS ABS: 0 10*3/uL (ref 0.0–0.1)
EOS ABS: 0 10*3/uL (ref 0.0–0.5)
EOS%: 0.4 % (ref 0.0–7.0)
HCT: 29.7 % — ABNORMAL LOW (ref 38.4–49.9)
HGB: 10.1 g/dL — ABNORMAL LOW (ref 13.0–17.1)
LYMPH%: 19.6 % (ref 14.0–49.0)
MCH: 25.6 pg — AB (ref 27.2–33.4)
MCHC: 34 g/dL (ref 32.0–36.0)
MCV: 75.2 fL — ABNORMAL LOW (ref 79.3–98.0)
MONO#: 0.6 10*3/uL (ref 0.1–0.9)
MONO%: 13 % (ref 0.0–14.0)
NEUT#: 3 10*3/uL (ref 1.5–6.5)
NEUT%: 66.8 % (ref 39.0–75.0)
NRBC: 0 % (ref 0–0)
PLATELETS: 167 10*3/uL (ref 140–400)
RBC: 3.95 10*6/uL — AB (ref 4.20–5.82)
RDW: 19.8 % — ABNORMAL HIGH (ref 11.0–14.6)
WBC: 4.5 10*3/uL (ref 4.0–10.3)
lymph#: 0.9 10*3/uL (ref 0.9–3.3)

## 2014-10-02 LAB — COMPREHENSIVE METABOLIC PANEL (CC13)
ALT: 31 U/L (ref 0–55)
ANION GAP: 8 meq/L (ref 3–11)
AST: 20 U/L (ref 5–34)
Albumin: 3.8 g/dL (ref 3.5–5.0)
Alkaline Phosphatase: 114 U/L (ref 40–150)
BUN: 14.9 mg/dL (ref 7.0–26.0)
CO2: 25 meq/L (ref 22–29)
Calcium: 9.1 mg/dL (ref 8.4–10.4)
Chloride: 108 mEq/L (ref 98–109)
Creatinine: 1.8 mg/dL — ABNORMAL HIGH (ref 0.7–1.3)
EGFR: 48 mL/min/{1.73_m2} — AB (ref >90)
GLUCOSE: 127 mg/dL (ref 70–140)
POTASSIUM: 4.2 meq/L (ref 3.5–5.1)
SODIUM: 140 meq/L (ref 136–145)
TOTAL PROTEIN: 7.6 g/dL (ref 6.4–8.3)
Total Bilirubin: 0.22 mg/dL (ref 0.20–1.20)

## 2014-10-02 LAB — TECHNOLOGIST REVIEW

## 2014-10-07 ENCOUNTER — Encounter: Payer: Self-pay | Admitting: Adult Health

## 2014-10-09 ENCOUNTER — Encounter: Payer: Self-pay | Admitting: Internal Medicine

## 2014-10-09 ENCOUNTER — Ambulatory Visit (HOSPITAL_BASED_OUTPATIENT_CLINIC_OR_DEPARTMENT_OTHER): Payer: Medicare Other | Admitting: Internal Medicine

## 2014-10-09 ENCOUNTER — Other Ambulatory Visit (HOSPITAL_BASED_OUTPATIENT_CLINIC_OR_DEPARTMENT_OTHER): Payer: Medicare Other

## 2014-10-09 ENCOUNTER — Ambulatory Visit (HOSPITAL_BASED_OUTPATIENT_CLINIC_OR_DEPARTMENT_OTHER): Payer: Medicare Other

## 2014-10-09 ENCOUNTER — Telehealth: Payer: Self-pay | Admitting: Internal Medicine

## 2014-10-09 VITALS — BP 129/76 | HR 66 | Temp 97.6°F | Resp 17 | Ht 71.0 in | Wt 190.4 lb

## 2014-10-09 DIAGNOSIS — Z5111 Encounter for antineoplastic chemotherapy: Secondary | ICD-10-CM

## 2014-10-09 DIAGNOSIS — Z716 Tobacco abuse counseling: Secondary | ICD-10-CM | POA: Insufficient documentation

## 2014-10-09 DIAGNOSIS — C3492 Malignant neoplasm of unspecified part of left bronchus or lung: Secondary | ICD-10-CM

## 2014-10-09 DIAGNOSIS — C3432 Malignant neoplasm of lower lobe, left bronchus or lung: Secondary | ICD-10-CM | POA: Diagnosis present

## 2014-10-09 DIAGNOSIS — C3490 Malignant neoplasm of unspecified part of unspecified bronchus or lung: Secondary | ICD-10-CM

## 2014-10-09 DIAGNOSIS — Z720 Tobacco use: Secondary | ICD-10-CM

## 2014-10-09 DIAGNOSIS — R0602 Shortness of breath: Secondary | ICD-10-CM

## 2014-10-09 DIAGNOSIS — T451X5A Adverse effect of antineoplastic and immunosuppressive drugs, initial encounter: Secondary | ICD-10-CM

## 2014-10-09 DIAGNOSIS — R5383 Other fatigue: Secondary | ICD-10-CM | POA: Diagnosis not present

## 2014-10-09 DIAGNOSIS — D6481 Anemia due to antineoplastic chemotherapy: Secondary | ICD-10-CM

## 2014-10-09 HISTORY — DX: Tobacco abuse counseling: Z71.6

## 2014-10-09 LAB — CBC WITH DIFFERENTIAL/PLATELET
BASO%: 0.5 % (ref 0.0–2.0)
Basophils Absolute: 0 10*3/uL (ref 0.0–0.1)
EOS ABS: 0.1 10*3/uL (ref 0.0–0.5)
EOS%: 1.7 % (ref 0.0–7.0)
HEMATOCRIT: 34.6 % — AB (ref 38.4–49.9)
HEMOGLOBIN: 11.4 g/dL — AB (ref 13.0–17.1)
LYMPH#: 1.1 10*3/uL (ref 0.9–3.3)
LYMPH%: 26 % (ref 14.0–49.0)
MCH: 25.2 pg — ABNORMAL LOW (ref 27.2–33.4)
MCHC: 32.9 g/dL (ref 32.0–36.0)
MCV: 76.4 fL — AB (ref 79.3–98.0)
MONO#: 0.5 10*3/uL (ref 0.1–0.9)
MONO%: 11.2 % (ref 0.0–14.0)
NEUT#: 2.6 10*3/uL (ref 1.5–6.5)
NEUT%: 60.6 % (ref 39.0–75.0)
Platelets: 271 10*3/uL (ref 140–400)
RBC: 4.53 10*6/uL (ref 4.20–5.82)
RDW: 18.6 % — AB (ref 11.0–14.6)
WBC: 4.2 10*3/uL (ref 4.0–10.3)

## 2014-10-09 LAB — COMPREHENSIVE METABOLIC PANEL (CC13)
ALBUMIN: 4.1 g/dL (ref 3.5–5.0)
ALK PHOS: 135 U/L (ref 40–150)
ALT: 27 U/L (ref 0–55)
AST: 23 U/L (ref 5–34)
Anion Gap: 11 mEq/L (ref 3–11)
BUN: 16.8 mg/dL (ref 7.0–26.0)
CO2: 24 mEq/L (ref 22–29)
CREATININE: 1.5 mg/dL — AB (ref 0.7–1.3)
Calcium: 9.6 mg/dL (ref 8.4–10.4)
Chloride: 107 mEq/L (ref 98–109)
EGFR: 60 mL/min/{1.73_m2} — ABNORMAL LOW (ref >90)
GLUCOSE: 140 mg/dL (ref 70–140)
Potassium: 4.2 mEq/L (ref 3.5–5.1)
SODIUM: 141 meq/L (ref 136–145)
TOTAL PROTEIN: 8.5 g/dL — AB (ref 6.4–8.3)
Total Bilirubin: 0.31 mg/dL (ref 0.20–1.20)

## 2014-10-09 MED ORDER — SODIUM CHLORIDE 0.9 % IV SOLN
Freq: Once | INTRAVENOUS | Status: AC
  Administered 2014-10-09: 10:00:00 via INTRAVENOUS
  Filled 2014-10-09: qty 8

## 2014-10-09 MED ORDER — SODIUM CHLORIDE 0.9 % IV SOLN
2000.0000 mg | Freq: Once | INTRAVENOUS | Status: AC
  Administered 2014-10-09: 2000 mg via INTRAVENOUS
  Filled 2014-10-09: qty 52.6

## 2014-10-09 MED ORDER — SODIUM CHLORIDE 0.9 % IV SOLN: 407.5000 mg | Freq: Once | INTRAVENOUS | Status: DC

## 2014-10-09 MED ORDER — NICOTINE 14 MG/24HR TD PT24: 14.0000 mg | MEDICATED_PATCH | Freq: Every day | TRANSDERMAL | Status: DC

## 2014-10-09 MED ORDER — SODIUM CHLORIDE 0.9 % IV SOLN
440.0000 mg | Freq: Once | INTRAVENOUS | Status: AC
  Administered 2014-10-09: 440 mg via INTRAVENOUS
  Filled 2014-10-09: qty 44

## 2014-10-09 MED ORDER — SODIUM CHLORIDE 0.9 % IV SOLN
Freq: Once | INTRAVENOUS | Status: AC
  Administered 2014-10-09: 10:00:00 via INTRAVENOUS

## 2014-10-09 NOTE — Progress Notes (Signed)
[facility] [phone]   [phone]  OFFICE PROGRESS NOTE  No PCP Per Patient No address on file  DIAGNOSIS: Stage IV (T2a, N3, M1b) non-small cell lung cancer, squamous cell carcinoma diagnosed in April 2016.  PRIOR THERAPY: None  CURRENT THERAPY: Systemic chemotherapy with carboplatin for AUC of 5 given on day 1 and gemcitabine 1000 MG/M2 on days 1 and 8 every 3 weeks. Status post 5 cycles.  CODE STATUS: Full code initially but no prolonged resuscitation    INTERVAL HISTORY: Keith Holloway 54 y.o. male returns to the clinic today for follow-up visit accompanied by his daughter. The patient is treating his current treatment with carboplatin and gemcitabine fairly well. He is status post 5 cycles. He is gaining weight. He denied having any significant chest pain but continues to have shortness breath with exertion with mild cough and no hemoptysis. He denied having any significant weight loss or night sweats. The patient denied having any fever or chills. He has no nausea or vomiting. Unfortunately he continues to smoke and he is trying to quit. He is here today to start cycle #6 of his treatment.  MEDICAL HISTORY: Past Medical History  Diagnosis Date  . Diabetes mellitus without complication   . Hypertension   . High cholesterol   . Migraine   . Depression   . Arthritis   . Alcohol abuse   . Polysubstance abuse     alcohol and cocaine  . Polyneuropathy in diabetes(357.2)   . Memory disorder 08/22/2013  . CHF (congestive heart failure)   . Tobacco abuse counseling 10/09/2014    ALLERGIES:  has No Known Allergies.  MEDICATIONS:  Current Outpatient Prescriptions  Medication Sig Dispense Refill  . insulin NPH-regular Human (NOVOLIN 70/30) (70-30) 100 UNIT/ML injection Inject 35 Units into the skin 2 (two) times daily with a meal. 10 mL 11  . lisinopril (PRINIVIL,ZESTRIL) 20 MG tablet Take 1 tablet (20 mg total) by mouth daily. For  kidney protection 60 tablet 2  . metFORMIN (GLUCOPHAGE) 1000 MG tablet Take 1 tablet (1,000 mg total) by mouth 2 (two) times daily. 60 tablet 12  . simvastatin (ZOCOR) 40 MG tablet Take 1 tablet (40 mg total) by mouth daily. 30 tablet 3  . nicotine (NICODERM CQ) 14 mg/24hr patch Place 1 patch (14 mg total) onto the skin daily. 28 patch 0  . prochlorperazine (COMPAZINE) 10 MG tablet Take 1 tablet (10 mg total) by mouth every 6 (six) hours as needed for nausea or vomiting. (Patient not taking: Reported on 10/09/2014) 30 tablet 0   No current facility-administered medications for this visit.    SURGICAL HISTORY:  Past Surgical History  Procedure Laterality Date  . Eye surgery  04/2012    detatched retna, bilateral  . Eye surgery  08/2012  . Cataract extraction      bilaterally    REVIEW OF SYSTEMS:  Constitutional: positive for fatigue Eyes: negative Ears, nose, mouth, throat, and face: negative Respiratory: positive for dyspnea on exertion Cardiovascular: negative Gastrointestinal: negative Genitourinary:negative Integument/breast: negative Hematologic/lymphatic: negative Musculoskeletal:negative Neurological: negative Behavioral/Psych: negative Endocrine: negative Allergic/Immunologic: negative   PHYSICAL EXAMINATION: General appearance: alert, cooperative, fatigued and no distress Head: Normocephalic, without obvious abnormality, atraumatic Neck: no adenopathy, no JVD, supple, symmetrical, trachea midline and thyroid not enlarged, symmetric, no tenderness/mass/nodules Lymph nodes: Cervical, supraclavicular, and axillary nodes normal. Resp: clear to auscultation bilaterally Back: symmetric, no curvature. ROM normal. No CVA tenderness. Cardio: regular rate and rhythm,  S1, S2 normal, no murmur, click, rub or gallop GI: soft, non-tender; bowel sounds normal; no masses,  no organomegaly Extremities: extremities normal, atraumatic, no cyanosis or edema Neurologic: Alert and  oriented X 3, normal strength and tone. Normal symmetric reflexes. Normal coordination and gait  ECOG PERFORMANCE STATUS: 1 - Symptomatic but completely ambulatory  Blood pressure 129/76, pulse 66, temperature 97.6 F (36.4 C), temperature source Oral, resp. rate 17, height '5\' 11"'$  (1.803 m), weight 190 lb 6.4 oz (86.365 kg), SpO2 100 %.  LABORATORY DATA: Lab Results  Component Value Date   WBC 4.2 10/09/2014   HGB 11.4* 10/09/2014   HCT 34.6* 10/09/2014   MCV 76.4* 10/09/2014   PLT 271 10/09/2014      Chemistry      Component Value Date/Time   NA 140 10/02/2014 0814   NA 136 05/05/2014 2010   K 4.2 10/02/2014 0814   K 4.0 05/05/2014 2010   CL 102 05/05/2014 2010   CO2 25 10/02/2014 0814   CO2 28 05/05/2014 2010   BUN 14.9 10/02/2014 0814   BUN 14 05/05/2014 2010   CREATININE 1.8* 10/02/2014 0814   CREATININE 1.26 05/05/2014 2010   CREATININE 1.02 01/23/2014 1313      Component Value Date/Time   CALCIUM 9.1 10/02/2014 0814   CALCIUM 9.4 05/05/2014 2010   ALKPHOS 114 10/02/2014 0814   ALKPHOS 108 05/05/2014 2049   AST 20 10/02/2014 0814   AST 20 05/05/2014 2049   ALT 31 10/02/2014 0814   ALT 24 05/05/2014 2049   BILITOT 0.22 10/02/2014 0814   BILITOT 0.5 05/05/2014 2049       RADIOGRAPHIC STUDIES: No results found.  ASSESSMENT AND PLAN: This is a very pleasant 54 years old African-American male with a stage IV non-small cell lung cancer, squamous cell carcinoma was currently undergoing systemic chemotherapy with carboplatin and gemcitabine status post 5 cycles. The patient is doing fine today and tolerating his treatment fairly well except for fatigue and shortness breath with exertion. I recommended for the patient to proceed with cycle #6 today as scheduled. He would come back for follow-up visit in one month after repeating CT scan of the chest, abdomen and pelvis for restaging of his disease. For smoke cessation, I started the patient on the Cardura and 14 mg/24  hour. CODE STATUS was discussed with the patient and he would like to continue with full code but no prolonged resuscitation. He was advised to call immediately if he has any concerning symptoms in the interval. The patient voices understanding of current disease status and treatment options and is in agreement with the current care plan.  All questions were answered. The patient knows to call the clinic with any problems, questions or concerns. We can certainly see the patient much sooner if necessary.    Disclaimer: This note was dictated with voice recognition software. Similar sounding words can inadvertently be transcribed and may not be corrected upon review.

## 2014-10-09 NOTE — Telephone Encounter (Signed)
per pof to sch pt appt-gave pt copy of avs °

## 2014-10-09 NOTE — Telephone Encounter (Signed)
perpof to sch pt appt-gave pt contrast-adv [facility] will call to sch pt scan-gave avs

## 2014-10-09 NOTE — Patient Instructions (Signed)
You Can Quit Smoking If you are ready to quit smoking or are thinking about it, congratulations! You have chosen to help yourself be healthier and live longer! There are lots of different ways to quit smoking. Nicotine gum, nicotine patches, a nicotine inhaler, or nicotine nasal spray can help with physical craving. Hypnosis, support groups, and medicines help break the habit of smoking. TIPS TO GET OFF AND STAY OFF CIGARETTES  Learn to predict your moods. Do not let a bad situation be your excuse to have a cigarette. Some situations in your life might tempt you to have a cigarette.  Ask friends and co-workers not to smoke around you.  Make your home smoke-free.  Never have "just one" cigarette. It leads to wanting another and another. Remind yourself of your decision to quit.  On a card, make a list of your reasons for not smoking. Read it at least the same number of times a day as you have a cigarette. Tell yourself everyday, "I do not want to smoke. I choose not to smoke."  Ask someone at home or work to help you with your plan to quit smoking.  Have something planned after you eat or have a cup of coffee. Take a walk or get other exercise to perk you up. This will help to keep you from overeating.  Try a relaxation exercise to calm you down and decrease your stress. Remember, you may be tense and nervous the first two weeks after you quit. This will pass.  Find new activities to keep your hands busy. Play with a pen, coin, or rubber band. Doodle or draw things on paper.  Brush your teeth right after eating. This will help cut down the craving for the taste of tobacco after meals. You can try mouthwash too.  Try gum, breath mints, or diet candy to keep something in your mouth. IF YOU SMOKE AND WANT TO QUIT:  Do not stock up on cigarettes. Never buy a carton. Wait until one pack is finished before you buy another.  Never carry cigarettes with you at work or at home.  Keep cigarettes  as far away from you as possible. Leave them with someone else.  Never carry matches or a lighter with you.  Ask yourself, "Do I need this cigarette or is this just a reflex?"  Bet with someone that you can quit. Put cigarette money in a piggy bank every morning. If you smoke, you give up the money. If you do not smoke, by the end of the week, you keep the money.  Keep trying. It takes 21 days to change a habit!  Talk to your doctor about using medicines to help you quit. These include nicotine replacement gum, lozenges, or skin patches. Document Released: 11/28/2008 Document Revised: 04/26/2011 Document Reviewed: 11/28/2008 ExitCare Patient Information 2015 [employer]. This information is not intended to replace advice given to you by your health care provider. Make sure you discuss any questions you have with your health care provider.  

## 2014-10-09 NOTE — Patient Instructions (Signed)
[facility] Discharge Instructions for Patients Receiving Chemotherapy  Today you received the following chemotherapy agents: Gemzar, Carboplatin  To help prevent nausea and vomiting after your treatment, we encourage you to take your nausea medication as prescribed by your physician.   If you develop nausea and vomiting that is not controlled by your nausea medication, call the clinic.   BELOW ARE SYMPTOMS THAT SHOULD BE REPORTED IMMEDIATELY:  *FEVER GREATER THAN 100.5 F  *CHILLS WITH OR WITHOUT FEVER  NAUSEA AND VOMITING THAT IS NOT CONTROLLED WITH YOUR NAUSEA MEDICATION  *UNUSUAL SHORTNESS OF BREATH  *UNUSUAL BRUISING OR BLEEDING  TENDERNESS IN MOUTH AND THROAT WITH OR WITHOUT PRESENCE OF ULCERS  *URINARY PROBLEMS  *BOWEL PROBLEMS  UNUSUAL RASH Items with * indicate a potential emergency and should be followed up as soon as possible.  Feel free to call the clinic you have any questions or concerns. The clinic phone number is [phone].  Please show the [facility] at check-in to the Emergency Department and triage nurse.

## 2014-10-16 ENCOUNTER — Encounter: Payer: Self-pay | Admitting: Physician Assistant

## 2014-10-16 ENCOUNTER — Ambulatory Visit (HOSPITAL_BASED_OUTPATIENT_CLINIC_OR_DEPARTMENT_OTHER): Payer: Medicare Other

## 2014-10-16 ENCOUNTER — Other Ambulatory Visit (HOSPITAL_BASED_OUTPATIENT_CLINIC_OR_DEPARTMENT_OTHER): Payer: Medicare Other

## 2014-10-16 ENCOUNTER — Ambulatory Visit (HOSPITAL_BASED_OUTPATIENT_CLINIC_OR_DEPARTMENT_OTHER): Payer: Medicare Other | Admitting: Physician Assistant

## 2014-10-16 VITALS — BP 137/78 | HR 75 | Temp 98.3°F | Resp 18 | Ht 71.0 in | Wt 190.1 lb

## 2014-10-16 DIAGNOSIS — C3432 Malignant neoplasm of lower lobe, left bronchus or lung: Secondary | ICD-10-CM

## 2014-10-16 DIAGNOSIS — R0602 Shortness of breath: Secondary | ICD-10-CM | POA: Diagnosis not present

## 2014-10-16 DIAGNOSIS — C3492 Malignant neoplasm of unspecified part of left bronchus or lung: Secondary | ICD-10-CM

## 2014-10-16 DIAGNOSIS — Z5111 Encounter for antineoplastic chemotherapy: Secondary | ICD-10-CM

## 2014-10-16 DIAGNOSIS — C3491 Malignant neoplasm of unspecified part of right bronchus or lung: Secondary | ICD-10-CM

## 2014-10-16 DIAGNOSIS — R5383 Other fatigue: Secondary | ICD-10-CM

## 2014-10-16 LAB — CBC WITH DIFFERENTIAL/PLATELET
BASO%: 1.3 % (ref 0.0–2.0)
Basophils Absolute: 0 10*3/uL (ref 0.0–0.1)
EOS%: 1.7 % (ref 0.0–7.0)
Eosinophils Absolute: 0 10*3/uL (ref 0.0–0.5)
HCT: 32.6 % — ABNORMAL LOW (ref 38.4–49.9)
HGB: 10.5 g/dL — ABNORMAL LOW (ref 13.0–17.1)
LYMPH%: 36.2 % (ref 14.0–49.0)
MCH: 25.1 pg — ABNORMAL LOW (ref 27.2–33.4)
MCHC: 32.2 g/dL (ref 32.0–36.0)
MCV: 78 fL — ABNORMAL LOW (ref 79.3–98.0)
MONO#: 0.1 10*3/uL (ref 0.1–0.9)
MONO%: 5.8 % (ref 0.0–14.0)
NEUT%: 55 % (ref 39.0–75.0)
NEUTROS ABS: 1.1 10*3/uL — AB (ref 1.5–6.5)
PLATELETS: 163 10*3/uL (ref 140–400)
RBC: 4.18 10*6/uL — AB (ref 4.20–5.82)
RDW: 17.7 % — ABNORMAL HIGH (ref 11.0–14.6)
WBC: 2.1 10*3/uL — ABNORMAL LOW (ref 4.0–10.3)
lymph#: 0.7 10*3/uL — ABNORMAL LOW (ref 0.9–3.3)

## 2014-10-16 LAB — COMPREHENSIVE METABOLIC PANEL (CC13)
ALT: 48 U/L (ref 0–55)
AST: 33 U/L (ref 5–34)
Albumin: 4.1 g/dL (ref 3.5–5.0)
Alkaline Phosphatase: 125 U/L (ref 40–150)
Anion Gap: 9 mEq/L (ref 3–11)
BILIRUBIN TOTAL: 0.32 mg/dL (ref 0.20–1.20)
BUN: 15.9 mg/dL (ref 7.0–26.0)
CO2: 24 mEq/L (ref 22–29)
Calcium: 9.8 mg/dL (ref 8.4–10.4)
Chloride: 105 mEq/L (ref 98–109)
Creatinine: 1.6 mg/dL — ABNORMAL HIGH (ref 0.7–1.3)
EGFR: 54 mL/min/{1.73_m2} — ABNORMAL LOW (ref >90)
GLUCOSE: 141 mg/dL — AB (ref 70–140)
Potassium: 4.9 mEq/L (ref 3.5–5.1)
SODIUM: 139 meq/L (ref 136–145)
TOTAL PROTEIN: 8.5 g/dL — AB (ref 6.4–8.3)

## 2014-10-16 MED ORDER — PROCHLORPERAZINE MALEATE 10 MG PO TABS
10.0000 mg | ORAL_TABLET | Freq: Once | ORAL | Status: AC
  Administered 2014-10-16: 10 mg via ORAL

## 2014-10-16 MED ORDER — SODIUM CHLORIDE 0.9 % IV SOLN
Freq: Once | INTRAVENOUS | Status: AC
  Administered 2014-10-16: 10:00:00 via INTRAVENOUS

## 2014-10-16 MED ORDER — GEMCITABINE HCL CHEMO INJECTION 1 GM/26.3ML
2000.0000 mg | Freq: Once | INTRAVENOUS | Status: AC
  Administered 2014-10-16: 2000 mg via INTRAVENOUS
  Filled 2014-10-16: qty 53

## 2014-10-16 MED ORDER — PROCHLORPERAZINE MALEATE 10 MG PO TABS
ORAL_TABLET | ORAL | Status: AC
  Filled 2014-10-16: qty 1

## 2014-10-16 NOTE — Patient Instructions (Signed)
[facility] Discharge Instructions for Patients Receiving Chemotherapy  Today you received the following chemotherapy agents: Gemzar.  To help prevent nausea and vomiting after your treatment, we encourage you to take your nausea medication: Compazine 10 mg every 6 hours as needed.   If you develop nausea and vomiting that is not controlled by your nausea medication, call the clinic.   BELOW ARE SYMPTOMS THAT SHOULD BE REPORTED IMMEDIATELY:  *FEVER GREATER THAN 100.5 F  *CHILLS WITH OR WITHOUT FEVER  NAUSEA AND VOMITING THAT IS NOT CONTROLLED WITH YOUR NAUSEA MEDICATION  *UNUSUAL SHORTNESS OF BREATH  *UNUSUAL BRUISING OR BLEEDING  TENDERNESS IN MOUTH AND THROAT WITH OR WITHOUT PRESENCE OF ULCERS  *URINARY PROBLEMS  *BOWEL PROBLEMS  UNUSUAL RASH Items with * indicate a potential emergency and should be followed up as soon as possible.  Feel free to call the clinic you have any questions or concerns. The clinic phone number is [phone].  Please show the [facility] at check-in to the Emergency Department and triage nurse.

## 2014-10-16 NOTE — Progress Notes (Addendum)
[facility] [phone]   Fax:(336) [phone]  OFFICE PROGRESS NOTE  No PCP Per Patient No address on file  DIAGNOSIS: Stage IV (T2a, N3, M1b) non-small cell lung cancer, squamous cell carcinoma diagnosed in April 2016.  PRIOR THERAPY: None  CURRENT THERAPY: Systemic chemotherapy with carboplatin for AUC of 5 given on day 1 and gemcitabine 1000 MG/M2 on days 1 and 8 every 3 weeks. Status post 5 cycles and day 1 of cycle #6  CODE STATUS: Full code initially but no prolonged resuscitation    INTERVAL HISTORY: Keith Holloway 54 y.o. male returns to the clinic today for follow-up visit accompanied by his daughter. The patient is treating his current treatment with carboplatin and gemcitabine fairly well. He is status post 5 cycles, and day 1 of cycle #6. He is gaining weight. He denied having any significant chest pain but continues to have shortness breath with exertion with mild cough and no hemoptysis. He denied having any significant weight loss or night sweats. The patient denied having any fever or chills. He has no nausea or vomiting. He is tolerating his chemotherapy relatively well with the exception of occasional nausea and loose stools. He requests a prescription for Ensure so that he can get it from [facility].  MEDICAL HISTORY: Past Medical History  Diagnosis Date  . Diabetes mellitus without complication   . Hypertension   . High cholesterol   . Migraine   . Depression   . Arthritis   . Alcohol abuse   . Polysubstance abuse     alcohol and cocaine  . Polyneuropathy in diabetes(357.2)   . Memory disorder 08/22/2013  . CHF (congestive heart failure)   . Tobacco abuse counseling 10/09/2014    ALLERGIES:  has No Known Allergies.  MEDICATIONS:  Current Outpatient Prescriptions  Medication Sig Dispense Refill  . insulin NPH-regular Human (NOVOLIN 70/30) (70-30) 100 UNIT/ML injection Inject 35 Units into the skin 2 (two) times  daily with a meal. 10 mL 11  . lisinopril (PRINIVIL,ZESTRIL) 20 MG tablet Take 1 tablet (20 mg total) by mouth daily. For kidney protection 60 tablet 2  . metFORMIN (GLUCOPHAGE) 1000 MG tablet Take 1 tablet (1,000 mg total) by mouth 2 (two) times daily. 60 tablet 12  . nicotine (NICODERM CQ) 14 mg/24hr patch Place 1 patch (14 mg total) onto the skin daily. 28 patch 0  . prochlorperazine (COMPAZINE) 10 MG tablet Take 1 tablet (10 mg total) by mouth every 6 (six) hours as needed for nausea or vomiting. (Patient not taking: Reported on 10/09/2014) 30 tablet 0  . simvastatin (ZOCOR) 40 MG tablet Take 1 tablet (40 mg total) by mouth daily. 30 tablet 3   No current facility-administered medications for this visit.    SURGICAL HISTORY:  Past Surgical History  Procedure Laterality Date  . Eye surgery  04/2012    detatched retna, bilateral  . Eye surgery  08/2012  . Cataract extraction      bilaterally    REVIEW OF SYSTEMS:  Constitutional: positive for fatigue Eyes: negative Ears, nose, mouth, throat, and face: negative Respiratory: positive for dyspnea on exertion Cardiovascular: negative Gastrointestinal: negative Genitourinary:negative Integument/breast: negative Hematologic/lymphatic: negative Musculoskeletal:negative Neurological: negative Behavioral/Psych: negative Endocrine: negative Allergic/Immunologic: negative   PHYSICAL EXAMINATION: General appearance: alert, cooperative, fatigued and no distress Head: Normocephalic, without obvious abnormality, atraumatic Neck: no adenopathy, no JVD, supple, symmetrical, trachea midline and thyroid not enlarged, symmetric, no tenderness/mass/nodules Lymph nodes: Cervical, supraclavicular, and  axillary nodes normal. Resp: clear to auscultation bilaterally Back: symmetric, no curvature. ROM normal. No CVA tenderness. Cardio: regular rate and rhythm, S1, S2 normal, no murmur, click, rub or gallop GI: soft, non-tender; bowel sounds normal;  no masses,  no organomegaly Extremities: extremities normal, atraumatic, no cyanosis or edema Neurologic: Alert and oriented X 3, normal strength and tone. Normal symmetric reflexes. Normal coordination and gait  ECOG PERFORMANCE STATUS: 1 - Symptomatic but completely ambulatory  Blood pressure 137/78, pulse 75, temperature 98.3 F (36.8 C), temperature source Oral, resp. rate 18, height '5\' 11"'$  (1.803 m), weight 190 lb 1.6 oz (86.229 kg), SpO2 100 %.  LABORATORY DATA: Lab Results  Component Value Date   WBC 2.1* 10/16/2014   HGB 10.5* 10/16/2014   HCT 32.6* 10/16/2014   MCV 78.0* 10/16/2014   PLT 163 10/16/2014      Chemistry      Component Value Date/Time   NA 139 10/16/2014 0852   NA 136 05/05/2014 2010   K 4.9 10/16/2014 0852   K 4.0 05/05/2014 2010   CL 102 05/05/2014 2010   CO2 24 10/16/2014 0852   CO2 28 05/05/2014 2010   BUN 15.9 10/16/2014 0852   BUN 14 05/05/2014 2010   CREATININE 1.6* 10/16/2014 0852   CREATININE 1.26 05/05/2014 2010   CREATININE 1.02 01/23/2014 1313      Component Value Date/Time   CALCIUM 9.8 10/16/2014 0852   CALCIUM 9.4 05/05/2014 2010   ALKPHOS 125 10/16/2014 0852   ALKPHOS 108 05/05/2014 2049   AST 33 10/16/2014 0852   AST 20 05/05/2014 2049   ALT 48 10/16/2014 0852   ALT 24 05/05/2014 2049   BILITOT 0.32 10/16/2014 0852   BILITOT 0.5 05/05/2014 2049       RADIOGRAPHIC STUDIES: No results found.  ASSESSMENT AND PLAN: This is a very pleasant 54 years old African-American male with a stage IV non-small cell lung cancer, squamous cell carcinoma was currently undergoing systemic chemotherapy with carboplatin and gemcitabine status post 5 cycles. The patient is doing fine today and tolerating his treatment fairly well except for fatigue and shortness breath with exertion. The patient was discussed with and also seen by Dr. Julien Nordmann. Recommended for the patient to proceed with day 8 of cycle #6 today as scheduled. He will return for a   follow-up visit in 3 weeks after repeating CT scan of the chest, abdomen and pelvis for restaging of his disease. He was given a prescription for Ensure as requested. CODE STATUS was discussed with the patient and he would like to continue with full code but no prolonged resuscitation. He was advised to call immediately if he has any concerning symptoms in the interval. The patient voices understanding of current disease status and treatment options and is in agreement with the current care plan.  All questions were answered. The patient knows to call the clinic with any problems, questions or concerns. We can certainly see the patient much sooner if necessary.  Carlton Adam, PA-C 10/16/2014   ADDENDUM: Hematology/Oncology Attending: I had a face to face encounter with the patient. I recommended his care plan. This is a very pleasant 54 years old African-American male with metastatic non-small cell lung cancer, squamous cell carcinoma is currently undergoing systemic chemotherapy with carboplatin and gemcitabine status post 5 cycles and he is currently undergoing treatment with cycle #6. The patient has been tolerating his treatment fairly well with no significant adverse effects except for mild fatigue. I recommended for  the patient to proceed with day 8 of cycle #6 as scheduled today. For the poor mutation and the patient was given prescription for ensure. The patient would come back for follow-up visit in 2 weeks for evaluation after repeating CT scan of the chest, abdomen and pelvis for restaging of his disease. He was advised to call immediately if he has any concerning symptoms in the interval.  Disclaimer: This note was dictated with voice recognition software. Similar sounding words can inadvertently be transcribed and may not be corrected upon review. Eilleen Kempf., MD 10/20/2014

## 2014-10-16 NOTE — Progress Notes (Signed)
OK to treat per Dr. Julien Nordmann with ANC of 1.1 and creatinine of 1.6

## 2014-10-17 ENCOUNTER — Encounter: Payer: Self-pay | Admitting: *Deleted

## 2014-10-17 NOTE — Progress Notes (Signed)
[facility] Work  Clinical Social Work was referred by [facility] social worker to complete [facility] referral through medical oncologist.  CSW provided paperwork to medical oncology and completed paperwork to fax in to [facility] care services.   Polo Riley, MSW, LCSW, OSW-C Clinical Social Worker [facility] [phone]

## 2014-10-18 ENCOUNTER — Telehealth: Payer: Self-pay | Admitting: *Deleted

## 2014-10-18 NOTE — Patient Instructions (Signed)
Follow up in 3 weeks with a restaging CT scan of your chest, abdomen and pelvis to re-evaluate your disease 

## 2014-10-18 NOTE — Telephone Encounter (Signed)
Medical assistance paperwork faxed to [facility] health care [phone]

## 2014-10-30 ENCOUNTER — Ambulatory Visit (HOSPITAL_COMMUNITY)
Admission: RE | Admit: 2014-10-30 | Discharge: 2014-10-30 | Disposition: A | Discharge status: Home / Self Care | Payer: Medicare Other | Source: Ambulatory Visit | Attending: Internal Medicine | Admitting: Internal Medicine

## 2014-10-30 ENCOUNTER — Other Ambulatory Visit: Payer: Medicare Other

## 2014-10-30 ENCOUNTER — Other Ambulatory Visit (HOSPITAL_BASED_OUTPATIENT_CLINIC_OR_DEPARTMENT_OTHER): Payer: Medicare Other

## 2014-10-30 DIAGNOSIS — C3490 Malignant neoplasm of unspecified part of unspecified bronchus or lung: Secondary | ICD-10-CM | POA: Diagnosis present

## 2014-10-30 LAB — COMPREHENSIVE METABOLIC PANEL (CC13)
ALBUMIN: 3.9 g/dL (ref 3.5–5.0)
ALK PHOS: 108 U/L (ref 40–150)
ALT: 33 U/L (ref 0–55)
AST: 25 U/L (ref 5–34)
Anion Gap: 7 mEq/L (ref 3–11)
BUN: 18.2 mg/dL (ref 7.0–26.0)
CO2: 27 meq/L (ref 22–29)
Calcium: 9.7 mg/dL (ref 8.4–10.4)
Chloride: 107 mEq/L (ref 98–109)
Creatinine: 1.6 mg/dL — ABNORMAL HIGH (ref 0.7–1.3)
EGFR: 58 mL/min/{1.73_m2} — ABNORMAL LOW (ref >90)
GLUCOSE: 111 mg/dL (ref 70–140)
POTASSIUM: 4.8 meq/L (ref 3.5–5.1)
Sodium: 141 mEq/L (ref 136–145)
Total Bilirubin: 0.25 mg/dL (ref 0.20–1.20)
Total Protein: 8 g/dL (ref 6.4–8.3)

## 2014-10-30 LAB — CBC WITH DIFFERENTIAL/PLATELET
BASO%: 0.4 % (ref 0.0–2.0)
BASOS ABS: 0 10*3/uL (ref 0.0–0.1)
EOS ABS: 0 10*3/uL (ref 0.0–0.5)
EOS%: 1.1 % (ref 0.0–7.0)
HCT: 28.2 % — ABNORMAL LOW (ref 38.4–49.9)
HGB: 9.4 g/dL — ABNORMAL LOW (ref 13.0–17.1)
LYMPH%: 27.6 % (ref 14.0–49.0)
MCH: 25.8 pg — AB (ref 27.2–33.4)
MCHC: 33.3 g/dL (ref 32.0–36.0)
MCV: 77.5 fL — AB (ref 79.3–98.0)
MONO#: 0.7 10*3/uL (ref 0.1–0.9)
MONO%: 23.3 % — AB (ref 0.0–14.0)
NEUT#: 1.3 10*3/uL — ABNORMAL LOW (ref 1.5–6.5)
NEUT%: 47.6 % (ref 39.0–75.0)
Platelets: 220 10*3/uL (ref 140–400)
RBC: 3.64 10*6/uL — AB (ref 4.20–5.82)
RDW: 16.4 % — ABNORMAL HIGH (ref 11.0–14.6)
WBC: 2.8 10*3/uL — ABNORMAL LOW (ref 4.0–10.3)
lymph#: 0.8 10*3/uL — ABNORMAL LOW (ref 0.9–3.3)

## 2014-10-30 MED ORDER — IOHEXOL 300 MG/ML  SOLN
100.0000 mL | Freq: Once | INTRAMUSCULAR | Status: AC | PRN
  Administered 2014-10-30: 100 mL via INTRAVENOUS

## 2014-11-06 ENCOUNTER — Telehealth: Payer: Self-pay | Admitting: Internal Medicine

## 2014-11-06 ENCOUNTER — Ambulatory Visit (HOSPITAL_BASED_OUTPATIENT_CLINIC_OR_DEPARTMENT_OTHER): Payer: Medicare Other | Admitting: Internal Medicine

## 2014-11-06 ENCOUNTER — Encounter: Payer: Self-pay | Admitting: Internal Medicine

## 2014-11-06 VITALS — BP 111/73 | HR 84 | Temp 98.1°F | Resp 20 | Ht 71.0 in | Wt 192.2 lb

## 2014-11-06 DIAGNOSIS — C3491 Malignant neoplasm of unspecified part of right bronchus or lung: Secondary | ICD-10-CM | POA: Diagnosis present

## 2014-11-06 DIAGNOSIS — Z5111 Encounter for antineoplastic chemotherapy: Secondary | ICD-10-CM

## 2014-11-06 NOTE — Telephone Encounter (Signed)
per pof to sch pt appt-gave pt copy of avs °

## 2014-11-06 NOTE — Progress Notes (Signed)
[place] [phone]   [phone]  OFFICE PROGRESS NOTE  [name], MD [address] [address]  DIAGNOSIS: Stage IV (T2a, N3, M1b) non-small cell lung cancer, squamous cell carcinoma diagnosed in April 2016.  PRIOR THERAPY: Systemic chemotherapy with carboplatin for AUC of 5 given on day 1 and gemcitabine 1000 MG/M2 on days 1 and 8 every 3 weeks. Status post 6 cycles with partial response.  CURRENT THERAPY: None.  CODE STATUS: Full code initially but no prolonged resuscitation    INTERVAL HISTORY: Keith Holloway 54 y.o. male returns to the clinic today for follow-up visit accompanied by his wife and daughter. The patient is treating his current treatment with carboplatin and gemcitabine fairly well. He is status post 6 cycles. He denied having any significant chest pain but continues to have shortness breath with exertion with mild cough and no hemoptysis. He denied having any significant weight loss or night sweats. The patient denied having any fever or chills. He has no nausea or vomiting. He had repeat CT scan of the chest, abdomen and pelvis performed recently and he is here for evaluation and discussion of his scan results.  MEDICAL HISTORY: Past Medical History  Diagnosis Date  . Diabetes mellitus without complication   . Hypertension   . High cholesterol   . Migraine   . Depression   . Arthritis   . Alcohol abuse   . Polysubstance abuse     alcohol and cocaine  . Polyneuropathy in diabetes(357.2)   . Memory disorder 08/22/2013  . CHF (congestive heart failure)   . Tobacco abuse counseling 10/09/2014    ALLERGIES:  has No Known Allergies.  MEDICATIONS:  Current Outpatient Prescriptions  Medication Sig Dispense Refill  . insulin NPH-regular Human (NOVOLIN 70/30) (70-30) 100 UNIT/ML injection Inject 35 Units into the skin 2 (two) times daily with a meal. 10 mL 11  . lisinopril (PRINIVIL,ZESTRIL) 20 MG tablet  Take 1 tablet (20 mg total) by mouth daily. For kidney protection 60 tablet 2  . metFORMIN (GLUCOPHAGE) 1000 MG tablet Take 1 tablet (1,000 mg total) by mouth 2 (two) times daily. 60 tablet 12  . nicotine (NICODERM CQ) 14 mg/24hr patch Place 1 patch (14 mg total) onto the skin daily. 28 patch 0  . prochlorperazine (COMPAZINE) 10 MG tablet Take 1 tablet (10 mg total) by mouth every 6 (six) hours as needed for nausea or vomiting. (Patient not taking: Reported on 10/09/2014) 30 tablet 0  . simvastatin (ZOCOR) 40 MG tablet Take 1 tablet (40 mg total) by mouth daily. 30 tablet 3   No current facility-administered medications for this visit.    SURGICAL HISTORY:  Past Surgical History  Procedure Laterality Date  . Eye surgery  04/2012    detatched retna, bilateral  . Eye surgery  08/2012  . Cataract extraction      bilaterally    REVIEW OF SYSTEMS:  Constitutional: positive for fatigue Eyes: negative Ears, nose, mouth, throat, and face: negative Respiratory: positive for dyspnea on exertion Cardiovascular: negative Gastrointestinal: negative Genitourinary:negative Integument/breast: negative Hematologic/lymphatic: negative Musculoskeletal:negative Neurological: negative Behavioral/Psych: negative Endocrine: negative Allergic/Immunologic: negative   PHYSICAL EXAMINATION: General appearance: alert, cooperative, fatigued and no distress Head: Normocephalic, without obvious abnormality, atraumatic Neck: no adenopathy, no JVD, supple, symmetrical, trachea midline and thyroid not enlarged, symmetric, no tenderness/mass/nodules Lymph nodes: Cervical, supraclavicular, and axillary nodes normal. Resp: clear to auscultation bilaterally Back: symmetric, no curvature. ROM normal. No CVA  tenderness. Cardio: regular rate and rhythm, S1, S2 normal, no murmur, click, rub or gallop GI: soft, non-tender; bowel sounds normal; no masses,  no organomegaly Extremities: extremities normal, atraumatic, no  cyanosis or edema Neurologic: Alert and oriented X 3, normal strength and tone. Normal symmetric reflexes. Normal coordination and gait  ECOG PERFORMANCE STATUS: 1 - Symptomatic but completely ambulatory  There were no vitals taken for this visit.  LABORATORY DATA: Lab Results  Component Value Date   WBC 2.8* 10/30/2014   HGB 9.4* 10/30/2014   HCT 28.2* 10/30/2014   MCV 77.5* 10/30/2014   PLT 220 10/30/2014      Chemistry      Component Value Date/Time   NA 141 10/30/2014 1011   NA 136 05/05/2014 2010   K 4.8 10/30/2014 1011   K 4.0 05/05/2014 2010   CL 102 05/05/2014 2010   CO2 27 10/30/2014 1011   CO2 28 05/05/2014 2010   BUN 18.2 10/30/2014 1011   BUN 14 05/05/2014 2010   CREATININE 1.6* 10/30/2014 1011   CREATININE 1.26 05/05/2014 2010   CREATININE 1.02 01/23/2014 1313      Component Value Date/Time   CALCIUM 9.7 10/30/2014 1011   CALCIUM 9.4 05/05/2014 2010   ALKPHOS 108 10/30/2014 1011   ALKPHOS 108 05/05/2014 2049   AST 25 10/30/2014 1011   AST 20 05/05/2014 2049   ALT 33 10/30/2014 1011   ALT 24 05/05/2014 2049   BILITOT 0.25 10/30/2014 1011   BILITOT 0.5 05/05/2014 2049       RADIOGRAPHIC STUDIES: Ct Chest W Contrast  10/30/2014   EXAM: CT CHEST, ABDOMEN, AND PELVIS WITH CONTRAST  TECHNIQUE: Multidetector CT imaging of the chest, abdomen and pelvis was performed following the standard protocol during bolus administration of intravenous contrast.  CONTRAST:  134m OMNIPAQUE IOHEXOL 300 MG/ML  SOLN  COMPARISON:  None.  FINDINGS: CT CHEST FINDINGS  Mediastinum/Nodes: Stable to slight interval decrease in the size of prevascular soft tissue mass measuring 6.3 x 3.5 cm, previously 3.8 x 6.2 cm (image 20; series 2). Grossly unchanged soft tissue fullness within the inferior left hilar region measuring up to 1.9 cm (image 33; series 2). Normal heart size. No pericardial effusion. Aorta and main pulmonary artery are normal in caliber.  Lungs/Pleura: Central airways  are patent. Re- demonstrated bandlike opacities within the right and left lower lobes favored to represent atelectasis. Multiple new ill defined predominantly ground-glass nodules are demonstrated within the left lower lobe measuring up to 9 mm (image 42; series 7). Unchanged right upper lobe nodule measuring 6 mm (image 17; series 7). No pleural effusion or pneumothorax.  CT ABDOMEN AND PELVIS FINDINGS  Hepatobiliary: Liver is normal in size and contour. No focal hepatic lesion identified. Gallbladder is unremarkable. No intrahepatic or extrahepatic biliary ductal dilatation.  Pancreas: Unremarkable  Spleen: Unremarkable  Adrenals/Urinary Tract: The adrenal glands are normal. Kidneys enhance symmetrically with contrast. No hydronephrosis. Urinary bladder is unremarkable.  Stomach/Bowel: Stool within the colon. No abnormal bowel wall thickening or evidence for bowel obstruction. No free fluid or free intraperitoneal air.  Vascular/Lymphatic: Normal caliber abdominal aorta. Peripheral calcified atherosclerotic plaque.  Other: Prostate is mildly enlarged.  Musculoskeletal: Unchanged sclerosis involving the left aspect of the manubrium (image 14; series 2).  IMPRESSION: Stable to slight interval decrease in size of prevascular nodal conglomerate. Measurement comparisons are difficult given lack of IV contrast on prior exam.  Interval development of multiple ill-defined ground-glass nodules within the left lower lung which are  favored to be infectious or inflammatory in etiology however metastatic disease is not excluded. Unchanged right upper lobe pulmonary nodule.  Unchanged manubrial metastasis.   Electronically Signed   By: Lovey Newcomer M.D.   On: 10/30/2014 14:52   Ct Abdomen Pelvis W Contrast  10/30/2014   EXAM: CT CHEST, ABDOMEN, AND PELVIS WITH CONTRAST  TECHNIQUE: Multidetector CT imaging of the chest, abdomen and pelvis was performed following the standard protocol during bolus administration of intravenous  contrast.  CONTRAST:  138m OMNIPAQUE IOHEXOL 300 MG/ML  SOLN  COMPARISON:  None.  FINDINGS: CT CHEST FINDINGS  Mediastinum/Nodes: Stable to slight interval decrease in the size of prevascular soft tissue mass measuring 6.3 x 3.5 cm, previously 3.8 x 6.2 cm (image 20; series 2). Grossly unchanged soft tissue fullness within the inferior left hilar region measuring up to 1.9 cm (image 33; series 2). Normal heart size. No pericardial effusion. Aorta and main pulmonary artery are normal in caliber.  Lungs/Pleura: Central airways are patent. Re- demonstrated bandlike opacities within the right and left lower lobes favored to represent atelectasis. Multiple new ill defined predominantly ground-glass nodules are demonstrated within the left lower lobe measuring up to 9 mm (image 42; series 7). Unchanged right upper lobe nodule measuring 6 mm (image 17; series 7). No pleural effusion or pneumothorax.  CT ABDOMEN AND PELVIS FINDINGS  Hepatobiliary: Liver is normal in size and contour. No focal hepatic lesion identified. Gallbladder is unremarkable. No intrahepatic or extrahepatic biliary ductal dilatation.  Pancreas: Unremarkable  Spleen: Unremarkable  Adrenals/Urinary Tract: The adrenal glands are normal. Kidneys enhance symmetrically with contrast. No hydronephrosis. Urinary bladder is unremarkable.  Stomach/Bowel: Stool within the colon. No abnormal bowel wall thickening or evidence for bowel obstruction. No free fluid or free intraperitoneal air.  Vascular/Lymphatic: Normal caliber abdominal aorta. Peripheral calcified atherosclerotic plaque.  Other: Prostate is mildly enlarged.  Musculoskeletal: Unchanged sclerosis involving the left aspect of the manubrium (image 14; series 2).  IMPRESSION: Stable to slight interval decrease in size of prevascular nodal conglomerate. Measurement comparisons are difficult given lack of IV contrast on prior exam.  Interval development of multiple ill-defined ground-glass nodules within  the left lower lung which are favored to be infectious or inflammatory in etiology however metastatic disease is not excluded. Unchanged right upper lobe pulmonary nodule.  Unchanged manubrial metastasis.   Electronically Signed   By: DLovey NewcomerM.D.   On: 10/30/2014 14:52    ASSESSMENT AND PLAN: This is a very pleasant 54years old African-American male with a stage IV non-small cell lung cancer, squamous cell carcinoma who completed systemic chemotherapy with carboplatin and gemcitabine status post 6 cycles. The patient has tolerated his treatment fairly well with no significant adverse effects except for mild fatigue and nausea. The recent CT scan of the chest, abdomen and pelvis showed stable disease with no significant disease progression. I discussed the scan results with the patient and his family. I recommended for him to continue on observation for now with repeat CT scan of the chest, abdomen and pelvis in 3 months. For smoke cessation, I started the patient on Nicoderm 14 mg/24 hour.  CODE STATUS was discussed with the patient and he would like to continue with full code but no prolonged resuscitation. He was advised to call immediately if he has any concerning symptoms in the interval. The patient voices understanding of current disease status and treatment options and is in agreement with the current care plan.  All questions were answered.  The patient knows to call the clinic with any problems, questions or concerns. We can certainly see the patient much sooner if necessary.  Disclaimer: This note was dictated with voice recognition software. Similar sounding words can inadvertently be transcribed and may not be corrected upon review.

## 2014-11-14 ENCOUNTER — Encounter: Payer: Self-pay | Admitting: *Deleted

## 2014-11-14 NOTE — Progress Notes (Signed)
[place] Social Work  Holiday representative met with patient spouse and daughter in [place] office.  The patient designated spouse Linton Rump (goes by [employer]) as their primary healthcare agent and sister Inez Catalina as their secondary agent.  Patient also completed healthcare living will.    Clinical Social Worker notarized documents and made copies for patient/family. Clinical Social Worker will send documents to medical records to be scanned into patient's chart. Clinical Social Worker encouraged patient/family to contact with any additional questions or concerns.  Polo Riley, MSW, [place] Worker [facility] [phone]

## 2014-11-22 ENCOUNTER — Ambulatory Visit: Payer: Medicaid Other | Admitting: Adult Health

## 2014-11-25 ENCOUNTER — Telehealth: Payer: Self-pay

## 2014-11-25 DIAGNOSIS — C3492 Malignant neoplasm of unspecified part of left bronchus or lung: Secondary | ICD-10-CM

## 2014-11-25 MED ORDER — HYDROCODONE-ACETAMINOPHEN 5-325 MG PO TABS: 2.0000 | ORAL_TABLET | Freq: Four times a day (QID) | ORAL | Status: DC | PRN

## 2014-11-25 NOTE — Telephone Encounter (Signed)
Family member called stating pt is having L side pain.

## 2014-11-25 NOTE — Telephone Encounter (Signed)
Norco last filled 7/13. Reviewed with MD, ok to refill. Notified pt rx ready for pick up

## 2014-11-25 NOTE — Telephone Encounter (Signed)
C/o L side hurts. For about 1 week. Dull aches with cough or moving. Pain pill does stop the pain. He started using his norco in the last week. Been coughing for about the same amount of time. Nonproductive cough. No fever, no loss of apetite, no constipation, no diarrhea. He will need a refill (new rx) for his norco - it is no longer on his MAR. His next appt is 02/12/15.

## 2015-01-20 ENCOUNTER — Ambulatory Visit (HOSPITAL_BASED_OUTPATIENT_CLINIC_OR_DEPARTMENT_OTHER): Payer: Medicare Other | Admitting: Nurse Practitioner

## 2015-01-20 ENCOUNTER — Telehealth: Payer: Self-pay | Admitting: *Deleted

## 2015-01-20 ENCOUNTER — Emergency Department (HOSPITAL_COMMUNITY): Payer: Medicare Other

## 2015-01-20 ENCOUNTER — Emergency Department (HOSPITAL_COMMUNITY)
Admission: EM | Admit: 2015-01-20 | Discharge: 2015-01-20 | Disposition: A | Discharge status: Home / Self Care | Payer: Medicare Other | Attending: Emergency Medicine | Admitting: Emergency Medicine

## 2015-01-20 ENCOUNTER — Other Ambulatory Visit: Payer: Self-pay

## 2015-01-20 ENCOUNTER — Ambulatory Visit (HOSPITAL_BASED_OUTPATIENT_CLINIC_OR_DEPARTMENT_OTHER): Payer: Medicare Other

## 2015-01-20 ENCOUNTER — Encounter (HOSPITAL_COMMUNITY): Payer: Self-pay | Admitting: Emergency Medicine

## 2015-01-20 VITALS — BP 126/84 | HR 78 | Temp 98.2°F | Resp 18 | Ht 71.0 in | Wt 188.4 lb

## 2015-01-20 DIAGNOSIS — H548 Legal blindness, as defined in USA: Secondary | ICD-10-CM | POA: Diagnosis not present

## 2015-01-20 DIAGNOSIS — C3432 Malignant neoplasm of lower lobe, left bronchus or lung: Secondary | ICD-10-CM

## 2015-01-20 DIAGNOSIS — Z79899 Other long term (current) drug therapy: Secondary | ICD-10-CM | POA: Diagnosis not present

## 2015-01-20 DIAGNOSIS — R0602 Shortness of breath: Secondary | ICD-10-CM

## 2015-01-20 DIAGNOSIS — Z7982 Long term (current) use of aspirin: Secondary | ICD-10-CM | POA: Diagnosis not present

## 2015-01-20 DIAGNOSIS — C3492 Malignant neoplasm of unspecified part of left bronchus or lung: Secondary | ICD-10-CM | POA: Diagnosis present

## 2015-01-20 DIAGNOSIS — Z794 Long term (current) use of insulin: Secondary | ICD-10-CM | POA: Diagnosis not present

## 2015-01-20 DIAGNOSIS — R05 Cough: Secondary | ICD-10-CM

## 2015-01-20 DIAGNOSIS — E7800 Pure hypercholesterolemia, unspecified: Secondary | ICD-10-CM | POA: Diagnosis not present

## 2015-01-20 DIAGNOSIS — Z8659 Personal history of other mental and behavioral disorders: Secondary | ICD-10-CM | POA: Diagnosis not present

## 2015-01-20 DIAGNOSIS — E1140 Type 2 diabetes mellitus with diabetic neuropathy, unspecified: Secondary | ICD-10-CM | POA: Insufficient documentation

## 2015-01-20 DIAGNOSIS — M1990 Unspecified osteoarthritis, unspecified site: Secondary | ICD-10-CM | POA: Insufficient documentation

## 2015-01-20 DIAGNOSIS — N182 Chronic kidney disease, stage 2 (mild): Secondary | ICD-10-CM

## 2015-01-20 DIAGNOSIS — C3491 Malignant neoplasm of unspecified part of right bronchus or lung: Secondary | ICD-10-CM | POA: Insufficient documentation

## 2015-01-20 DIAGNOSIS — N184 Chronic kidney disease, stage 4 (severe): Secondary | ICD-10-CM

## 2015-01-20 DIAGNOSIS — R059 Cough, unspecified: Secondary | ICD-10-CM

## 2015-01-20 DIAGNOSIS — I10 Essential (primary) hypertension: Secondary | ICD-10-CM | POA: Diagnosis not present

## 2015-01-20 DIAGNOSIS — Z87891 Personal history of nicotine dependence: Secondary | ICD-10-CM | POA: Insufficient documentation

## 2015-01-20 DIAGNOSIS — I509 Heart failure, unspecified: Secondary | ICD-10-CM | POA: Diagnosis not present

## 2015-01-20 DIAGNOSIS — C799 Secondary malignant neoplasm of unspecified site: Secondary | ICD-10-CM

## 2015-01-20 LAB — COMPREHENSIVE METABOLIC PANEL
ALT: 16 U/L (ref 0–55)
ANION GAP: 8 meq/L (ref 3–11)
AST: 24 U/L (ref 5–34)
Albumin: 3.6 g/dL (ref 3.5–5.0)
Alkaline Phosphatase: 151 U/L — ABNORMAL HIGH (ref 40–150)
BILIRUBIN TOTAL: 0.32 mg/dL (ref 0.20–1.20)
BUN: 21.2 mg/dL (ref 7.0–26.0)
CALCIUM: 9.3 mg/dL (ref 8.4–10.4)
CO2: 23 mEq/L (ref 22–29)
CREATININE: 1.8 mg/dL — AB (ref 0.7–1.3)
Chloride: 104 mEq/L (ref 98–109)
EGFR: 49 mL/min/{1.73_m2} — ABNORMAL LOW (ref >90)
Glucose: 215 mg/dl — ABNORMAL HIGH (ref 70–140)
Potassium: 4.6 mEq/L (ref 3.5–5.1)
Sodium: 136 mEq/L (ref 136–145)
TOTAL PROTEIN: 7.9 g/dL (ref 6.4–8.3)

## 2015-01-20 LAB — BASIC METABOLIC PANEL
ANION GAP: 8 (ref 5–15)
BUN: 22 mg/dL — ABNORMAL HIGH (ref 6–20)
CHLORIDE: 104 mmol/L (ref 101–111)
CO2: 25 mmol/L (ref 22–32)
Calcium: 9.2 mg/dL (ref 8.9–10.3)
Creatinine, Ser: 1.65 mg/dL — ABNORMAL HIGH (ref 0.61–1.24)
GFR calc non Af Amer: 46 mL/min — ABNORMAL LOW (ref >60)
GFR, EST AFRICAN AMERICAN: 53 mL/min — AB (ref >60)
GLUCOSE: 156 mg/dL — AB (ref 65–99)
Potassium: 4.4 mmol/L (ref 3.5–5.1)
Sodium: 137 mmol/L (ref 135–145)

## 2015-01-20 LAB — CBC WITH DIFFERENTIAL/PLATELET
BASO%: 0.2 % (ref 0.0–2.0)
BASOS PCT: 0 %
Basophils Absolute: 0 10*3/uL (ref 0.0–0.1)
Basophils Absolute: 0 10*3/uL (ref 0.0–0.1)
EOS ABS: 0.1 10*3/uL (ref 0.0–0.5)
EOS PCT: 2 %
EOS%: 1.4 % (ref 0.0–7.0)
Eosinophils Absolute: 0.2 10*3/uL (ref 0.0–0.7)
HEMATOCRIT: 33.1 % — AB (ref 38.4–49.9)
HEMATOCRIT: 33.4 % — AB (ref 39.0–52.0)
HEMOGLOBIN: 11.1 g/dL — AB (ref 13.0–17.1)
HEMOGLOBIN: 11.3 g/dL — AB (ref 13.0–17.0)
LYMPH#: 1.2 10*3/uL (ref 0.9–3.3)
LYMPH%: 14 % (ref 14.0–49.0)
LYMPHS ABS: 1.6 10*3/uL (ref 0.7–4.0)
Lymphocytes Relative: 15 %
MCH: 23.1 pg — ABNORMAL LOW (ref 27.2–33.4)
MCH: 23.3 pg — ABNORMAL LOW (ref 26.0–34.0)
MCHC: 33.5 g/dL (ref 32.0–36.0)
MCHC: 33.8 g/dL (ref 30.0–36.0)
MCV: 68.8 fL — ABNORMAL LOW (ref 79.3–98.0)
MCV: 68.7 fL — AB (ref 78.0–100.0)
MONO#: 0.8 10*3/uL (ref 0.1–0.9)
MONO%: 9.4 % (ref 0.0–14.0)
Monocytes Absolute: 1.4 10*3/uL — ABNORMAL HIGH (ref 0.1–1.0)
Monocytes Relative: 13 %
NEUT%: 75 % (ref 39.0–75.0)
NEUTROS ABS: 6.4 10*3/uL (ref 1.5–6.5)
NEUTROS PCT: 70 %
NRBC: 0 % (ref 0–0)
Neutro Abs: 7.4 10*3/uL (ref 1.7–7.7)
PLATELETS: 222 10*3/uL (ref 140–400)
Platelets: 246 10*3/uL (ref 150–400)
RBC: 4.81 10*6/uL (ref 4.20–5.82)
RBC: 4.86 MIL/uL (ref 4.22–5.81)
RDW: 14.4 % (ref 11.0–14.6)
RDW: 14.3 % (ref 11.5–15.5)
WBC: 8.6 10*3/uL (ref 4.0–10.3)
WBC: 10.6 10*3/uL — AB (ref 4.0–10.5)

## 2015-01-20 MED ORDER — IOHEXOL 350 MG/ML SOLN
80.0000 mL | Freq: Once | INTRAVENOUS | Status: AC | PRN
  Administered 2015-01-20: 80 mL via INTRAVENOUS

## 2015-01-20 MED ORDER — BENZONATATE 100 MG PO CAPS: 100.0000 mg | ORAL_CAPSULE | Freq: Three times a day (TID) | ORAL | Status: DC

## 2015-01-20 MED ORDER — ALBUTEROL SULFATE (2.5 MG/3ML) 0.083% IN NEBU
5.0000 mg | INHALATION_SOLUTION | Freq: Once | RESPIRATORY_TRACT | Status: AC
  Administered 2015-01-20: 5 mg via RESPIRATORY_TRACT
  Filled 2015-01-20: qty 6

## 2015-01-20 MED ORDER — SODIUM CHLORIDE 0.9 % IV BOLUS (SEPSIS)
1000.0000 mL | Freq: Once | INTRAVENOUS | Status: AC
  Administered 2015-01-20: 1000 mL via INTRAVENOUS

## 2015-01-20 NOTE — Telephone Encounter (Signed)
Pt and family reported to [facility] and filled out a walk in form. Pt is SOB, Cough and chest pain with

## 2015-01-20 NOTE — ED Notes (Addendum)
Patient went to [facility] with shortness of breath. [facility] wanted patient to be evaluated for PE. Dx with stage 4 lung cancer. Last chemo in September

## 2015-01-20 NOTE — ED Notes (Signed)
MD at bedside. 

## 2015-01-20 NOTE — ED Notes (Signed)
Patient transported to CT 

## 2015-01-20 NOTE — Discharge Instructions (Signed)
Lung Cancer °Lung cancer occurs when abnormal cells in the lung grow out of control and form a mass (tumor). There are several types of lung cancer. The two most common types are: °· Non-small cell. In this type of lung cancer, abnormal cells are larger and grow more slowly than those of small cell lung cancer. °· Small cell. In this type of lung cancer, abnormal cells are smaller than those of non-small cell lung cancer. Small cell lung cancer gets worse faster than non-small cell lung cancer. °CAUSES  °The leading cause of lung cancer is smoking tobacco. The second leading cause is radon exposure. °RISK FACTORS °· Smoking tobacco. °· Exposure to secondhand tobacco smoke. °· Exposure to radon gas. °· Exposure to asbestos. °· Exposure to arsenic in drinking water. °· Air pollution. °· Family or personal history of lung cancer. °· Lung radiation therapy. °· Being older than 65 years. °SIGNS AND SYMPTOMS  °In the early stages, symptoms may not be present. As the cancer progresses, symptoms may include: °· A lasting cough, possibly with blood. °· Fatigue. °· Unexplained weight loss. °· Shortness of breath. °· Wheezing. °· Chest pain. °· Loss of appetite. °Symptoms of advanced lung cancer include: °· Hoarseness. °· Bone or joint pain. °· Weakness. °· Nail problems. °· Face or arm swelling. °· Paralysis of the face. °· Drooping eyelids. °DIAGNOSIS  °Lung cancer can be identified with a physical exam and with tests such as: °· A chest X-ray. °· A CT scan. °· Blood tests. °· A biopsy. °After a diagnosis is made, you will have more tests to determine the stage of the cancer. The stages of non-small cell lung cancer are: °· Stage 0, also called carcinoma in situ. At this stage, abnormal cells are found in the inner lining of your lung or lungs. °· Stage I. At this stage, abnormal cells have grown into a tumor that is no larger than 5 cm across. The cancer has entered the deeper lung tissue but has not yet entered the lymph  nodes or other parts of the body. °· Stage II. At this stage, the tumor is 7 cm across or smaller and has entered nearby lymph nodes. Or, the tumor is 5 cm across or smaller and has invaded surrounding tissue but is not found in nearby lymph nodes. There may be more than one tumor present. °· Stage III. At this stage, the tumor may be any size. There may be more than one tumor in the lungs. The cancer cells have spread to the lymph nodes and possibly to other organs. °· Stage IV. At this stage, there are tumors in both lungs and the cancer has spread to other areas of the body. °The stages of small cell lung cancer are: °· Limited. At this stage, the cancer is found only on one side of the chest. °· Extensive. At this stage, the cancer is in the lungs and in tissues on the other side of the chest. The cancer has spread to other organs or is found in the fluid between the layers of your lungs. °TREATMENT  °Depending on the type and stage of your lung cancer, you may be treated with: °· Surgery. This is done to remove a tumor. °· Radiation therapy. This treatment destroys cancer cells using X-rays or other types of radiation. °· Chemotherapy. This treatment uses medicines to destroy cancer cells. °· Targeted therapy. This treatment aims to destroy only cancer cells instead of all cells as other therapies do. °You may   also have a combination of treatments. °HOME CARE INSTRUCTIONS  °· Do not use any tobacco products. This includes cigarettes, chewing tobacco, and electronic cigarettes. If you need help quitting, ask your health care provider. °· Take medicines only as directed by your health care provider. °· Eat a healthy diet. Work with a dietitian to make sure you are getting the nutrition you need. °· Consider joining a support group or seeking counseling to help you cope with the stress of having lung cancer. °· Let your cancer specialist (oncologist) know if you are admitted to the hospital. °· Keep all follow-up  visits as directed by your health care provider. This is important. °SEEK MEDICAL CARE IF:  °· You lose weight without trying. °· You have a persistent cough and wheezing. °· You feel short of breath. °· You tire easily. °· You experience bone or joint pain. °· You have difficulty swallowing. °· You feel hoarse or notice your voice changing. °· Your pain medicine is not helping. °SEEK IMMEDIATE MEDICAL CARE IF:  °· You cough up blood. °· You have new breathing problems. °· You develop chest pain. °· You develop swelling in: °¨ One or both ankles or legs. °¨ Your face, neck, or arms. °· You are confused. °· You experience paralysis in your face or a drooping eyelid. °  °This information is not intended to replace advice given to you by your health care provider. Make sure you discuss any questions you have with your health care provider. °  °Document Released: 05/10/2000 Document Revised: 10/23/2014 Document Reviewed: 06/07/2013 °Elsevier Interactive Patient Education ©2016 [employer] ° °

## 2015-01-20 NOTE — ED Notes (Signed)
Bed: WA06 Expected date:  Expected time:  Means of arrival:  Comments: Sob-near syncope cancer pt

## 2015-01-20 NOTE — ED Notes (Signed)
Discharge instructions and rx x1 reviewed with patient and wife. Patient and family member verbalized understanding.

## 2015-01-20 NOTE — ED Provider Notes (Signed)
CSN: [id number]     Arrival date & time 01/20/15  1716 History   First MD Initiated Contact with Patient 01/20/15 1745     Chief Complaint  Patient presents with  . Shortness of Breath     (Consider location/radiation/quality/duration/timing/severity/associated sxs/prior Treatment) Patient is a 54 y.o. male presenting with shortness of breath. The history is provided by the patient.  Shortness of Breath Severity:  Moderate Onset quality:  Gradual Duration:  1 week Timing:  Constant Progression:  Unchanged Chronicity:  New Context: activity and smoke exposure (Pt is active smoker)   Relieved by:  Nothing Worsened by:  Nothing tried Ineffective treatments:  None tried Associated symptoms: cough (chronic, worsening)   Associated symptoms: no abdominal pain, no diaphoresis, no fever and no sputum production   Risk factors: hx of cancer   Risk factors: no hx of PE/DVT     Past Medical History  Diagnosis Date  . Diabetes mellitus without complication ([place])   . Hypertension   . High cholesterol   . Migraine   . Depression   . Arthritis   . Alcohol abuse   . Polysubstance abuse     alcohol and cocaine  . Polyneuropathy in diabetes(357.2)   . Memory disorder 08/22/2013  . CHF (congestive heart failure) ([place])   . Tobacco abuse counseling 10/09/2014  . Legally blind   . Cancer [facility])     Lung Cancer   Past Surgical History  Procedure Laterality Date  . Eye surgery  04/2012    detatched retna, bilateral  . Eye surgery  08/2012  . Cataract extraction      bilaterally   Family History  Problem Relation Age of Onset  . Alzheimer's disease Brother   . Cancer Father   . Hypertension Father   . Heart attack Father   . Hypertension Mother   . Gout Mother   . Diabetes Sister   . Hypertension Sister   . Hypertension Sister   . Hypertension Sister    Social History  Substance Use Topics  . Smoking status: Former Smoker -- 0.50 packs/day    Types: Cigarettes    Quit date:  04/16/2011  . Smokeless tobacco: Never Used     Comment: just recently began to smoke again after quitting in 2013, 3 cigarettes a week  . Alcohol Use: 1.2 oz/week    2 Cans of beer per week     Comment: states occassionally drinks beer    Review of Systems  Constitutional: Negative for fever and diaphoresis.  Respiratory: Positive for cough (chronic, worsening) and shortness of breath. Negative for sputum production.   Gastrointestinal: Negative for abdominal pain.  All other systems reviewed and are negative.     Allergies  Review of patient's allergies indicates no known allergies.  Home Medications   Prior to Admission medications   Medication Sig Start Date End Date Taking? Authorizing Provider  aspirin EC 81 MG tablet Take 1 tablet by mouth daily.   Yes Historical Provider, MD  HYDROcodone-acetaminophen (NORCO/VICODIN) 5-325 MG tablet Take 2 tablets by mouth every 6 (six) hours as needed. Patient taking differently: Take 2 tablets by mouth every 6 (six) hours as needed for moderate pain.  11/25/14  Yes Curt Bears, MD  insulin NPH-regular Human (NOVOLIN 70/30) (70-30) 100 UNIT/ML injection Inject 35 Units into the skin 2 (two) times daily with a meal. 01/23/14  Yes Deepak Advani, MD  losartan-hydrochlorothiazide (HYZAAR) 100-25 MG per tablet Take 1 tablet by mouth daily. 10/23/14  Yes Historical Provider, MD  metFORMIN (GLUCOPHAGE) 1000 MG tablet Take 1 tablet (1,000 mg total) by mouth 2 (two) times daily. 01/23/14  Yes Lorayne Marek, MD  nicotine (NICODERM CQ) 14 mg/24hr patch Place 1 patch (14 mg total) onto the skin daily. Patient taking differently: Place 14 mg onto the skin daily as needed (for nicotine addiction).  10/09/14  Yes Curt Bears, MD  simvastatin (ZOCOR) 40 MG tablet Take 1 tablet (40 mg total) by mouth daily. 01/23/14  Yes Lorayne Marek, MD  prochlorperazine (COMPAZINE) 10 MG tablet Take 1 tablet (10 mg total) by mouth every 6 (six) hours as needed for  nausea or vomiting. Patient not taking: Reported on 10/09/2014 06/13/14   Curt Bears, MD   BP 141/98 mmHg  Pulse 78  Temp(Src) 97.6 F (36.4 C) (Oral)  Resp 16  Ht '5\' 11"'$  (1.803 m)  Wt 188 lb (85.276 kg)  BMI 26.23 kg/m2  SpO2 100% Physical Exam  Constitutional: He is oriented to person, place, and time. He appears well-developed and well-nourished. No distress.  HENT:  Head: Normocephalic and atraumatic.  Eyes: Conjunctivae are normal.  Neck: Neck supple. No tracheal deviation present.  Cardiovascular: Normal rate, regular rhythm and normal heart sounds.   Pulmonary/Chest: Effort normal. No accessory muscle usage. No tachypnea. No respiratory distress. He has decreased breath sounds in the right upper field. He has rales in the right lower field and the left lower field.  Abdominal: Soft. He exhibits no distension.  Neurological: He is alert and oriented to person, place, and time.  Skin: Skin is warm and dry.  Psychiatric: He has a normal mood and affect.    ED Course  Procedures (including critical care time) Labs Review Labs Reviewed  CBC WITH DIFFERENTIAL/PLATELET - Abnormal; Notable for the following:    WBC 10.6 (*)    Hemoglobin 11.3 (*)    HCT 33.4 (*)    MCV 68.7 (*)    MCH 23.3 (*)    All other components within normal limits  BASIC METABOLIC PANEL    Imaging Review Dg Chest 2 View  01/20/2015  CLINICAL DATA:  Shortness of breath and cough, history of lung carcinoma EXAM: CHEST - 2 VIEW COMPARISON:  10/30/14 FINDINGS: Cardiac shadow is stable. The lungs are well aerated bilaterally. Mild chronic volume loss in the left lung base is noted. Nodular density is noted in the right lung apex slightly more prominent than that seen on the recent CT examination. No other focal abnormality is seen. Bilateral nipple shadows are noted. IMPRESSION: Mild chronic volume loss in the left base stable from the prior exam. Nodular density in the right apex slightly more prominent  than that seen on the prior CT. Electronically Signed   By: Inez Catalina M.D.   On: 01/20/2015 18:22   I have personally reviewed and evaluated these images and lab results as part of my medical decision-making.   EKG Interpretation None      MDM   Final diagnoses:  Non-small cell carcinoma of lung, stage 4, right (HCC)  Metastatic cancer (HCC)  Shortness of breath    54 y.o. male presents with ongoing issues with shortness of breath, has known Dx of stage 4 lung cancer. High risk for PE. Other than DOE no typical symptoms except for chronic ongoing cough. CT PE c/w progression of disease process now encroaching on pulmonary vein and appears to be cause of Pt's symptoms. No hypoxemia on ambulation or indication for home O2 currently.  F/u  ASAP with oncology and his PCP. Return precautions discussed.     Leo Grosser, MD 01/21/15 [phone]

## 2015-01-20 NOTE — Telephone Encounter (Signed)
Family member called stating that patient has been having increased SOB/cough and to the point of almost fainting. Patient is not currently on O2. Family member states that its hard for patient to even move without getting SOB and would like for him to be seen. RN recommended symptom management, but patient did not want to go during this conversation. RN informed patient about the walk-in policy and patient family member verbalized understanding. Message sent to MD [facility].

## 2015-01-20 NOTE — ED Notes (Addendum)
Patient ambulated around nurse's station. Patient's oxygen remained between 94-100% on room air while ambulating. Patient states his shortness of breath increased while ambulating but was not too much to keep him from ambulating. Patient coughing in room post-ambulation Laneta Simmers, MD made aware.

## 2015-01-22 ENCOUNTER — Telehealth: Payer: Self-pay | Admitting: Internal Medicine

## 2015-01-22 NOTE — Telephone Encounter (Signed)
S.w. Pt wife and advise don apt....the patient ok and aware

## 2015-01-23 ENCOUNTER — Encounter: Payer: Self-pay | Admitting: Nurse Practitioner

## 2015-01-23 DIAGNOSIS — N189 Chronic kidney disease, unspecified: Secondary | ICD-10-CM | POA: Insufficient documentation

## 2015-01-23 NOTE — Assessment & Plan Note (Signed)
Patient has history of chronic renal insufficiency.  Creatinine has increased from 1.6 up to 1.8 today.  Patient was encouraged to push fluids is much as possible.  Will continue to monitor.

## 2015-01-23 NOTE — Assessment & Plan Note (Signed)
Patient is status post 6 cycles of carboplatin/Gemzar chemotherapy.  He is currently undergoing observation only.  Patient reports mild to moderate increased shortness of breath , some occasional pain with inspiration,  Vague chest pressure , and occasional numbness /tingling/ discomfort down his left arm.    patient is considered legally blind; ambulates with assistance of a cane.  Patient states that he has been followed more recently at home.  He states that he fell over this past weekend and hit his head; but denies loss of consciousness.  He continues to smoke.   He also notes that he has been experiencing increased night sweats as well. He denies any recent fevers or chills.  On exam.-Patient with breath sounds clear bilaterally. Patient appears to have no cough or wheezes.  He appears in no acute respiratory distress.   given patient's complaints today.-Patient will be transported to the emergency department for further evaluation and management.  Brief history.  Report was given to the emergency department charge nurse prior to patient pain transported to the emergency department via wheelchair  Per the [facility].

## 2015-01-23 NOTE — Progress Notes (Addendum)
SYMPTOM MANAGEMENT CLINIC   HPI: Keith Holloway 54 y.o. male diagnosed with lung cancer.  Patient is status post carboplatin/Gemzar chemotherapy regimen.  Currently undergoing observation only.   Patient is status post 6 cycles of carboplatin/Gemzar chemotherapy.  He is currently undergoing observation only.  Patient reports mild to moderate increased shortness of breath , some occasional pain with inspiration,  Vague chest pressure , and occasional numbness /tingling/ discomfort down his left arm.    patient is considered legally blind; ambulates with assistance of a cane.  Patient states that he has been followed more recently at home.  He states that he fell over this past weekend and hit his head; but denies loss of consciousness.  He continues to smoke.   He also notes that he has been experiencing increased night sweats as well. He denies any recent fevers or chills.  HPI  ROS  Past Medical History  Diagnosis Date  . Diabetes mellitus without complication (HCC)   . Hypertension   . High cholesterol   . Migraine   . Depression   . Arthritis   . Alcohol abuse   . Polysubstance abuse     alcohol and cocaine  . Polyneuropathy in diabetes(357.2)   . Memory disorder 08/22/2013  . CHF (congestive heart failure) (HCC)   . Tobacco abuse counseling 10/09/2014  . Legally blind   . [facility])     Lung Cancer    Past Surgical History  Procedure Laterality Date  . Eye surgery  04/2012    detatched retna, bilateral  . Eye surgery  08/2012  . Cataract extraction      bilaterally    has DIABETES MELLITUS II, UNCOMPLICATED; HYPERLIPIDEMIA; Alcohol abuse; Memory disorder; Other specified diabetes mellitus without complications (HCC); Essential hypertension, benign; Non-small cell carcinoma of lung, stage 4 (HCC); Encounter for antineoplastic chemotherapy; Tobacco abuse counseling; and Chronic renal insufficiency on his problem list.    has No Known Allergies.      Medication List       This list is accurate as of: 01/20/15  5:16 PM.  Always use your most recent med list.               aspirin EC 81 MG tablet  Take 1 tablet by mouth daily.     benzonatate 100 MG capsule  Commonly known as:  TESSALON  Take 1 capsule (100 mg total) by mouth every 8 (eight) hours.     HYDROcodone-acetaminophen 5-325 MG tablet  Commonly known as:  NORCO/VICODIN  Take 2 tablets by mouth every 6 (six) hours as needed.     insulin NPH-regular Human (70-30) 100 UNIT/ML injection  Commonly known as:  NOVOLIN 70/30  Inject 35 Units into the skin 2 (two) times daily with a meal.     losartan-hydrochlorothiazide 100-25 MG tablet  Commonly known as:  HYZAAR  Take 1 tablet by mouth daily.     metFORMIN 1000 MG tablet  Commonly known as:  GLUCOPHAGE  Take 1 tablet (1,000 mg total) by mouth 2 (two) times daily.     nicotine 14 mg/24hr patch  Commonly known as:  NICODERM CQ  Place 1 patch (14 mg total) onto the skin daily.     prochlorperazine 10 MG tablet  Commonly known as:  COMPAZINE  Take 1 tablet (10 mg total) by mouth every 6 (six) hours as needed for nausea or vomiting.     simvastatin 40 MG tablet  Commonly known as:  ZOCOR  Take 1 tablet (40 mg total) by mouth daily.         PHYSICAL EXAMINATION  Oncology Vitals 01/20/2015 01/20/2015  Height - -  Weight - -  Weight (lbs) - -  BMI (kg/m2) - -  Temp 97.9 -  Pulse - 88  Resp - 19  SpO2 - 98  BSA (m2) - -   BP Readings from Last 2 Encounters:  01/20/15 140/96  01/20/15 126/84    Physical Exam  Constitutional: He is oriented to person, place, and time and well-developed, well-nourished, and in no distress.  HENT:  Head: Normocephalic and atraumatic.  Eyes: Right eye exhibits no discharge. Left eye exhibits no discharge. No scleral icterus.  Patient is considered legally blind; but is able to see some  Neck: Normal range of motion. Neck supple. No JVD present. No tracheal deviation  present. No thyromegaly present.  Cardiovascular: Normal rate, regular rhythm, normal heart sounds and intact distal pulses.   Pulmonary/Chest: Effort normal and breath sounds normal. No stridor. No respiratory distress. He has no wheezes. He has no rales. He exhibits no tenderness.  Abdominal: Soft. Bowel sounds are normal. He exhibits no distension and no mass. There is no tenderness. There is no rebound and no guarding.  Musculoskeletal: Normal range of motion. He exhibits no edema or tenderness.  Lymphadenopathy:    He has no cervical adenopathy.  Neurological: He is alert and oriented to person, place, and time. Gait normal.  Skin: Skin is warm and dry. No rash noted. No erythema. No pallor.  Psychiatric: Affect normal.  Nursing note and vitals reviewed.   LABORATORY DATA:. Admission on 01/20/2015, Discharged on 01/20/2015  Component Date Value Ref Range Status  . WBC 01/20/2015 10.6* 4.0 - 10.5 K/uL Final  . RBC 01/20/2015 4.86  4.22 - 5.81 MIL/uL Final  . Hemoglobin 01/20/2015 11.3* 13.0 - 17.0 g/dL Final  . HCT 01/20/2015 33.4* 39.0 - 52.0 % Final  . MCV 01/20/2015 68.7* 78.0 - 100.0 fL Final  . MCH 01/20/2015 23.3* 26.0 - 34.0 pg Final  . MCHC 01/20/2015 33.8  30.0 - 36.0 g/dL Final  . RDW 01/20/2015 14.3  11.5 - 15.5 % Final  . Platelets 01/20/2015 246  150 - 400 K/uL Final  . Neutrophils Relative % 01/20/2015 70   Final  . Lymphocytes Relative 01/20/2015 15   Final  . Monocytes Relative 01/20/2015 13   Final  . Eosinophils Relative 01/20/2015 2   Final  . Basophils Relative 01/20/2015 0   Final  . Neutro Abs 01/20/2015 7.4  1.7 - 7.7 K/uL Final  . Lymphs Abs 01/20/2015 1.6  0.7 - 4.0 K/uL Final  . Monocytes Absolute 01/20/2015 1.4* 0.1 - 1.0 K/uL Final  . Eosinophils Absolute 01/20/2015 0.2  0.0 - 0.7 K/uL Final  . Basophils Absolute 01/20/2015 0.0  0.0 - 0.1 K/uL Final  . RBC Morphology 01/20/2015 POLYCHROMASIA PRESENT   Final   TARGET CELLS  . Smear Review 01/20/2015  LARGE PLATELETS PRESENT   Final  . Sodium 01/20/2015 137  135 - 145 mmol/L Final  . Potassium 01/20/2015 4.4  3.5 - 5.1 mmol/L Final  . Chloride 01/20/2015 104  101 - 111 mmol/L Final  . CO2 01/20/2015 25  22 - 32 mmol/L Final  . Glucose, Bld 01/20/2015 156* 65 - 99 mg/dL Final  . BUN 01/20/2015 22* 6 - 20 mg/dL Final  . Creatinine, Ser 01/20/2015 1.65* 0.61 - 1.24 mg/dL Final  . Calcium 01/20/2015 9.2  8.9 - 10.3 mg/dL Final  . GFR calc non Af Amer 01/20/2015 46* >60 mL/min Final  . GFR calc Af Amer 01/20/2015 53* >60 mL/min Final   Comment: (NOTE) The eGFR has been calculated using the CKD EPI equation. This calculation has not been validated in all clinical situations. eGFR's persistently <60 mL/min signify possible Chronic Kidney Disease.   . Anion gap 01/20/2015 8  5 - 15 Final  Appointment on 01/20/2015  Component Date Value Ref Range Status  . WBC 01/20/2015 8.6  4.0 - 10.3 10e3/uL Final  . NEUT# 01/20/2015 6.4  1.5 - 6.5 10e3/uL Final  . HGB 01/20/2015 11.1* 13.0 - 17.1 g/dL Final  . HCT 01/20/2015 33.1* 38.4 - 49.9 % Final  . Platelets 01/20/2015 222  140 - 400 10e3/uL Final  . MCV 01/20/2015 68.8* 79.3 - 98.0 fL Final  . MCH 01/20/2015 23.1* 27.2 - 33.4 pg Final  . MCHC 01/20/2015 33.5  32.0 - 36.0 g/dL Final  . RBC 01/20/2015 4.81  4.20 - 5.82 10e6/uL Final  . RDW 01/20/2015 14.4  11.0 - 14.6 % Final  . lymph# 01/20/2015 1.2  0.9 - 3.3 10e3/uL Final  . MONO# 01/20/2015 0.8  0.1 - 0.9 10e3/uL Final  . Eosinophils Absolute 01/20/2015 0.1  0.0 - 0.5 10e3/uL Final  . Basophils Absolute 01/20/2015 0.0  0.0 - 0.1 10e3/uL Final  . NEUT% 01/20/2015 75.0  39.0 - 75.0 % Final  . LYMPH% 01/20/2015 14.0  14.0 - 49.0 % Final  . MONO% 01/20/2015 9.4  0.0 - 14.0 % Final  . EOS% 01/20/2015 1.4  0.0 - 7.0 % Final  . BASO% 01/20/2015 0.2  0.0 - 2.0 % Final  . nRBC 01/20/2015 0  0 - 0 % Final  . Sodium 01/20/2015 136  136 - 145 mEq/L Final  . Potassium 01/20/2015 4.6  3.5 - 5.1 mEq/L  Final  . Chloride 01/20/2015 104  98 - 109 mEq/L Final  . CO2 01/20/2015 23  22 - 29 mEq/L Final  . Glucose 01/20/2015 215* 70 - 140 mg/dl Final   Glucose reference range is for nonfasting patients. Fasting glucose reference range is 70- 100.  Marland Kitchen BUN 01/20/2015 21.2  7.0 - 26.0 mg/dL Final  . Creatinine 01/20/2015 1.8* 0.7 - 1.3 mg/dL Final  . Total Bilirubin 01/20/2015 0.32  0.20 - 1.20 mg/dL Final  . Alkaline Phosphatase 01/20/2015 151* 40 - 150 U/L Final  . AST 01/20/2015 24  5 - 34 U/L Final  . ALT 01/20/2015 16  0 - 55 U/L Final  . Total Protein 01/20/2015 7.9  6.4 - 8.3 g/dL Final  . Albumin 01/20/2015 3.6  3.5 - 5.0 g/dL Final  . Calcium 01/20/2015 9.3  8.4 - 10.4 mg/dL Final  . Anion Gap 01/20/2015 8  3 - 11 mEq/L Final  . EGFR 01/20/2015 49* >90 ml/min/1.73 m2 Final   eGFR is calculated using the CKD-EPI Creatinine Equation (2009)     RADIOGRAPHIC STUDIES: Dg Chest 2 View  01/20/2015  CLINICAL DATA:  Shortness of breath and cough, history of lung carcinoma EXAM: CHEST - 2 VIEW COMPARISON:  10/30/14 FINDINGS: Cardiac shadow is stable. The lungs are well aerated bilaterally. Mild chronic volume loss in the left lung base is noted. Nodular density is noted in the right lung apex slightly more prominent than that seen on the recent CT examination. No other focal abnormality is seen. Bilateral nipple shadows are noted. IMPRESSION: Mild chronic volume loss in the left base  stable from the prior exam. Nodular density in the right apex slightly more prominent than that seen on the prior CT. Electronically Signed   By: Inez Catalina M.D.   On: 01/20/2015 18:22   Ct Angio Chest Pe W/cm &/or Wo Cm  01/20/2015  CLINICAL DATA:  shortness of breath. [place] wanted patient to be evaluated for PE. Dx with stage 4 lung cancer. Last chemo in September eval for pe;reduced dose;GFR was 33 Lung ca dx 3/16;no xrt ;chemo completed EXAM: CT ANGIOGRAPHY CHEST WITH CONTRAST TECHNIQUE: Multidetector CT  imaging of the chest was performed using the standard protocol during bolus administration of intravenous contrast. Multiplanar CT image reconstructions and MIPs were obtained to evaluate the vascular anatomy. CONTRAST:  35mL OMNIPAQUE IOHEXOL 350 MG/ML SOLN COMPARISON:  10/30/2014 FINDINGS: Left arm IV contrast injection. Innominate vein and SVC patent. The RV is nondilated. Satisfactory opacification of pulmonary arteries noted, and there is no evidence of pulmonary emboli. 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 Adequate contrast opacification of the thoracic aorta with no evidence of dissection, aneurysm, or stenosis. There is classic 3-vessel brachiocephalic arch anatomy without proximal stenosis. Trace left pleural effusion. No pericardial effusion. Progression of confluent anterior mediastinal adenopathy, measuring approximately 7.8 x 3.5 cm maximum transverse dimensions (previously 6.4 x 3 cm). There is new left hilar adenopathy. 4.7 cm left infrahilar mass, previously 19 mm. Increase in number and size of pulmonary nodules, involving all lobes, largest 3 cm diameter in the posterior basal segment left lower lobe image 71/8. Interstitial thickening and ground-glass opacities in the left lower lobe, new since previous exam. Progressive sclerotic lesions in  sternum and manubrium. Visualized portions of upper abdomen unremarkable, although the adrenal glands are incompletely visualized. Review of the MIP images confirms the above findings. IMPRESSION: 1. Negative for acute PE or thoracic aortic dissection. 2. Progression of left infrahilar mass, left hilar and mediastinal adenopathy, and bilateral pulmonary metastatic disease as well as sternal metastases. 3. 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 Electronically Signed   By: Lucrezia Europe M.D.   On: 01/20/2015 20:15    ASSESSMENT/PLAN:    Chronic renal insufficiency  Patient has history of chronic renal insufficiency.  Creatinine has increased from 1.6 up to 1.8 today.  Patient was encouraged to push fluids is much as possible.  Will continue to monitor.  Non-small cell carcinoma of lung, stage 4 ([place])  Patient is status post 6 cycles of carboplatin/Gemzar chemotherapy.  He is currently undergoing observation only.  Patient reports mild to moderate increased shortness of breath , some occasional pain with inspiration,  Vague chest pressure , and occasional numbness /tingling/ discomfort down his left arm.    patient is considered legally blind; ambulates with assistance of a cane.  Patient states that he has been followed more recently at home.  He states that he fell over this past weekend and hit his head; but denies loss of consciousness.  He continues to smoke.   He also notes that he has been experiencing increased night sweats as well. He denies any recent fevers or chills.  On exam.-Patient with breath sounds clear bilaterally. Patient appears to have no cough or wheezes.  He appears in no acute respiratory distress.   given patient's complaints today.-Patient will be transported  to the emergency department for further evaluation and management.  Brief history.  Report was given to the emergency department charge nurse prior to patient pain transported to the emergency department via wheelchair  Per the [place].  Patient stated understanding of all instructions; and was in agreement with this plan of care. The patient knows to call the clinic with any problems, questions or concerns.   Review/collaboration with Dr. Julien Nordmann regarding all aspects of patient's visit today.   Total time spent with patient was 40 minutes;  with greater than 75 percent of that time spent in face to face counseling  regarding patient's symptoms,  and coordination of care and follow up.  Disclaimer:This dictation was prepared with Dragon/digital dictation along with [employer]. Any transcriptional errors that result from this process are unintentional.  Drue Second, NP 01/23/2015   ADDENDUM: Hematology/Oncology Attending: I had a face to face encounter with the patient. I recommended his care plan. This is a very pleasant 54 years old African-American male with a stage IV non-small cell lung cancer status post 6 cycles of systemic chemotherapy with carboplatin and gemcitabine with partial response. The patient is currently on observation has been doing fine until recently. He presented today to the [place] complaining of increasing shortness of breath as well as occasional pain on the right side when he takes deep breathing. The patient also has lack of appetite and has been losing weight. Unfortunately he continues to smoke. I strongly recommended for the patient to go immediately to the emergency department for further evaluation and to rule out any pulmonary embolism or cardiac etiology. The patient also have disease progression. We will continue to monitor his imaging studies closely and arrange for the patient a follow-up appointment to address his issues if he has evidence for disease progression. The patient and his family agreed to the current plan. He was advised to call immediately if he has any other concerning symptoms in the interval.  Disclaimer: This note was dictated with voice recognition software. Similar sounding words can inadvertently be transcribed and may be missed upon review. Eilleen Kempf., MD 01/25/2015

## 2015-01-25 NOTE — Addendum Note (Signed)
Addended by: Curt Bears on: 01/25/2015 07:54 PM   Modules accepted: Level of Service

## 2015-01-27 ENCOUNTER — Ambulatory Visit (HOSPITAL_BASED_OUTPATIENT_CLINIC_OR_DEPARTMENT_OTHER): Payer: Medicare Other | Admitting: Physician Assistant

## 2015-01-27 ENCOUNTER — Telehealth: Payer: Self-pay | Admitting: Internal Medicine

## 2015-01-27 VITALS — BP 137/84 | HR 97 | Temp 98.4°F | Resp 18 | Ht 71.0 in | Wt 187.4 lb

## 2015-01-27 DIAGNOSIS — C3490 Malignant neoplasm of unspecified part of unspecified bronchus or lung: Secondary | ICD-10-CM

## 2015-01-27 DIAGNOSIS — R5383 Other fatigue: Secondary | ICD-10-CM | POA: Diagnosis not present

## 2015-01-27 DIAGNOSIS — R110 Nausea: Secondary | ICD-10-CM | POA: Diagnosis not present

## 2015-01-27 NOTE — Progress Notes (Signed)
[place] [phone]   [phone]  OFFICE PROGRESS NOTE  [name], MD [address] [address]  DIAGNOSIS: Stage IV (T2a, N3, M1b) non-small cell lung cancer, squamous cell carcinoma diagnosed in April 2016.  PRIOR THERAPY: Systemic chemotherapy with carboplatin for AUC of 5 given on day 1 and gemcitabine 1000 MG/M2 on days 1 and 8 every 3 weeks. Status post 6 cycles with partial response.  CURRENT THERAPY: to start  Nivolumab-OPDIVO- immunotherapy due to disease progression  CODE STATUS: Full code initially but no prolonged resuscitation    INTERVAL HISTORY: Keith Holloway 54 y.o. male returns to the clinic today for follow-up visit accompanied by his wife. He is in observation. He denied having any significant chest pain but continues to have shortness breath with exertion with mild cough and no hemoptysis. He denied having any significant weight loss or night sweats. The patient denied having any fever or chills. He has no nausea or vomiting. No bleeding issues. Ambulating without difficulty.  MEDICAL HISTORY: Past Medical History  Diagnosis Date  . Diabetes mellitus without complication ([place])   . Hypertension   . High cholesterol   . Migraine   . Depression   . Arthritis   . Alcohol abuse   . Polysubstance abuse     alcohol and cocaine  . Polyneuropathy in diabetes(357.2)   . Memory disorder 08/22/2013  . CHF (congestive heart failure) ([place])   . Tobacco abuse counseling 10/09/2014  . Legally blind   . Cancer ([place])     Lung Cancer    ALLERGIES:  has No Known Allergies.  MEDICATIONS:  Current Outpatient Prescriptions  Medication Sig Dispense Refill  . aspirin EC 81 MG tablet Take 1 tablet by mouth daily.    . benzonatate (TESSALON) 100 MG capsule Take 1 capsule (100 mg total) by mouth every 8 (eight) hours. 21 capsule 0  . HYDROcodone-acetaminophen (NORCO/VICODIN) 5-325 MG tablet Take 2 tablets by mouth every  6 (six) hours as needed. (Patient taking differently: Take 2 tablets by mouth every 6 (six) hours as needed for moderate pain. ) 60 tablet 0  . insulin NPH-regular Human (NOVOLIN 70/30) (70-30) 100 UNIT/ML injection Inject 35 Units into the skin 2 (two) times daily with a meal. 10 mL 11  . losartan-hydrochlorothiazide (HYZAAR) 100-25 MG per tablet Take 1 tablet by mouth daily.  2  . metFORMIN (GLUCOPHAGE) 1000 MG tablet Take 1 tablet (1,000 mg total) by mouth 2 (two) times daily. 60 tablet 12  . nicotine (NICODERM CQ) 14 mg/24hr patch Place 1 patch (14 mg total) onto the skin daily. (Patient taking differently: Place 14 mg onto the skin daily as needed (for nicotine addiction). ) 28 patch 0  . prochlorperazine (COMPAZINE) 10 MG tablet Take 1 tablet (10 mg total) by mouth every 6 (six) hours as needed for nausea or vomiting. (Patient not taking: Reported on 10/09/2014) 30 tablet 0  . simvastatin (ZOCOR) 40 MG tablet Take 1 tablet (40 mg total) by mouth daily. 30 tablet 3   No current facility-administered medications for this visit.    SURGICAL HISTORY:  Past Surgical History  Procedure Laterality Date  . Eye surgery  04/2012    detatched retna, bilateral  . Eye surgery  08/2012  . Cataract extraction      bilaterally    REVIEW OF SYSTEMS:  Constitutional: positive for fatigue Eyes: legally blind Ears, nose, mouth, throat, and face: negative Respiratory: positive for  dyspnea on exertion Cardiovascular: negative Gastrointestinal: negative except for mild nausea controlled with anti-emetics Genitourinary:negative Integument/breast: negative Hematologic/lymphatic: negative Musculoskeletal:negative Neurological: negative Behavioral/Psych: negative Endocrine: negative Allergic/Immunologic: negative   PHYSICAL EXAMINATION: General appearance: alert, cooperative, fatigued and no distress Head: Normocephalic, without obvious abnormality, atraumatic Neck: no adenopathy, no JVD, supple,  symmetrical, trachea midline and thyroid not enlarged, symmetric, no tenderness/mass/nodules Lymph nodes: Cervical, supraclavicular, and axillary nodes normal. Resp: clear to auscultation bilaterally Back: symmetric, no curvature. ROM normal. No CVA tenderness. Cardio: regular rate and rhythm, S1, S2 normal, no murmur, click, rub or gallop GI: soft, non-tender; bowel sounds normal; no masses,  no organomegaly Extremities: extremities normal, atraumatic, no cyanosis or edema Neurologic: Alert and oriented X 3, normal strength and tone. Normal symmetric reflexes. Normal coordination and gait  ECOG PERFORMANCE STATUS: 1 - Symptomatic but completely ambulatory  Blood pressure 137/84, pulse 97, temperature 98.4 F (36.9 C), temperature source Oral, resp. rate 18, height '5\' 11"'$  (1.803 m), weight 187 lb 6.4 oz (85.004 kg), SpO2 99 %.  LABORATORY DATA: CBC Latest Ref Rng 01/20/2015 01/20/2015 10/30/2014  WBC 4.0 - 10.5 K/uL 10.6(H) 8.6 2.8(L)  Hemoglobin 13.0 - 17.0 g/dL 11.3(L) 11.1(L) 9.4(L)  Hematocrit 39.0 - 52.0 % 33.4(L) 33.1(L) 28.2(L)  Platelets 150 - 400 K/uL 246 222 220   CMP Latest Ref Rng 01/20/2015 01/20/2015 10/30/2014  Glucose 65 - 99 mg/dL 156(H) 215(H) 111  BUN 6 - 20 mg/dL 22(H) 21.2 18.2  Creatinine 0.61 - 1.24 mg/dL 1.65(H) 1.8(H) 1.6(H)  Sodium 135 - 145 mmol/L 137 136 141  Potassium 3.5 - 5.1 mmol/L 4.4 4.6 4.8  Chloride 101 - 111 mmol/L 104 - -  CO2 22 - 32 mmol/L '25 23 27  '$ Calcium 8.9 - 10.3 mg/dL 9.2 9.3 9.7  Total Protein 6.4 - 8.3 g/dL 7.9 - 8.0  Total Bilirubin 0.20 - 1.20 mg/dL 0.32 - 0.25  Alkaline Phos 40 - 150 U/L 151(H) - 108  AST 5 - 34 U/L 24 - 25  ALT 0 - 55 U/L 16 - 33      RADIOGRAPHIC STUDIES: Dg Chest 2 View  01/20/2015  CLINICAL DATA:  Shortness of breath and cough, history of lung carcinoma EXAM: CHEST - 2 VIEW COMPARISON:  10/30/14 FINDINGS: Cardiac shadow is stable. The lungs are well aerated bilaterally. Mild chronic volume loss in the left lung  base is noted. Nodular density is noted in the right lung apex slightly more prominent than that seen on the recent CT examination. No other focal abnormality is seen. Bilateral nipple shadows are noted. IMPRESSION: Mild chronic volume loss in the left base stable from the prior exam. Nodular density in the right apex slightly more prominent than that seen on the prior CT. Electronically Signed   By: Inez Catalina M.D.   On: 01/20/2015 18:22   Ct Angio Chest Pe W/cm &/or Wo Cm  01/20/2015  CLINICAL DATA:  shortness of breath. [place] [facility] wanted patient to be evaluated for PE. Dx with stage 4 lung cancer. Last chemo in September eval for pe;reduced dose;GFR was 45 Lung ca dx 3/16;no xrt ;chemo completed EXAM: CT ANGIOGRAPHY CHEST WITH CONTRAST TECHNIQUE: Multidetector CT imaging of the chest was performed using the standard protocol during bolus administration of intravenous contrast. Multiplanar CT image reconstructions and MIPs were obtained to evaluate the vascular anatomy. CONTRAST:  105m OMNIPAQUE IOHEXOL 350 MG/ML SOLN COMPARISON:  10/30/2014 FINDINGS: Left arm IV contrast injection. Innominate vein and SVC patent. The RV is nondilated. Satisfactory  opacification of pulmonary arteries noted, and there is no evidence of pulmonary emboli. 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 Adequate contrast opacification of the thoracic aorta with no evidence of dissection, aneurysm, or stenosis. There is classic 3-vessel brachiocephalic arch anatomy without proximal stenosis. Trace left pleural effusion. No pericardial effusion. Progression of confluent anterior mediastinal adenopathy, measuring approximately 7.8 x 3.5 cm maximum transverse dimensions (previously 6.4 x 3 cm). There is new left hilar adenopathy. 4.7 cm left infrahilar mass, previously 19 mm. Increase in number and size of pulmonary nodules, involving all lobes, largest 3 cm diameter in the posterior basal  segment left lower lobe image 71/8. Interstitial thickening and ground-glass opacities in the left lower lobe, new since previous exam. Progressive sclerotic lesions in  sternum and manubrium. Visualized portions of upper abdomen unremarkable, although the adrenal glands are incompletely visualized. Review of the MIP images confirms the above findings. IMPRESSION: 1. Negative for acute PE or thoracic aortic dissection. 2. Progression of left infrahilar mass, left hilar and mediastinal adenopathy, and bilateral pulmonary metastatic disease as well as sternal metastases. 3. Interval encasement and occlusion of the inferior left pulmonary vein which may account for the interstitial changes in the basilar segments of the left lower lobe although lymphangitic spread of tumor can have a similar appearance. Electronically Signed   By: Lucrezia Europe M.D.   On: 01/20/2015 20:15    ASSESSMENT AND PLAN: This is a very pleasant 54 years old African-American male with a stage IV non-small cell lung cancer, squamous cell carcinoma who completed systemic chemotherapy with carboplatin and gemcitabine status post 6 cycles. The patient has tolerated his treatment fairly well with no significant adverse effects except for mild fatigue and nausea. The recent CT scan of the chest, abdomen and pelvis showed disease progression of the left infrahilar mass, left hilar and mediastinal adenopathy, pulmonary bilateral mets and sternal metastases, possible lymphangitic spread of tumor. I discussed the scan results with the patient and his family.  I recommended for him to start immunotherapy in the form Nivolumab (Opdivo) every 2 weeks to control his disease. Side effects, including diarrhea, rash or inflammation have been discussed with him and he agreed to proceed.  A Port-A-Cath will be placed this week in anticipation of initiation of the planned therapy next week. He will be seen by Dr. Julien Nordmann in one week with labs prior to the  treatment. The patient knows to call if he has any questions or concerns in the interim.  For smoke cessation, I started the patient on Nicoderm 14 mg/24 hour.  CODE STATUS was discussed with the patient and he would like to continue with full code but no prolonged resuscitation. He was advised to call immediately if he has any concerning symptoms in the interval.  The patient voices understanding of current disease status and treatment options and is in agreement with the current care plan.  All questions were answered. The patient knows to call the clinic with any problems, questions or concerns. We can certainly see the patient much sooner if necessary.  ADDENDUM: Hematology/Oncology Attending: I had a face to face encounter with the patient today. I recommended his care plan. This is a very pleasant 54 years old African-American male with stage IV non-small cell lung cancer, squamous cell carcinoma status post 6 cycles of systemic chemotherapy with carbo platinum and gemcitabine with partial response. Unfortunately the patient was found recently to have evidence for disease progression of the  mediastinal lymphadenopathy as well as the bilateral pulmonary nodules and questionable lymphangitic spread of tumor. I had a lengthy discussion with the patient and his family today about his current disease status and treatment options. The patient was given the option of palliative care versus consideration of treatment with immunotherapy with Nivolumab 240 mg IV every 2 weeks. He is interested in proceeding with immunotherapy. I discussed with the patient adverse effect of this treatment including but not limited to immune mediated pneumonitis, diarrhea, skin rash, liver, renal, thyroid or other endocrine dysfunction. He is expected to start the first dose of this treatment next week. The patient would come back for follow-up visit in 3 weeks for reevaluation with the start of cycle #2. We will also  arrange for the patient to have a Port-A-Cath placed by interventional radiology. For his first treatment. The patient was advised to call immediately if he has any concerning symptoms in the interval.  Disclaimer: This note was dictated with voice recognition software. Similar sounding words can inadvertently be transcribed and may be missed upon review. Eilleen Kempf., MD 01/27/2015

## 2015-01-27 NOTE — Telephone Encounter (Signed)
Gave patient avs report and appointments for December and January including port placement. Patient seen by SW and pof sent by SW. Per MM patient should be seen 3 weeks from today and have lab every 2 week with tx. Also per MM cxd existing lab/ct - MM for dec 12/28.

## 2015-01-29 ENCOUNTER — Other Ambulatory Visit: Payer: Self-pay | Admitting: Radiology

## 2015-01-30 ENCOUNTER — Ambulatory Visit (HOSPITAL_COMMUNITY)
Admission: RE | Admit: 2015-01-30 | Discharge: 2015-01-30 | Disposition: A | Discharge status: Home / Self Care | Payer: Medicare Other | Source: Ambulatory Visit | Attending: Physician Assistant | Admitting: Physician Assistant

## 2015-01-30 ENCOUNTER — Other Ambulatory Visit: Payer: Self-pay | Admitting: Physician Assistant

## 2015-01-30 ENCOUNTER — Encounter (HOSPITAL_COMMUNITY): Payer: Self-pay

## 2015-01-30 DIAGNOSIS — C771 Secondary and unspecified malignant neoplasm of intrathoracic lymph nodes: Secondary | ICD-10-CM | POA: Diagnosis not present

## 2015-01-30 DIAGNOSIS — Z9221 Personal history of antineoplastic chemotherapy: Secondary | ICD-10-CM | POA: Diagnosis not present

## 2015-01-30 DIAGNOSIS — C3490 Malignant neoplasm of unspecified part of unspecified bronchus or lung: Secondary | ICD-10-CM

## 2015-01-30 DIAGNOSIS — M1990 Unspecified osteoarthritis, unspecified site: Secondary | ICD-10-CM | POA: Diagnosis not present

## 2015-01-30 DIAGNOSIS — I509 Heart failure, unspecified: Secondary | ICD-10-CM | POA: Diagnosis not present

## 2015-01-30 DIAGNOSIS — Z87891 Personal history of nicotine dependence: Secondary | ICD-10-CM | POA: Insufficient documentation

## 2015-01-30 DIAGNOSIS — F329 Major depressive disorder, single episode, unspecified: Secondary | ICD-10-CM | POA: Insufficient documentation

## 2015-01-30 DIAGNOSIS — F1410 Cocaine abuse, uncomplicated: Secondary | ICD-10-CM | POA: Diagnosis not present

## 2015-01-30 DIAGNOSIS — E1142 Type 2 diabetes mellitus with diabetic polyneuropathy: Secondary | ICD-10-CM | POA: Diagnosis not present

## 2015-01-30 DIAGNOSIS — Z7982 Long term (current) use of aspirin: Secondary | ICD-10-CM | POA: Insufficient documentation

## 2015-01-30 DIAGNOSIS — I10 Essential (primary) hypertension: Secondary | ICD-10-CM | POA: Diagnosis not present

## 2015-01-30 DIAGNOSIS — Z8249 Family history of ischemic heart disease and other diseases of the circulatory system: Secondary | ICD-10-CM | POA: Diagnosis not present

## 2015-01-30 DIAGNOSIS — Z7984 Long term (current) use of oral hypoglycemic drugs: Secondary | ICD-10-CM | POA: Insufficient documentation

## 2015-01-30 DIAGNOSIS — Z794 Long term (current) use of insulin: Secondary | ICD-10-CM | POA: Insufficient documentation

## 2015-01-30 DIAGNOSIS — Z79899 Other long term (current) drug therapy: Secondary | ICD-10-CM | POA: Diagnosis not present

## 2015-01-30 DIAGNOSIS — H548 Legal blindness, as defined in USA: Secondary | ICD-10-CM | POA: Diagnosis not present

## 2015-01-30 DIAGNOSIS — R918 Other nonspecific abnormal finding of lung field: Secondary | ICD-10-CM | POA: Insufficient documentation

## 2015-01-30 DIAGNOSIS — F1010 Alcohol abuse, uncomplicated: Secondary | ICD-10-CM | POA: Diagnosis not present

## 2015-01-30 DIAGNOSIS — G43909 Migraine, unspecified, not intractable, without status migrainosus: Secondary | ICD-10-CM | POA: Insufficient documentation

## 2015-01-30 DIAGNOSIS — E7800 Pure hypercholesterolemia, unspecified: Secondary | ICD-10-CM | POA: Insufficient documentation

## 2015-01-30 HISTORY — PX: PORTACATH PLACEMENT: SHX2246

## 2015-01-30 LAB — BASIC METABOLIC PANEL
Anion gap: 9 (ref 5–15)
BUN: 15 mg/dL (ref 6–20)
CHLORIDE: 106 mmol/L (ref 101–111)
CO2: 27 mmol/L (ref 22–32)
CREATININE: 1.43 mg/dL — AB (ref 0.61–1.24)
Calcium: 9.7 mg/dL (ref 8.9–10.3)
GFR calc Af Amer: >60 mL/min (ref >60)
GFR calc non Af Amer: 54 mL/min — ABNORMAL LOW (ref >60)
Glucose, Bld: 109 mg/dL — ABNORMAL HIGH (ref 65–99)
Potassium: 4.4 mmol/L (ref 3.5–5.1)
Sodium: 142 mmol/L (ref 135–145)

## 2015-01-30 LAB — PROTIME-INR
INR: 1.08 (ref 0.00–1.49)
Prothrombin Time: 14.2 seconds (ref 11.6–15.2)

## 2015-01-30 LAB — CBC
HCT: 32.7 % — ABNORMAL LOW (ref 39.0–52.0)
Hemoglobin: 10.8 g/dL — ABNORMAL LOW (ref 13.0–17.0)
MCH: 22.9 pg — AB (ref 26.0–34.0)
MCHC: 33 g/dL (ref 30.0–36.0)
MCV: 69.3 fL — AB (ref 78.0–100.0)
PLATELETS: 308 10*3/uL (ref 150–400)
RBC: 4.72 MIL/uL (ref 4.22–5.81)
RDW: 14.4 % (ref 11.5–15.5)
WBC: 9.1 10*3/uL (ref 4.0–10.5)

## 2015-01-30 LAB — GLUCOSE, CAPILLARY: Glucose-Capillary: 101 mg/dL — ABNORMAL HIGH (ref 65–99)

## 2015-01-30 LAB — APTT: aPTT: 35 seconds (ref 24–37)

## 2015-01-30 MED ORDER — HEPARIN SOD (PORK) LOCK FLUSH 100 UNIT/ML IV SOLN
INTRAVENOUS | Status: AC
  Filled 2015-01-30: qty 5

## 2015-01-30 MED ORDER — FENTANYL CITRATE (PF) 100 MCG/2ML IJ SOLN
INTRAMUSCULAR | Status: AC
  Filled 2015-01-30: qty 4

## 2015-01-30 MED ORDER — LIDOCAINE-EPINEPHRINE 2 %-1:100000 IJ SOLN
INTRAMUSCULAR | Status: AC
  Filled 2015-01-30: qty 1

## 2015-01-30 MED ORDER — CEFAZOLIN SODIUM-DEXTROSE 2-3 GM-% IV SOLR
2.0000 g | INTRAVENOUS | Status: AC
  Administered 2015-01-30: 2 g via INTRAVENOUS

## 2015-01-30 MED ORDER — LIDOCAINE HCL 1 % IJ SOLN
INTRAMUSCULAR | Status: DC
Start: 2015-01-30 — End: 2015-01-31
  Filled 2015-01-30: qty 20

## 2015-01-30 MED ORDER — SODIUM CHLORIDE 0.9 % IV SOLN
Freq: Once | INTRAVENOUS | Status: AC
  Administered 2015-01-30: 14:00:00 via INTRAVENOUS

## 2015-01-30 MED ORDER — HEPARIN SOD (PORK) LOCK FLUSH 100 UNIT/ML IV SOLN
INTRAVENOUS | Status: AC | PRN
  Administered 2015-01-30: 500 [IU]

## 2015-01-30 MED ORDER — FENTANYL CITRATE (PF) 100 MCG/2ML IJ SOLN
INTRAMUSCULAR | Status: AC | PRN
  Administered 2015-01-30: 50 ug via INTRAVENOUS

## 2015-01-30 MED ORDER — MIDAZOLAM HCL 2 MG/2ML IJ SOLN
INTRAMUSCULAR | Status: AC | PRN
  Administered 2015-01-30 (×3): 1 mg via INTRAVENOUS

## 2015-01-30 MED ORDER — CEFAZOLIN SODIUM-DEXTROSE 2-3 GM-% IV SOLR
INTRAVENOUS | Status: AC
  Administered 2015-01-30: 2 g via INTRAVENOUS
  Filled 2015-01-30: qty 50

## 2015-01-30 MED ORDER — MIDAZOLAM HCL 2 MG/2ML IJ SOLN
INTRAMUSCULAR | Status: AC
  Filled 2015-01-30: qty 6

## 2015-01-30 NOTE — H&P (Signed)
Chief Complaint: Patient was seen in consultation today for Port-A-Cath placement    Referring Physician(s): Mohamed,M  History of Present Illness: Keith Holloway is a 54 y.o. male with stage IV non-small cell lung cancer, squamous cell carcinoma status post 6 cycles of systemic chemotherapy with carbo platinum and gemcitabine with partial response. Unfortunately the patient was found recently to have evidence for disease progression of the mediastinal lymphadenopathy as well as the bilateral pulmonary nodules and questionable lymphangitic spread of tumor. He presents today for Port-A-Cath placement for immunotherapy treatment. He is legally blind.  Past Medical History  Diagnosis Date  . Diabetes mellitus without complication ([place])   . Hypertension   . High cholesterol   . Migraine   . Depression   . Arthritis   . Alcohol abuse   . Polysubstance abuse     alcohol and cocaine  . Polyneuropathy in diabetes(357.2)   . Memory disorder 08/22/2013  . CHF (congestive heart failure) ([place])   . Tobacco abuse counseling 10/09/2014  . Legally blind   . Cancer [facility])     Lung Cancer    Past Surgical History  Procedure Laterality Date  . Eye surgery  04/2012    detatched retna, bilateral  . Eye surgery  08/2012  . Cataract extraction      bilaterally    Allergies: Review of patient's allergies indicates no known allergies.  Medications: Prior to Admission medications   Medication Sig Start Date End Date Taking? Authorizing Provider  aspirin EC 81 MG tablet Take 1 tablet by mouth daily.    Historical Provider, MD  benzonatate (TESSALON) 100 MG capsule Take 1 capsule (100 mg total) by mouth every 8 (eight) hours. 01/20/15   Leo Grosser, MD  HYDROcodone-acetaminophen (NORCO/VICODIN) 5-325 MG tablet Take 2 tablets by mouth every 6 (six) hours as needed. Patient taking differently: Take 2 tablets by mouth every 6 (six) hours as needed for moderate pain.  11/25/14   Curt Bears, MD  insulin NPH-regular Human (NOVOLIN 70/30) (70-30) 100 UNIT/ML injection Inject 35 Units into the skin 2 (two) times daily with a meal. 01/23/14   Lorayne Marek, MD  losartan-hydrochlorothiazide (HYZAAR) 100-25 MG per tablet Take 1 tablet by mouth daily. 10/23/14   Historical Provider, MD  metFORMIN (GLUCOPHAGE) 1000 MG tablet Take 1 tablet (1,000 mg total) by mouth 2 (two) times daily. 01/23/14   Lorayne Marek, MD  nicotine (NICODERM CQ) 14 mg/24hr patch Place 1 patch (14 mg total) onto the skin daily. Patient taking differently: Place 14 mg onto the skin daily as needed (for nicotine addiction).  10/09/14   Curt Bears, MD  prochlorperazine (COMPAZINE) 10 MG tablet Take 1 tablet (10 mg total) by mouth every 6 (six) hours as needed for nausea or vomiting. Patient not taking: Reported on 10/09/2014 06/13/14   Curt Bears, MD  simvastatin (ZOCOR) 40 MG tablet Take 1 tablet (40 mg total) by mouth daily. 01/23/14   Lorayne Marek, MD     Family History  Problem Relation Age of Onset  . Alzheimer's disease Brother   . Cancer Father   . Hypertension Father   . Heart attack Father   . Hypertension Mother   . Gout Mother   . Diabetes Sister   . Hypertension Sister   . Hypertension Sister   . Hypertension Sister     Social History   Social History  . Marital Status: Married    Spouse Name: Linton Rump  . Number of Children: 0  .  Years of Education: 11   Occupational History  . Disabled    Social History Main Topics  . Smoking status: Former Smoker -- 0.50 packs/day    Types: Cigarettes    Quit date: 04/16/2011  . Smokeless tobacco: Never Used     Comment: just recently began to smoke again after quitting in 2013, 3 cigarettes a week  . Alcohol Use: 1.2 oz/week    2 Cans of beer per week     Comment: states occassionally drinks beer  . Drug Use: No  . Sexual Activity: Not on file   Other Topics Concern  . Not on file   Social History Narrative   Patient is married  Linton Rump) and lives at home with his wife.   Patient is disabled.   Patient has a high school education.   Patient is right-handed.   Patient drinks 3-4 cups of coffee most days.     Review of Systems patient currently denies fever, headache, chest pain, abdominal/back pain, nausea/ vomiting or abnormal bleeding. He does have dyspnea with exertion and occasional cough. He is legally blind.  Vital Signs: BP 151/87 mmHg  Pulse 87  Temp(Src) 98 F (36.7 C) (Oral)  Resp 18  Ht '5\' 11"'$  (1.803 m)  Wt 187 lb (84.823 kg)  BMI 26.09 kg/m2  SpO2 100%  Physical Exam awake, alert. Legally blind. Chest with diminished breath sounds on left, right clear. Heart with regular rate and rhythm. Abdomen soft, positive bowel sounds, nontender. Extremities -full range of motion and no LE edema.    Mallampati Score:     Imaging: Dg Chest 2 View  01/20/2015  CLINICAL DATA:  Shortness of breath and cough, history of lung carcinoma EXAM: CHEST - 2 VIEW COMPARISON:  10/30/14 FINDINGS: Cardiac shadow is stable. The lungs are well aerated bilaterally. Mild chronic volume loss in the left lung base is noted. Nodular density is noted in the right lung apex slightly more prominent than that seen on the recent CT examination. No other focal abnormality is seen. Bilateral nipple shadows are noted. IMPRESSION: Mild chronic volume loss in the left base stable from the prior exam. Nodular density in the right apex slightly more prominent than that seen on the prior CT. Electronically Signed   By: Inez Catalina M.D.   On: 01/20/2015 18:22   Ct Angio Chest Pe W/cm &/or Wo Cm  01/20/2015  CLINICAL DATA:  shortness of breath. [place] wanted patient to be evaluated for PE. Dx with stage 4 lung cancer. Last chemo in September eval for pe;reduced dose;GFR was 26 Lung ca dx 3/16;no xrt ;chemo completed EXAM: CT ANGIOGRAPHY CHEST WITH CONTRAST TECHNIQUE: Multidetector CT imaging of the chest was performed using the standard  protocol during bolus administration of intravenous contrast. Multiplanar CT image reconstructions and MIPs were obtained to evaluate the vascular anatomy. CONTRAST:  26m OMNIPAQUE IOHEXOL 350 MG/ML SOLN COMPARISON:  10/30/2014 FINDINGS: Left arm IV contrast injection. Innominate vein and SVC patent. The RV is nondilated. Satisfactory opacification of pulmonary arteries noted, and there is no evidence of pulmonary emboli. 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 Adequate contrast opacification of the thoracic aorta with no evidence of dissection, aneurysm, or stenosis. There is classic 3-vessel brachiocephalic arch anatomy without proximal stenosis. Trace left pleural effusion. No pericardial effusion. Progression of confluent anterior mediastinal adenopathy, measuring approximately 7.8 x 3.5 cm maximum transverse dimensions (previously 6.4 x 3 cm). There is new left hilar adenopathy. 4.7  cm left infrahilar mass, previously 19 mm. Increase in number and size of pulmonary nodules, involving all lobes, largest 3 cm diameter in the posterior basal segment left lower lobe image 71/8. Interstitial thickening and ground-glass opacities in the left lower lobe, new since previous exam. Progressive sclerotic lesions in  sternum and manubrium. Visualized portions of upper abdomen unremarkable, although the adrenal glands are incompletely visualized. Review of the MIP images confirms the above findings. IMPRESSION: 1. Negative for acute PE or thoracic aortic dissection. 2. Progression of left infrahilar mass, left hilar and mediastinal adenopathy, and bilateral pulmonary metastatic disease as well as sternal metastases. 3. Interval encasement and occlusion of the inferior left pulmonary vein which may account for the interstitial changes in the basilar segments of the left lower lobe although lymphangitic spread of tumor can have a similar appearance. Electronically Signed   By: Lucrezia Europe M.D.   On: 01/20/2015 20:15    Labs:  CBC:  Recent Labs  10/16/14 0852 10/30/14 1011 01/20/15 1442 01/20/15 1746  WBC 2.1* 2.8* 8.6 10.6*  HGB 10.5* 9.4* 11.1* 11.3*  HCT 32.6* 28.2* 33.1* 33.4*  PLT 163 220 222 246    COAGS: No results for input(s): INR, APTT in the last 8760 hours.  BMP:  Recent Labs  05/05/14 2010  10/16/14 0852 10/30/14 1011 01/20/15 1442 01/20/15 1746  NA 136  < > 139 141 136 137  K 4.0  < > 4.9 4.8 4.6 4.4  CL 102  --   --   --   --  104  CO2 28  < > '24 27 23 25  '$ GLUCOSE 184*  < > 141* 111 215* 156*  BUN 14  < > 15.9 18.2 21.2 22*  CALCIUM 9.4  < > 9.8 9.7 9.3 9.2  CREATININE 1.26  < > 1.6* 1.6* 1.8* 1.65*  GFRNONAA 63*  --   --   --   --  46*  GFRAA 73*  --   --   --   --  53*  < > = values in this interval not displayed.  LIVER FUNCTION TESTS:  Recent Labs  10/09/14 0819 10/16/14 0852 10/30/14 1011 01/20/15 1442  BILITOT 0.31 0.32 0.25 0.32  AST 23 33 25 24  ALT 27 48 33 16  ALKPHOS 135 125 108 151*  PROT 8.5* 8.5* 8.0 7.9  ALBUMIN 4.1 4.1 3.9 3.6    TUMOR MARKERS: No results for input(s): AFPTM, CEA, CA199, CHROMGRNA in the last 8760 hours.  Assessment and Plan: Patient with history of stage IV non-small cell lung cancer, squamous cell carcinoma diagnosed 05/2014, status post prior chemotherapy and now with evidence of disease progression. He presents today for Port-A-Cath placement for immunotherapy.Risks and benefits discussed with the patient/family including, but not limited to bleeding, infection, pneumothorax, or fibrin sheath development and need for additional procedures. All of the patient's questions were answered, patient is agreeable to proceed.Consent signed and in chart.     Thank you for this interesting consult.  I greatly enjoyed meeting Keith Holloway and look forward to participating in their care.  A copy of this report was sent to the requesting provider on this date.  Signed: D. Rowe Zephaniah 01/30/2015, 1:17 PM   I spent a total of 15 minutes in face to face in clinical consultation, greater than 50% of which was counseling/coordinating care for Port-A-Cath placement

## 2015-01-30 NOTE — Procedures (Signed)
Interventional Radiology Procedure Note  Procedure:  Portacath  Complications:  None  Estimated Blood Loss: < 25 mL  Right IJ port with catheter tip at cavoatrial junction.  OK to use.  Venetia Night. Kathlene Cote, M.D Pager:  [phone]

## 2015-01-30 NOTE — Discharge Instructions (Signed)
Implanted Port Insertion, Care After °Refer to this sheet in the next few weeks. These instructions provide you with information on caring for yourself after your procedure. Your health care provider may also give you more specific instructions. Your treatment has been planned according to current medical practices, but problems sometimes occur. Call your health care provider if you have any problems or questions after your procedure. °WHAT TO EXPECT AFTER THE PROCEDURE °After your procedure, it is typical to have the following:  °· Discomfort at the port insertion site. Ice packs to the area will help. °· Bruising on the skin over the port. This will subside in 3-4 days. °HOME CARE INSTRUCTIONS °· After your port is placed, you will get a manufacturer's information card. The card has information about your port. Keep this card with you at all times.   °· Know what kind of port you have. There are many types of ports available.   °· Wear a medical alert bracelet in case of an emergency. This can help alert health care workers that you have a port.   °· The port can stay in for as long as your health care provider believes it is necessary.   °· A home health care nurse may give medicines and take care of the port.   °· You or a family member can get special training and directions for giving medicine and taking care of the port at home.   °SEEK MEDICAL CARE IF:  °· Your port does not flush or you are unable to get a blood return.   °· You have a fever or chills. °SEEK IMMEDIATE MEDICAL CARE IF: °· You have new fluid or pus coming from your incision.   °· You notice a bad smell coming from your incision site.   °· You have swelling, pain, or more redness at the incision or port site.   °· You have chest pain or shortness of breath. °  °This information is not intended to replace advice given to you by your health care provider. Make sure you discuss any questions you have with your health care provider. °  °Document  Released: 11/22/2012 Document Revised: 02/06/2013 Document Reviewed: 11/22/2012 °Elsevier Interactive Patient Education ©2016 [employer] °Implanted Port Home Guide °An implanted port is a type of central line that is placed under the skin. Central lines are used to provide IV access when treatment or nutrition needs to be given through a person's veins. Implanted ports are used for long-term IV access. An implanted port may be placed because:  °· You need IV medicine that would be irritating to the small veins in your hands or arms.   °· You need long-term IV medicines, such as antibiotics.   °· You need IV nutrition for a long period.   °· You need frequent blood draws for lab tests.   °· You need dialysis.   °Implanted ports are usually placed in the chest area, but they can also be placed in the upper arm, the abdomen, or the leg. An implanted port has two main parts:  °· Reservoir. The reservoir is round and will appear as a small, raised area under your skin. The reservoir is the part where a needle is inserted to give medicines or draw blood.   °· Catheter. The catheter is a thin, flexible tube that extends from the reservoir. The catheter is placed into a large vein. Medicine that is inserted into the reservoir goes into the catheter and then into the vein.   °HOW WILL I CARE FOR MY INCISION SITE? °Do not get the   incision site wet. Bathe or shower as directed by your health care provider.  °HOW IS MY PORT ACCESSED? °Special steps must be taken to access the port:  °· Before the port is accessed, a numbing cream can be placed on the skin. This helps numb the skin over the port site.   °· Your health care provider uses a sterile technique to access the port. °· Your health care provider must put on a mask and sterile gloves. °· The skin over your port is cleaned carefully with an antiseptic and allowed to dry. °· The port is gently pinched between sterile gloves, and a needle is inserted into the  port. °· Only "non-coring" port needles should be used to access the port. Once the port is accessed, a blood return should be checked. This helps ensure that the port is in the vein and is not clogged.   °· If your port needs to remain accessed for a constant infusion, a clear (transparent) bandage will be placed over the needle site. The bandage and needle will need to be changed every week, or as directed by your health care provider.   °· Keep the bandage covering the needle clean and dry. Do not get it wet. Follow your health care provider's instructions on how to take a shower or bath while the port is accessed.   °· If your port does not need to stay accessed, no bandage is needed over the port.   °WHAT IS FLUSHING? °Flushing helps keep the port from getting clogged. Follow your health care provider's instructions on how and when to flush the port. Ports are usually flushed with saline solution or a medicine called heparin. The need for flushing will depend on how the port is used.  °· If the port is used for intermittent medicines or blood draws, the port will need to be flushed:   °· After medicines have been given.   °· After blood has been drawn.   °· As part of routine maintenance.   °· If a constant infusion is running, the port may not need to be flushed.   °HOW LONG WILL MY PORT STAY IMPLANTED? °The port can stay in for as long as your health care provider thinks it is needed. When it is time for the port to come out, surgery will be done to remove it. The procedure is similar to the one performed when the port was put in.  °WHEN SHOULD I SEEK IMMEDIATE MEDICAL CARE? °When you have an implanted port, you should seek immediate medical care if:  °· You notice a bad smell coming from the incision site.   °· You have swelling, redness, or drainage at the incision site.   °· You have more swelling or pain at the port site or the surrounding area.   °· You have a fever that is not controlled with  medicine. °  °This information is not intended to replace advice given to you by your health care provider. Make sure you discuss any questions you have with your health care provider. °  °Document Released: 02/01/2005 Document Revised: 11/22/2012 Document Reviewed: 10/09/2012 °[employer]vier Interactive Patient Education ©2016 [employer] ° ° °Moderate Conscious Sedation, Adult, Care After °Refer to this sheet in the next few weeks. These instructions provide you with information on caring for yourself after your procedure. Your health care provider may also give you more specific instructions. Your treatment has been planned according to current medical practices, but problems sometimes occur. Call your health care provider if you have any problems or questions   after your procedure. °WHAT TO EXPECT AFTER THE PROCEDURE  °After your procedure: °· You may feel sleepy, clumsy, and have poor balance for several hours. °· Vomiting may occur if you eat too soon after the procedure. °HOME CARE INSTRUCTIONS °· Do not participate in any activities where you could become injured for at least 24 hours. Do not: °¨ Drive. °¨ Swim. °¨ Ride a bicycle. °¨ Operate heavy machinery. °¨ Cook. °¨ Use power tools. °¨ Climb ladders. °¨ Work from a high place. °· Do not make important decisions or sign legal documents until you are improved. °· If you vomit, drink water, juice, or soup when you can drink without vomiting. Make sure you have little or no nausea before eating solid foods. °· Only take over-the-counter or prescription medicines for pain, discomfort, or fever as directed by your health care provider. °· Make sure you and your family fully understand everything about the medicines given to you, including what side effects may occur. °· You should not drink alcohol, take sleeping pills, or take medicines that cause drowsiness for at least 24 hours. °· If you smoke, do not smoke without supervision. °· If you are feeling better, you  may resume normal activities 24 hours after you were sedated. °· Keep all appointments with your health care provider. °SEEK MEDICAL CARE IF: °· Your skin is pale or bluish in color. °· You continue to feel nauseous or vomit. °· Your pain is getting worse and is not helped by medicine. °· You have bleeding or swelling. °· You are still sleepy or feeling clumsy after 24 hours. °SEEK IMMEDIATE MEDICAL CARE IF: °· You develop a rash. °· You have difficulty breathing. °· You develop any type of allergic problem. °· You have a fever. °MAKE SURE YOU: °· Understand these instructions. °· Will watch your condition. °· Will get help right away if you are not doing well or get worse. °  °This information is not intended to replace advice given to you by your health care provider. Make sure you discuss any questions you have with your health care provider. °  °Document Released: 11/22/2012 Document Revised: 02/22/2014 Document Reviewed: 11/22/2012 °Elsevier Interactive Patient Education ©2016 [employer] ° °

## 2015-02-03 ENCOUNTER — Other Ambulatory Visit: Payer: Self-pay | Admitting: *Deleted

## 2015-02-03 ENCOUNTER — Ambulatory Visit (HOSPITAL_BASED_OUTPATIENT_CLINIC_OR_DEPARTMENT_OTHER): Payer: Medicare Other

## 2015-02-03 ENCOUNTER — Ambulatory Visit: Payer: Medicare Other | Admitting: Internal Medicine

## 2015-02-03 ENCOUNTER — Other Ambulatory Visit (HOSPITAL_BASED_OUTPATIENT_CLINIC_OR_DEPARTMENT_OTHER): Payer: Medicare Other

## 2015-02-03 VITALS — BP 160/89 | HR 92 | Temp 98.9°F | Resp 18

## 2015-02-03 DIAGNOSIS — C3490 Malignant neoplasm of unspecified part of unspecified bronchus or lung: Secondary | ICD-10-CM | POA: Diagnosis present

## 2015-02-03 DIAGNOSIS — C3492 Malignant neoplasm of unspecified part of left bronchus or lung: Secondary | ICD-10-CM

## 2015-02-03 DIAGNOSIS — Z5112 Encounter for antineoplastic immunotherapy: Secondary | ICD-10-CM | POA: Diagnosis not present

## 2015-02-03 DIAGNOSIS — C3491 Malignant neoplasm of unspecified part of right bronchus or lung: Secondary | ICD-10-CM

## 2015-02-03 LAB — COMPREHENSIVE METABOLIC PANEL
ALT: 18 U/L (ref 0–55)
ANION GAP: 12 meq/L — AB (ref 3–11)
AST: 21 U/L (ref 5–34)
Albumin: 3.9 g/dL (ref 3.5–5.0)
Alkaline Phosphatase: 139 U/L (ref 40–150)
BUN: 21.4 mg/dL (ref 7.0–26.0)
CALCIUM: 9.8 mg/dL (ref 8.4–10.4)
CHLORIDE: 101 meq/L (ref 98–109)
CO2: 25 meq/L (ref 22–29)
Creatinine: 1.6 mg/dL — ABNORMAL HIGH (ref 0.7–1.3)
EGFR: 57 mL/min/{1.73_m2} — ABNORMAL LOW (ref >90)
Glucose: 136 mg/dl (ref 70–140)
POTASSIUM: 4.1 meq/L (ref 3.5–5.1)
Sodium: 137 mEq/L (ref 136–145)
Total Bilirubin: <0.3 mg/dL (ref 0.20–1.20)
Total Protein: 8.6 g/dL — ABNORMAL HIGH (ref 6.4–8.3)

## 2015-02-03 LAB — CBC WITH DIFFERENTIAL/PLATELET
BASO%: 0.2 % (ref 0.0–2.0)
BASOS ABS: 0 10*3/uL (ref 0.0–0.1)
EOS%: 0.8 % (ref 0.0–7.0)
Eosinophils Absolute: 0.1 10*3/uL (ref 0.0–0.5)
HEMATOCRIT: 33.3 % — AB (ref 38.4–49.9)
HGB: 11.1 g/dL — ABNORMAL LOW (ref 13.0–17.1)
LYMPH#: 1.2 10*3/uL (ref 0.9–3.3)
LYMPH%: 10 % — AB (ref 14.0–49.0)
MCH: 22.8 pg — AB (ref 27.2–33.4)
MCHC: 33.3 g/dL (ref 32.0–36.0)
MCV: 68.5 fL — ABNORMAL LOW (ref 79.3–98.0)
MONO#: 1.1 10*3/uL — ABNORMAL HIGH (ref 0.1–0.9)
MONO%: 8.7 % (ref 0.0–14.0)
NEUT#: 9.6 10*3/uL — ABNORMAL HIGH (ref 1.5–6.5)
NEUT%: 80.3 % — AB (ref 39.0–75.0)
Platelets: 296 10*3/uL (ref 140–400)
RBC: 4.86 10*6/uL (ref 4.20–5.82)
RDW: 14.4 % (ref 11.0–14.6)
WBC: 12 10*3/uL — ABNORMAL HIGH (ref 4.0–10.3)

## 2015-02-03 MED ORDER — SODIUM CHLORIDE 0.9 % IV SOLN
240.0000 mg | Freq: Once | INTRAVENOUS | Status: AC
  Administered 2015-02-03: 240 mg via INTRAVENOUS
  Filled 2015-02-03: qty 24

## 2015-02-03 MED ORDER — SODIUM CHLORIDE 0.9 % IV SOLN
Freq: Once | INTRAVENOUS | Status: AC
  Administered 2015-02-03: 10:00:00 via INTRAVENOUS

## 2015-02-03 MED ORDER — HEPARIN SOD (PORK) LOCK FLUSH 100 UNIT/ML IV SOLN
500.0000 [IU] | Freq: Once | INTRAVENOUS | Status: AC | PRN
  Administered 2015-02-03: 500 [IU]
  Filled 2015-02-03: qty 5

## 2015-02-03 MED ORDER — LIDOCAINE-PRILOCAINE 2.5-2.5 % EX CREA: 1.0000 "application " | TOPICAL_CREAM | CUTANEOUS | Status: DC | PRN

## 2015-02-03 MED ORDER — SODIUM CHLORIDE 0.9 % IJ SOLN
10.0000 mL | INTRAMUSCULAR | Status: DC | PRN
  Administered 2015-02-03: 10 mL
  Filled 2015-02-03: qty 10

## 2015-02-03 MED ORDER — HYDROCODONE-ACETAMINOPHEN 5-325 MG PO TABS: 2.0000 | ORAL_TABLET | Freq: Four times a day (QID) | ORAL | Status: DC | PRN

## 2015-02-03 MED ORDER — PROCHLORPERAZINE MALEATE 10 MG PO TABS: 10.0000 mg | ORAL_TABLET | Freq: Four times a day (QID) | ORAL | Status: DC | PRN

## 2015-02-03 MED ORDER — BENZONATATE 100 MG PO CAPS: 100.0000 mg | ORAL_CAPSULE | Freq: Three times a day (TID) | ORAL | Status: DC

## 2015-02-03 NOTE — Patient Instructions (Signed)
[facility] Discharge Instructions for Patients Receiving Chemotherapy  Today you received the following chemotherapy agents Nivolumab  To help prevent nausea and vomiting after your treatment, we encourage you to take your nausea medication     If you develop nausea and vomiting that is not controlled by your nausea medication, call the clinic.   BELOW ARE SYMPTOMS THAT SHOULD BE REPORTED IMMEDIATELY:  *FEVER GREATER THAN 100.5 F  *CHILLS WITH OR WITHOUT FEVER  NAUSEA AND VOMITING THAT IS NOT CONTROLLED WITH YOUR NAUSEA MEDICATION  *UNUSUAL SHORTNESS OF BREATH  *UNUSUAL BRUISING OR BLEEDING  TENDERNESS IN MOUTH AND THROAT WITH OR WITHOUT PRESENCE OF ULCERS  *URINARY PROBLEMS  *BOWEL PROBLEMS  UNUSUAL RASH Items with * indicate a potential emergency and should be followed up as soon as possible.  Feel free to call the clinic you have any questions or concerns. The clinic phone number is [phone].  Please show the CHEMO ALERT CARD at check-in to the Emergency Department and triage nurse.   

## 2015-02-04 ENCOUNTER — Telehealth: Payer: Self-pay | Admitting: *Deleted

## 2015-02-04 NOTE — Telephone Encounter (Signed)
-----   Message from Keith Holloway sent at 02/03/2015 10:25 AM EST ----- Regarding: chemo f/u call Mohamed 1st nivolumab- change in tx

## 2015-02-04 NOTE — Telephone Encounter (Signed)
Pt had diarhea 5-6 times, yesterday, able to eat sandwich today. Encouraged fluids, if Diarrhea continues take imodium. Pt unable to sleep due to cough, per MD pt to take Benadryl instead of Tylenol PM. Wife verbalized understanding no further concerns

## 2015-02-05 ENCOUNTER — Other Ambulatory Visit: Payer: Medicare Other

## 2015-02-05 ENCOUNTER — Ambulatory Visit (HOSPITAL_COMMUNITY): Payer: Medicare Other

## 2015-02-08 ENCOUNTER — Other Ambulatory Visit: Payer: Self-pay | Admitting: Internal Medicine

## 2015-02-12 ENCOUNTER — Ambulatory Visit: Payer: Medicare Other | Admitting: Internal Medicine

## 2015-02-18 ENCOUNTER — Telehealth: Payer: Self-pay | Admitting: Internal Medicine

## 2015-02-18 ENCOUNTER — Ambulatory Visit (HOSPITAL_BASED_OUTPATIENT_CLINIC_OR_DEPARTMENT_OTHER): Payer: Medicare Other

## 2015-02-18 ENCOUNTER — Ambulatory Visit (HOSPITAL_BASED_OUTPATIENT_CLINIC_OR_DEPARTMENT_OTHER): Payer: Medicare Other | Admitting: Physician Assistant

## 2015-02-18 ENCOUNTER — Other Ambulatory Visit (HOSPITAL_BASED_OUTPATIENT_CLINIC_OR_DEPARTMENT_OTHER): Payer: Medicare Other

## 2015-02-18 ENCOUNTER — Other Ambulatory Visit: Payer: Self-pay | Admitting: Physician Assistant

## 2015-02-18 VITALS — BP 126/74 | HR 103 | Temp 98.6°F | Resp 18 | Ht 71.0 in | Wt 184.8 lb

## 2015-02-18 DIAGNOSIS — C3490 Malignant neoplasm of unspecified part of unspecified bronchus or lung: Secondary | ICD-10-CM

## 2015-02-18 DIAGNOSIS — Z5112 Encounter for antineoplastic immunotherapy: Secondary | ICD-10-CM | POA: Diagnosis present

## 2015-02-18 DIAGNOSIS — D509 Iron deficiency anemia, unspecified: Secondary | ICD-10-CM

## 2015-02-18 DIAGNOSIS — Z23 Encounter for immunization: Secondary | ICD-10-CM | POA: Diagnosis not present

## 2015-02-18 DIAGNOSIS — C3492 Malignant neoplasm of unspecified part of left bronchus or lung: Secondary | ICD-10-CM

## 2015-02-18 DIAGNOSIS — C7951 Secondary malignant neoplasm of bone: Secondary | ICD-10-CM | POA: Diagnosis not present

## 2015-02-18 DIAGNOSIS — R5383 Other fatigue: Secondary | ICD-10-CM

## 2015-02-18 DIAGNOSIS — C3491 Malignant neoplasm of unspecified part of right bronchus or lung: Secondary | ICD-10-CM

## 2015-02-18 LAB — COMPREHENSIVE METABOLIC PANEL
ALK PHOS: 154 U/L — AB (ref 40–150)
ALT: 18 U/L (ref 0–55)
ANION GAP: 8 meq/L (ref 3–11)
AST: 19 U/L (ref 5–34)
Albumin: 3.6 g/dL (ref 3.5–5.0)
BUN: 37.2 mg/dL — AB (ref 7.0–26.0)
CALCIUM: 9.6 mg/dL (ref 8.4–10.4)
CO2: 25 mEq/L (ref 22–29)
CREATININE: 2.2 mg/dL — AB (ref 0.7–1.3)
Chloride: 103 mEq/L (ref 98–109)
EGFR: 39 mL/min/{1.73_m2} — ABNORMAL LOW (ref >90)
GLUCOSE: 198 mg/dL — AB (ref 70–140)
POTASSIUM: 5.3 meq/L — AB (ref 3.5–5.1)
Sodium: 136 mEq/L (ref 136–145)
Total Protein: 8.8 g/dL — ABNORMAL HIGH (ref 6.4–8.3)

## 2015-02-18 LAB — TSH: TSH: 0.59 m(IU)/L (ref 0.320–4.118)

## 2015-02-18 LAB — CBC WITH DIFFERENTIAL/PLATELET
BASO%: 1.1 % (ref 0.0–2.0)
BASOS ABS: 0.2 10*3/uL — AB (ref 0.0–0.1)
EOS%: 2.7 % (ref 0.0–7.0)
Eosinophils Absolute: 0.5 10*3/uL (ref 0.0–0.5)
HEMATOCRIT: 34 % — AB (ref 38.4–49.9)
HGB: 10.7 g/dL — ABNORMAL LOW (ref 13.0–17.1)
LYMPH#: 1.4 10*3/uL (ref 0.9–3.3)
LYMPH%: 7 % — ABNORMAL LOW (ref 14.0–49.0)
MCH: 21.9 pg — AB (ref 27.2–33.4)
MCHC: 31.6 g/dL — AB (ref 32.0–36.0)
MCV: 69.2 fL — ABNORMAL LOW (ref 79.3–98.0)
MONO#: 1.9 10*3/uL — ABNORMAL HIGH (ref 0.1–0.9)
MONO%: 9.7 % (ref 0.0–14.0)
NEUT#: 15.9 10*3/uL — ABNORMAL HIGH (ref 1.5–6.5)
NEUT%: 79.5 % — AB (ref 39.0–75.0)
Platelets: 317 10*3/uL (ref 140–400)
RBC: 4.91 10*6/uL (ref 4.20–5.82)
RDW: 14.6 % (ref 11.0–14.6)
WBC: 20.1 10*3/uL — ABNORMAL HIGH (ref 4.0–10.3)

## 2015-02-18 MED ORDER — BENZONATATE 100 MG PO CAPS: 100.0000 mg | ORAL_CAPSULE | Freq: Three times a day (TID) | ORAL | Status: DC

## 2015-02-18 MED ORDER — PROCHLORPERAZINE MALEATE 10 MG PO TABS: 10.0000 mg | ORAL_TABLET | Freq: Four times a day (QID) | ORAL | Status: DC | PRN

## 2015-02-18 MED ORDER — SODIUM CHLORIDE 0.9 % IV SOLN
Freq: Once | INTRAVENOUS | Status: AC
  Administered 2015-02-18: 15:00:00 via INTRAVENOUS

## 2015-02-18 MED ORDER — INFLUENZA VAC SPLIT QUAD 0.5 ML IM SUSY
0.5000 mL | PREFILLED_SYRINGE | Freq: Once | INTRAMUSCULAR | Status: AC
  Administered 2015-02-18: 0.5 mL via INTRAMUSCULAR
  Filled 2015-02-18: qty 0.5

## 2015-02-18 MED ORDER — HYDROCODONE-ACETAMINOPHEN 5-325 MG PO TABS: 2.0000 | ORAL_TABLET | Freq: Four times a day (QID) | ORAL | Status: DC | PRN

## 2015-02-18 MED ORDER — HEPARIN SOD (PORK) LOCK FLUSH 100 UNIT/ML IV SOLN
500.0000 [IU] | Freq: Once | INTRAVENOUS | Status: AC | PRN
  Administered 2015-02-18: 500 [IU]
  Filled 2015-02-18: qty 5

## 2015-02-18 MED ORDER — SODIUM CHLORIDE 0.9 % IV SOLN
240.0000 mg | Freq: Once | INTRAVENOUS | Status: AC
  Administered 2015-02-18: 240 mg via INTRAVENOUS
  Filled 2015-02-18: qty 24

## 2015-02-18 MED ORDER — SODIUM CHLORIDE 0.9 % IJ SOLN
10.0000 mL | INTRAMUSCULAR | Status: DC | PRN
  Administered 2015-02-18: 10 mL
  Filled 2015-02-18: qty 10

## 2015-02-18 NOTE — Progress Notes (Signed)
Ok to treat with Creatinine 2.2 per MD Julien Nordmann

## 2015-02-18 NOTE — Progress Notes (Signed)
[place] Telephone:([phone]   [phone]  OFFICE PROGRESS NOTE  [name], MD [address] [address]  DIAGNOSIS: Stage IV (T2a, N3, M1b) non-small cell lung cancer, squamous cell carcinoma diagnosed in April 2016.  PRIOR THERAPY: Systemic chemotherapy with carboplatin for AUC of 5 given on day 1 and gemcitabine 1000 MG/M2 on days 1 and 8 every 3 weeks. Status post 6 cycles with partial response.  CURRENT THERAPY: Nivolumab-OPDIVO- immunotherapy due to disease progression. D1C1 on 02/03/15 (Por placed on 01/30/15)  CODE STATUS: Full code initially but no prolonged resuscitation    INTERVAL HISTORY: Keith Holloway 55 y.o. male returns to the clinic today for follow-up visit accompanied by his wife.He began his treatment with immunotherapy with Opdivo on 12/19 He denied having any significant chest pain but continues to have shortness breath with exertion with cough and no hemoptysis. He denied having any significant weight loss or night sweats. The patient denied having any fever or chills. He has no nausea or vomiting. No bleeding issues. Ambulating without difficulty.  MEDICAL HISTORY: Past Medical History  Diagnosis Date  . Diabetes mellitus without complication ([place])   . Hypertension   . High cholesterol   . Migraine   . Depression   . Arthritis   . Alcohol abuse   . Polysubstance abuse     alcohol and cocaine  . Polyneuropathy in diabetes(357.2)   . Memory disorder 08/22/2013  . CHF (congestive heart failure) ([place])   . Tobacco abuse counseling 10/09/2014  . Legally blind   . Cancer ([place])     Lung Cancer    ALLERGIES:  has No Known Allergies.  MEDICATIONS:  Current Outpatient Prescriptions  Medication Sig Dispense Refill  . aspirin EC 81 MG tablet Take 1 tablet by mouth daily.    . benzonatate (TESSALON) 100 MG capsule Take 1 capsule (100 mg total) by mouth every 8 (eight) hours. 21 capsule 0  .  HYDROcodone-acetaminophen (NORCO/VICODIN) 5-325 MG tablet Take 2 tablets by mouth every 6 (six) hours as needed. 60 tablet 0  . insulin NPH-regular Human (NOVOLIN 70/30) (70-30) 100 UNIT/ML injection Inject 35 Units into the skin 2 (two) times daily with a meal. 10 mL 11  . lidocaine-prilocaine (EMLA) cream Apply 1 application topically as needed. 30 g 0  . losartan-hydrochlorothiazide (HYZAAR) 100-25 MG per tablet Take 1 tablet by mouth daily.  2  . metFORMIN (GLUCOPHAGE) 1000 MG tablet Take 1 tablet (1,000 mg total) by mouth 2 (two) times daily. 60 tablet 12  . nicotine (NICODERM CQ - DOSED IN MG/24 HOURS) 14 mg/24hr patch PLACE 1 PATCH (14 MG TOTAL) ONTO THE SKIN DAILY. 28 patch 0  . prochlorperazine (COMPAZINE) 10 MG tablet Take 1 tablet (10 mg total) by mouth every 6 (six) hours as needed for nausea or vomiting. 30 tablet 0  . simvastatin (ZOCOR) 40 MG tablet Take 1 tablet (40 mg total) by mouth daily. 30 tablet 3   No current facility-administered medications for this visit.    SURGICAL HISTORY:  Past Surgical History  Procedure Laterality Date  . Eye surgery  04/2012    detatched retna, bilateral  . Eye surgery  08/2012  . Cataract extraction      bilaterally  . Retinol detachment Bilateral 2014    REVIEW OF SYSTEMS:  Constitutional: positive for fatigue Eyes: legally blind Ears, nose, mouth, throat, and face: negative Respiratory: positive for dyspnea on exertion Cardiovascular: negative Gastrointestinal: negative  except for mild nausea controlled with anti-emetics, occasional diarrhea Genitourinary:negative Integument/breast: negative Hematologic/lymphatic: negative Musculoskeletal:negative Neurological: negative Behavioral/Psych: negative Endocrine: negative Allergic/Immunologic: negative   PHYSICAL EXAMINATION: General appearance: alert, cooperative, fatigued and no distress Head: Normocephalic, without obvious abnormality, atraumatic Neck: no adenopathy, no JVD,  supple, symmetrical, trachea midline and thyroid not enlarged, symmetric, no tenderness/mass/nodules Lymph nodes: Cervical, supraclavicular, and axillary nodes normal. Resp: clear to auscultation bilaterally Back: symmetric, no curvature. ROM normal. No CVA tenderness. Cardio: regular rate and rhythm, S1, S2 normal, no murmur, click, rub or gallop GI: soft, non-tender; bowel sounds normal; no masses,  no organomegaly Extremities: extremities normal, atraumatic, no cyanosis or edema Neurologic: Alert and oriented X 3, normal strength and tone. Normal symmetric reflexes. Normal coordination and gait  ECOG PERFORMANCE STATUS: 1 - Symptomatic but completely ambulatory  Blood pressure 126/74, pulse 103, temperature 98.6 F (37 C), temperature source Oral, resp. rate 18, height '5\' 11"'$  (1.803 m), weight 184 lb 12.8 oz (83.825 kg), SpO2 100 %.  LABORATORY DATA: CBC Latest Ref Rng 02/18/2015 02/03/2015 01/30/2015  WBC 4.0 - 10.3 10e3/uL 20.1(H) 12.0(H) 9.1  Hemoglobin 13.0 - 17.1 g/dL 10.7(L) 11.1(L) 10.8(L)  Hematocrit 38.4 - 49.9 % 34.0(L) 33.3(L) 32.7(L)  Platelets 140 - 400 10e3/uL 317 296 308   CMP Latest Ref Rng 02/18/2015 02/03/2015 01/30/2015  Glucose 70 - 140 mg/dl 198(H) 136 109(H)  BUN 7.0 - 26.0 mg/dL 37.2(H) 21.4 15  Creatinine 0.7 - 1.3 mg/dL 2.2(H) 1.6(H) 1.43(H)  Sodium 136 - 145 mEq/L 136 137 142  Potassium 3.5 - 5.1 mEq/L 5.3(H) 4.1 4.4  Chloride 101 - 111 mmol/L - - 106  CO2 22 - 29 mEq/L '25 25 27  '$ Calcium 8.4 - 10.4 mg/dL 9.6 9.8 9.7  Total Protein 6.4 - 8.3 g/dL 8.8(H) 8.6(H) -  Total Bilirubin 0.20 - 1.20 mg/dL <0.30 <0.30 -  Alkaline Phos 40 - 150 U/L 154(H) 139 -  AST 5 - 34 U/L 19 21 -  ALT 0 - 55 U/L 18 18 -      RADIOGRAPHIC STUDIES: Dg Chest 2 View  01/20/2015  CLINICAL DATA:  Shortness of breath and cough, history of lung carcinoma EXAM: CHEST - 2 VIEW COMPARISON:  10/30/14 FINDINGS: Cardiac shadow is stable. The lungs are well aerated bilaterally. Mild chronic  volume loss in the left lung base is noted. Nodular density is noted in the right lung apex slightly more prominent than that seen on the recent CT examination. No other focal abnormality is seen. Bilateral nipple shadows are noted. IMPRESSION: Mild chronic volume loss in the left base stable from the prior exam. Nodular density in the right apex slightly more prominent than that seen on the prior CT. Electronically Signed   By: Inez Catalina M.D.   On: 01/20/2015 18:22   Ct Angio Chest Pe W/cm &/or Wo Cm  01/20/2015  CLINICAL DATA:  shortness of breath. [place] wanted patient to be evaluated for PE. Dx with stage 4 lung cancer. Last chemo in September eval for pe;reduced dose;GFR was 73 Lung ca dx 3/16;no xrt ;chemo completed EXAM: CT ANGIOGRAPHY CHEST WITH CONTRAST TECHNIQUE: Multidetector CT imaging of the chest was performed using the standard protocol during bolus administration of intravenous contrast. Multiplanar CT image reconstructions and MIPs were obtained to evaluate the vascular anatomy. CONTRAST:  3m OMNIPAQUE IOHEXOL 350 MG/ML SOLN COMPARISON:  10/30/2014 FINDINGS: Left arm IV contrast injection. Innominate vein and SVC patent. The RV is nondilated. Satisfactory opacification of pulmonary arteries noted,  and there is no evidence of pulmonary emboli. 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 Adequate contrast opacification of the thoracic aorta with no evidence of dissection, aneurysm, or stenosis. There is classic 3-vessel brachiocephalic arch anatomy without proximal stenosis. Trace left pleural effusion. No pericardial effusion. Progression of confluent anterior mediastinal adenopathy, measuring approximately 7.8 x 3.5 cm maximum transverse dimensions (previously 6.4 x 3 cm). There is new left hilar adenopathy. 4.7 cm left infrahilar mass, previously 19 mm. Increase in number and size of pulmonary nodules, involving all lobes, largest 3 cm  diameter in the posterior basal segment left lower lobe image 71/8. Interstitial thickening and ground-glass opacities in the left lower lobe, new since previous exam. Progressive sclerotic lesions in  sternum and manubrium. Visualized portions of upper abdomen unremarkable, although the adrenal glands are incompletely visualized. Review of the MIP images confirms the above findings. IMPRESSION: 1. Negative for acute PE or thoracic aortic dissection. 2. Progression of left infrahilar mass, left hilar and mediastinal adenopathy, and bilateral pulmonary metastatic disease as well as sternal metastases. 3. Interval encasement and occlusion of the inferior left pulmonary vein which may account for the interstitial changes in the basilar segments of the left lower lobe although lymphangitic spread of tumor can have a similar appearance. Electronically Signed   By: Lucrezia Europe M.D.   On: 01/20/2015 20:15   Ir Fluoro Guide Cv Line Right  01/30/2015  CLINICAL DATA:  Progressive metastatic squamous cell carcinoma of the lung with need for porta cath for additional immunotherapy treatment. EXAM: IMPLANTED PORT A CATH PLACEMENT WITH ULTRASOUND AND FLUOROSCOPIC GUIDANCE ANESTHESIA/SEDATION: 3.0 Mg IV Versed; 50 mcg IV Fentanyl Total Moderate Sedation Time:  32 minutes Additional Medications: 2 g IV Ancef. As antibiotic prophylaxis, Ancef was ordered pre-procedure and administered intravenously within one hour of incision. FLUOROSCOPY TIME:  24 seconds. PROCEDURE: The procedure, risks, benefits, and alternatives were explained to the patient. Questions regarding the procedure were encouraged and answered. The patient understands and consents to the procedure. A time-out was performed prior to the procedure. The right neck and chest were prepped with chlorhexidine in a sterile fashion, and a sterile drape was applied covering the operative field. Maximum barrier sterile technique with sterile gowns and gloves were used for the  procedure. Local anesthesia was provided with 1% lidocaine. Ultrasound was used to confirm patency of the right internal jugular vein. After creating a small venotomy incision, a 21 gauge needle was advanced into the right internal jugular vein under direct, real-time ultrasound guidance. Ultrasound image documentation was performed. After securing guidewire access, an 8 Fr dilator was placed. A J-wire was kinked to measure appropriate catheter length. A subcutaneous port pocket was then created along the upper chest wall utilizing sharp and blunt dissection. Portable cautery was utilized. The pocket was irrigated with sterile saline. A single lumen power injectable port was chosen for placement. The 8 Fr catheter was tunneled from the port pocket site to the venotomy incision. The port was placed in the pocket. External catheter was trimmed to appropriate length based on guidewire measurement. At the venotomy, an 8 Fr peel-away sheath was placed over a guidewire. The catheter was then placed through the sheath and the sheath removed. Final catheter positioning was confirmed and documented with a fluoroscopic spot image. The port was accessed with a needle and aspirated and flushed with heparinized saline. The needle was removed. The venotomy and port pocket incisions were closed with subcutaneous 3-0 Monocryl  and subcuticular 4-0 Vicryl. Dermabond was applied to both incisions. COMPLICATIONS: None FINDINGS: After catheter placement, the tip lies at the cavoatrial junction. The catheter aspirates normally and is ready for immediate use. IMPRESSION: Placement of single lumen port a cath via right internal jugular vein. The catheter tip lies at the cavoatrial junction. A power injectable port a cath was placed and is ready for immediate use. Electronically Signed   By: Aletta Edouard M.D.   On: 01/30/2015 17:59   Ir US Guide Vasc Access Right  01/30/2015  CLINICAL DATA:  Progressive metastatic squamous cell  carcinoma of the lung with need for porta cath for additional immunotherapy treatment. EXAM: IMPLANTED PORT A CATH PLACEMENT WITH ULTRASOUND AND FLUOROSCOPIC GUIDANCE ANESTHESIA/SEDATION: 3.0 Mg IV Versed; 50 mcg IV Fentanyl Total Moderate Sedation Time:  32 minutes Additional Medications: 2 g IV Ancef. As antibiotic prophylaxis, Ancef was ordered pre-procedure and administered intravenously within one hour of incision. FLUOROSCOPY TIME:  24 seconds. PROCEDURE: The procedure, risks, benefits, and alternatives were explained to the patient. Questions regarding the procedure were encouraged and answered. The patient understands and consents to the procedure. A time-out was performed prior to the procedure. The right neck and chest were prepped with chlorhexidine in a sterile fashion, and a sterile drape was applied covering the operative field. Maximum barrier sterile technique with sterile gowns and gloves were used for the procedure. Local anesthesia was provided with 1% lidocaine. Ultrasound was used to confirm patency of the right internal jugular vein. After creating a small venotomy incision, a 21 gauge needle was advanced into the right internal jugular vein under direct, real-time ultrasound guidance. Ultrasound image documentation was performed. After securing guidewire access, an 8 Fr dilator was placed. A J-wire was kinked to measure appropriate catheter length. A subcutaneous port pocket was then created along the upper chest wall utilizing sharp and blunt dissection. Portable cautery was utilized. The pocket was irrigated with sterile saline. A single lumen power injectable port was chosen for placement. The 8 Fr catheter was tunneled from the port pocket site to the venotomy incision. The port was placed in the pocket. External catheter was trimmed to appropriate length based on guidewire measurement. At the venotomy, an 8 Fr peel-away sheath was placed over a guidewire. The catheter was then placed  through the sheath and the sheath removed. Final catheter positioning was confirmed and documented with a fluoroscopic spot image. The port was accessed with a needle and aspirated and flushed with heparinized saline. The needle was removed. The venotomy and port pocket incisions were closed with subcutaneous 3-0 Monocryl and subcuticular 4-0 Vicryl. Dermabond was applied to both incisions. COMPLICATIONS: None FINDINGS: After catheter placement, the tip lies at the cavoatrial junction. The catheter aspirates normally and is ready for immediate use. IMPRESSION: Placement of single lumen port a cath via right internal jugular vein. The catheter tip lies at the cavoatrial junction. A power injectable port a cath was placed and is ready for immediate use. Electronically Signed   By: Aletta Edouard M.D.   On: 01/30/2015 17:59    ASSESSMENT AND PLAN: This is a very pleasant 55 years old African-American male with a stage IV non-small cell lung cancer, squamous cell carcinoma who completed systemic chemotherapy with carboplatin and gemcitabine status post 6 cycles. The patient has tolerated his treatment fairly well with no significant adverse effects except for mild fatigue and nausea. The recent CT scan of the chest, abdomen and pelvis showed disease progression of the  left infrahilar mass, left hilar and mediastinal adenopathy, pulmonary bilateral mets and sternal metastases, possible lymphangitic spread of tumor.  After port a cath placement on 12/15, he started immunotherapy in the form Nivolumab (Opdivo) on 02/03/15 every 2 weeks to control his disease. Side effects, including diarrhea, rash or inflammation have been discussed. He tolerated Cycle 1 without significant side effects. Will proceed with Cycle 2 He will return prior to cycle 3 in 2 weeks He is also to receive his flu shot today For his low MCV, he has been instructed to take Iron supplements and increase his Iron rich food intake with Vit C to  increase absorption For his elevated white count, likely due to pain and cough, will continue to monitor for now The patient knows to call if he has any questions or concerns in the interim. A refill for tessalon pearless, promethazine and Vicodin were written today. He was also instructed to take Delsym as needed For smoke cessation, he will continue Nicoderm 14 mg/24 hour.  CODE STATUS was discussed with the patient and he would like to continue with full code but no prolonged resuscitation. He was advised to call immediately if he has any concerning symptoms in the interval.  The patient voices understanding of current disease status and treatment options and is in agreement with the current care plan.  Rondel Jumbo, PA-C 02/18/2015   ADDENDUM: Hematology/Oncology Attending: I had a face to face encounter with the patient today. I recommended his care plan. This is a very pleasant 55 years old African-American male with metastatic non-small cell lung cancer, squamous cell carcinoma and he is currently undergoing second line treatment with immunotherapy with Nivolumab status post 1 cycle. He tolerated the first cycle of his treatment well with no significant adverse effects. I recommended for the patient to proceed with cycle #2 today as a scheduled. He will receive flu shot today. For the iron deficiency anemia, I recommended for the patient to start taking oral iron tablets on daily basis. The patient would come back for follow-up visit in 2 weeks for reevaluation before starting cycle #3. He was advised to call immediately if he has any concerning symptoms in the interval.  Disclaimer: This note was dictated with voice recognition software. Similar sounding words can inadvertently be transcribed and may be missed upon review. Eilleen Kempf., MD 02/18/2015

## 2015-02-18 NOTE — Telephone Encounter (Signed)
Talked to patient here in office. Scheduled appts.       [facility].

## 2015-03-03 ENCOUNTER — Other Ambulatory Visit: Payer: Medicare Other

## 2015-03-03 ENCOUNTER — Ambulatory Visit: Payer: Medicare Other

## 2015-03-04 ENCOUNTER — Ambulatory Visit (HOSPITAL_BASED_OUTPATIENT_CLINIC_OR_DEPARTMENT_OTHER): Payer: Medicare Other | Admitting: Internal Medicine

## 2015-03-04 ENCOUNTER — Other Ambulatory Visit: Payer: Self-pay | Admitting: Medical Oncology

## 2015-03-04 ENCOUNTER — Ambulatory Visit (HOSPITAL_BASED_OUTPATIENT_CLINIC_OR_DEPARTMENT_OTHER): Payer: Medicare Other

## 2015-03-04 ENCOUNTER — Other Ambulatory Visit (HOSPITAL_BASED_OUTPATIENT_CLINIC_OR_DEPARTMENT_OTHER): Payer: Medicare Other

## 2015-03-04 ENCOUNTER — Encounter: Payer: Self-pay | Admitting: Internal Medicine

## 2015-03-04 VITALS — BP 130/76 | HR 120 | Temp 98.2°F | Resp 18 | Ht 71.0 in | Wt 179.5 lb

## 2015-03-04 DIAGNOSIS — C3490 Malignant neoplasm of unspecified part of unspecified bronchus or lung: Secondary | ICD-10-CM | POA: Diagnosis present

## 2015-03-04 DIAGNOSIS — R110 Nausea: Secondary | ICD-10-CM

## 2015-03-04 DIAGNOSIS — Z5111 Encounter for antineoplastic chemotherapy: Secondary | ICD-10-CM

## 2015-03-04 DIAGNOSIS — C3492 Malignant neoplasm of unspecified part of left bronchus or lung: Secondary | ICD-10-CM

## 2015-03-04 DIAGNOSIS — C3491 Malignant neoplasm of unspecified part of right bronchus or lung: Secondary | ICD-10-CM

## 2015-03-04 DIAGNOSIS — Z5112 Encounter for antineoplastic immunotherapy: Secondary | ICD-10-CM

## 2015-03-04 DIAGNOSIS — R530 Neoplastic (malignant) related fatigue: Secondary | ICD-10-CM | POA: Diagnosis not present

## 2015-03-04 LAB — CBC WITH DIFFERENTIAL/PLATELET
BASO%: 0.8 % (ref 0.0–2.0)
BASOS ABS: 0.1 10*3/uL (ref 0.0–0.1)
EOS ABS: 0.6 10*3/uL — AB (ref 0.0–0.5)
EOS%: 3.5 % (ref 0.0–7.0)
HCT: 32.2 % — ABNORMAL LOW (ref 38.4–49.9)
HEMOGLOBIN: 10.4 g/dL — AB (ref 13.0–17.1)
LYMPH%: 7.6 % — AB (ref 14.0–49.0)
MCH: 21.8 pg — AB (ref 27.2–33.4)
MCHC: 32.2 g/dL (ref 32.0–36.0)
MCV: 67.5 fL — AB (ref 79.3–98.0)
MONO#: 1.8 10*3/uL — ABNORMAL HIGH (ref 0.1–0.9)
MONO%: 11.2 % (ref 0.0–14.0)
NEUT%: 76.9 % — ABNORMAL HIGH (ref 39.0–75.0)
NEUTROS ABS: 12.6 10*3/uL — AB (ref 1.5–6.5)
Platelets: 354 10*3/uL (ref 140–400)
RBC: 4.77 10*6/uL (ref 4.20–5.82)
RDW: 14.6 % (ref 11.0–14.6)
WBC: 16.4 10*3/uL — ABNORMAL HIGH (ref 4.0–10.3)
lymph#: 1.3 10*3/uL (ref 0.9–3.3)

## 2015-03-04 LAB — COMPREHENSIVE METABOLIC PANEL
ALBUMIN: 3.6 g/dL (ref 3.5–5.0)
ALK PHOS: 174 U/L — AB (ref 40–150)
ALT: 17 U/L (ref 0–55)
ANION GAP: 10 meq/L (ref 3–11)
AST: 20 U/L (ref 5–34)
BUN: 32.4 mg/dL — ABNORMAL HIGH (ref 7.0–26.0)
CALCIUM: 10.1 mg/dL (ref 8.4–10.4)
CO2: 23 mEq/L (ref 22–29)
Chloride: 101 mEq/L (ref 98–109)
Creatinine: 2.5 mg/dL — ABNORMAL HIGH (ref 0.7–1.3)
EGFR: 33 mL/min/{1.73_m2} — AB (ref >90)
GLUCOSE: 156 mg/dL — AB (ref 70–140)
POTASSIUM: 5 meq/L (ref 3.5–5.1)
SODIUM: 135 meq/L — AB (ref 136–145)
TOTAL PROTEIN: 9.2 g/dL — AB (ref 6.4–8.3)
Total Bilirubin: 0.36 mg/dL (ref 0.20–1.20)

## 2015-03-04 MED ORDER — PROCHLORPERAZINE MALEATE 10 MG PO TABS: 10.0000 mg | ORAL_TABLET | Freq: Four times a day (QID) | ORAL | Status: DC | PRN

## 2015-03-04 MED ORDER — HEPARIN SOD (PORK) LOCK FLUSH 100 UNIT/ML IV SOLN
500.0000 [IU] | Freq: Once | INTRAVENOUS | Status: AC | PRN
  Administered 2015-03-04: 500 [IU]
  Filled 2015-03-04: qty 5

## 2015-03-04 MED ORDER — SODIUM CHLORIDE 0.9 % IJ SOLN
10.0000 mL | INTRAMUSCULAR | Status: DC | PRN
  Administered 2015-03-04: 10 mL
  Filled 2015-03-04: qty 10

## 2015-03-04 MED ORDER — SODIUM CHLORIDE 0.9 % IV SOLN
Freq: Once | INTRAVENOUS | Status: AC
  Administered 2015-03-04: 13:00:00 via INTRAVENOUS

## 2015-03-04 MED ORDER — SODIUM CHLORIDE 0.9 % IV SOLN
240.0000 mg | Freq: Once | INTRAVENOUS | Status: AC
  Administered 2015-03-04: 240 mg via INTRAVENOUS
  Filled 2015-03-04: qty 24

## 2015-03-04 MED ORDER — HYDROCODONE-ACETAMINOPHEN 5-325 MG PO TABS: 2.0000 | ORAL_TABLET | Freq: Four times a day (QID) | ORAL | Status: DC | PRN

## 2015-03-04 MED ORDER — BENZONATATE 100 MG PO CAPS: 100.0000 mg | ORAL_CAPSULE | Freq: Three times a day (TID) | ORAL | Status: DC

## 2015-03-04 NOTE — Progress Notes (Signed)
[place] Telephone:([phone]) [phone]   [phone]  OFFICE PROGRESS NOTE  [name], MD [address] [address]  DIAGNOSIS: Stage IV (T2a, N3, M1b) non-small cell lung cancer, squamous cell carcinoma diagnosed in April 2016.  PRIOR THERAPY: Systemic chemotherapy with carboplatin for AUC of 5 given on day 1 and gemcitabine 1000 MG/M2 on days 1 and 8 every 3 weeks. Status post 6 cycles with partial response.  CURRENT THERAPY: Nivolumab 240 MG IV every 2 weeks status post 2 cycles.  CODE STATUS: Full code initially but no prolonged resuscitation    INTERVAL HISTORY: Keith Holloway 55 y.o. male returns to the clinic today for follow-up visit accompanied by his wife. The patient is currently on treatment with immunotherapy with Nivolumab status post 2 cycles. He is tolerating his treatment well with no specific complaints. He continues to have persistent fatigue and aching pain in the shoulder areas. He sleeps a lot recently. He denied having any significant chest pain but continues to have shortness breath with exertion with mild cough and no hemoptysis. He denied having any significant weight loss or night sweats. The patient denied having any fever or chills. He has no nausea or vomiting. He is here today to start cycle #3 of his immunotherapy.  MEDICAL HISTORY: Past Medical History  Diagnosis Date  . Diabetes mellitus without complication ([place])   . Hypertension   . High cholesterol   . Migraine   . Depression   . Arthritis   . Alcohol abuse   . Polysubstance abuse     alcohol and cocaine  . Polyneuropathy in diabetes(357.2)   . Memory disorder 08/22/2013  . CHF (congestive heart failure) ([place])   . Tobacco abuse counseling 10/09/2014  . Legally blind   . Cancer ([place])     Lung Cancer    ALLERGIES:  has No Known Allergies.  MEDICATIONS:  Current Outpatient Prescriptions  Medication Sig Dispense Refill  . aspirin EC 81 MG tablet Take  1 tablet by mouth daily.    . benzonatate (TESSALON) 100 MG capsule Take 1 capsule (100 mg total) by mouth every 8 (eight) hours. 21 capsule 0  . Ferrous Sulfate (IRON) 325 (65 Fe) MG TABS Take 1 tablet by mouth daily.    . insulin NPH-regular Human (NOVOLIN 70/30) (70-30) 100 UNIT/ML injection Inject 35 Units into the skin 2 (two) times daily with a meal. 10 mL 11  . lidocaine-prilocaine (EMLA) cream Apply 1 application topically as needed. 30 g 0  . losartan-hydrochlorothiazide (HYZAAR) 100-25 MG per tablet Take 1 tablet by mouth daily.  2  . metFORMIN (GLUCOPHAGE) 1000 MG tablet Take 1 tablet (1,000 mg total) by mouth 2 (two) times daily. 60 tablet 12  . nicotine (NICODERM CQ - DOSED IN MG/24 HOURS) 14 mg/24hr patch PLACE 1 PATCH (14 MG TOTAL) ONTO THE SKIN DAILY. 28 patch 0  . prochlorperazine (COMPAZINE) 10 MG tablet Take 1 tablet (10 mg total) by mouth every 6 (six) hours as needed for nausea or vomiting. 30 tablet 0  . simvastatin (ZOCOR) 40 MG tablet Take 1 tablet (40 mg total) by mouth daily. 30 tablet 3  . vitamin C (ASCORBIC ACID) 500 MG tablet Take 500 mg by mouth daily.    Marland Kitchen HYDROcodone-acetaminophen (NORCO/VICODIN) 5-325 MG tablet Take 2 tablets by mouth every 6 (six) hours as needed. 60 tablet 0   No current facility-administered medications for this visit.    SURGICAL HISTORY:  Past Surgical History  Procedure Laterality Date  . Eye surgery  04/2012    detatched retna, bilateral  . Eye surgery  08/2012  . Cataract extraction      bilaterally  . Retinol detachment Bilateral 2014    REVIEW OF SYSTEMS:  A comprehensive review of systems was negative except for: Constitutional: positive for anorexia, fatigue and weight loss Musculoskeletal: positive for arthralgias and myalgias   PHYSICAL EXAMINATION: General appearance: alert, cooperative, fatigued and no distress Head: Normocephalic, without obvious abnormality, atraumatic Neck: no adenopathy, no JVD, supple,  symmetrical, trachea midline and thyroid not enlarged, symmetric, no tenderness/mass/nodules Lymph nodes: Cervical, supraclavicular, and axillary nodes normal. Resp: clear to auscultation bilaterally Back: symmetric, no curvature. ROM normal. No CVA tenderness. Cardio: regular rate and rhythm, S1, S2 normal, no murmur, click, rub or gallop GI: soft, non-tender; bowel sounds normal; no masses,  no organomegaly Extremities: extremities normal, atraumatic, no cyanosis or edema Neurologic: Alert and oriented X 3, normal strength and tone. Normal symmetric reflexes. Normal coordination and gait  ECOG PERFORMANCE STATUS: 1 - Symptomatic but completely ambulatory  Blood pressure 130/76, pulse 120, temperature 98.2 F (36.8 C), temperature source Oral, resp. rate 18, height '5\' 11"'$  (1.803 m), weight 179 lb 8 oz (81.421 kg), SpO2 96 %.  LABORATORY DATA: Lab Results  Component Value Date   WBC 16.4* 03/04/2015   HGB 10.4* 03/04/2015   HCT 32.2* 03/04/2015   MCV 67.5* 03/04/2015   PLT 354 03/04/2015      Chemistry      Component Value Date/Time   NA 135* 03/04/2015 1045   NA 142 01/30/2015 1330   K 5.0 03/04/2015 1045   K 4.4 01/30/2015 1330   CL 106 01/30/2015 1330   CO2 23 03/04/2015 1045   CO2 27 01/30/2015 1330   BUN 32.4* 03/04/2015 1045   BUN 15 01/30/2015 1330   CREATININE 2.5* 03/04/2015 1045   CREATININE 1.43* 01/30/2015 1330   CREATININE 1.02 01/23/2014 1313      Component Value Date/Time   CALCIUM 10.1 03/04/2015 1045   CALCIUM 9.7 01/30/2015 1330   ALKPHOS 174* 03/04/2015 1045   ALKPHOS 108 05/05/2014 2049   AST 20 03/04/2015 1045   AST 20 05/05/2014 2049   ALT 17 03/04/2015 1045   ALT 24 05/05/2014 2049   BILITOT 0.36 03/04/2015 1045   BILITOT 0.5 05/05/2014 2049       RADIOGRAPHIC STUDIES: No results found.  ASSESSMENT AND PLAN: This is a very pleasant 55 years old African-American male with a stage IV non-small cell lung cancer, squamous cell carcinoma who  completed systemic chemotherapy with carboplatin and gemcitabine status post 6 cycles. The patient has tolerated his treatment fairly well with no significant adverse effects except for mild fatigue and nausea. He had evidence for disease progression and the patient was started on treatment with immunotherapy with Nivolumab currently status post 2 cycles. I recommended for him to proceed with cycle #3 today as scheduled. He would come back for follow-up visit in 2 weeks for reevaluation before starting cycle #4. For pain management the patient was given a refill for Vicodin. She was also given a refill for Tessalon. CODE STATUS was discussed with the patient and he would like to continue with full code but no prolonged resuscitation. He was advised to call immediately if he has any concerning symptoms in the interval. The patient voices understanding of current disease status and treatment options and is in agreement with the current care plan.  All questions were answered. The patient knows to call the clinic with any problems, questions or concerns. We can certainly see the patient much sooner if necessary.  Disclaimer: This note was dictated with voice recognition software. Similar sounding words can inadvertently be transcribed and may not be corrected upon review.

## 2015-03-04 NOTE — Patient Instructions (Signed)
[facility] Discharge Instructions for Patients Receiving Chemotherapy  Today you received the following chemotherapy agents Nivolumab.  To help prevent nausea and vomiting after your treatment, we encourage you to take your nausea medication as prescribed.   If you develop nausea and vomiting that is not controlled by your nausea medication, call the clinic.   BELOW ARE SYMPTOMS THAT SHOULD BE REPORTED IMMEDIATELY:  *FEVER GREATER THAN 100.5 F  *CHILLS WITH OR WITHOUT FEVER  NAUSEA AND VOMITING THAT IS NOT CONTROLLED WITH YOUR NAUSEA MEDICATION  *UNUSUAL SHORTNESS OF BREATH  *UNUSUAL BRUISING OR BLEEDING  TENDERNESS IN MOUTH AND THROAT WITH OR WITHOUT PRESENCE OF ULCERS  *URINARY PROBLEMS  *BOWEL PROBLEMS  UNUSUAL RASH Items with * indicate a potential emergency and should be followed up as soon as possible.  Feel free to call the clinic you have any questions or concerns. The clinic phone number is [phone].  Please show the CHEMO ALERT CARD at check-in to the Emergency Department and triage nurse.   

## 2015-03-18 ENCOUNTER — Other Ambulatory Visit (HOSPITAL_BASED_OUTPATIENT_CLINIC_OR_DEPARTMENT_OTHER): Payer: Medicare Other

## 2015-03-18 ENCOUNTER — Encounter: Payer: Self-pay | Admitting: Internal Medicine

## 2015-03-18 ENCOUNTER — Ambulatory Visit: Payer: Medicare Other | Admitting: Nutrition

## 2015-03-18 ENCOUNTER — Other Ambulatory Visit: Payer: Self-pay | Admitting: Medical Oncology

## 2015-03-18 ENCOUNTER — Ambulatory Visit (HOSPITAL_BASED_OUTPATIENT_CLINIC_OR_DEPARTMENT_OTHER): Payer: Medicare Other | Admitting: Internal Medicine

## 2015-03-18 ENCOUNTER — Ambulatory Visit (HOSPITAL_BASED_OUTPATIENT_CLINIC_OR_DEPARTMENT_OTHER): Payer: Medicare Other

## 2015-03-18 VITALS — BP 154/81 | HR 120 | Temp 98.4°F | Resp 18 | Ht 71.0 in | Wt 172.3 lb

## 2015-03-18 DIAGNOSIS — C3491 Malignant neoplasm of unspecified part of right bronchus or lung: Secondary | ICD-10-CM

## 2015-03-18 DIAGNOSIS — R197 Diarrhea, unspecified: Secondary | ICD-10-CM | POA: Diagnosis not present

## 2015-03-18 DIAGNOSIS — R0602 Shortness of breath: Secondary | ICD-10-CM | POA: Diagnosis not present

## 2015-03-18 DIAGNOSIS — C3490 Malignant neoplasm of unspecified part of unspecified bronchus or lung: Secondary | ICD-10-CM

## 2015-03-18 DIAGNOSIS — C3492 Malignant neoplasm of unspecified part of left bronchus or lung: Secondary | ICD-10-CM

## 2015-03-18 DIAGNOSIS — Z5112 Encounter for antineoplastic immunotherapy: Secondary | ICD-10-CM

## 2015-03-18 LAB — COMPREHENSIVE METABOLIC PANEL
ALT: 27 U/L (ref 0–55)
AST: 21 U/L (ref 5–34)
Albumin: 3.4 g/dL — ABNORMAL LOW (ref 3.5–5.0)
Alkaline Phosphatase: 197 U/L — ABNORMAL HIGH (ref 40–150)
Anion Gap: 10 mEq/L (ref 3–11)
BUN: 32 mg/dL — AB (ref 7.0–26.0)
CALCIUM: 10.3 mg/dL (ref 8.4–10.4)
CHLORIDE: 104 meq/L (ref 98–109)
CO2: 22 mEq/L (ref 22–29)
Creatinine: 1.6 mg/dL — ABNORMAL HIGH (ref 0.7–1.3)
EGFR: 56 mL/min/{1.73_m2} — AB (ref >90)
Glucose: 211 mg/dl — ABNORMAL HIGH (ref 70–140)
POTASSIUM: 5.3 meq/L — AB (ref 3.5–5.1)
SODIUM: 136 meq/L (ref 136–145)
Total Bilirubin: <0.3 mg/dL (ref 0.20–1.20)
Total Protein: 9.3 g/dL — ABNORMAL HIGH (ref 6.4–8.3)

## 2015-03-18 LAB — CBC WITH DIFFERENTIAL/PLATELET
BASO%: 0.5 % (ref 0.0–2.0)
BASOS ABS: 0.1 10*3/uL (ref 0.0–0.1)
EOS%: 2.2 % (ref 0.0–7.0)
Eosinophils Absolute: 0.5 10*3/uL (ref 0.0–0.5)
HEMATOCRIT: 33.3 % — AB (ref 38.4–49.9)
HGB: 10.6 g/dL — ABNORMAL LOW (ref 13.0–17.1)
LYMPH%: 6.4 % — ABNORMAL LOW (ref 14.0–49.0)
MCH: 21.4 pg — AB (ref 27.2–33.4)
MCHC: 32 g/dL (ref 32.0–36.0)
MCV: 66.9 fL — ABNORMAL LOW (ref 79.3–98.0)
MONO#: 1.9 10*3/uL — ABNORMAL HIGH (ref 0.1–0.9)
MONO%: 8.3 % (ref 0.0–14.0)
NEUT#: 19.4 10*3/uL — ABNORMAL HIGH (ref 1.5–6.5)
NEUT%: 82.6 % — AB (ref 39.0–75.0)
Platelets: 369 10*3/uL (ref 140–400)
RBC: 4.98 10*6/uL (ref 4.20–5.82)
RDW: 14.9 % — ABNORMAL HIGH (ref 11.0–14.6)
WBC: 23.5 10*3/uL — ABNORMAL HIGH (ref 4.0–10.3)
lymph#: 1.5 10*3/uL (ref 0.9–3.3)

## 2015-03-18 MED ORDER — HEPARIN SOD (PORK) LOCK FLUSH 100 UNIT/ML IV SOLN
500.0000 [IU] | Freq: Once | INTRAVENOUS | Status: AC | PRN
  Administered 2015-03-18: 500 [IU]
  Filled 2015-03-18: qty 5

## 2015-03-18 MED ORDER — SODIUM CHLORIDE 0.9 % IV SOLN
Freq: Once | INTRAVENOUS | Status: AC
  Administered 2015-03-18: 12:00:00 via INTRAVENOUS

## 2015-03-18 MED ORDER — SODIUM CHLORIDE 0.9 % IV SOLN
240.0000 mg | Freq: Once | INTRAVENOUS | Status: AC
  Administered 2015-03-18: 240 mg via INTRAVENOUS
  Filled 2015-03-18: qty 20

## 2015-03-18 MED ORDER — BENZONATATE 100 MG PO CAPS: 100.0000 mg | ORAL_CAPSULE | Freq: Three times a day (TID) | ORAL | Status: DC

## 2015-03-18 MED ORDER — SODIUM CHLORIDE 0.9 % IJ SOLN
10.0000 mL | INTRAMUSCULAR | Status: DC | PRN
  Administered 2015-03-18: 10 mL
  Filled 2015-03-18: qty 10

## 2015-03-18 MED ORDER — PROCHLORPERAZINE MALEATE 10 MG PO TABS: 10.0000 mg | ORAL_TABLET | Freq: Four times a day (QID) | ORAL | Status: DC | PRN

## 2015-03-18 NOTE — Progress Notes (Signed)
[place] Telephone:(336) [phone]   Fax:(336) [phone]  OFFICE PROGRESS NOTE  [name], MD [address] [address]  DIAGNOSIS: Stage IV (T2a, N3, M1b) non-small cell lung cancer, squamous cell carcinoma diagnosed in April 2016.  PRIOR THERAPY: Systemic chemotherapy with carboplatin for AUC of 5 given on day 1 and gemcitabine 1000 MG/M2 on days 1 and 8 every 3 weeks. Status post 6 cycles with partial response.  CURRENT THERAPY: Nivolumab 240 MG IV every 2 weeks status post 3 cycles.  CODE STATUS: Full code initially but no prolonged resuscitation    INTERVAL HISTORY: Keith Holloway 55 y.o. male returns to the clinic today for follow-up visit accompanied by his wife and daughter. The patient is currently on treatment with immunotherapy with Nivolumab status post 3 cycles. He is tolerating his treatment well with no specific complaints except for bilateral shoulder pain. He continues to have persistent fatigue and occasional nausea. He also has one or 2 episodes of diarrhea a few times a week. He denied having any significant chest pain but continues to have shortness breath with exertion with mild cough and no hemoptysis. He denied having any significant weight loss or night sweats. The patient denied having any fever or chills. He has no nausea or vomiting. He is here today to start cycle #4 of his immunotherapy.  MEDICAL HISTORY: Past Medical History  Diagnosis Date  . Diabetes mellitus without complication ([place])   . Hypertension   . High cholesterol   . Migraine   . Depression   . Arthritis   . Alcohol abuse   . Polysubstance abuse     alcohol and cocaine  . Polyneuropathy in diabetes(357.2)   . Memory disorder 08/22/2013  . CHF (congestive heart failure) ([place])   . Tobacco abuse counseling 10/09/2014  . Legally blind   . Cancer ([place])     Lung Cancer    ALLERGIES:  has No Known Allergies.  MEDICATIONS:  Current Outpatient  Prescriptions  Medication Sig Dispense Refill  . aspirin EC 81 MG tablet Take 1 tablet by mouth daily.    . benzonatate (TESSALON) 100 MG capsule Take 1 capsule (100 mg total) by mouth every 8 (eight) hours. 21 capsule 0  . cholecalciferol (VITAMIN D) 1000 units tablet Take 1,000 Units by mouth daily.    . Ferrous Sulfate (IRON) 325 (65 Fe) MG TABS Take 1 tablet by mouth daily.    Marland Kitchen HYDROcodone-acetaminophen (NORCO/VICODIN) 5-325 MG tablet Take 2 tablets by mouth every 6 (six) hours as needed. 60 tablet 0  . insulin NPH-regular Human (NOVOLIN 70/30) (70-30) 100 UNIT/ML injection Inject 35 Units into the skin 2 (two) times daily with a meal. 10 mL 11  . lidocaine-prilocaine (EMLA) cream Apply 1 application topically as needed. 30 g 0  . losartan-hydrochlorothiazide (HYZAAR) 100-25 MG per tablet Take 1 tablet by mouth daily.  2  . metFORMIN (GLUCOPHAGE) 1000 MG tablet Take 1 tablet (1,000 mg total) by mouth 2 (two) times daily. 60 tablet 12  . prochlorperazine (COMPAZINE) 10 MG tablet Take 1 tablet (10 mg total) by mouth every 6 (six) hours as needed for nausea or vomiting. 30 tablet 0  . simvastatin (ZOCOR) 40 MG tablet Take 1 tablet (40 mg total) by mouth daily. 30 tablet 3  . vitamin C (ASCORBIC ACID) 500 MG tablet Take 500 mg by mouth daily.    . nicotine (NICODERM CQ - DOSED IN MG/24 HOURS) 14 mg/24hr patch  PLACE 1 PATCH (14 MG TOTAL) ONTO THE SKIN DAILY. 28 patch 0   No current facility-administered medications for this visit.    SURGICAL HISTORY:  Past Surgical History  Procedure Laterality Date  . Eye surgery  04/2012    detatched retna, bilateral  . Eye surgery  08/2012  . Cataract extraction      bilaterally  . Retinol detachment Bilateral 2014    REVIEW OF SYSTEMS:  A comprehensive review of systems was negative except for: Constitutional: positive for anorexia, fatigue and weight loss Musculoskeletal: positive for arthralgias and myalgias   PHYSICAL EXAMINATION: General  appearance: alert, cooperative, fatigued and no distress Head: Normocephalic, without obvious abnormality, atraumatic Neck: no adenopathy, no JVD, supple, symmetrical, trachea midline and thyroid not enlarged, symmetric, no tenderness/mass/nodules Lymph nodes: Cervical, supraclavicular, and axillary nodes normal. Resp: clear to auscultation bilaterally Back: symmetric, no curvature. ROM normal. No CVA tenderness. Cardio: regular rate and rhythm, S1, S2 normal, no murmur, click, rub or gallop GI: soft, non-tender; bowel sounds normal; no masses,  no organomegaly Extremities: extremities normal, atraumatic, no cyanosis or edema Neurologic: Alert and oriented X 3, normal strength and tone. Normal symmetric reflexes. Normal coordination and gait  ECOG PERFORMANCE STATUS: 1 - Symptomatic but completely ambulatory  Blood pressure 154/81, pulse 120, temperature 98.4 F (36.9 C), temperature source Oral, resp. rate 18, height '5\' 11"'$  (1.803 m), weight 172 lb 4.8 oz (78.155 kg), SpO2 100 %.  LABORATORY DATA: Lab Results  Component Value Date   WBC 23.5* 03/18/2015   HGB 10.6* 03/18/2015   HCT 33.3* 03/18/2015   MCV 66.9* 03/18/2015   PLT 369 03/18/2015      Chemistry      Component Value Date/Time   NA 135* 03/04/2015 1045   NA 142 01/30/2015 1330   K 5.0 03/04/2015 1045   K 4.4 01/30/2015 1330   CL 106 01/30/2015 1330   CO2 23 03/04/2015 1045   CO2 27 01/30/2015 1330   BUN 32.4* 03/04/2015 1045   BUN 15 01/30/2015 1330   CREATININE 2.5* 03/04/2015 1045   CREATININE 1.43* 01/30/2015 1330   CREATININE 1.02 01/23/2014 1313      Component Value Date/Time   CALCIUM 10.1 03/04/2015 1045   CALCIUM 9.7 01/30/2015 1330   ALKPHOS 174* 03/04/2015 1045   ALKPHOS 108 05/05/2014 2049   AST 20 03/04/2015 1045   AST 20 05/05/2014 2049   ALT 17 03/04/2015 1045   ALT 24 05/05/2014 2049   BILITOT 0.36 03/04/2015 1045   BILITOT 0.5 05/05/2014 2049       RADIOGRAPHIC STUDIES: No results  found.  ASSESSMENT AND PLAN: This is a very pleasant 55 years old African-American male with a stage IV non-small cell lung cancer, squamous cell carcinoma who completed systemic chemotherapy with carboplatin and gemcitabine status post 6 cycles. The patient has tolerated his treatment fairly well with no significant adverse effects except for mild fatigue and nausea. He had evidence for disease progression and the patient was started on treatment with immunotherapy with Nivolumab currently status post 3 cycles. I recommended for him to proceed with cycle #4 today as scheduled. He would come back for follow-up visit in 2 weeks for reevaluation before starting cycle #5 after repeating CT scan of the chest, abdomen and pelvis without contrast for restaging of his disease. He was also given a refill for Tessalon for the cough.  CODE STATUS was discussed with the patient and he would like to continue with full code but  no prolonged resuscitation. He was advised to call immediately if he has any concerning symptoms in the interval. The patient voices understanding of current disease status and treatment options and is in agreement with the current care plan.  All questions were answered. The patient knows to call the clinic with any problems, questions or concerns. We can certainly see the patient much sooner if necessary.  Disclaimer: This note was dictated with voice recognition software. Similar sounding words can inadvertently be transcribed and may not be corrected upon review.

## 2015-03-18 NOTE — Progress Notes (Signed)
Reviewed labs. OK to treat with Creatinine @ 1.6 per Dr. Julien Nordmann.

## 2015-03-18 NOTE — Progress Notes (Signed)
Faxed shower chair rx to [facility]

## 2015-03-18 NOTE — Patient Instructions (Signed)
[place] Discharge Instructions for Patients Receiving Chemotherapy  Today you received the following chemotherapy agents: Nivolumab.  To help prevent nausea and vomiting after your treatment, we encourage you to take your nausea medication: Compazine 10 mg every 6 hours as needed.   If you develop nausea and vomiting that is not controlled by your nausea medication, call the clinic.   BELOW ARE SYMPTOMS THAT SHOULD BE REPORTED IMMEDIATELY:  *FEVER GREATER THAN 100.5 F  *CHILLS WITH OR WITHOUT FEVER  NAUSEA AND VOMITING THAT IS NOT CONTROLLED WITH YOUR NAUSEA MEDICATION  *UNUSUAL SHORTNESS OF BREATH  *UNUSUAL BRUISING OR BLEEDING  TENDERNESS IN MOUTH AND THROAT WITH OR WITHOUT PRESENCE OF ULCERS  *URINARY PROBLEMS  *BOWEL PROBLEMS  UNUSUAL RASH Items with * indicate a potential emergency and should be followed up as soon as possible.  Feel free to call the clinic you have any questions or concerns. The clinic phone number is [phone].  Please show the [place] at check-in to the Emergency Department and triage nurse.

## 2015-03-18 NOTE — Progress Notes (Signed)
Nutrition follow-up completed with patient during chemotherapy for stage IV non-small cell lung cancer. Patient states he has had nausea and vomiting resulting in weight loss. Current weight documented as 172 pounds decreased from 184.8 pounds January 3. Patient consumes Ensure Plus approximately 3 times a day. Patient could benefit from samples and coupons.  Nutrition diagnosis: Unintended weight loss related to nausea and vomiting as evidenced by 12 pound weight loss over 3 weeks.  Intervention:  Educated patient on strategies for eating with nausea and vomiting. Provided fact sheets. Encouraged small frequent meals and snacks. Recommended patient increase Ensure Plus to minimum of 4 times a day Provided one complementary case of Ensure Plus along with coupons and recipes. Teach back method was used.  Contact information was provided.  Monitoring, evaluation, goals: Patient will work to increase oral intake to minimize weight loss.  Next visit: Tuesday, February 14 during infusion.  **Disclaimer: This note was dictated with voice recognition software. Similar sounding words can inadvertently be transcribed and this note may contain transcription errors which may not have been corrected upon publication of note.**

## 2015-03-28 ENCOUNTER — Telehealth: Payer: Self-pay

## 2015-03-28 ENCOUNTER — Other Ambulatory Visit: Payer: Self-pay | Admitting: Internal Medicine

## 2015-03-28 NOTE — Telephone Encounter (Signed)
Returned call to Ms. Ruark re: dry mouth for past few days.  Writer recommended pt use hard candy/ice during the day, push fluids, biotene, increasing moisture with humidifer at night and return call to clinic if no improvement.  She voiced understanding.

## 2015-03-31 ENCOUNTER — Encounter (HOSPITAL_COMMUNITY): Payer: Self-pay

## 2015-03-31 ENCOUNTER — Ambulatory Visit (HOSPITAL_COMMUNITY)
Admission: RE | Admit: 2015-03-31 | Discharge: 2015-03-31 | Disposition: A | Discharge status: Home / Self Care | Payer: Medicare Other | Source: Ambulatory Visit | Attending: Internal Medicine | Admitting: Internal Medicine

## 2015-03-31 DIAGNOSIS — R918 Other nonspecific abnormal finding of lung field: Secondary | ICD-10-CM | POA: Insufficient documentation

## 2015-03-31 DIAGNOSIS — C3491 Malignant neoplasm of unspecified part of right bronchus or lung: Secondary | ICD-10-CM | POA: Diagnosis present

## 2015-04-01 ENCOUNTER — Ambulatory Visit (HOSPITAL_BASED_OUTPATIENT_CLINIC_OR_DEPARTMENT_OTHER): Payer: Medicare Other | Admitting: Internal Medicine

## 2015-04-01 ENCOUNTER — Ambulatory Visit: Payer: Medicare Other

## 2015-04-01 ENCOUNTER — Ambulatory Visit: Payer: Medicare Other | Admitting: Nutrition

## 2015-04-01 ENCOUNTER — Telehealth: Payer: Self-pay | Admitting: *Deleted

## 2015-04-01 ENCOUNTER — Other Ambulatory Visit (HOSPITAL_BASED_OUTPATIENT_CLINIC_OR_DEPARTMENT_OTHER): Payer: Medicare Other

## 2015-04-01 ENCOUNTER — Encounter: Payer: Self-pay | Admitting: Internal Medicine

## 2015-04-01 VITALS — BP 108/72 | HR 114 | Temp 98.1°F | Resp 17 | Ht 71.0 in | Wt 167.6 lb

## 2015-04-01 DIAGNOSIS — R110 Nausea: Secondary | ICD-10-CM

## 2015-04-01 DIAGNOSIS — R5383 Other fatigue: Secondary | ICD-10-CM | POA: Diagnosis not present

## 2015-04-01 DIAGNOSIS — C3432 Malignant neoplasm of lower lobe, left bronchus or lung: Secondary | ICD-10-CM | POA: Diagnosis present

## 2015-04-01 DIAGNOSIS — C3492 Malignant neoplasm of unspecified part of left bronchus or lung: Secondary | ICD-10-CM

## 2015-04-01 DIAGNOSIS — Z5111 Encounter for antineoplastic chemotherapy: Secondary | ICD-10-CM

## 2015-04-01 LAB — COMPREHENSIVE METABOLIC PANEL
ALT: 27 U/L (ref 0–55)
AST: 20 U/L (ref 5–34)
Albumin: 3.2 g/dL — ABNORMAL LOW (ref 3.5–5.0)
Alkaline Phosphatase: 207 U/L — ABNORMAL HIGH (ref 40–150)
Anion Gap: 11 mEq/L (ref 3–11)
BUN: 35.4 mg/dL — AB (ref 7.0–26.0)
CHLORIDE: 104 meq/L (ref 98–109)
CO2: 23 meq/L (ref 22–29)
CREATININE: 2 mg/dL — AB (ref 0.7–1.3)
Calcium: 10.5 mg/dL — ABNORMAL HIGH (ref 8.4–10.4)
EGFR: 43 mL/min/{1.73_m2} — ABNORMAL LOW (ref >90)
GLUCOSE: 142 mg/dL — AB (ref 70–140)
Potassium: 4.7 mEq/L (ref 3.5–5.1)
Sodium: 137 mEq/L (ref 136–145)
Total Bilirubin: 0.38 mg/dL (ref 0.20–1.20)
Total Protein: 9 g/dL — ABNORMAL HIGH (ref 6.4–8.3)

## 2015-04-01 LAB — CBC WITH DIFFERENTIAL/PLATELET
BASO%: 0.5 % (ref 0.0–2.0)
Basophils Absolute: 0.2 10*3/uL — ABNORMAL HIGH (ref 0.0–0.1)
EOS%: 2.8 % (ref 0.0–7.0)
Eosinophils Absolute: 0.8 10*3/uL — ABNORMAL HIGH (ref 0.0–0.5)
HEMATOCRIT: 30.2 % — AB (ref 38.4–49.9)
HGB: 9.5 g/dL — ABNORMAL LOW (ref 13.0–17.1)
LYMPH#: 1.3 10*3/uL (ref 0.9–3.3)
LYMPH%: 4.5 % — AB (ref 14.0–49.0)
MCH: 20.7 pg — ABNORMAL LOW (ref 27.2–33.4)
MCHC: 31.4 g/dL — AB (ref 32.0–36.0)
MCV: 66 fL — ABNORMAL LOW (ref 79.3–98.0)
MONO#: 2.4 10*3/uL — ABNORMAL HIGH (ref 0.1–0.9)
MONO%: 8.1 % (ref 0.0–14.0)
NEUT%: 84.1 % — AB (ref 39.0–75.0)
NEUTROS ABS: 24.5 10*3/uL — AB (ref 1.5–6.5)
Platelets: 377 10*3/uL (ref 140–400)
RBC: 4.57 10*6/uL (ref 4.20–5.82)
RDW: 14.6 % (ref 11.0–14.6)
WBC: 29.2 10*3/uL — AB (ref 4.0–10.3)

## 2015-04-01 MED ORDER — DEXAMETHASONE 4 MG PO TABS: ORAL_TABLET | ORAL | Status: DC

## 2015-04-01 MED ORDER — LEVOFLOXACIN 500 MG PO TABS: 500.0000 mg | ORAL_TABLET | Freq: Every day | ORAL | Status: DC

## 2015-04-01 NOTE — Progress Notes (Signed)
Nutrition follow-up completed with patient for stage IV non-small cell lung cancer. Patient reports he continues to have nausea and vomiting, especially with coughing. Weight decreased and documented as 167.6 pounds February 14 decreased from 172 pounds January 31. Patient reports no pattern to tolerance of foods. States he tolerates oral nutrition supplements and will drink between 3 and 4 daily. Noticed patient has had disease progression along with fatigue.  Nutrition diagnosis:  Unintended weight loss continues.  Intervention:  Again educated patient on strategies for eating small frequent meals and snacks. Encouraged patient to take medication as prescribed by physician. Recommended patient increase Ensure Plus 4 times a day Provided second complimentary case of Ensure Plus. Teach back method used.  Monitoring, evaluation, goals: Patient will continue to work to increase oral intake for quality-of-life.  Next visit: Tuesday, February 21 during infusion.  **Disclaimer: This note was dictated with voice recognition software. Similar sounding words can inadvertently be transcribed and this note may contain transcription errors which may not have been corrected upon publication of note.**

## 2015-04-01 NOTE — Telephone Encounter (Signed)
Per staff message and POF I have scheduled appts. Advised scheduler of appts. JMW  

## 2015-04-01 NOTE — Progress Notes (Signed)
[facility] [phone]   [phone]  OFFICE PROGRESS NOTE  [name], MD [address] [address]  DIAGNOSIS: Stage IV (T2a, N3, M1b) non-small cell lung cancer, squamous cell carcinoma diagnosed in April 2016.  PRIOR THERAPY:  1) Systemic chemotherapy with carboplatin for AUC of 5 given on day 1 and gemcitabine 1000 MG/M2 on days 1 and 8 every 3 weeks. Status post 6 cycles with partial response. 2) Nivolumab 240 MG IV every 2 weeks status post 4 cycles, last dose was given 03/18/2015 discontinued today secondary to disease progression.  CURRENT THERAPY: Systemic chemotherapy with docetaxel 75 MG/M2 and Cyramza 10 MG/KG every 3 weeks, first dose 04/08/2015.  CODE STATUS: Full code initially but no prolonged resuscitation   INTERVAL HISTORY: Keith Holloway 55 y.o. male returns to the clinic today for follow-up visit accompanied by his wife and daughter. The patient recently completed 4 cycles of immunotherapy with Nivolumab. He is tolerating his treatment well. He continues to have occasional pain on the left lower rib cage of the chest as well as some dysphagia to solid food. He continues to have persistent fatigue and occasional nausea. He continues to have shortness breath with exertion with mild cough and no hemoptysis. He denied having any significant weight loss or night sweats. The patient denied having any fever or chills. He has no nausea or vomiting. He had repeat CT scan of the chest performed recently and he is here for evaluation and discussion of his scan results and treatment options.  MEDICAL HISTORY: Past Medical History  Diagnosis Date  . Diabetes mellitus without complication ([place])   . Hypertension   . High cholesterol   . Migraine   . Depression   . Arthritis   . Alcohol abuse   . Polysubstance abuse     alcohol and cocaine  . Polyneuropathy in diabetes(357.2)   . Memory disorder 08/22/2013  . CHF  (congestive heart failure) ([place])   . Tobacco abuse counseling 10/09/2014  . Legally blind   . Cancer ([place])     Lung Cancer    ALLERGIES:  has No Known Allergies.  MEDICATIONS:  Current Outpatient Prescriptions  Medication Sig Dispense Refill  . aspirin EC 81 MG tablet Take 1 tablet by mouth daily.    . benzonatate (TESSALON) 100 MG capsule TAKE ONE CAPSULE EVERY 8 HOURS 21 capsule 0  . cholecalciferol (VITAMIN D) 1000 units tablet Take 1,000 Units by mouth daily.    . Ferrous Sulfate (IRON) 325 (65 Fe) MG TABS Take 1 tablet by mouth daily.    Marland Kitchen HYDROcodone-acetaminophen (NORCO/VICODIN) 5-325 MG tablet Take 2 tablets by mouth every 6 (six) hours as needed. 60 tablet 0  . insulin NPH-regular Human (NOVOLIN 70/30) (70-30) 100 UNIT/ML injection Inject 35 Units into the skin 2 (two) times daily with a meal. 10 mL 11  . lidocaine-prilocaine (EMLA) cream Apply 1 application topically as needed. 30 g 0  . losartan-hydrochlorothiazide (HYZAAR) 100-25 MG per tablet Take 1 tablet by mouth daily.  2  . metFORMIN (GLUCOPHAGE) 1000 MG tablet Take 1 tablet (1,000 mg total) by mouth 2 (two) times daily. 60 tablet 12  . nicotine (NICODERM CQ - DOSED IN MG/24 HOURS) 14 mg/24hr patch PLACE 1 PATCH (14 MG TOTAL) ONTO THE SKIN DAILY. 28 patch 0  . prochlorperazine (COMPAZINE) 10 MG tablet Take 1 tablet (10 mg total) by mouth every 6 (six) hours as needed for nausea or vomiting.  30 tablet 0  . simvastatin (ZOCOR) 40 MG tablet Take 1 tablet (40 mg total) by mouth daily. 30 tablet 3  . vitamin C (ASCORBIC ACID) 500 MG tablet Take 500 mg by mouth daily.     No current facility-administered medications for this visit.    SURGICAL HISTORY:  Past Surgical History  Procedure Laterality Date  . Eye surgery  04/2012    detatched retna, bilateral  . Eye surgery  08/2012  . Cataract extraction      bilaterally  . Retinol detachment Bilateral 2014    REVIEW OF SYSTEMS:  Constitutional: positive for  fatigue Eyes: negative Ears, nose, mouth, throat, and face: negative Respiratory: positive for dyspnea on exertion and pleurisy/chest pain Cardiovascular: negative Gastrointestinal: negative Genitourinary:negative Integument/breast: negative Hematologic/lymphatic: negative Musculoskeletal:positive for arthralgias Neurological: negative Behavioral/Psych: negative Endocrine: negative Allergic/Immunologic: negative   PHYSICAL EXAMINATION: General appearance: alert, cooperative, fatigued and no distress Head: Normocephalic, without obvious abnormality, atraumatic Neck: no adenopathy, no JVD, supple, symmetrical, trachea midline and thyroid not enlarged, symmetric, no tenderness/mass/nodules Lymph nodes: Cervical, supraclavicular, and axillary nodes normal. Resp: clear to auscultation bilaterally Back: symmetric, no curvature. ROM normal. No CVA tenderness. Cardio: regular rate and rhythm, S1, S2 normal, no murmur, click, rub or gallop GI: soft, non-tender; bowel sounds normal; no masses,  no organomegaly Extremities: extremities normal, atraumatic, no cyanosis or edema Neurologic: Alert and oriented X 3, normal strength and tone. Normal symmetric reflexes. Normal coordination and gait  ECOG PERFORMANCE STATUS: 1 - Symptomatic but completely ambulatory  Blood pressure 108/72, pulse 114, temperature 98.1 F (36.7 C), temperature source Oral, resp. rate 17, height '5\' 11"'$  (1.803 m), weight 167 lb 9.6 oz (76.023 kg), SpO2 97 %.  LABORATORY DATA: Lab Results  Component Value Date   WBC 29.2* 04/01/2015   HGB 9.5* 04/01/2015   HCT 30.2* 04/01/2015   MCV 66.0* 04/01/2015   PLT 377 04/01/2015      Chemistry      Component Value Date/Time   NA 137 04/01/2015 1107   NA 142 01/30/2015 1330   K 4.7 04/01/2015 1107   K 4.4 01/30/2015 1330   CL 106 01/30/2015 1330   CO2 23 04/01/2015 1107   CO2 27 01/30/2015 1330   BUN 35.4* 04/01/2015 1107   BUN 15 01/30/2015 1330   CREATININE 2.0*  04/01/2015 1107   CREATININE 1.43* 01/30/2015 1330   CREATININE 1.02 01/23/2014 1313      Component Value Date/Time   CALCIUM 10.5* 04/01/2015 1107   CALCIUM 9.7 01/30/2015 1330   ALKPHOS 207* 04/01/2015 1107   ALKPHOS 108 05/05/2014 2049   AST 20 04/01/2015 1107   AST 20 05/05/2014 2049   ALT 27 04/01/2015 1107   ALT 24 05/05/2014 2049   BILITOT 0.38 04/01/2015 1107   BILITOT 0.5 05/05/2014 2049       RADIOGRAPHIC STUDIES: Ct Abdomen Pelvis Wo Contrast  03/31/2015  CLINICAL DATA:  Non-small cell lung cancer.  Restaging. EXAM: CT CHEST, ABDOMEN AND PELVIS WITHOUT CONTRAST TECHNIQUE: Multidetector CT imaging of the chest, abdomen and pelvis was performed following the standard protocol without IV contrast. COMPARISON:  10/30/2014 FINDINGS: CT CHEST Mediastinum: Increase in size of anterior mediastinal mass which now measures 4.7 x 8.9 cm, image 26 of series 2. Previously 3.5 x 6.3 cm. New enlarged sub- carinal lymph node measures 1.7 cm, image 32 of series 2. The left lower lobe perihilar lung mass measures 5.6 cm, image 39 of series 2. Previously 1.9 cm. There is associated  new postobstructive atelectasis/pneumonitis involving the left lower lobe, image 49 of series 2. Lungs/Pleura: New left pleural effusion. Innumerable pulmonary nodules are identified throughout both lungs. This demonstrates significant interval progression when compared with 10/30/2014. New perifissural lung mass within the left lung measures 2.3 cm, image 23 of series 4. Within the left upper lobe there is a new medial subpleural nodule measuring 2.7 cm, image 23 of series 4. New left lower lobe nodule measures 1 cm, image 40 of series 4. Musculoskeletal: No aggressive lytic or sclerotic bone lesions identified. CT ABDOMEN AND PELVIS Hepatobiliary: Within the lateral right lobe of liver there is a new suspicious lesion measuring 2.9 cm, image 50 of series 2. The gallbladder appears normal. There is no biliary dilatation.  Pancreas: Negative Spleen: The spleen appears normal. Adrenals/Urinary Tract: Normal adrenal glands. The kidneys are unremarkable. The urinary bladder appears normal. Stomach/Bowel: The stomach and the small bowel loops have a normal course and caliber. No out obstruction. Normal appearance of the colon. Vascular/Lymphatic: Calcified atherosclerotic disease involves the abdominal aorta. No aneurysm. No upper abdominal adenopathy identified. No enlarged pelvic or inguinal lymph nodes. Reproductive: Normal appearance of the prostate gland and seminal vesicles. Other: There is no ascites or focal fluid collections within the abdomen or pelvis. Musculoskeletal: No aggressive lytic or sclerotic bone lesions. IMPRESSION: 1. Interval progression of disease when compared with 10/30/2014 2. Increase in size of dominant anterior mediastinal mass and left lower lobe perihilar mass. There has also been development of new sub- carinal adenopathy. 3. Multiple new pulmonary nodules are identified throughout both lungs compatible with metastatic disease. 4. New low density structure within right lobe of liver is also worrisome for metastatic disease. Electronically Signed   By: Kerby Moors M.D.   On: 03/31/2015 09:38   Ct Chest Wo Contrast  03/31/2015  CLINICAL DATA:  Non-small cell lung cancer.  Restaging. EXAM: CT CHEST, ABDOMEN AND PELVIS WITHOUT CONTRAST TECHNIQUE: Multidetector CT imaging of the chest, abdomen and pelvis was performed following the standard protocol without IV contrast. COMPARISON:  10/30/2014 FINDINGS: CT CHEST Mediastinum: Increase in size of anterior mediastinal mass which now measures 4.7 x 8.9 cm, image 26 of series 2. Previously 3.5 x 6.3 cm. New enlarged sub- carinal lymph node measures 1.7 cm, image 32 of series 2. The left lower lobe perihilar lung mass measures 5.6 cm, image 39 of series 2. Previously 1.9 cm. There is associated new postobstructive atelectasis/pneumonitis involving the left  lower lobe, image 49 of series 2. Lungs/Pleura: New left pleural effusion. Innumerable pulmonary nodules are identified throughout both lungs. This demonstrates significant interval progression when compared with 10/30/2014. New perifissural lung mass within the left lung measures 2.3 cm, image 23 of series 4. Within the left upper lobe there is a new medial subpleural nodule measuring 2.7 cm, image 23 of series 4. New left lower lobe nodule measures 1 cm, image 40 of series 4. Musculoskeletal: No aggressive lytic or sclerotic bone lesions identified. CT ABDOMEN AND PELVIS Hepatobiliary: Within the lateral right lobe of liver there is a new suspicious lesion measuring 2.9 cm, image 50 of series 2. The gallbladder appears normal. There is no biliary dilatation. Pancreas: Negative Spleen: The spleen appears normal. Adrenals/Urinary Tract: Normal adrenal glands. The kidneys are unremarkable. The urinary bladder appears normal. Stomach/Bowel: The stomach and the small bowel loops have a normal course and caliber. No out obstruction. Normal appearance of the colon. Vascular/Lymphatic: Calcified atherosclerotic disease involves the abdominal aorta. No aneurysm. No upper  abdominal adenopathy identified. No enlarged pelvic or inguinal lymph nodes. Reproductive: Normal appearance of the prostate gland and seminal vesicles. Other: There is no ascites or focal fluid collections within the abdomen or pelvis. Musculoskeletal: No aggressive lytic or sclerotic bone lesions. IMPRESSION: 1. Interval progression of disease when compared with 10/30/2014 2. Increase in size of dominant anterior mediastinal mass and left lower lobe perihilar mass. There has also been development of new sub- carinal adenopathy. 3. Multiple new pulmonary nodules are identified throughout both lungs compatible with metastatic disease. 4. New low density structure within right lobe of liver is also worrisome for metastatic disease. Electronically Signed    By: Kerby Moors M.D.   On: 03/31/2015 09:38    ASSESSMENT AND PLAN: This is a very pleasant 55 years old African-American male with a stage IV non-small cell lung cancer, squamous cell carcinoma who completed systemic chemotherapy with carboplatin and gemcitabine status post 6 cycles. The patient has tolerated his treatment fairly well with no significant adverse effects except for mild fatigue and nausea. He had evidence for disease progression and the patient was started on treatment with immunotherapy with Nivolumab currently status post 4 cycles. Unfortunately the recent CT scan of the chest, abdomen and pelvis showed interval progression of his disease with increase in the size of the dominant anterior mediastinal mass and left lower lobe perihilar mass. There was also development of new subcarinal adenopathy and multiple new pulmonary nodules as well as questionable liver lesion. I discussed the scan results with the patient and his family. I gave him the option of palliative care and hospice referral versus consideration of treatment with systemic chemotherapy with docetaxel 75 MG/M2 and Cyramza 10 MG/KG every 3 weeks with Neulasta support. The patient is interested in systemic chemotherapy. I discussed with him the adverse effect of this treatment including but not limited to alopecia, myelosuppression, nausea and vomiting, peripheral neuropathy, liver or renal dysfunction as well as risk of pulmonary hemorrhage GI perforation and wound healing delay. He is expected to start the first cycle of this treatment next week. I will call his pharmacy with Decadron 8 mg by mouth twice a day the day before, day of and day after the chemotherapy for premedication. For the persistent leukocytosis and questionable underlying infection, I will start the patient empirically on Levaquin 500 mg by mouth daily for 7 days. He would come back for follow-up visit in 4 weeks for reevaluation with the start of cycle  #2 of his systemic chemotherapy.  CODE STATUS was discussed with the patient and he would like to continue with full code but no prolonged resuscitation. He was advised to call immediately if he has any concerning symptoms in the interval. The patient voices understanding of current disease status and treatment options and is in agreement with the current care plan.  All questions were answered. The patient knows to call the clinic with any problems, questions or concerns. We can certainly see the patient much sooner if necessary.  Disclaimer: This note was dictated with voice recognition software. Similar sounding words can inadvertently be transcribed and may not be corrected upon review.

## 2015-04-07 ENCOUNTER — Telehealth: Payer: Self-pay | Admitting: Internal Medicine

## 2015-04-07 ENCOUNTER — Telehealth: Payer: Self-pay | Admitting: *Deleted

## 2015-04-07 NOTE — Telephone Encounter (Signed)
"  I've given him the dexamethasone and am not sure if he is scheduled for infusion tomorrow.  When is he scheduled?"  Appointment for infusion scheduled at 11:00 am.  We were told to be there at 0900.  How long will the infusion take?  It will be better if the lab can be moved."  Will enter P.O.F. For later lab appointment time.  Return number [phone].

## 2015-04-07 NOTE — Telephone Encounter (Signed)
Spoke with pt wife to make them aware of lab appt time change from 9 am to 10 am 2/21 per 2/20 pof

## 2015-04-08 ENCOUNTER — Ambulatory Visit: Payer: Medicare Other | Admitting: Nutrition

## 2015-04-08 ENCOUNTER — Ambulatory Visit (HOSPITAL_BASED_OUTPATIENT_CLINIC_OR_DEPARTMENT_OTHER): Payer: Medicare Other

## 2015-04-08 ENCOUNTER — Other Ambulatory Visit (HOSPITAL_BASED_OUTPATIENT_CLINIC_OR_DEPARTMENT_OTHER): Payer: Medicare Other

## 2015-04-08 VITALS — BP 148/79 | HR 92 | Temp 97.7°F | Resp 17

## 2015-04-08 DIAGNOSIS — C3492 Malignant neoplasm of unspecified part of left bronchus or lung: Secondary | ICD-10-CM

## 2015-04-08 DIAGNOSIS — C3432 Malignant neoplasm of lower lobe, left bronchus or lung: Secondary | ICD-10-CM | POA: Diagnosis present

## 2015-04-08 DIAGNOSIS — Z5112 Encounter for antineoplastic immunotherapy: Secondary | ICD-10-CM | POA: Diagnosis not present

## 2015-04-08 DIAGNOSIS — Z5111 Encounter for antineoplastic chemotherapy: Secondary | ICD-10-CM

## 2015-04-08 DIAGNOSIS — Z79899 Other long term (current) drug therapy: Secondary | ICD-10-CM | POA: Diagnosis not present

## 2015-04-08 LAB — COMPREHENSIVE METABOLIC PANEL
ALBUMIN: 3.3 g/dL — AB (ref 3.5–5.0)
ALK PHOS: 241 U/L — AB (ref 40–150)
ALT: 44 U/L (ref 0–55)
AST: 25 U/L (ref 5–34)
Anion Gap: 11 mEq/L (ref 3–11)
BUN: 55.3 mg/dL — ABNORMAL HIGH (ref 7.0–26.0)
CO2: 20 mEq/L — ABNORMAL LOW (ref 22–29)
Calcium: 10.7 mg/dL — ABNORMAL HIGH (ref 8.4–10.4)
Chloride: 102 mEq/L (ref 98–109)
Creatinine: 2.3 mg/dL — ABNORMAL HIGH (ref 0.7–1.3)
EGFR: 35 mL/min/{1.73_m2} — AB (ref >90)
GLUCOSE: 195 mg/dL — AB (ref 70–140)
SODIUM: 134 meq/L — AB (ref 136–145)
Total Bilirubin: 0.35 mg/dL (ref 0.20–1.20)
Total Protein: 9.4 g/dL — ABNORMAL HIGH (ref 6.4–8.3)

## 2015-04-08 LAB — UA PROTEIN, DIPSTICK - CHCC: Protein, ur: NEGATIVE mg/dL

## 2015-04-08 LAB — CBC WITH DIFFERENTIAL/PLATELET
BASO%: 0.1 % (ref 0.0–2.0)
BASOS ABS: 0 10*3/uL (ref 0.0–0.1)
EOS ABS: 0 10*3/uL (ref 0.0–0.5)
EOS%: 0 % (ref 0.0–7.0)
HCT: 28.4 % — ABNORMAL LOW (ref 38.4–49.9)
HEMOGLOBIN: 9.7 g/dL — AB (ref 13.0–17.1)
LYMPH%: 2.7 % — AB (ref 14.0–49.0)
MCH: 21.7 pg — AB (ref 27.2–33.4)
MCHC: 34.2 g/dL (ref 32.0–36.0)
MCV: 63.4 fL — AB (ref 79.3–98.0)
MONO#: 0.9 10*3/uL (ref 0.1–0.9)
MONO%: 2.6 % (ref 0.0–14.0)
NEUT#: 33.1 10*3/uL — ABNORMAL HIGH (ref 1.5–6.5)
NEUT%: 94.6 % — ABNORMAL HIGH (ref 39.0–75.0)
Platelets: 504 10*3/uL — ABNORMAL HIGH (ref 140–400)
RBC: 4.48 10*6/uL (ref 4.20–5.82)
RDW: 14.8 % — ABNORMAL HIGH (ref 11.0–14.6)
WBC: 35 10*3/uL — ABNORMAL HIGH (ref 4.0–10.3)
lymph#: 1 10*3/uL (ref 0.9–3.3)

## 2015-04-08 MED ORDER — ACETAMINOPHEN 325 MG PO TABS
650.0000 mg | ORAL_TABLET | Freq: Once | ORAL | Status: AC
  Administered 2015-04-08: 650 mg via ORAL

## 2015-04-08 MED ORDER — RAMUCIRUMAB CHEMO INJECTION 500 MG/50ML
10.0000 mg/kg | Freq: Once | INTRAVENOUS | Status: AC
  Administered 2015-04-08: 800 mg via INTRAVENOUS
  Filled 2015-04-08: qty 60

## 2015-04-08 MED ORDER — ACETAMINOPHEN 325 MG PO TABS
ORAL_TABLET | ORAL | Status: AC
  Filled 2015-04-08: qty 2

## 2015-04-08 MED ORDER — HEPARIN SOD (PORK) LOCK FLUSH 100 UNIT/ML IV SOLN
500.0000 [IU] | Freq: Once | INTRAVENOUS | Status: AC | PRN
  Administered 2015-04-08: 500 [IU]
  Filled 2015-04-08: qty 5

## 2015-04-08 MED ORDER — DIPHENHYDRAMINE HCL 50 MG/ML IJ SOLN
50.0000 mg | Freq: Once | INTRAMUSCULAR | Status: AC
  Administered 2015-04-08: 50 mg via INTRAVENOUS

## 2015-04-08 MED ORDER — SODIUM CHLORIDE 0.9 % IV SOLN
Freq: Once | INTRAVENOUS | Status: AC
  Administered 2015-04-08: 12:00:00 via INTRAVENOUS

## 2015-04-08 MED ORDER — DIPHENHYDRAMINE HCL 50 MG/ML IJ SOLN
INTRAMUSCULAR | Status: AC
  Filled 2015-04-08: qty 1

## 2015-04-08 MED ORDER — SODIUM POLYSTYRENE SULFONATE PO POWD: Freq: Once | ORAL | Status: DC

## 2015-04-08 MED ORDER — SODIUM CHLORIDE 0.9 % IV SOLN
10.0000 mg | Freq: Once | INTRAVENOUS | Status: AC
  Administered 2015-04-08: 10 mg via INTRAVENOUS
  Filled 2015-04-08: qty 1

## 2015-04-08 MED ORDER — SODIUM CHLORIDE 0.9% FLUSH
10.0000 mL | INTRAVENOUS | Status: DC | PRN
  Administered 2015-04-08: 10 mL
  Filled 2015-04-08: qty 10

## 2015-04-08 MED ORDER — DEXTROSE 5 % IV SOLN
75.0000 mg/m2 | Freq: Once | INTRAVENOUS | Status: AC
  Administered 2015-04-08: 150 mg via INTRAVENOUS
  Filled 2015-04-08: qty 15

## 2015-04-08 NOTE — Progress Notes (Signed)
Nutrition follow-up completed with patient during infusion for stage IV non-small cell lung cancer. Weight is generally stable and was documented as 166.4 pounds. Patient has been consuming oral nutrition supplements without difficulty.  Nutrition diagnosis: Unintended weight loss continues.  Intervention: Encouraged patient to continue strategies for small frequent meals with snacks. Recommended Ensure Plus 4 times a day. Questions were answered.  Teach back method was used.  Monitoring, evaluation, goals: Patient will continue to work to increase oral intake for quality-of-life.  Next visit: Tuesday, March 14, during infusion.  **Disclaimer: This note was dictated with voice recognition software. Similar sounding words can inadvertently be transcribed and this note may contain transcription errors which may not have been corrected upon publication of note.**

## 2015-04-08 NOTE — Patient Instructions (Signed)
[facility] Discharge Instructions for Patients Receiving Chemotherapy  Today you received the following chemotherapy agents Cyramza and Taxotere To help prevent nausea and vomiting after your treatment, we encourage you to take your nausea medication as directed   If you develop nausea and vomiting that is not controlled by your nausea medication, call the clinic.   BELOW ARE SYMPTOMS THAT SHOULD BE REPORTED IMMEDIATELY:  *FEVER GREATER THAN 100.5 F  *CHILLS WITH OR WITHOUT FEVER  NAUSEA AND VOMITING THAT IS NOT CONTROLLED WITH YOUR NAUSEA MEDICATION  *UNUSUAL SHORTNESS OF BREATH  *UNUSUAL BRUISING OR BLEEDING  TENDERNESS IN MOUTH AND THROAT WITH OR WITHOUT PRESENCE OF ULCERS  *URINARY PROBLEMS  *BOWEL PROBLEMS  UNUSUAL RASH Items with * indicate a potential emergency and should be followed up as soon as possible.  Feel free to call the clinic you have any questions or concerns. The clinic phone number is [phone].  Please show the CHEMO ALERT CARD at check-in to the Emergency Department and triage nurse.   

## 2015-04-08 NOTE — Progress Notes (Signed)
Per [facility] RN, okay to tx per Dr. Julien Nordmann with creatinine- 2.3, K+-5.7, alk phos-241. 1225-pt unable to void at this time for urine protein. Per Dr. Julien Nordmann, okay to proceed with treatment, may obtain urine protein at anytime today during infusion.

## 2015-04-08 NOTE — Progress Notes (Signed)
Pt tolerated treatment well w/o any adverse reaction.   Kayexalate ordered by Dr. Julien Nordmann for elevated K+.  Rx sent to [facility] and wife will pick up on way home.  Pt/wife understand to take medication today and that it will cause some diarrhea.   Ok to cancel Injection appt this week for Neulasta since pt's WBC is elevated.   Pt returns for lab weekly until next treatment.  Dr. Julien Nordmann will reassess need for Neulasta on next treatment.

## 2015-04-09 ENCOUNTER — Other Ambulatory Visit: Payer: Self-pay | Admitting: Internal Medicine

## 2015-04-09 ENCOUNTER — Telehealth: Payer: Self-pay | Admitting: Medical Oncology

## 2015-04-09 DIAGNOSIS — C3492 Malignant neoplasm of unspecified part of left bronchus or lung: Secondary | ICD-10-CM

## 2015-04-09 MED ORDER — HYDROCODONE-ACETAMINOPHEN 5-325 MG PO TABS: 2.0000 | ORAL_TABLET | Freq: Four times a day (QID) | ORAL | Status: DC | PRN

## 2015-04-09 MED ORDER — BENZONATATE 100 MG PO CAPS: ORAL_CAPSULE | ORAL | Status: DC

## 2015-04-09 NOTE — Telephone Encounter (Signed)
Requesting tessalon, and hydrocodone/norco and compazine refills. Wife can pick up rx tomorrow early afternoon

## 2015-04-09 NOTE — Addendum Note (Signed)
Addended by: Ardeen Garland on: 04/09/2015 03:47 PM   Modules accepted: Orders, Medications

## 2015-04-09 NOTE — Telephone Encounter (Signed)
Per wife , Pt doing well except he is having diarrhea after kaopectate for hyperkalemia. I told her to call with any concerns.

## 2015-04-09 NOTE — Telephone Encounter (Signed)
rx locked up and compazine escribed to pt pharmacy

## 2015-04-10 ENCOUNTER — Ambulatory Visit: Payer: Medicare Other

## 2015-04-15 ENCOUNTER — Ambulatory Visit: Payer: Medicare Other | Admitting: Internal Medicine

## 2015-04-15 ENCOUNTER — Other Ambulatory Visit: Payer: Medicare Other

## 2015-04-15 ENCOUNTER — Ambulatory Visit: Payer: Medicare Other

## 2015-04-15 ENCOUNTER — Other Ambulatory Visit (HOSPITAL_BASED_OUTPATIENT_CLINIC_OR_DEPARTMENT_OTHER): Payer: Medicare Other

## 2015-04-15 DIAGNOSIS — C3492 Malignant neoplasm of unspecified part of left bronchus or lung: Secondary | ICD-10-CM

## 2015-04-15 DIAGNOSIS — C3432 Malignant neoplasm of lower lobe, left bronchus or lung: Secondary | ICD-10-CM | POA: Diagnosis present

## 2015-04-15 LAB — COMPREHENSIVE METABOLIC PANEL
ALBUMIN: 3.2 g/dL — AB (ref 3.5–5.0)
ALK PHOS: 182 U/L — AB (ref 40–150)
ALT: 51 U/L (ref 0–55)
ANION GAP: 7 meq/L (ref 3–11)
AST: 28 U/L (ref 5–34)
BILIRUBIN TOTAL: 0.41 mg/dL (ref 0.20–1.20)
BUN: 27 mg/dL — AB (ref 7.0–26.0)
CALCIUM: 9.5 mg/dL (ref 8.4–10.4)
CHLORIDE: 106 meq/L (ref 98–109)
CO2: 22 mEq/L (ref 22–29)
CREATININE: 1.4 mg/dL — AB (ref 0.7–1.3)
EGFR: 64 mL/min/{1.73_m2} — ABNORMAL LOW (ref >90)
Glucose: 190 mg/dl — ABNORMAL HIGH (ref 70–140)
Potassium: 5.2 mEq/L — ABNORMAL HIGH (ref 3.5–5.1)
Sodium: 135 mEq/L — ABNORMAL LOW (ref 136–145)
TOTAL PROTEIN: 8.1 g/dL (ref 6.4–8.3)

## 2015-04-15 LAB — CBC WITH DIFFERENTIAL/PLATELET
BASO%: 0.7 % (ref 0.0–2.0)
Basophils Absolute: 0 10*3/uL (ref 0.0–0.1)
EOS%: 2.7 % (ref 0.0–7.0)
Eosinophils Absolute: 0.2 10*3/uL (ref 0.0–0.5)
HEMATOCRIT: 31.2 % — AB (ref 38.4–49.9)
HEMOGLOBIN: 10.2 g/dL — AB (ref 13.0–17.1)
LYMPH#: 0.8 10*3/uL — AB (ref 0.9–3.3)
LYMPH%: 13.4 % — ABNORMAL LOW (ref 14.0–49.0)
MCH: 21.1 pg — ABNORMAL LOW (ref 27.2–33.4)
MCHC: 32.6 g/dL (ref 32.0–36.0)
MCV: 64.8 fL — ABNORMAL LOW (ref 79.3–98.0)
MONO#: 0.6 10*3/uL (ref 0.1–0.9)
MONO%: 9.5 % (ref 0.0–14.0)
NEUT%: 73.7 % (ref 39.0–75.0)
NEUTROS ABS: 4.3 10*3/uL (ref 1.5–6.5)
PLATELETS: 355 10*3/uL (ref 140–400)
RBC: 4.81 10*6/uL (ref 4.20–5.82)
RDW: 14.8 % — AB (ref 11.0–14.6)
WBC: 5.9 10*3/uL (ref 4.0–10.3)

## 2015-04-22 ENCOUNTER — Other Ambulatory Visit (HOSPITAL_BASED_OUTPATIENT_CLINIC_OR_DEPARTMENT_OTHER): Payer: Medicare Other

## 2015-04-22 DIAGNOSIS — C3432 Malignant neoplasm of lower lobe, left bronchus or lung: Secondary | ICD-10-CM

## 2015-04-22 DIAGNOSIS — C3492 Malignant neoplasm of unspecified part of left bronchus or lung: Secondary | ICD-10-CM

## 2015-04-22 LAB — COMPREHENSIVE METABOLIC PANEL
ALBUMIN: 3.2 g/dL — AB (ref 3.5–5.0)
ALK PHOS: 210 U/L — AB (ref 40–150)
ALT: 32 U/L (ref 0–55)
ANION GAP: 8 meq/L (ref 3–11)
AST: 20 U/L (ref 5–34)
BILIRUBIN TOTAL: 0.3 mg/dL (ref 0.20–1.20)
BUN: 9.8 mg/dL (ref 7.0–26.0)
CALCIUM: 9.7 mg/dL (ref 8.4–10.4)
CO2: 25 mEq/L (ref 22–29)
Chloride: 105 mEq/L (ref 98–109)
Creatinine: 1.1 mg/dL (ref 0.7–1.3)
EGFR: 87 mL/min/{1.73_m2} — AB (ref >90)
GLUCOSE: 234 mg/dL — AB (ref 70–140)
POTASSIUM: 4.3 meq/L (ref 3.5–5.1)
Sodium: 139 mEq/L (ref 136–145)
TOTAL PROTEIN: 7.9 g/dL (ref 6.4–8.3)

## 2015-04-22 LAB — CBC WITH DIFFERENTIAL/PLATELET
BASO%: 0.8 % (ref 0.0–2.0)
Basophils Absolute: 0.1 10*3/uL (ref 0.0–0.1)
EOS%: 1.3 % (ref 0.0–7.0)
Eosinophils Absolute: 0.2 10*3/uL (ref 0.0–0.5)
HEMATOCRIT: 32 % — AB (ref 38.4–49.9)
HGB: 10.2 g/dL — ABNORMAL LOW (ref 13.0–17.1)
LYMPH#: 1.3 10*3/uL (ref 0.9–3.3)
LYMPH%: 8.8 % — AB (ref 14.0–49.0)
MCH: 21 pg — ABNORMAL LOW (ref 27.2–33.4)
MCHC: 32 g/dL (ref 32.0–36.0)
MCV: 65.6 fL — ABNORMAL LOW (ref 79.3–98.0)
MONO#: 2 10*3/uL — ABNORMAL HIGH (ref 0.1–0.9)
MONO%: 13.8 % (ref 0.0–14.0)
NEUT#: 10.8 10*3/uL — ABNORMAL HIGH (ref 1.5–6.5)
NEUT%: 75.3 % — AB (ref 39.0–75.0)
Platelets: 395 10*3/uL (ref 140–400)
RBC: 4.87 10*6/uL (ref 4.20–5.82)
RDW: 15.6 % — ABNORMAL HIGH (ref 11.0–14.6)
WBC: 14.4 10*3/uL — ABNORMAL HIGH (ref 4.0–10.3)

## 2015-04-24 ENCOUNTER — Telehealth: Payer: Self-pay | Admitting: *Deleted

## 2015-04-24 DIAGNOSIS — C3492 Malignant neoplasm of unspecified part of left bronchus or lung: Secondary | ICD-10-CM

## 2015-04-24 MED ORDER — PROCHLORPERAZINE MALEATE 10 MG PO TABS: ORAL_TABLET | ORAL | Status: DC

## 2015-04-24 NOTE — Telephone Encounter (Signed)
"  Dawit needs a refil on the prochlorperazine.  I give this to him twice a day to prevent him from being nauseated.  He couldn't keep anything down the first two weeks after treatment.  Has not been nauseated the past week so it's working."  Will send refill to pharmacy.  Advised nausea is resolved and not to take until first report or sign of nausea.  Will send to pharmacy and this medicine type is not one he ned to pick up and retail pharmacy can be called with futuire refill requests.   Returned call to verify pharmacy.  Updated patient pharmacy preference list at this time.

## 2015-04-29 ENCOUNTER — Ambulatory Visit: Payer: Medicare Other | Admitting: Nutrition

## 2015-04-29 ENCOUNTER — Encounter: Payer: Self-pay | Admitting: Internal Medicine

## 2015-04-29 ENCOUNTER — Other Ambulatory Visit (HOSPITAL_BASED_OUTPATIENT_CLINIC_OR_DEPARTMENT_OTHER): Payer: Medicare Other

## 2015-04-29 ENCOUNTER — Telehealth: Payer: Self-pay | Admitting: Internal Medicine

## 2015-04-29 ENCOUNTER — Telehealth: Payer: Self-pay | Admitting: *Deleted

## 2015-04-29 ENCOUNTER — Other Ambulatory Visit: Payer: Self-pay | Admitting: Medical Oncology

## 2015-04-29 ENCOUNTER — Ambulatory Visit (HOSPITAL_BASED_OUTPATIENT_CLINIC_OR_DEPARTMENT_OTHER): Payer: Medicare Other

## 2015-04-29 ENCOUNTER — Ambulatory Visit (HOSPITAL_BASED_OUTPATIENT_CLINIC_OR_DEPARTMENT_OTHER): Payer: Medicare Other | Admitting: Internal Medicine

## 2015-04-29 VITALS — BP 141/78 | HR 105 | Temp 97.8°F | Resp 18 | Ht 71.0 in | Wt 160.6 lb

## 2015-04-29 DIAGNOSIS — Z79899 Other long term (current) drug therapy: Secondary | ICD-10-CM

## 2015-04-29 DIAGNOSIS — E860 Dehydration: Secondary | ICD-10-CM | POA: Diagnosis not present

## 2015-04-29 DIAGNOSIS — N289 Disorder of kidney and ureter, unspecified: Secondary | ICD-10-CM

## 2015-04-29 DIAGNOSIS — Z5112 Encounter for antineoplastic immunotherapy: Secondary | ICD-10-CM

## 2015-04-29 DIAGNOSIS — C3432 Malignant neoplasm of lower lobe, left bronchus or lung: Secondary | ICD-10-CM | POA: Diagnosis not present

## 2015-04-29 DIAGNOSIS — C3490 Malignant neoplasm of unspecified part of unspecified bronchus or lung: Secondary | ICD-10-CM | POA: Diagnosis present

## 2015-04-29 DIAGNOSIS — C3492 Malignant neoplasm of unspecified part of left bronchus or lung: Secondary | ICD-10-CM

## 2015-04-29 DIAGNOSIS — Z5111 Encounter for antineoplastic chemotherapy: Secondary | ICD-10-CM | POA: Diagnosis not present

## 2015-04-29 DIAGNOSIS — C342 Malignant neoplasm of middle lobe, bronchus or lung: Secondary | ICD-10-CM

## 2015-04-29 DIAGNOSIS — N182 Chronic kidney disease, stage 2 (mild): Secondary | ICD-10-CM

## 2015-04-29 LAB — CBC WITH DIFFERENTIAL/PLATELET
BASO%: 0.4 % (ref 0.0–2.0)
BASOS ABS: 0.1 10*3/uL (ref 0.0–0.1)
EOS%: 0.1 % (ref 0.0–7.0)
Eosinophils Absolute: 0 10*3/uL (ref 0.0–0.5)
HCT: 32.4 % — ABNORMAL LOW (ref 38.4–49.9)
HEMOGLOBIN: 10.2 g/dL — AB (ref 13.0–17.1)
LYMPH#: 1 10*3/uL (ref 0.9–3.3)
LYMPH%: 6.1 % — ABNORMAL LOW (ref 14.0–49.0)
MCH: 20.8 pg — AB (ref 27.2–33.4)
MCHC: 31.5 g/dL — AB (ref 32.0–36.0)
MCV: 66 fL — ABNORMAL LOW (ref 79.3–98.0)
MONO#: 0.9 10*3/uL (ref 0.1–0.9)
MONO%: 5 % (ref 0.0–14.0)
NEUT#: 15.1 10*3/uL — ABNORMAL HIGH (ref 1.5–6.5)
NEUT%: 88.4 % — ABNORMAL HIGH (ref 39.0–75.0)
Platelets: 432 10*3/uL — ABNORMAL HIGH (ref 140–400)
RBC: 4.91 10*6/uL (ref 4.20–5.82)
RDW: 15.2 % — AB (ref 11.0–14.6)
WBC: 17.1 10*3/uL — ABNORMAL HIGH (ref 4.0–10.3)

## 2015-04-29 LAB — COMPREHENSIVE METABOLIC PANEL
ALBUMIN: 3.6 g/dL (ref 3.5–5.0)
ALT: 22 U/L (ref 0–55)
AST: 14 U/L (ref 5–34)
Alkaline Phosphatase: 166 U/L — ABNORMAL HIGH (ref 40–150)
Anion Gap: 9 mEq/L (ref 3–11)
BUN: 25.3 mg/dL (ref 7.0–26.0)
CHLORIDE: 104 meq/L (ref 98–109)
CO2: 25 meq/L (ref 22–29)
Calcium: 9.9 mg/dL (ref 8.4–10.4)
Creatinine: 2.3 mg/dL — ABNORMAL HIGH (ref 0.7–1.3)
EGFR: 36 mL/min/{1.73_m2} — AB (ref >90)
GLUCOSE: 259 mg/dL — AB (ref 70–140)
POTASSIUM: 4.9 meq/L (ref 3.5–5.1)
SODIUM: 138 meq/L (ref 136–145)
Total Bilirubin: 0.34 mg/dL (ref 0.20–1.20)
Total Protein: 8.3 g/dL (ref 6.4–8.3)

## 2015-04-29 LAB — UA PROTEIN, DIPSTICK - CHCC: Protein, ur: NEGATIVE mg/dL

## 2015-04-29 MED ORDER — ACETAMINOPHEN 325 MG PO TABS
650.0000 mg | ORAL_TABLET | Freq: Once | ORAL | Status: AC
  Administered 2015-04-29: 650 mg via ORAL

## 2015-04-29 MED ORDER — ACETAMINOPHEN 325 MG PO TABS
ORAL_TABLET | ORAL | Status: AC
  Filled 2015-04-29: qty 1

## 2015-04-29 MED ORDER — SODIUM CHLORIDE 0.9 % IV SOLN
10.0000 mg | Freq: Once | INTRAVENOUS | Status: AC
  Administered 2015-04-29: 10 mg via INTRAVENOUS
  Filled 2015-04-29: qty 1

## 2015-04-29 MED ORDER — DIPHENHYDRAMINE HCL 50 MG/ML IJ SOLN
INTRAMUSCULAR | Status: AC
  Filled 2015-04-29: qty 1

## 2015-04-29 MED ORDER — DIPHENHYDRAMINE HCL 50 MG/ML IJ SOLN
50.0000 mg | Freq: Once | INTRAMUSCULAR | Status: AC
  Administered 2015-04-29: 50 mg via INTRAVENOUS

## 2015-04-29 MED ORDER — ONDANSETRON HCL 8 MG PO TABS: 8.0000 mg | ORAL_TABLET | Freq: Three times a day (TID) | ORAL | Status: DC | PRN

## 2015-04-29 MED ORDER — DOCETAXEL CHEMO INJECTION 160 MG/16ML
75.0000 mg/m2 | Freq: Once | INTRAVENOUS | Status: AC
  Administered 2015-04-29: 150 mg via INTRAVENOUS
  Filled 2015-04-29: qty 15

## 2015-04-29 MED ORDER — SODIUM CHLORIDE 0.9 % IV SOLN
10.0000 mg/kg | Freq: Once | INTRAVENOUS | Status: AC
  Administered 2015-04-29: 800 mg via INTRAVENOUS
  Filled 2015-04-29: qty 70

## 2015-04-29 MED ORDER — SODIUM CHLORIDE 0.9 % IV SOLN
Freq: Once | INTRAVENOUS | Status: AC
  Administered 2015-04-29: 12:00:00 via INTRAVENOUS

## 2015-04-29 MED ORDER — SODIUM CHLORIDE 0.9% FLUSH
10.0000 mL | INTRAVENOUS | Status: DC | PRN
  Administered 2015-04-29: 10 mL
  Filled 2015-04-29: qty 10

## 2015-04-29 MED ORDER — SODIUM CHLORIDE 0.9 % IV SOLN: Freq: Once | INTRAVENOUS | Status: DC

## 2015-04-29 MED ORDER — HEPARIN SOD (PORK) LOCK FLUSH 100 UNIT/ML IV SOLN
500.0000 [IU] | Freq: Once | INTRAVENOUS | Status: AC | PRN
  Administered 2015-04-29: 500 [IU]
  Filled 2015-04-29: qty 5

## 2015-04-29 NOTE — Telephone Encounter (Signed)
Per staff message and POF I have scheduled appts. Advised scheduler of appts. JMW  

## 2015-04-29 NOTE — Progress Notes (Signed)
Nutrition follow-up completed with patient in the infusion. Weight has decreased approximately 6 pounds and was documented as 160.6 pounds March 14, down from 166.4 pounds February 21. Patient has been consuming 3-4 oral nutrition supplements.  In addition to meals and snacks. No family is present at this time. Patient denies questions or concerns/Nutrition impact symptoms  Nutrition diagnosis: Unintended weight loss continues.  Intervention: Encouraged patient to continue strategies for increased oral intake minimizing further weight loss. Teach back method used.  Monitoring, evaluation, goals: Patient will tolerate increased oral nutrition supplements between meals to minimize further weight loss.  Next visit: Tuesday, April 25, during infusion.  **Disclaimer: This note was dictated with voice recognition software. Similar sounding words can inadvertently be transcribed and this note may contain transcription errors which may not have been corrected upon publication of note.**

## 2015-04-29 NOTE — Patient Instructions (Signed)
[facility] Discharge Instructions for Patients Receiving Chemotherapy  Today you received the following chemotherapy agents Cyramza/Taxotere.  To help prevent nausea and vomiting after your treatment, we encourage you to take your nausea medication as directed.   If you develop nausea and vomiting that is not controlled by your nausea medication, call the clinic.   BELOW ARE SYMPTOMS THAT SHOULD BE REPORTED IMMEDIATELY:  *FEVER GREATER THAN 100.5 F  *CHILLS WITH OR WITHOUT FEVER  NAUSEA AND VOMITING THAT IS NOT CONTROLLED WITH YOUR NAUSEA MEDICATION  *UNUSUAL SHORTNESS OF BREATH  *UNUSUAL BRUISING OR BLEEDING  TENDERNESS IN MOUTH AND THROAT WITH OR WITHOUT PRESENCE OF ULCERS  *URINARY PROBLEMS  *BOWEL PROBLEMS  UNUSUAL RASH Items with * indicate a potential emergency and should be followed up as soon as possible.  Feel free to call the clinic you have any questions or concerns. The clinic phone number is [phone].  Please show the [facility] at check-in to the Emergency Department and triage nurse.  Dehydration, Adult Dehydration is when you lose more fluids from the body than you take in. Vital organs like the kidneys, brain, and heart cannot function without a proper amount of fluids and salt. Any loss of fluids from the body can cause dehydration.  CAUSES   Vomiting.  Diarrhea.  Excessive sweating.  Excessive urine output.  Fever. SYMPTOMS  Mild dehydration  Thirst.  Dry lips.  Slightly dry mouth. Moderate dehydration  Very dry mouth.  Sunken eyes.  Skin does not bounce back quickly when lightly pinched and released.  Dark urine and decreased urine production.  Decreased tear production.  Headache. Severe dehydration  Very dry mouth.  Extreme thirst.  Rapid, weak pulse (more than 100 beats per minute at rest).  Cold hands and feet.  Not able to sweat in spite of heat and temperature.  Rapid  breathing.  Blue lips.  Confusion and lethargy.  Difficulty being awakened.  Minimal urine production.  No tears. DIAGNOSIS  Your caregiver will diagnose dehydration based on your symptoms and your exam. Blood and urine tests will help confirm the diagnosis. The diagnostic evaluation should also identify the cause of dehydration. TREATMENT  Treatment of mild or moderate dehydration can often be done at home by increasing the amount of fluids that you drink. It is best to drink small amounts of fluid more often. Drinking too much at one time can make vomiting worse. Refer to the home care instructions below. Severe dehydration needs to be treated at the hospital where you will probably be given intravenous (IV) fluids that contain water and electrolytes. HOME CARE INSTRUCTIONS   Ask your caregiver about specific rehydration instructions.  Drink enough fluids to keep your urine clear or pale yellow.  Drink small amounts frequently if you have nausea and vomiting.  Eat as you normally do.  Avoid:  Foods or drinks high in sugar.  Carbonated drinks.  Juice.  Extremely hot or cold fluids.  Drinks with caffeine.  Fatty, greasy foods.  Alcohol.  Tobacco.  Overeating.  Gelatin desserts.  Wash your hands well to avoid spreading bacteria and viruses.  Only take over-the-counter or prescription medicines for pain, discomfort, or fever as directed by your caregiver.  Ask your caregiver if you should continue all prescribed and over-the-counter medicines.  Keep all follow-up appointments with your caregiver. SEEK MEDICAL CARE IF:  You have abdominal pain and it increases or stays in one area (localizes).  You have a rash, stiff  neck, or severe headache.  You are irritable, sleepy, or difficult to awaken.  You are weak, dizzy, or extremely thirsty. SEEK IMMEDIATE MEDICAL CARE IF:   You are unable to keep fluids down or you get worse despite treatment.  You have  frequent episodes of vomiting or diarrhea.  You have blood or green matter (bile) in your vomit.  You have blood in your stool or your stool looks black and tarry.  You have not urinated in 6 to 8 hours, or you have only urinated a small amount of very dark urine.  You have a fever.  You faint. MAKE SURE YOU:   Understand these instructions.  Will watch your condition.  Will get help right away if you are not doing well or get worse. Document Released: 02/01/2005 Document Revised: 04/26/2011 Document Reviewed: 09/21/2010 [facility] Patient Information 2015 [place], [place]. This information is not intended to replace advice given to you by your health care provider. Make sure you discuss any questions you have with your health care provider.

## 2015-04-29 NOTE — Progress Notes (Signed)
[facility] [phone]   [phone]  OFFICE PROGRESS NOTE  [name], MD [address] [address]  DIAGNOSIS: Stage IV (T2a, N3, M1b) non-small cell lung cancer, squamous cell carcinoma diagnosed in April 2016.  PRIOR THERAPY:  1) Systemic chemotherapy with carboplatin for AUC of 5 given on day 1 and gemcitabine 1000 MG/M2 on days 1 and 8 every 3 weeks. Status post 6 cycles with partial response. 2) Nivolumab 240 MG IV every 2 weeks status post 4 cycles, last dose was given 03/18/2015 discontinued today secondary to disease progression.  CURRENT THERAPY: Systemic chemotherapy with docetaxel 75 MG/M2 and Cyramza 10 MG/KG every 3 weeks, first dose 04/08/2015. Status post one cycle.  CODE STATUS: Full code initially but no prolonged resuscitation   INTERVAL HISTORY: Keith Holloway 55 y.o. male returns to the clinic today for follow-up visit accompanied by his wife and daughter. Tolerated the first cycle of his systemic chemotherapy with docetaxel and Cyramza will except for nausea that lasted for 2 weeks. He took Compazine with mild improvement. The last week he has been doing very well with no concerning complaints. He still has lack of appetite and poor hydration. His cough has significantly improved since starting the new chemotherapy. He continues to have some dysphagia to solid food. He denied having any significant chest pain, shortness breath or hemoptysis. He lost few pounds since his last visit. The patient denied having any fever or chills. He has no nausea or vomiting. The patient denied having any bleeding issues. He is here today to start cycle #2 of his chemotherapy.  MEDICAL HISTORY: Past Medical History  Diagnosis Date  . Diabetes mellitus without complication ([place])   . Hypertension   . High cholesterol   . Migraine   . Depression   . Arthritis   . Alcohol abuse   . Polysubstance abuse     alcohol and cocaine  .  Polyneuropathy in diabetes(357.2)   . Memory disorder 08/22/2013  . CHF (congestive heart failure) ([place])   . Tobacco abuse counseling 10/09/2014  . Legally blind   . Cancer ([place])     Lung Cancer    ALLERGIES:  has No Known Allergies.  MEDICATIONS:  Current Outpatient Prescriptions  Medication Sig Dispense Refill  . aspirin EC 81 MG tablet Take 1 tablet by mouth daily.    . benzonatate (TESSALON) 100 MG capsule TAKE ONE CAPSULE EVERY 8 HOURS 21 capsule 0  . cholecalciferol (VITAMIN D) 1000 units tablet Take 1,000 Units by mouth daily.    Marland Kitchen dexamethasone (DECADRON) 4 MG tablet 2 tablets by mouth twice a day the day before, day of and day after the chemotherapy every 3 weeks 40 tablet 1  . Ferrous Sulfate (IRON) 325 (65 Fe) MG TABS Take 1 tablet by mouth daily.    Marland Kitchen HYDROcodone-acetaminophen (NORCO/VICODIN) 5-325 MG tablet Take 2 tablets by mouth every 6 (six) hours as needed. 60 tablet 0  . insulin NPH-regular Human (NOVOLIN 70/30) (70-30) 100 UNIT/ML injection Inject 35 Units into the skin 2 (two) times daily with a meal. 10 mL 11  . lidocaine-prilocaine (EMLA) cream Apply 1 application topically as needed. 30 g 0  . losartan-hydrochlorothiazide (HYZAAR) 100-25 MG per tablet Take 1 tablet by mouth daily.  2  . metFORMIN (GLUCOPHAGE) 1000 MG tablet Take 1 tablet (1,000 mg total) by mouth 2 (two) times daily. 60 tablet 12  . nicotine (NICODERM CQ - DOSED IN MG/24  HOURS) 14 mg/24hr patch PLACE 1 PATCH (14 MG TOTAL) ONTO THE SKIN DAILY. 28 patch 0  . prochlorperazine (COMPAZINE) 10 MG tablet TAKE 1 TABLET (10 MG TOTAL) BY MOUTH EVERY 6 (SIX) HOURS AS NEEDED FOR NAUSEA OR VOMITING. 30 tablet 0  . simvastatin (ZOCOR) 40 MG tablet Take 1 tablet (40 mg total) by mouth daily. 30 tablet 3  . temazepam (RESTORIL) 15 MG capsule Take 15 mg by mouth at bedtime.  1  . vitamin C (ASCORBIC ACID) 500 MG tablet Take 500 mg by mouth daily.     No current facility-administered medications for this visit.     SURGICAL HISTORY:  Past Surgical History  Procedure Laterality Date  . Eye surgery  04/2012    detatched retna, bilateral  . Eye surgery  08/2012  . Cataract extraction      bilaterally  . Retinol detachment Bilateral 2014    REVIEW OF SYSTEMS:  A comprehensive review of systems was negative except for: Constitutional: positive for anorexia, fatigue and weight loss Respiratory: positive for dyspnea on exertion Gastrointestinal: positive for nausea   PHYSICAL EXAMINATION: General appearance: alert, cooperative, fatigued and no distress Head: Normocephalic, without obvious abnormality, atraumatic Neck: no adenopathy, no JVD, supple, symmetrical, trachea midline and thyroid not enlarged, symmetric, no tenderness/mass/nodules Lymph nodes: Cervical, supraclavicular, and axillary nodes normal. Resp: clear to auscultation bilaterally Back: symmetric, no curvature. ROM normal. No CVA tenderness. Cardio: regular rate and rhythm, S1, S2 normal, no murmur, click, rub or gallop GI: soft, non-tender; bowel sounds normal; no masses,  no organomegaly Extremities: extremities normal, atraumatic, no cyanosis or edema Neurologic: Alert and oriented X 3, normal strength and tone. Normal symmetric reflexes. Normal coordination and gait  ECOG PERFORMANCE STATUS: 1 - Symptomatic but completely ambulatory  Blood pressure 141/78, pulse 105, temperature 97.8 F (36.6 C), temperature source Oral, resp. rate 18, height '5\' 11"'$  (1.803 m), weight 160 lb 9.6 oz (72.848 kg), SpO2 100 %.  LABORATORY DATA: Lab Results  Component Value Date   WBC 17.1* 04/29/2015   HGB 10.2* 04/29/2015   HCT 32.4* 04/29/2015   MCV 66.0* 04/29/2015   PLT 432* 04/29/2015      Chemistry      Component Value Date/Time   NA 138 04/29/2015 1026   NA 142 01/30/2015 1330   K 4.9 04/29/2015 1026   K 4.4 01/30/2015 1330   CL 106 01/30/2015 1330   CO2 25 04/29/2015 1026   CO2 27 01/30/2015 1330   BUN 25.3 04/29/2015 1026    BUN 15 01/30/2015 1330   CREATININE 2.3* 04/29/2015 1026   CREATININE 1.43* 01/30/2015 1330   CREATININE 1.02 01/23/2014 1313      Component Value Date/Time   CALCIUM 9.9 04/29/2015 1026   CALCIUM 9.7 01/30/2015 1330   ALKPHOS 166* 04/29/2015 1026   ALKPHOS 108 05/05/2014 2049   AST 14 04/29/2015 1026   AST 20 05/05/2014 2049   ALT 22 04/29/2015 1026   ALT 24 05/05/2014 2049   BILITOT 0.34 04/29/2015 1026   BILITOT 0.5 05/05/2014 2049       RADIOGRAPHIC STUDIES: Ct Abdomen Pelvis Wo Contrast  03/31/2015  CLINICAL DATA:  Non-small cell lung cancer.  Restaging. EXAM: CT CHEST, ABDOMEN AND PELVIS WITHOUT CONTRAST TECHNIQUE: Multidetector CT imaging of the chest, abdomen and pelvis was performed following the standard protocol without IV contrast. COMPARISON:  10/30/2014 FINDINGS: CT CHEST Mediastinum: Increase in size of anterior mediastinal mass which now measures 4.7 x 8.9 cm, image 26  of series 2. Previously 3.5 x 6.3 cm. New enlarged sub- carinal lymph node measures 1.7 cm, image 32 of series 2. The left lower lobe perihilar lung mass measures 5.6 cm, image 39 of series 2. Previously 1.9 cm. There is associated new postobstructive atelectasis/pneumonitis involving the left lower lobe, image 49 of series 2. Lungs/Pleura: New left pleural effusion. Innumerable pulmonary nodules are identified throughout both lungs. This demonstrates significant interval progression when compared with 10/30/2014. New perifissural lung mass within the left lung measures 2.3 cm, image 23 of series 4. Within the left upper lobe there is a new medial subpleural nodule measuring 2.7 cm, image 23 of series 4. New left lower lobe nodule measures 1 cm, image 40 of series 4. Musculoskeletal: No aggressive lytic or sclerotic bone lesions identified. CT ABDOMEN AND PELVIS Hepatobiliary: Within the lateral right lobe of liver there is a new suspicious lesion measuring 2.9 cm, image 50 of series 2. The gallbladder appears  normal. There is no biliary dilatation. Pancreas: Negative Spleen: The spleen appears normal. Adrenals/Urinary Tract: Normal adrenal glands. The kidneys are unremarkable. The urinary bladder appears normal. Stomach/Bowel: The stomach and the small bowel loops have a normal course and caliber. No out obstruction. Normal appearance of the colon. Vascular/Lymphatic: Calcified atherosclerotic disease involves the abdominal aorta. No aneurysm. No upper abdominal adenopathy identified. No enlarged pelvic or inguinal lymph nodes. Reproductive: Normal appearance of the prostate gland and seminal vesicles. Other: There is no ascites or focal fluid collections within the abdomen or pelvis. Musculoskeletal: No aggressive lytic or sclerotic bone lesions. IMPRESSION: 1. Interval progression of disease when compared with 10/30/2014 2. Increase in size of dominant anterior mediastinal mass and left lower lobe perihilar mass. There has also been development of new sub- carinal adenopathy. 3. Multiple new pulmonary nodules are identified throughout both lungs compatible with metastatic disease. 4. New low density structure within right lobe of liver is also worrisome for metastatic disease. Electronically Signed   By: Kerby Moors M.D.   On: 03/31/2015 09:38   Ct Chest Wo Contrast  03/31/2015  CLINICAL DATA:  Non-small cell lung cancer.  Restaging. EXAM: CT CHEST, ABDOMEN AND PELVIS WITHOUT CONTRAST TECHNIQUE: Multidetector CT imaging of the chest, abdomen and pelvis was performed following the standard protocol without IV contrast. COMPARISON:  10/30/2014 FINDINGS: CT CHEST Mediastinum: Increase in size of anterior mediastinal mass which now measures 4.7 x 8.9 cm, image 26 of series 2. Previously 3.5 x 6.3 cm. New enlarged sub- carinal lymph node measures 1.7 cm, image 32 of series 2. The left lower lobe perihilar lung mass measures 5.6 cm, image 39 of series 2. Previously 1.9 cm. There is associated new postobstructive  atelectasis/pneumonitis involving the left lower lobe, image 49 of series 2. Lungs/Pleura: New left pleural effusion. Innumerable pulmonary nodules are identified throughout both lungs. This demonstrates significant interval progression when compared with 10/30/2014. New perifissural lung mass within the left lung measures 2.3 cm, image 23 of series 4. Within the left upper lobe there is a new medial subpleural nodule measuring 2.7 cm, image 23 of series 4. New left lower lobe nodule measures 1 cm, image 40 of series 4. Musculoskeletal: No aggressive lytic or sclerotic bone lesions identified. CT ABDOMEN AND PELVIS Hepatobiliary: Within the lateral right lobe of liver there is a new suspicious lesion measuring 2.9 cm, image 50 of series 2. The gallbladder appears normal. There is no biliary dilatation. Pancreas: Negative Spleen: The spleen appears normal. Adrenals/Urinary Tract: Normal adrenal glands.  The kidneys are unremarkable. The urinary bladder appears normal. Stomach/Bowel: The stomach and the small bowel loops have a normal course and caliber. No out obstruction. Normal appearance of the colon. Vascular/Lymphatic: Calcified atherosclerotic disease involves the abdominal aorta. No aneurysm. No upper abdominal adenopathy identified. No enlarged pelvic or inguinal lymph nodes. Reproductive: Normal appearance of the prostate gland and seminal vesicles. Other: There is no ascites or focal fluid collections within the abdomen or pelvis. Musculoskeletal: No aggressive lytic or sclerotic bone lesions. IMPRESSION: 1. Interval progression of disease when compared with 10/30/2014 2. Increase in size of dominant anterior mediastinal mass and left lower lobe perihilar mass. There has also been development of new sub- carinal adenopathy. 3. Multiple new pulmonary nodules are identified throughout both lungs compatible with metastatic disease. 4. New low density structure within right lobe of liver is also worrisome for  metastatic disease. Electronically Signed   By: Kerby Moors M.D.   On: 03/31/2015 09:38    ASSESSMENT AND PLAN: This is a very pleasant 55 years old African-American male with a stage IV non-small cell lung cancer, squamous cell carcinoma who completed systemic chemotherapy with carboplatin and gemcitabine status post 6 cycles. The patient has tolerated his treatment fairly well with no significant adverse effects except for mild fatigue and nausea. He had evidence for disease progression and the patient was started on treatment with immunotherapy with Nivolumab currently status post 4 cycles. Unfortunately the recent CT scan of the chest, abdomen and pelvis showed interval progression of his disease with increase in the size of the dominant anterior mediastinal mass and left lower lobe perihilar mass. There was also development of new subcarinal adenopathy and multiple new pulmonary nodules as well as questionable liver lesion. He is currently undergoing systemic chemotherapy with docetaxel and Cyramza status post 1 cycle. He tolerated the first cycle well except for the persistent nausea for almost 2 weeks. He noticed a significant improvement in his cough and breathing after starting the new chemotherapy. I recommended for the patient to proceed with cycle #2 today. I will change his nausea medicine to Zofran 8 mg by mouth every 8 hours as needed for nausea. For the dehydration and renal insufficiency, I will arrange for the patient to receive 1 L of normal saline with his chemotherapy today. He would come back for follow-up visit in 3 weeks for reevaluation with the start of cycle #3 of his systemic chemotherapy.  CODE STATUS was discussed with the patient and he would like to continue with full code but no prolonged resuscitation. He was advised to call immediately if he has any concerning symptoms in the interval. The patient voices understanding of current disease status and treatment options  and is in agreement with the current care plan.  All questions were answered. The patient knows to call the clinic with any problems, questions or concerns. We can certainly see the patient much sooner if necessary.  Disclaimer: This note was dictated with voice recognition software. Similar sounding words can inadvertently be transcribed and may not be corrected upon review.

## 2015-04-29 NOTE — Telephone Encounter (Signed)
per pof to sch pt appt-sent MW email to [facility]sch trmt-pt to get updated copy b4 leaving

## 2015-04-30 ENCOUNTER — Telehealth: Payer: Self-pay | Admitting: Internal Medicine

## 2015-04-30 NOTE — Telephone Encounter (Signed)
pt cld and lwef tvoicemail needing confirned appt-cld pt back and spoke to spouse and advised that appt was 3/16 @ 9-sent email to Ernestene Kiel for patient-stated she has been trying to reach her. Adv spouse I sent email in hopes she will return the cal

## 2015-05-01 ENCOUNTER — Encounter: Payer: Self-pay | Admitting: Nutrition

## 2015-05-01 ENCOUNTER — Ambulatory Visit (HOSPITAL_BASED_OUTPATIENT_CLINIC_OR_DEPARTMENT_OTHER): Payer: Medicare Other

## 2015-05-01 ENCOUNTER — Ambulatory Visit: Payer: Medicare Other

## 2015-05-01 VITALS — BP 128/83 | HR 69 | Temp 98.3°F

## 2015-05-01 DIAGNOSIS — C342 Malignant neoplasm of middle lobe, bronchus or lung: Secondary | ICD-10-CM | POA: Diagnosis not present

## 2015-05-01 DIAGNOSIS — C3492 Malignant neoplasm of unspecified part of left bronchus or lung: Secondary | ICD-10-CM

## 2015-05-01 MED ORDER — PEGFILGRASTIM INJECTION 6 MG/0.6ML ~~LOC~~
6.0000 mg | PREFILLED_SYRINGE | Freq: Once | SUBCUTANEOUS | Status: AC
  Administered 2015-05-01: 6 mg via SUBCUTANEOUS
  Filled 2015-05-01: qty 0.6

## 2015-05-01 NOTE — Progress Notes (Signed)
Patient provided with third and final case of Ensure Plus.

## 2015-05-01 NOTE — Patient Instructions (Signed)
Pegfilgrastim injection What is this medicine? PEGFILGRASTIM (PEG fil gra stim) is a long-acting granulocyte colony-stimulating factor that stimulates the growth of neutrophils, a type of white blood cell important in the body's fight against infection. It is used to reduce the incidence of fever and infection in patients with certain types of cancer who are receiving chemotherapy that affects the bone marrow, and to increase survival after being exposed to high doses of radiation. This medicine may be used for other purposes; ask your health care provider or pharmacist if you have questions. What should I tell my health care provider before I take this medicine? They need to know if you have any of these conditions: -kidney disease -latex allergy -ongoing radiation therapy -sickle cell disease -skin reactions to acrylic adhesives (On-Body Injector only) -an unusual or allergic reaction to pegfilgrastim, filgrastim, other medicines, foods, dyes, or preservatives -pregnant or trying to get pregnant -breast-feeding How should I use this medicine? This medicine is for injection under the skin. If you get this medicine at home, you will be taught how to prepare and give the pre-filled syringe or how to use the On-body Injector. Refer to the patient Instructions for Use for detailed instructions. Use exactly as directed. Take your medicine at regular intervals. Do not take your medicine more often than directed. It is important that you put your used needles and syringes in a special sharps container. Do not put them in a trash can. If you do not have a sharps container, call your pharmacist or healthcare provider to get one. Talk to your pediatrician regarding the use of this medicine in children. While this drug may be prescribed for selected conditions, precautions do apply. Overdosage: If you think you have taken too much of this medicine contact a poison control center or emergency room at  once. NOTE: This medicine is only for you. Do not share this medicine with others. What if I miss a dose? It is important not to miss your dose. Call your doctor or health care professional if you miss your dose. If you miss a dose due to an On-body Injector failure or leakage, a new dose should be administered as soon as possible using a single prefilled syringe for manual use. What may interact with this medicine? Interactions have not been studied. Give your health care provider a list of all the medicines, herbs, non-prescription drugs, or dietary supplements you use. Also tell them if you smoke, drink alcohol, or use illegal drugs. Some items may interact with your medicine. This list may not describe all possible interactions. Give your health care provider a list of all the medicines, herbs, non-prescription drugs, or dietary supplements you use. Also tell them if you smoke, drink alcohol, or use illegal drugs. Some items may interact with your medicine. What should I watch for while using this medicine? You may need blood work done while you are taking this medicine. If you are going to need a MRI, CT scan, or other procedure, tell your doctor that you are using this medicine (On-Body Injector only). What side effects may I notice from receiving this medicine? Side effects that you should report to your doctor or health care professional as soon as possible: -allergic reactions like skin rash, itching or hives, swelling of the face, lips, or tongue -dizziness -fever -pain, redness, or irritation at site where injected -pinpoint red spots on the skin -red or dark-brown urine -shortness of breath or breathing problems -stomach or side pain, or pain   at the shoulder -swelling -tiredness -trouble passing urine or change in the amount of urine Side effects that usually do not require medical attention (report to your doctor or health care professional if they continue or are  bothersome): -bone pain -muscle pain This list may not describe all possible side effects. Call your doctor for medical advice about side effects. You may report side effects to FDA at 1-800-FDA-1088. Where should I keep my medicine? Keep out of the reach of children. Store pre-filled syringes in a refrigerator between 2 and 8 degrees C (36 and 46 degrees F). Do not freeze. Keep in carton to protect from light. Throw away this medicine if it is left out of the refrigerator for more than 48 hours. Throw away any unused medicine after the expiration date. NOTE: This sheet is a summary. It may not cover all possible information. If you have questions about this medicine, talk to your doctor, pharmacist, or health care provider.    2016, [employer] Standard. (2014-02-21 14:30:14)  

## 2015-05-06 ENCOUNTER — Other Ambulatory Visit (HOSPITAL_BASED_OUTPATIENT_CLINIC_OR_DEPARTMENT_OTHER): Payer: Medicare Other

## 2015-05-06 DIAGNOSIS — C3432 Malignant neoplasm of lower lobe, left bronchus or lung: Secondary | ICD-10-CM | POA: Diagnosis present

## 2015-05-06 DIAGNOSIS — C3492 Malignant neoplasm of unspecified part of left bronchus or lung: Secondary | ICD-10-CM

## 2015-05-06 LAB — COMPREHENSIVE METABOLIC PANEL
ALT: 25 U/L (ref 0–55)
ANION GAP: 7 meq/L (ref 3–11)
AST: 18 U/L (ref 5–34)
Albumin: 3.3 g/dL — ABNORMAL LOW (ref 3.5–5.0)
Alkaline Phosphatase: 144 U/L (ref 40–150)
BUN: 18.4 mg/dL (ref 7.0–26.0)
CALCIUM: 10 mg/dL (ref 8.4–10.4)
CHLORIDE: 104 meq/L (ref 98–109)
CO2: 29 meq/L (ref 22–29)
CREATININE: 1.2 mg/dL (ref 0.7–1.3)
EGFR: 82 mL/min/{1.73_m2} — ABNORMAL LOW (ref >90)
Glucose: 156 mg/dl — ABNORMAL HIGH (ref 70–140)
Potassium: 4.9 mEq/L (ref 3.5–5.1)
Sodium: 140 mEq/L (ref 136–145)
TOTAL PROTEIN: 7.6 g/dL (ref 6.4–8.3)
Total Bilirubin: 0.41 mg/dL (ref 0.20–1.20)

## 2015-05-06 LAB — CBC WITH DIFFERENTIAL/PLATELET
BASO%: 0.4 % (ref 0.0–2.0)
Basophils Absolute: 0 10*3/uL (ref 0.0–0.1)
EOS%: 3.2 % (ref 0.0–7.0)
Eosinophils Absolute: 0.2 10*3/uL (ref 0.0–0.5)
HEMATOCRIT: 30.7 % — AB (ref 38.4–49.9)
HEMOGLOBIN: 9.9 g/dL — AB (ref 13.0–17.1)
LYMPH#: 1 10*3/uL (ref 0.9–3.3)
LYMPH%: 16.9 % (ref 14.0–49.0)
MCH: 21.1 pg — ABNORMAL LOW (ref 27.2–33.4)
MCHC: 32.2 g/dL (ref 32.0–36.0)
MCV: 65.6 fL — ABNORMAL LOW (ref 79.3–98.0)
MONO#: 1.4 10*3/uL — AB (ref 0.1–0.9)
MONO%: 22.6 % — ABNORMAL HIGH (ref 0.0–14.0)
NEUT%: 56.9 % (ref 39.0–75.0)
NEUTROS ABS: 3.5 10*3/uL (ref 1.5–6.5)
PLATELETS: 187 10*3/uL (ref 140–400)
RBC: 4.67 10*6/uL (ref 4.20–5.82)
RDW: 15.3 % — AB (ref 11.0–14.6)
WBC: 6.2 10*3/uL (ref 4.0–10.3)

## 2015-05-13 ENCOUNTER — Other Ambulatory Visit: Payer: Self-pay | Admitting: Medical Oncology

## 2015-05-13 ENCOUNTER — Other Ambulatory Visit: Payer: Medicare Other

## 2015-05-13 ENCOUNTER — Ambulatory Visit: Payer: Medicare Other | Admitting: Internal Medicine

## 2015-05-13 DIAGNOSIS — C3492 Malignant neoplasm of unspecified part of left bronchus or lung: Secondary | ICD-10-CM

## 2015-05-13 MED ORDER — HYDROCODONE-ACETAMINOPHEN 5-325 MG PO TABS: 2.0000 | ORAL_TABLET | Freq: Four times a day (QID) | ORAL | Status: DC | PRN

## 2015-05-20 ENCOUNTER — Telehealth: Payer: Self-pay | Admitting: Internal Medicine

## 2015-05-20 ENCOUNTER — Encounter: Payer: Self-pay | Admitting: Internal Medicine

## 2015-05-20 ENCOUNTER — Ambulatory Visit (HOSPITAL_BASED_OUTPATIENT_CLINIC_OR_DEPARTMENT_OTHER): Payer: Medicare Other

## 2015-05-20 ENCOUNTER — Ambulatory Visit (HOSPITAL_BASED_OUTPATIENT_CLINIC_OR_DEPARTMENT_OTHER): Payer: Medicare Other | Admitting: Internal Medicine

## 2015-05-20 ENCOUNTER — Other Ambulatory Visit (HOSPITAL_BASED_OUTPATIENT_CLINIC_OR_DEPARTMENT_OTHER): Payer: Medicare Other

## 2015-05-20 VITALS — BP 122/68 | HR 75 | Temp 98.2°F | Resp 18 | Ht 71.0 in | Wt 167.7 lb

## 2015-05-20 DIAGNOSIS — C3491 Malignant neoplasm of unspecified part of right bronchus or lung: Secondary | ICD-10-CM

## 2015-05-20 DIAGNOSIS — C3432 Malignant neoplasm of lower lobe, left bronchus or lung: Secondary | ICD-10-CM

## 2015-05-20 DIAGNOSIS — Z79899 Other long term (current) drug therapy: Secondary | ICD-10-CM | POA: Diagnosis not present

## 2015-05-20 DIAGNOSIS — Z5111 Encounter for antineoplastic chemotherapy: Secondary | ICD-10-CM

## 2015-05-20 DIAGNOSIS — C3492 Malignant neoplasm of unspecified part of left bronchus or lung: Secondary | ICD-10-CM

## 2015-05-20 DIAGNOSIS — G4700 Insomnia, unspecified: Secondary | ICD-10-CM | POA: Diagnosis not present

## 2015-05-20 DIAGNOSIS — Z5112 Encounter for antineoplastic immunotherapy: Secondary | ICD-10-CM

## 2015-05-20 LAB — CBC WITH DIFFERENTIAL/PLATELET
BASO%: 0.1 % (ref 0.0–2.0)
Basophils Absolute: 0 10*3/uL (ref 0.0–0.1)
EOS ABS: 0 10*3/uL (ref 0.0–0.5)
EOS%: 0 % (ref 0.0–7.0)
HCT: 30.9 % — ABNORMAL LOW (ref 38.4–49.9)
HEMOGLOBIN: 9.5 g/dL — AB (ref 13.0–17.1)
LYMPH#: 1.1 10*3/uL (ref 0.9–3.3)
LYMPH%: 3.4 % — ABNORMAL LOW (ref 14.0–49.0)
MCH: 21 pg — ABNORMAL LOW (ref 27.2–33.4)
MCHC: 30.9 g/dL — ABNORMAL LOW (ref 32.0–36.0)
MCV: 68.1 fL — AB (ref 79.3–98.0)
MONO#: 1.2 10*3/uL — AB (ref 0.1–0.9)
MONO%: 4 % (ref 0.0–14.0)
NEUT%: 92.5 % — ABNORMAL HIGH (ref 39.0–75.0)
NEUTROS ABS: 28.4 10*3/uL — AB (ref 1.5–6.5)
PLATELETS: 285 10*3/uL (ref 140–400)
RBC: 4.53 10*6/uL (ref 4.20–5.82)
RDW: 17.7 % — AB (ref 11.0–14.6)
WBC: 30.7 10*3/uL — AB (ref 4.0–10.3)

## 2015-05-20 LAB — COMPREHENSIVE METABOLIC PANEL
ALBUMIN: 3.4 g/dL — AB (ref 3.5–5.0)
ALK PHOS: 118 U/L (ref 40–150)
ALT: 11 U/L (ref 0–55)
ANION GAP: 8 meq/L (ref 3–11)
AST: 12 U/L (ref 5–34)
BUN: 21.8 mg/dL (ref 7.0–26.0)
CO2: 25 mEq/L (ref 22–29)
CREATININE: 1.4 mg/dL — AB (ref 0.7–1.3)
Calcium: 9.6 mg/dL (ref 8.4–10.4)
Chloride: 106 mEq/L (ref 98–109)
EGFR: 66 mL/min/{1.73_m2} — AB (ref >90)
Glucose: 297 mg/dl — ABNORMAL HIGH (ref 70–140)
Potassium: 4.9 mEq/L (ref 3.5–5.1)
SODIUM: 139 meq/L (ref 136–145)
TOTAL PROTEIN: 7.5 g/dL (ref 6.4–8.3)

## 2015-05-20 LAB — UA PROTEIN, DIPSTICK - CHCC: Protein, ur: NEGATIVE mg/dL

## 2015-05-20 MED ORDER — ACETAMINOPHEN 325 MG PO TABS
650.0000 mg | ORAL_TABLET | Freq: Once | ORAL | Status: AC
  Administered 2015-05-20: 650 mg via ORAL

## 2015-05-20 MED ORDER — SODIUM CHLORIDE 0.9 % IV SOLN
Freq: Once | INTRAVENOUS | Status: AC
  Administered 2015-05-20: 12:00:00 via INTRAVENOUS

## 2015-05-20 MED ORDER — DEXAMETHASONE SODIUM PHOSPHATE 100 MG/10ML IJ SOLN
10.0000 mg | Freq: Once | INTRAMUSCULAR | Status: AC
  Administered 2015-05-20: 10 mg via INTRAVENOUS
  Filled 2015-05-20: qty 1

## 2015-05-20 MED ORDER — SODIUM CHLORIDE 0.9 % IV SOLN
10.0000 mg/kg | Freq: Once | INTRAVENOUS | Status: AC
  Administered 2015-05-20: 800 mg via INTRAVENOUS
  Filled 2015-05-20: qty 50

## 2015-05-20 MED ORDER — DIPHENHYDRAMINE HCL 50 MG/ML IJ SOLN
50.0000 mg | Freq: Once | INTRAMUSCULAR | Status: AC
  Administered 2015-05-20: 50 mg via INTRAVENOUS

## 2015-05-20 MED ORDER — SODIUM CHLORIDE 0.9 % IV SOLN
75.0000 mg/m2 | Freq: Once | INTRAVENOUS | Status: AC
  Administered 2015-05-20: 150 mg via INTRAVENOUS
  Filled 2015-05-20: qty 15

## 2015-05-20 MED ORDER — TEMAZEPAM 30 MG PO CAPS: 30.0000 mg | ORAL_CAPSULE | Freq: Every day | ORAL | Status: DC

## 2015-05-20 MED ORDER — HEPARIN SOD (PORK) LOCK FLUSH 100 UNIT/ML IV SOLN
500.0000 [IU] | Freq: Once | INTRAVENOUS | Status: AC | PRN
  Administered 2015-05-20: 500 [IU]
  Filled 2015-05-20: qty 5

## 2015-05-20 MED ORDER — SODIUM CHLORIDE 0.9% FLUSH
10.0000 mL | INTRAVENOUS | Status: DC | PRN
  Administered 2015-05-20: 10 mL
  Filled 2015-05-20: qty 10

## 2015-05-20 MED ORDER — ACETAMINOPHEN 325 MG PO TABS
ORAL_TABLET | ORAL | Status: AC
  Filled 2015-05-20: qty 2

## 2015-05-20 MED ORDER — DIPHENHYDRAMINE HCL 50 MG/ML IJ SOLN
INTRAMUSCULAR | Status: AC
  Filled 2015-05-20: qty 1

## 2015-05-20 NOTE — Telephone Encounter (Signed)
Gave and printed appt sched and avs for pt for April and may

## 2015-05-20 NOTE — Progress Notes (Signed)
[place] [phone]   [phone]  OFFICE PROGRESS NOTE  [name], MD [address] [address]  DIAGNOSIS: Stage IV (T2a, N3, M1b) non-small cell lung cancer, squamous cell carcinoma diagnosed in April 2016.  PRIOR THERAPY:  1) Systemic chemotherapy with carboplatin for AUC of 5 given on day 1 and gemcitabine 1000 MG/M2 on days 1 and 8 every 3 weeks. Status post 6 cycles with partial response. 2) Nivolumab 240 MG IV every 2 weeks status post 4 cycles, last dose was given 03/18/2015 discontinued today secondary to disease progression.  CURRENT THERAPY: Systemic chemotherapy with docetaxel 75 MG/M2 and Cyramza 10 MG/KG every 3 weeks, first dose 04/08/2015. Status post 2 cycles.  CODE STATUS: Full code initially but no prolonged resuscitation   INTERVAL HISTORY: Keith Holloway 55 y.o. male returns to the clinic today for follow-up visit accompanied by his wife. The patient is feeling fine today was no specific complaints. He gained 7 pounds since his last visit. His only concern is a stable insomnia and he would like the dose of Restoril to be increased. He tolerated the second cycle of his systemic chemotherapy with docetaxel and Cyramza fairly well. He denied having any significant chest pain, shortness breath or hemoptysis. The patient denied having any fever or chills. He has no nausea or vomiting. The patient denied having any bleeding issues. He is here today to start cycle #3 of his chemotherapy.  MEDICAL HISTORY: Past Medical History  Diagnosis Date  . Diabetes mellitus without complication ([place])   . Hypertension   . High cholesterol   . Migraine   . Depression   . Arthritis   . Alcohol abuse   . Polysubstance abuse     alcohol and cocaine  . Polyneuropathy in diabetes(357.2)   . Memory disorder 08/22/2013  . CHF (congestive heart failure) ([place])   . Tobacco abuse counseling 10/09/2014  . Legally blind   . Cancer  ([place])     Lung Cancer    ALLERGIES:  has No Known Allergies.  MEDICATIONS:  Current Outpatient Prescriptions  Medication Sig Dispense Refill  . aspirin EC 81 MG tablet Take 1 tablet by mouth daily.    . benzonatate (TESSALON) 100 MG capsule TAKE ONE CAPSULE EVERY 8 HOURS 21 capsule 0  . cholecalciferol (VITAMIN D) 1000 units tablet Take 1,000 Units by mouth daily.    Marland Kitchen dexamethasone (DECADRON) 4 MG tablet 2 tablets by mouth twice a day the day before, day of and day after the chemotherapy every 3 weeks 40 tablet 1  . Ferrous Sulfate (IRON) 325 (65 Fe) MG TABS Take 1 tablet by mouth daily.    Marland Kitchen HYDROcodone-acetaminophen (NORCO/VICODIN) 5-325 MG tablet Take 2 tablets by mouth every 6 (six) hours as needed. 60 tablet 0  . insulin NPH-regular Human (NOVOLIN 70/30) (70-30) 100 UNIT/ML injection Inject 35 Units into the skin 2 (two) times daily with a meal. 10 mL 11  . lidocaine-prilocaine (EMLA) cream Apply 1 application topically as needed. 30 g 0  . losartan-hydrochlorothiazide (HYZAAR) 100-25 MG per tablet Take 1 tablet by mouth daily.  2  . metFORMIN (GLUCOPHAGE) 1000 MG tablet Take 1 tablet (1,000 mg total) by mouth 2 (two) times daily. 60 tablet 12  . nicotine (NICODERM CQ - DOSED IN MG/24 HOURS) 14 mg/24hr patch PLACE 1 PATCH (14 MG TOTAL) ONTO THE SKIN DAILY. 28 patch 0  . ondansetron (ZOFRAN) 8 MG tablet Take 1 tablet (  8 mg total) by mouth every 8 (eight) hours as needed for nausea or vomiting. 20 tablet 0  . prochlorperazine (COMPAZINE) 10 MG tablet TAKE 1 TABLET (10 MG TOTAL) BY MOUTH EVERY 6 (SIX) HOURS AS NEEDED FOR NAUSEA OR VOMITING. 30 tablet 0  . simvastatin (ZOCOR) 40 MG tablet Take 1 tablet (40 mg total) by mouth daily. 30 tablet 3  . temazepam (RESTORIL) 15 MG capsule Take 15 mg by mouth at bedtime.  1  . vitamin C (ASCORBIC ACID) 500 MG tablet Take 500 mg by mouth daily.     No current facility-administered medications for this visit.    SURGICAL HISTORY:  Past Surgical  History  Procedure Laterality Date  . Eye surgery  04/2012    detatched retna, bilateral  . Eye surgery  08/2012  . Cataract extraction      bilaterally  . Retinol detachment Bilateral 2014    REVIEW OF SYSTEMS:  A comprehensive review of systems was negative except for: Constitutional: positive for fatigue Respiratory: positive for dyspnea on exertion   PHYSICAL EXAMINATION: General appearance: alert, cooperative, fatigued and no distress Head: Normocephalic, without obvious abnormality, atraumatic Neck: no adenopathy, no JVD, supple, symmetrical, trachea midline and thyroid not enlarged, symmetric, no tenderness/mass/nodules Lymph nodes: Cervical, supraclavicular, and axillary nodes normal. Resp: clear to auscultation bilaterally Back: symmetric, no curvature. ROM normal. No CVA tenderness. Cardio: regular rate and rhythm, S1, S2 normal, no murmur, click, rub or gallop GI: soft, non-tender; bowel sounds normal; no masses,  no organomegaly Extremities: extremities normal, atraumatic, no cyanosis or edema Neurologic: Alert and oriented X 3, normal strength and tone. Normal symmetric reflexes. Normal coordination and gait  ECOG PERFORMANCE STATUS: 1 - Symptomatic but completely ambulatory  Blood pressure 122/68, pulse 75, temperature 98.2 F (36.8 C), temperature source Oral, resp. rate 18, height '5\' 11"'$  (1.803 m), weight 167 lb 11.2 oz (76.068 kg), SpO2 100 %.  LABORATORY DATA: Lab Results  Component Value Date   WBC 30.7* 05/20/2015   HGB 9.5* 05/20/2015   HCT 30.9* 05/20/2015   MCV 68.1* 05/20/2015   PLT 285 05/20/2015      Chemistry      Component Value Date/Time   NA 140 05/06/2015 0907   NA 142 01/30/2015 1330   K 4.9 05/06/2015 0907   K 4.4 01/30/2015 1330   CL 106 01/30/2015 1330   CO2 29 05/06/2015 0907   CO2 27 01/30/2015 1330   BUN 18.4 05/06/2015 0907   BUN 15 01/30/2015 1330   CREATININE 1.2 05/06/2015 0907   CREATININE 1.43* 01/30/2015 1330    CREATININE 1.02 01/23/2014 1313      Component Value Date/Time   CALCIUM 10.0 05/06/2015 0907   CALCIUM 9.7 01/30/2015 1330   ALKPHOS 144 05/06/2015 0907   ALKPHOS 108 05/05/2014 2049   AST 18 05/06/2015 0907   AST 20 05/05/2014 2049   ALT 25 05/06/2015 0907   ALT 24 05/05/2014 2049   BILITOT 0.41 05/06/2015 0907   BILITOT 0.5 05/05/2014 2049       RADIOGRAPHIC STUDIES: No results found.  ASSESSMENT AND PLAN: This is a very pleasant 55 years old African-American male with a stage IV non-small cell lung cancer, squamous cell carcinoma who completed systemic chemotherapy with carboplatin and gemcitabine status post 6 cycles. The patient has tolerated his treatment fairly well with no significant adverse effects except for mild fatigue and nausea. He had evidence for disease progression and the patient was started on treatment with immunotherapy  with Nivolumab currently status post 4 cycles. Unfortunately the recent CT scan of the chest, abdomen and pelvis showed interval progression of his disease with increase in the size of the dominant anterior mediastinal mass and left lower lobe perihilar mass. There was also development of new subcarinal adenopathy and multiple new pulmonary nodules as well as questionable liver lesion. He is currently undergoing systemic chemotherapy with docetaxel and Cyramza status post 2 cycles. He Is tolerating his treatment fairly well. I recommended for the patient to proceed with cycle #3 today.  For insomnia, I increased the dose of Restoril to 30 mg by mouth daily at bedtime when necessary. He would come back for follow-up visit in 3 weeks for reevaluation with repeat CT scan of the chest, abdomen and pelvis for restaging of his disease before starting cycle #4. CODE STATUS was discussed with the patient and he would like to continue with full code but no prolonged resuscitation. He was advised to call immediately if he has any concerning symptoms in the  interval. The patient voices understanding of current disease status and treatment options and is in agreement with the current care plan.  All questions were answered. The patient knows to call the clinic with any problems, questions or concerns. We can certainly see the patient much sooner if necessary.  Disclaimer: This note was dictated with voice recognition software. Similar sounding words can inadvertently be transcribed and may not be corrected upon review.

## 2015-05-20 NOTE — Patient Instructions (Signed)
[facility] Discharge Instructions for Patients Receiving Chemotherapy  Today you received the following chemotherapy agents Cyramza and Taxotere To help prevent nausea and vomiting after your treatment, we encourage you to take your nausea medication as directed   If you develop nausea and vomiting that is not controlled by your nausea medication, call the clinic.   BELOW ARE SYMPTOMS THAT SHOULD BE REPORTED IMMEDIATELY:  *FEVER GREATER THAN 100.5 F  *CHILLS WITH OR WITHOUT FEVER  NAUSEA AND VOMITING THAT IS NOT CONTROLLED WITH YOUR NAUSEA MEDICATION  *UNUSUAL SHORTNESS OF BREATH  *UNUSUAL BRUISING OR BLEEDING  TENDERNESS IN MOUTH AND THROAT WITH OR WITHOUT PRESENCE OF ULCERS  *URINARY PROBLEMS  *BOWEL PROBLEMS  UNUSUAL RASH Items with * indicate a potential emergency and should be followed up as soon as possible.  Feel free to call the clinic you have any questions or concerns. The clinic phone number is [phone].  Please show the CHEMO ALERT CARD at check-in to the Emergency Department and triage nurse.   

## 2015-05-22 ENCOUNTER — Ambulatory Visit (HOSPITAL_BASED_OUTPATIENT_CLINIC_OR_DEPARTMENT_OTHER): Payer: Medicare Other

## 2015-05-22 VITALS — BP 120/60 | HR 90 | Temp 98.1°F

## 2015-05-22 DIAGNOSIS — C3432 Malignant neoplasm of lower lobe, left bronchus or lung: Secondary | ICD-10-CM | POA: Diagnosis present

## 2015-05-22 DIAGNOSIS — C3492 Malignant neoplasm of unspecified part of left bronchus or lung: Secondary | ICD-10-CM

## 2015-05-22 MED ORDER — PEGFILGRASTIM INJECTION 6 MG/0.6ML ~~LOC~~
6.0000 mg | PREFILLED_SYRINGE | Freq: Once | SUBCUTANEOUS | Status: AC
  Administered 2015-05-22: 6 mg via SUBCUTANEOUS
  Filled 2015-05-22: qty 0.6

## 2015-05-27 ENCOUNTER — Ambulatory Visit: Payer: Medicare Other | Admitting: Internal Medicine

## 2015-05-27 ENCOUNTER — Other Ambulatory Visit: Payer: Medicare Other

## 2015-06-09 ENCOUNTER — Ambulatory Visit (HOSPITAL_COMMUNITY)
Admission: RE | Admit: 2015-06-09 | Discharge: 2015-06-09 | Disposition: A | Discharge status: Home / Self Care | Payer: Medicare Other | Source: Ambulatory Visit | Attending: Internal Medicine | Admitting: Internal Medicine

## 2015-06-09 ENCOUNTER — Other Ambulatory Visit: Payer: Self-pay | Admitting: Internal Medicine

## 2015-06-09 DIAGNOSIS — R222 Localized swelling, mass and lump, trunk: Secondary | ICD-10-CM | POA: Diagnosis not present

## 2015-06-09 DIAGNOSIS — G4700 Insomnia, unspecified: Secondary | ICD-10-CM

## 2015-06-09 DIAGNOSIS — Z9221 Personal history of antineoplastic chemotherapy: Secondary | ICD-10-CM | POA: Insufficient documentation

## 2015-06-09 DIAGNOSIS — C7802 Secondary malignant neoplasm of left lung: Secondary | ICD-10-CM | POA: Insufficient documentation

## 2015-06-09 DIAGNOSIS — R935 Abnormal findings on diagnostic imaging of other abdominal regions, including retroperitoneum: Secondary | ICD-10-CM | POA: Insufficient documentation

## 2015-06-09 DIAGNOSIS — C3491 Malignant neoplasm of unspecified part of right bronchus or lung: Secondary | ICD-10-CM | POA: Diagnosis present

## 2015-06-09 DIAGNOSIS — C7801 Secondary malignant neoplasm of right lung: Secondary | ICD-10-CM | POA: Insufficient documentation

## 2015-06-09 DIAGNOSIS — J90 Pleural effusion, not elsewhere classified: Secondary | ICD-10-CM | POA: Diagnosis not present

## 2015-06-09 DIAGNOSIS — C3492 Malignant neoplasm of unspecified part of left bronchus or lung: Secondary | ICD-10-CM

## 2015-06-09 DIAGNOSIS — Z5111 Encounter for antineoplastic chemotherapy: Secondary | ICD-10-CM

## 2015-06-09 MED ORDER — DEXAMETHASONE 4 MG PO TABS: ORAL_TABLET | ORAL | Status: DC

## 2015-06-09 MED ORDER — IOPAMIDOL (ISOVUE-300) INJECTION 61%
100.0000 mL | Freq: Once | INTRAVENOUS | Status: AC | PRN
  Administered 2015-06-09: 100 mL via INTRAVENOUS

## 2015-06-09 MED ORDER — HYDROCODONE-ACETAMINOPHEN 5-325 MG PO TABS: 2.0000 | ORAL_TABLET | Freq: Four times a day (QID) | ORAL | Status: DC | PRN

## 2015-06-09 NOTE — Telephone Encounter (Signed)
Walk in form received requesting Hydrocodone-acetaminophen 5-325 mg, ondansetron 8 mg and dexamethasone 4 mg refills.  Patient here for CT scans at [facility].

## 2015-06-10 ENCOUNTER — Telehealth: Payer: Self-pay | Admitting: Internal Medicine

## 2015-06-10 ENCOUNTER — Other Ambulatory Visit (HOSPITAL_BASED_OUTPATIENT_CLINIC_OR_DEPARTMENT_OTHER): Payer: Medicare Other

## 2015-06-10 ENCOUNTER — Ambulatory Visit: Payer: Medicare Other | Admitting: Nutrition

## 2015-06-10 ENCOUNTER — Encounter: Payer: Self-pay | Admitting: Internal Medicine

## 2015-06-10 ENCOUNTER — Ambulatory Visit (HOSPITAL_BASED_OUTPATIENT_CLINIC_OR_DEPARTMENT_OTHER): Payer: Medicare Other | Admitting: Internal Medicine

## 2015-06-10 ENCOUNTER — Ambulatory Visit (HOSPITAL_BASED_OUTPATIENT_CLINIC_OR_DEPARTMENT_OTHER): Payer: Medicare Other

## 2015-06-10 VITALS — BP 134/69 | HR 101 | Temp 97.7°F | Resp 17 | Ht 71.0 in | Wt 175.2 lb

## 2015-06-10 DIAGNOSIS — Z5112 Encounter for antineoplastic immunotherapy: Secondary | ICD-10-CM | POA: Diagnosis present

## 2015-06-10 DIAGNOSIS — C3432 Malignant neoplasm of lower lobe, left bronchus or lung: Secondary | ICD-10-CM

## 2015-06-10 DIAGNOSIS — L02222 Furuncle of back [any part, except buttock]: Secondary | ICD-10-CM | POA: Diagnosis not present

## 2015-06-10 DIAGNOSIS — L089 Local infection of the skin and subcutaneous tissue, unspecified: Secondary | ICD-10-CM | POA: Insufficient documentation

## 2015-06-10 DIAGNOSIS — Z5111 Encounter for antineoplastic chemotherapy: Secondary | ICD-10-CM

## 2015-06-10 DIAGNOSIS — C3492 Malignant neoplasm of unspecified part of left bronchus or lung: Secondary | ICD-10-CM

## 2015-06-10 DIAGNOSIS — G4700 Insomnia, unspecified: Secondary | ICD-10-CM | POA: Diagnosis not present

## 2015-06-10 DIAGNOSIS — R911 Solitary pulmonary nodule: Secondary | ICD-10-CM

## 2015-06-10 DIAGNOSIS — C3491 Malignant neoplasm of unspecified part of right bronchus or lung: Secondary | ICD-10-CM

## 2015-06-10 LAB — CBC WITH DIFFERENTIAL/PLATELET
BASO%: 0.1 % (ref 0.0–2.0)
Basophils Absolute: 0 10*3/uL (ref 0.0–0.1)
EOS ABS: 0 10*3/uL (ref 0.0–0.5)
EOS%: 0 % (ref 0.0–7.0)
HEMATOCRIT: 33.7 % — AB (ref 38.4–49.9)
HGB: 10.3 g/dL — ABNORMAL LOW (ref 13.0–17.1)
LYMPH%: 3.7 % — ABNORMAL LOW (ref 14.0–49.0)
MCH: 21.3 pg — ABNORMAL LOW (ref 27.2–33.4)
MCHC: 30.4 g/dL — ABNORMAL LOW (ref 32.0–36.0)
MCV: 70.2 fL — AB (ref 79.3–98.0)
MONO#: 0.3 10*3/uL (ref 0.1–0.9)
MONO%: 1.3 % (ref 0.0–14.0)
NEUT%: 94.9 % — ABNORMAL HIGH (ref 39.0–75.0)
NEUTROS ABS: 20.9 10*3/uL — AB (ref 1.5–6.5)
PLATELETS: 250 10*3/uL (ref 140–400)
RBC: 4.81 10*6/uL (ref 4.20–5.82)
RDW: 20.2 % — AB (ref 11.0–14.6)
WBC: 22.1 10*3/uL — AB (ref 4.0–10.3)
lymph#: 0.8 10*3/uL — ABNORMAL LOW (ref 0.9–3.3)

## 2015-06-10 LAB — COMPREHENSIVE METABOLIC PANEL
ALBUMIN: 3.5 g/dL (ref 3.5–5.0)
ALK PHOS: 119 U/L (ref 40–150)
ALT: 11 U/L (ref 0–55)
ANION GAP: 10 meq/L (ref 3–11)
AST: 13 U/L (ref 5–34)
BUN: 18.2 mg/dL (ref 7.0–26.0)
CO2: 22 mEq/L (ref 22–29)
Calcium: 9.3 mg/dL (ref 8.4–10.4)
Chloride: 105 mEq/L (ref 98–109)
Creatinine: 1.6 mg/dL — ABNORMAL HIGH (ref 0.7–1.3)
EGFR: 55 mL/min/{1.73_m2} — AB (ref >90)
GLUCOSE: 327 mg/dL — AB (ref 70–140)
Potassium: 5 mEq/L (ref 3.5–5.1)
SODIUM: 137 meq/L (ref 136–145)
TOTAL PROTEIN: 7.4 g/dL (ref 6.4–8.3)

## 2015-06-10 LAB — UA PROTEIN, DIPSTICK - CHCC: Protein, ur: NEGATIVE mg/dL

## 2015-06-10 MED ORDER — DIPHENHYDRAMINE HCL 50 MG/ML IJ SOLN
50.0000 mg | Freq: Once | INTRAMUSCULAR | Status: AC
  Administered 2015-06-10: 50 mg via INTRAVENOUS

## 2015-06-10 MED ORDER — SODIUM CHLORIDE 0.9 % IV SOLN
10.0000 mg | Freq: Once | INTRAVENOUS | Status: AC
  Administered 2015-06-10: 10 mg via INTRAVENOUS
  Filled 2015-06-10: qty 1

## 2015-06-10 MED ORDER — CEPHALEXIN 500 MG PO CAPS: 500.0000 mg | ORAL_CAPSULE | Freq: Four times a day (QID) | ORAL | Status: DC

## 2015-06-10 MED ORDER — DIPHENHYDRAMINE HCL 50 MG/ML IJ SOLN
INTRAMUSCULAR | Status: AC
  Filled 2015-06-10: qty 1

## 2015-06-10 MED ORDER — ACETAMINOPHEN 325 MG PO TABS
ORAL_TABLET | ORAL | Status: AC
  Filled 2015-06-10: qty 2

## 2015-06-10 MED ORDER — ACETAMINOPHEN 325 MG PO TABS
650.0000 mg | ORAL_TABLET | Freq: Once | ORAL | Status: AC
  Administered 2015-06-10: 650 mg via ORAL

## 2015-06-10 MED ORDER — SODIUM CHLORIDE 0.9 % IV SOLN
Freq: Once | INTRAVENOUS | Status: AC
  Administered 2015-06-10: 13:00:00 via INTRAVENOUS

## 2015-06-10 MED ORDER — RAMUCIRUMAB CHEMO INJECTION 500 MG/50ML
10.0000 mg/kg | Freq: Once | INTRAVENOUS | Status: AC
  Administered 2015-06-10: 800 mg via INTRAVENOUS
  Filled 2015-06-10: qty 70

## 2015-06-10 MED ORDER — DOCETAXEL CHEMO INJECTION 160 MG/16ML
75.0000 mg/m2 | Freq: Once | INTRAVENOUS | Status: AC
  Administered 2015-06-10: 150 mg via INTRAVENOUS
  Filled 2015-06-10: qty 15

## 2015-06-10 MED ORDER — HEPARIN SOD (PORK) LOCK FLUSH 100 UNIT/ML IV SOLN
500.0000 [IU] | Freq: Once | INTRAVENOUS | Status: AC | PRN
  Administered 2015-06-10: 500 [IU]
  Filled 2015-06-10: qty 5

## 2015-06-10 MED ORDER — SODIUM CHLORIDE 0.9% FLUSH
10.0000 mL | INTRAVENOUS | Status: DC | PRN
  Administered 2015-06-10: 10 mL
  Filled 2015-06-10: qty 10

## 2015-06-10 NOTE — Patient Instructions (Signed)
[facility] Discharge Instructions for Patients Receiving Chemotherapy  Today you received the following chemotherapy agents Cyramza and Taxotere To help prevent nausea and vomiting after your treatment, we encourage you to take your nausea medication as directed   If you develop nausea and vomiting that is not controlled by your nausea medication, call the clinic.   BELOW ARE SYMPTOMS THAT SHOULD BE REPORTED IMMEDIATELY:  *FEVER GREATER THAN 100.5 F  *CHILLS WITH OR WITHOUT FEVER  NAUSEA AND VOMITING THAT IS NOT CONTROLLED WITH YOUR NAUSEA MEDICATION  *UNUSUAL SHORTNESS OF BREATH  *UNUSUAL BRUISING OR BLEEDING  TENDERNESS IN MOUTH AND THROAT WITH OR WITHOUT PRESENCE OF ULCERS  *URINARY PROBLEMS  *BOWEL PROBLEMS  UNUSUAL RASH Items with * indicate a potential emergency and should be followed up as soon as possible.  Feel free to call the clinic you have any questions or concerns. The clinic phone number is [phone].  Please show the CHEMO ALERT CARD at check-in to the Emergency Department and triage nurse.   

## 2015-06-10 NOTE — Telephone Encounter (Signed)
Gave pt appt & avs °

## 2015-06-10 NOTE — Progress Notes (Signed)
Nutrition follow up completed with patient and wife in chemotherapy. Patient is pleased with weight gain. Current weight documented as 175.2 pounds increased from 167.7 pounds. Patient states he is eating well and continues to drink Ensure plus. Reports N/V twice this week.  Nutrition Diagnosis:  Unintended weight loss improved.  Intervention:   Educated patient to take nausea medication as directed at the first sign of nausea. Recommended patient continue oral nutrition supplements as needed. Provided coupons for patient. Questions answered and teach back method used.  Monitoring, Evaluation, goals:  Patient will tolerate adequate calories and protein to promote weight maintenance.  Next Visit: Tuesday, June 6 during infusion.  **Disclaimer: This note was dictated with voice recognition software. Similar sounding words can inadvertently be transcribed and this note may contain transcription errors which may not have been corrected upon publication of note.**

## 2015-06-10 NOTE — Progress Notes (Signed)
Okay to treat today with Dr. Julien Nordmann notes from today.

## 2015-06-10 NOTE — Telephone Encounter (Signed)
Let message for added may labs

## 2015-06-10 NOTE — Progress Notes (Signed)
[facility] [facility] [phone]   [phone]  OFFICE PROGRESS NOTE  [name], MD [address] [place] [address]  DIAGNOSIS: Stage IV (T2a, N3, M1b) non-small cell lung cancer, squamous cell carcinoma diagnosed in April 2016.  PRIOR THERAPY:  1) Systemic chemotherapy with carboplatin for AUC of 5 given on day 1 and gemcitabine 1000 MG/M2 on days 1 and 8 every 3 weeks. Status post 6 cycles with partial response. 2) Nivolumab 240 MG IV every 2 weeks status post 4 cycles, last dose was given 03/18/2015 discontinued today secondary to disease progression.  CURRENT THERAPY: Systemic chemotherapy with docetaxel 75 MG/M2 and Cyramza 10 MG/KG every 3 weeks, first dose 04/08/2015. Status post 3 cycles.  CODE STATUS: Full code initially but no prolonged resuscitation.   INTERVAL HISTORY: Keith Holloway 55 y.o. male returns to the clinic today for follow-up visit accompanied by his wife. The patient is feeling fine today with no specific complaints except for drainage from the small boil in the lower back. He tolerated the third cycle of his systemic chemotherapy with docetaxel and Cyramza fairly well. He noticed improvement in his swallowing after starting the chemotherapy. He denied having any significant chest pain, shortness breath or hemoptysis. The patient denied having any fever or chills. He has no nausea or vomiting. The patient denied having any bleeding issues. He had repeat CT scan of the chest, abdomen and pelvis performed recently and he is here today for evaluation and discussion of his scan results before starting cycle #4 of his chemotherapy.  MEDICAL HISTORY: Past Medical History  Diagnosis Date  . Diabetes mellitus without complication ([place])   . Hypertension   . High cholesterol   . Migraine   . Depression   . Arthritis   . Alcohol abuse   . Polysubstance abuse     alcohol and cocaine  . Polyneuropathy in diabetes(357.2)   . Memory  disorder 08/22/2013  . CHF (congestive heart failure) ([place])   . Tobacco abuse counseling 10/09/2014  . Legally blind   . Cancer ([place])     Lung Cancer  . Insomnia 05/20/2015    ALLERGIES:  has No Known Allergies.  MEDICATIONS:  Current Outpatient Prescriptions  Medication Sig Dispense Refill  . aspirin EC 81 MG tablet Take 1 tablet by mouth daily.    . benzonatate (TESSALON) 100 MG capsule TAKE ONE CAPSULE EVERY 8 HOURS 21 capsule 0  . cholecalciferol (VITAMIN D) 1000 units tablet Take 1,000 Units by mouth daily.    Marland Kitchen dexamethasone (DECADRON) 4 MG tablet 2 tablets by mouth twice a day the day before, day of and day after the chemotherapy every 3 weeks 40 tablet 1  . Ferrous Sulfate (IRON) 325 (65 Fe) MG TABS Take 1 tablet by mouth daily.    Marland Kitchen HYDROcodone-acetaminophen (NORCO/VICODIN) 5-325 MG tablet Take 2 tablets by mouth every 6 (six) hours as needed. 60 tablet 0  . insulin NPH-regular Human (NOVOLIN 70/30) (70-30) 100 UNIT/ML injection Inject 35 Units into the skin 2 (two) times daily with a meal. 10 mL 11  . lidocaine-prilocaine (EMLA) cream Apply 1 application topically as needed. 30 g 0  . losartan-hydrochlorothiazide (HYZAAR) 100-25 MG per tablet Take 1 tablet by mouth daily.  2  . metFORMIN (GLUCOPHAGE) 1000 MG tablet Take 1 tablet (1,000 mg total) by mouth 2 (two) times daily. 60 tablet 12  . nicotine (NICODERM CQ - DOSED IN MG/24 HOURS) 14 mg/24hr patch PLACE 1  PATCH (14 MG TOTAL) ONTO THE SKIN DAILY. 28 patch 0  . ondansetron (ZOFRAN) 8 MG tablet TAKE 1 TABLET EVERY 8 HOURS AS NEEDED FOR NAUSEA AND VOMITING 20 tablet 1  . prochlorperazine (COMPAZINE) 10 MG tablet TAKE 1 TABLET (10 MG TOTAL) BY MOUTH EVERY 6 (SIX) HOURS AS NEEDED FOR NAUSEA OR VOMITING. 30 tablet 0  . simvastatin (ZOCOR) 40 MG tablet Take 1 tablet (40 mg total) by mouth daily. 30 tablet 3  . temazepam (RESTORIL) 30 MG capsule Take 1 capsule (30 mg total) by mouth at bedtime. Reported on 05/20/2015 30 capsule 0  .  vitamin C (ASCORBIC ACID) 500 MG tablet Take 500 mg by mouth daily.     No current facility-administered medications for this visit.    SURGICAL HISTORY:  Past Surgical History  Procedure Laterality Date  . Eye surgery  04/2012    detatched retna, bilateral  . Eye surgery  08/2012  . Cataract extraction      bilaterally  . Retinol detachment Bilateral 2014    REVIEW OF SYSTEMS:  Constitutional: positive for fatigue Eyes: negative Ears, nose, mouth, throat, and face: negative Respiratory: negative Cardiovascular: negative Gastrointestinal: negative Genitourinary:negative Integument/breast: negative Hematologic/lymphatic: negative Musculoskeletal:negative Neurological: negative Behavioral/Psych: negative Endocrine: negative Allergic/Immunologic: negative   PHYSICAL EXAMINATION: General appearance: alert, cooperative, fatigued and no distress Head: Normocephalic, without obvious abnormality, atraumatic Neck: no adenopathy, no JVD, supple, symmetrical, trachea midline and thyroid not enlarged, symmetric, no tenderness/mass/nodules Lymph nodes: Cervical, supraclavicular, and axillary nodes normal. Resp: clear to auscultation bilaterally Back: symmetric, no curvature. ROM normal. No CVA tenderness. Cardio: regular rate and rhythm, S1, S2 normal, no murmur, click, rub or gallop GI: soft, non-tender; bowel sounds normal; no masses,  no organomegaly Extremities: extremities normal, atraumatic, no cyanosis or edema Neurologic: Alert and oriented X 3, normal strength and tone. Normal symmetric reflexes. Normal coordination and gait  ECOG PERFORMANCE STATUS: 1 - Symptomatic but completely ambulatory  There were no vitals taken for this visit.  LABORATORY DATA: Lab Results  Component Value Date   WBC 30.7* 05/20/2015   HGB 9.5* 05/20/2015   HCT 30.9* 05/20/2015   MCV 68.1* 05/20/2015   PLT 285 05/20/2015      Chemistry      Component Value Date/Time   NA 139 05/20/2015  1104   NA 142 01/30/2015 1330   K 4.9 05/20/2015 1104   K 4.4 01/30/2015 1330   CL 106 01/30/2015 1330   CO2 25 05/20/2015 1104   CO2 27 01/30/2015 1330   BUN 21.8 05/20/2015 1104   BUN 15 01/30/2015 1330   CREATININE 1.4* 05/20/2015 1104   CREATININE 1.43* 01/30/2015 1330   CREATININE 1.02 01/23/2014 1313      Component Value Date/Time   CALCIUM 9.6 05/20/2015 1104   CALCIUM 9.7 01/30/2015 1330   ALKPHOS 118 05/20/2015 1104   ALKPHOS 108 05/05/2014 2049   AST 12 05/20/2015 1104   AST 20 05/05/2014 2049   ALT 11 05/20/2015 1104   ALT 24 05/05/2014 2049   BILITOT <0.30 05/20/2015 1104   BILITOT 0.5 05/05/2014 2049       RADIOGRAPHIC STUDIES: Ct Chest W Contrast  06/09/2015  CLINICAL DATA:  Followup stage IV right non-small cell lung carcinoma. EXAM: CT CHEST, ABDOMEN, AND PELVIS WITH CONTRAST TECHNIQUE: Multidetector CT imaging of the chest, abdomen and pelvis was performed following the standard protocol during bolus administration of intravenous contrast. CONTRAST:  140m ISOVUE-300 IOPAMIDOL (ISOVUE-300) INJECTION 61% COMPARISON:  03/31/2015 FINDINGS: CT  CHEST FINDINGS Mediastinum/Lymph Nodes: Anterior mediastinal mass shows decrease in size, currently measuring 4.2 x 6.4 cm on image 28/ series 2 compared to 4.7 x 8.9 cm previously. Subcarinal lymph node on image 34/series 2 currently measures 11 mm compared to 17 mm previously. No new or increased areas of lymphadenopathy identified. Lungs/Pleura: Left pleural effusion has decreased in size. Multiple ill-defined bilateral pulmonary nodules and masslike opacities show decrease in size since previous study. Index lesion in the central left lower lobe currently measures 3.7 x 3.8 cm on image 37/series 2 compared to 5.6 x 6.1 cm previously. Other index nodule in the left upper lobe measures 1.3 x 2.2 cm on image 58/series 7 compared to 2.5 x 2.7 cm previously. Musculoskeletal: No chest wall mass or suspicious bone lesions identified. CT  ABDOMEN PELVIS FINDINGS Hepatobiliary: An ill-defined low-attenuation lesion is seen in the peripheral right hepatic lobe on image 51/series 2, which currently measures approximately 2.2 cm compared to 2.8 cm previously. A second low-attenuation lesion in the inferior right hepatic lobe on image 66 measures 1.2 cm and was probably present on previous study although comparison is difficult due to lack of IV contrast on prior exam. Gallbladder is unremarkable. Pancreas: No mass, inflammatory changes, or other significant abnormality. Spleen: Within normal limits in size and appearance. Adrenals/Urinary Tract: No masses identified. No evidence of hydronephrosis. Stomach/Bowel: No evidence of obstruction, inflammatory process, or abnormal fluid collections. Vascular/Lymphatic: No pathologically enlarged lymph nodes. No evidence of abdominal aortic aneurysm. Reproductive: No mass or other significant abnormality. Other: None. Musculoskeletal:  No suspicious bone lesions identified. IMPRESSION: Decreased size of dominant anterior mediastinal mass or lymphadenopathy since previous study. Decreased bilateral pulmonary metastases and left pleural effusion. Probable small right hepatic lobe metastases are difficult to compare to previous unenhanced exam, but likely also decreased compared to prior exam. No new or progressive disease identified. Electronically Signed   By: Earle Gell M.D.   On: 06/09/2015 09:59   Ct Abdomen Pelvis W Contrast  06/09/2015  CLINICAL DATA:  Followup stage IV right non-small cell lung carcinoma. EXAM: CT CHEST, ABDOMEN, AND PELVIS WITH CONTRAST TECHNIQUE: Multidetector CT imaging of the chest, abdomen and pelvis was performed following the standard protocol during bolus administration of intravenous contrast. CONTRAST:  169m ISOVUE-300 IOPAMIDOL (ISOVUE-300) INJECTION 61% COMPARISON:  03/31/2015 FINDINGS: CT CHEST FINDINGS Mediastinum/Lymph Nodes: Anterior mediastinal mass shows decrease in  size, currently measuring 4.2 x 6.4 cm on image 28/ series 2 compared to 4.7 x 8.9 cm previously. Subcarinal lymph node on image 34/series 2 currently measures 11 mm compared to 17 mm previously. No new or increased areas of lymphadenopathy identified. Lungs/Pleura: Left pleural effusion has decreased in size. Multiple ill-defined bilateral pulmonary nodules and masslike opacities show decrease in size since previous study. Index lesion in the central left lower lobe currently measures 3.7 x 3.8 cm on image 37/series 2 compared to 5.6 x 6.1 cm previously. Other index nodule in the left upper lobe measures 1.3 x 2.2 cm on image 58/series 7 compared to 2.5 x 2.7 cm previously. Musculoskeletal: No chest wall mass or suspicious bone lesions identified. CT ABDOMEN PELVIS FINDINGS Hepatobiliary: An ill-defined low-attenuation lesion is seen in the peripheral right hepatic lobe on image 51/series 2, which currently measures approximately 2.2 cm compared to 2.8 cm previously. A second low-attenuation lesion in the inferior right hepatic lobe on image 66 measures 1.2 cm and was probably present on previous study although comparison is difficult due to lack of IV contrast  on prior exam. Gallbladder is unremarkable. Pancreas: No mass, inflammatory changes, or other significant abnormality. Spleen: Within normal limits in size and appearance. Adrenals/Urinary Tract: No masses identified. No evidence of hydronephrosis. Stomach/Bowel: No evidence of obstruction, inflammatory process, or abnormal fluid collections. Vascular/Lymphatic: No pathologically enlarged lymph nodes. No evidence of abdominal aortic aneurysm. Reproductive: No mass or other significant abnormality. Other: None. Musculoskeletal:  No suspicious bone lesions identified. IMPRESSION: Decreased size of dominant anterior mediastinal mass or lymphadenopathy since previous study. Decreased bilateral pulmonary metastases and left pleural effusion. Probable small right  hepatic lobe metastases are difficult to compare to previous unenhanced exam, but likely also decreased compared to prior exam. No new or progressive disease identified. Electronically Signed   By: Earle Gell M.D.   On: 06/09/2015 09:59    ASSESSMENT AND PLAN: This is a very pleasant 55 years old African-American male with a stage IV non-small cell lung cancer, squamous cell carcinoma who completed systemic chemotherapy with carboplatin and gemcitabine status post 6 cycles. The patient has tolerated his treatment fairly well with no significant adverse effects except for mild fatigue and nausea. He had evidence for disease progression and the patient was started on treatment with immunotherapy with Nivolumab currently status post 4 cycles. Unfortunately the recent CT scan of the chest, abdomen and pelvis showed interval progression of his disease with increase in the size of the dominant anterior mediastinal mass and left lower lobe perihilar mass. There was also development of new subcarinal adenopathy and multiple new pulmonary nodules as well as questionable liver lesion. He is currently undergoing systemic chemotherapy with docetaxel and Cyramza status post 3 cycles. He Is tolerating his treatment fairly well. The recent CT scan of the chest, abdomen and pelvis showed improvement of his disease with decrease in the dominant anterior mediastinal mass or adenopathy as well as decrease in the bilateral pulmonary metastatic disease and left pleural effusion. I discussed the scan results with the patient and his wife. I recommended for him to continue on the current chemotherapy. The patient will proceed with cycle #4 today as scheduled. For insomnia, he will continue on Restoril 30 mg by mouth daily at bedtime when necessary. He would come back for follow-up visit in 3 weeks for reevaluation before starting cycle #4. For the small boil on the lower back, I will start the patient on Keflex 500 mg by  mouth 4 times a day for 7 days and he was advised to see his primary care physician for evaluation.  He was advised to call immediately if he has any concerning symptoms in the interval. The patient voices understanding of current disease status and treatment options and is in agreement with the current care plan.  All questions were answered. The patient knows to call the clinic with any problems, questions or concerns. We can certainly see the patient much sooner if necessary.  Disclaimer: This note was dictated with voice recognition software. Similar sounding words can inadvertently be transcribed and may not be corrected upon review.

## 2015-06-12 ENCOUNTER — Ambulatory Visit (HOSPITAL_BASED_OUTPATIENT_CLINIC_OR_DEPARTMENT_OTHER): Payer: Medicare Other

## 2015-06-12 VITALS — BP 115/66 | HR 78 | Temp 97.5°F

## 2015-06-12 DIAGNOSIS — C3492 Malignant neoplasm of unspecified part of left bronchus or lung: Secondary | ICD-10-CM

## 2015-06-12 DIAGNOSIS — C3432 Malignant neoplasm of lower lobe, left bronchus or lung: Secondary | ICD-10-CM | POA: Diagnosis not present

## 2015-06-12 MED ORDER — PEGFILGRASTIM INJECTION 6 MG/0.6ML ~~LOC~~
6.0000 mg | PREFILLED_SYRINGE | Freq: Once | SUBCUTANEOUS | Status: AC
  Administered 2015-06-12: 6 mg via SUBCUTANEOUS
  Filled 2015-06-12: qty 0.6

## 2015-06-15 ENCOUNTER — Telehealth: Payer: Self-pay | Admitting: Internal Medicine

## 2015-06-15 NOTE — Telephone Encounter (Signed)
Previous documentation applies to 6/6 visit.

## 2015-06-15 NOTE — Telephone Encounter (Signed)
Due to lab meeting adjusted appointment start time from 8 am to 8:30 am. Patient to be given new schedule at next visit 5/2.

## 2015-06-17 ENCOUNTER — Telehealth: Payer: Self-pay | Admitting: Internal Medicine

## 2015-06-17 ENCOUNTER — Other Ambulatory Visit (HOSPITAL_BASED_OUTPATIENT_CLINIC_OR_DEPARTMENT_OTHER): Payer: Medicare Other

## 2015-06-17 DIAGNOSIS — C3432 Malignant neoplasm of lower lobe, left bronchus or lung: Secondary | ICD-10-CM

## 2015-06-17 DIAGNOSIS — C3492 Malignant neoplasm of unspecified part of left bronchus or lung: Secondary | ICD-10-CM

## 2015-06-17 LAB — CBC WITH DIFFERENTIAL/PLATELET
BASO%: 0.5 % (ref 0.0–2.0)
BASOS ABS: 0 10*3/uL (ref 0.0–0.1)
EOS ABS: 0 10*3/uL (ref 0.0–0.5)
EOS%: 0.5 % (ref 0.0–7.0)
HEMATOCRIT: 25.9 % — AB (ref 38.4–49.9)
HGB: 8.5 g/dL — ABNORMAL LOW (ref 13.0–17.1)
LYMPH%: 19.4 % (ref 14.0–49.0)
MCH: 22.4 pg — ABNORMAL LOW (ref 27.2–33.4)
MCHC: 32.8 g/dL (ref 32.0–36.0)
MCV: 68.2 fL — AB (ref 79.3–98.0)
MONO#: 1.1 10*3/uL — AB (ref 0.1–0.9)
MONO%: 17.5 % — AB (ref 0.0–14.0)
NEUT#: 3.9 10*3/uL (ref 1.5–6.5)
NEUT%: 62.1 % (ref 39.0–75.0)
PLATELETS: 83 10*3/uL — AB (ref 140–400)
RBC: 3.8 10*6/uL — AB (ref 4.20–5.82)
RDW: 19.1 % — ABNORMAL HIGH (ref 11.0–14.6)
WBC: 6.3 10*3/uL (ref 4.0–10.3)
lymph#: 1.2 10*3/uL (ref 0.9–3.3)
nRBC: 0 % (ref 0–0)

## 2015-06-17 LAB — COMPREHENSIVE METABOLIC PANEL
ALT: 18 U/L (ref 0–55)
ANION GAP: 7 meq/L (ref 3–11)
AST: 19 U/L (ref 5–34)
Albumin: 3.3 g/dL — ABNORMAL LOW (ref 3.5–5.0)
Alkaline Phosphatase: 126 U/L (ref 40–150)
BILIRUBIN TOTAL: 0.36 mg/dL (ref 0.20–1.20)
BUN: 15.8 mg/dL (ref 7.0–26.0)
CALCIUM: 9.2 mg/dL (ref 8.4–10.4)
CHLORIDE: 107 meq/L (ref 98–109)
CO2: 26 meq/L (ref 22–29)
CREATININE: 1.4 mg/dL — AB (ref 0.7–1.3)
EGFR: 66 mL/min/{1.73_m2} — AB (ref >90)
Glucose: 142 mg/dl — ABNORMAL HIGH (ref 70–140)
Potassium: 4.7 mEq/L (ref 3.5–5.1)
Sodium: 141 mEq/L (ref 136–145)
Total Protein: 6.7 g/dL (ref 6.4–8.3)

## 2015-06-17 NOTE — Telephone Encounter (Signed)
pt came by to get updated sch-printed

## 2015-06-18 ENCOUNTER — Other Ambulatory Visit: Payer: Self-pay | Admitting: Internal Medicine

## 2015-06-24 ENCOUNTER — Other Ambulatory Visit (HOSPITAL_BASED_OUTPATIENT_CLINIC_OR_DEPARTMENT_OTHER): Payer: Medicare Other

## 2015-06-24 DIAGNOSIS — C3432 Malignant neoplasm of lower lobe, left bronchus or lung: Secondary | ICD-10-CM | POA: Diagnosis not present

## 2015-06-24 DIAGNOSIS — C3492 Malignant neoplasm of unspecified part of left bronchus or lung: Secondary | ICD-10-CM

## 2015-06-24 LAB — COMPREHENSIVE METABOLIC PANEL
ALT: 14 U/L (ref 0–55)
AST: 21 U/L (ref 5–34)
Albumin: 3.9 g/dL (ref 3.5–5.0)
Alkaline Phosphatase: 133 U/L (ref 40–150)
Anion Gap: 7 mEq/L (ref 3–11)
BUN: 14.9 mg/dL (ref 7.0–26.0)
CHLORIDE: 108 meq/L (ref 98–109)
CO2: 24 meq/L (ref 22–29)
CREATININE: 1.7 mg/dL — AB (ref 0.7–1.3)
Calcium: 9.5 mg/dL (ref 8.4–10.4)
EGFR: 51 mL/min/{1.73_m2} — ABNORMAL LOW (ref >90)
GLUCOSE: 91 mg/dL (ref 70–140)
POTASSIUM: 4.7 meq/L (ref 3.5–5.1)
SODIUM: 139 meq/L (ref 136–145)
Total Bilirubin: <0.3 mg/dL (ref 0.20–1.20)
Total Protein: 7.3 g/dL (ref 6.4–8.3)

## 2015-06-24 LAB — CBC WITH DIFFERENTIAL/PLATELET
BASO%: 0.3 % (ref 0.0–2.0)
Basophils Absolute: 0.1 10*3/uL (ref 0.0–0.1)
EOS%: 0.2 % (ref 0.0–7.0)
Eosinophils Absolute: 0 10*3/uL (ref 0.0–0.5)
HEMATOCRIT: 32.2 % — AB (ref 38.4–49.9)
HGB: 10.1 g/dL — ABNORMAL LOW (ref 13.0–17.1)
LYMPH#: 2 10*3/uL (ref 0.9–3.3)
LYMPH%: 10 % — ABNORMAL LOW (ref 14.0–49.0)
MCH: 21.9 pg — ABNORMAL LOW (ref 27.2–33.4)
MCHC: 31.2 g/dL — AB (ref 32.0–36.0)
MCV: 70.2 fL — ABNORMAL LOW (ref 79.3–98.0)
MONO#: 1.5 10*3/uL — ABNORMAL HIGH (ref 0.1–0.9)
MONO%: 7.7 % (ref 0.0–14.0)
NEUT%: 81.8 % — AB (ref 39.0–75.0)
NEUTROS ABS: 16.1 10*3/uL — AB (ref 1.5–6.5)
Platelets: 176 10*3/uL (ref 140–400)
RBC: 4.59 10*6/uL (ref 4.20–5.82)
RDW: 21.1 % — ABNORMAL HIGH (ref 11.0–14.6)
WBC: 19.7 10*3/uL — AB (ref 4.0–10.3)

## 2015-07-01 ENCOUNTER — Ambulatory Visit (HOSPITAL_BASED_OUTPATIENT_CLINIC_OR_DEPARTMENT_OTHER): Payer: Medicare Other | Admitting: Internal Medicine

## 2015-07-01 ENCOUNTER — Telehealth: Payer: Self-pay | Admitting: *Deleted

## 2015-07-01 ENCOUNTER — Encounter: Payer: Self-pay | Admitting: Internal Medicine

## 2015-07-01 ENCOUNTER — Other Ambulatory Visit (HOSPITAL_BASED_OUTPATIENT_CLINIC_OR_DEPARTMENT_OTHER): Payer: Medicare Other

## 2015-07-01 ENCOUNTER — Telehealth: Payer: Self-pay | Admitting: Internal Medicine

## 2015-07-01 ENCOUNTER — Ambulatory Visit (HOSPITAL_BASED_OUTPATIENT_CLINIC_OR_DEPARTMENT_OTHER): Payer: Medicare Other

## 2015-07-01 ENCOUNTER — Other Ambulatory Visit: Payer: Self-pay | Admitting: *Deleted

## 2015-07-01 VITALS — BP 127/75 | HR 81 | Temp 97.8°F | Resp 18 | Ht 71.0 in | Wt 168.5 lb

## 2015-07-01 DIAGNOSIS — G4700 Insomnia, unspecified: Secondary | ICD-10-CM

## 2015-07-01 DIAGNOSIS — C3490 Malignant neoplasm of unspecified part of unspecified bronchus or lung: Secondary | ICD-10-CM

## 2015-07-01 DIAGNOSIS — C3492 Malignant neoplasm of unspecified part of left bronchus or lung: Secondary | ICD-10-CM

## 2015-07-01 DIAGNOSIS — C3432 Malignant neoplasm of lower lobe, left bronchus or lung: Secondary | ICD-10-CM

## 2015-07-01 DIAGNOSIS — Z5112 Encounter for antineoplastic immunotherapy: Secondary | ICD-10-CM

## 2015-07-01 DIAGNOSIS — Z5111 Encounter for antineoplastic chemotherapy: Secondary | ICD-10-CM

## 2015-07-01 DIAGNOSIS — Z79899 Other long term (current) drug therapy: Secondary | ICD-10-CM | POA: Diagnosis not present

## 2015-07-01 LAB — COMPREHENSIVE METABOLIC PANEL
ALBUMIN: 4.1 g/dL (ref 3.5–5.0)
ALT: 15 U/L (ref 0–55)
ANION GAP: 7 meq/L (ref 3–11)
AST: 13 U/L (ref 5–34)
Alkaline Phosphatase: 91 U/L (ref 40–150)
BUN: 26.6 mg/dL — ABNORMAL HIGH (ref 7.0–26.0)
CO2: 21 meq/L — AB (ref 22–29)
CREATININE: 2.2 mg/dL — AB (ref 0.7–1.3)
Calcium: 9.7 mg/dL (ref 8.4–10.4)
Chloride: 108 mEq/L (ref 98–109)
EGFR: 39 mL/min/{1.73_m2} — ABNORMAL LOW (ref >90)
GLUCOSE: 281 mg/dL — AB (ref 70–140)
Potassium: 5.8 mEq/L — ABNORMAL HIGH (ref 3.5–5.1)
Sodium: 136 mEq/L (ref 136–145)
TOTAL PROTEIN: 7.7 g/dL (ref 6.4–8.3)
Total Bilirubin: <0.3 mg/dL (ref 0.20–1.20)

## 2015-07-01 LAB — CBC WITH DIFFERENTIAL/PLATELET
BASO%: 0.1 % (ref 0.0–2.0)
BASOS ABS: 0 10*3/uL (ref 0.0–0.1)
EOS ABS: 0 10*3/uL (ref 0.0–0.5)
EOS%: 0 % (ref 0.0–7.0)
HCT: 31.3 % — ABNORMAL LOW (ref 38.4–49.9)
HGB: 10.4 g/dL — ABNORMAL LOW (ref 13.0–17.1)
LYMPH%: 4 % — ABNORMAL LOW (ref 14.0–49.0)
MCH: 22.9 pg — ABNORMAL LOW (ref 27.2–33.4)
MCHC: 33.2 g/dL (ref 32.0–36.0)
MCV: 68.8 fL — ABNORMAL LOW (ref 79.3–98.0)
MONO#: 0.4 10*3/uL (ref 0.1–0.9)
MONO%: 2 % (ref 0.0–14.0)
NEUT#: 18.2 10*3/uL — ABNORMAL HIGH (ref 1.5–6.5)
NEUT%: 93.9 % — AB (ref 39.0–75.0)
NRBC: 0 % (ref 0–0)
PLATELETS: 270 10*3/uL (ref 140–400)
RBC: 4.55 10*6/uL (ref 4.20–5.82)
RDW: 20.3 % — ABNORMAL HIGH (ref 11.0–14.6)
WBC: 19.4 10*3/uL — ABNORMAL HIGH (ref 4.0–10.3)
lymph#: 0.8 10*3/uL — ABNORMAL LOW (ref 0.9–3.3)

## 2015-07-01 LAB — BASIC METABOLIC PANEL
ANION GAP: 5 meq/L (ref 3–11)
BUN: 28.2 mg/dL — ABNORMAL HIGH (ref 7.0–26.0)
CALCIUM: 8.7 mg/dL (ref 8.4–10.4)
CO2: 19 mEq/L — ABNORMAL LOW (ref 22–29)
Chloride: 108 mEq/L (ref 98–109)
Creatinine: 1.9 mg/dL — ABNORMAL HIGH (ref 0.7–1.3)
EGFR: 45 mL/min/{1.73_m2} — AB (ref >90)
GLUCOSE: 283 mg/dL — AB (ref 70–140)
SODIUM: 132 meq/L — AB (ref 136–145)

## 2015-07-01 LAB — UA PROTEIN, DIPSTICK - CHCC: Protein, ur: <30 mg/dL

## 2015-07-01 MED ORDER — SODIUM POLYSTYRENE SULFONATE 15 GM/60ML PO SUSP: 30.0000 g | Freq: Once | ORAL | Status: DC

## 2015-07-01 MED ORDER — DOCETAXEL CHEMO INJECTION 160 MG/16ML
75.0000 mg/m2 | Freq: Once | INTRAVENOUS | Status: AC
  Administered 2015-07-01: 150 mg via INTRAVENOUS
  Filled 2015-07-01: qty 15

## 2015-07-01 MED ORDER — DIPHENHYDRAMINE HCL 50 MG/ML IJ SOLN
50.0000 mg | Freq: Once | INTRAMUSCULAR | Status: AC
  Administered 2015-07-01: 50 mg via INTRAVENOUS

## 2015-07-01 MED ORDER — DIPHENHYDRAMINE HCL 50 MG/ML IJ SOLN
INTRAMUSCULAR | Status: AC
Start: 2015-07-01 — End: 2015-07-01
  Filled 2015-07-01: qty 1

## 2015-07-01 MED ORDER — ACETAMINOPHEN 325 MG PO TABS
650.0000 mg | ORAL_TABLET | Freq: Once | ORAL | Status: AC
  Administered 2015-07-01: 650 mg via ORAL

## 2015-07-01 MED ORDER — SODIUM CHLORIDE 0.9 % IV SOLN
10.0000 mg/kg | Freq: Once | INTRAVENOUS | Status: AC
  Administered 2015-07-01: 800 mg via INTRAVENOUS
  Filled 2015-07-01: qty 70

## 2015-07-01 MED ORDER — SODIUM CHLORIDE 0.9 % IV SOLN
10.0000 mg | Freq: Once | INTRAVENOUS | Status: AC
  Administered 2015-07-01: 10 mg via INTRAVENOUS
  Filled 2015-07-01: qty 1

## 2015-07-01 MED ORDER — SODIUM CHLORIDE 0.9 % IV SOLN
Freq: Once | INTRAVENOUS | Status: AC
  Administered 2015-07-01: 13:00:00 via INTRAVENOUS

## 2015-07-01 MED ORDER — ACETAMINOPHEN 325 MG PO TABS
ORAL_TABLET | ORAL | Status: AC
  Filled 2015-07-01: qty 2

## 2015-07-01 MED ORDER — SODIUM CHLORIDE 0.9% FLUSH
10.0000 mL | INTRAVENOUS | Status: DC | PRN
  Administered 2015-07-01: 10 mL
  Filled 2015-07-01: qty 10

## 2015-07-01 MED ORDER — HEPARIN SOD (PORK) LOCK FLUSH 100 UNIT/ML IV SOLN
500.0000 [IU] | Freq: Once | INTRAVENOUS | Status: AC | PRN
  Administered 2015-07-01: 500 [IU]
  Filled 2015-07-01: qty 5

## 2015-07-01 NOTE — Progress Notes (Signed)
Quick Note:  Call patient with the result and repeat B Met today ______

## 2015-07-01 NOTE — Progress Notes (Signed)
[place] [phone]   [phone]  OFFICE PROGRESS NOTE  [name], MD 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  PRIOR THERAPY:  1) Systemic chemotherapy with carboplatin for AUC of 5 given on day 1 and gemcitabine 1000 MG/M2 on days 1 and 8 every 3 weeks. Status post 6 cycles with partial response. 2) Nivolumab 240 MG IV every 2 weeks status post 4 cycles, last dose was given 03/18/2015 discontinued today secondary to disease progression.  CURRENT THERAPY: Systemic chemotherapy with docetaxel 75 MG/M2 and Cyramza 10 MG/KG every 3 weeks, first dose 04/08/2015. Status post 4 cycles.  CODE STATUS: Full code initially but no prolonged resuscitation.   INTERVAL HISTORY: Keith Holloway 55 y.o. male returns to the clinic today for follow-up visit accompanied by his wife. He tolerated the last cycle of his systemic chemotherapy with docetaxel and Cyramza fairly well. He denied having any significant chest pain, shortness breath or hemoptysis. The patient denied having any fever or chills. He has no nausea or vomiting. The patient denied having any bleeding issues. He is here today to start cycle # 5 of his treatment.  MEDICAL HISTORY: Past Medical History  Diagnosis Date  . Diabetes mellitus without complication ([place])   . Hypertension   . High cholesterol   . Migraine   . Depression   . Arthritis   . Alcohol abuse   . Polysubstance abuse     alcohol and cocaine  . Polyneuropathy in diabetes(357.2)   . Memory disorder 08/22/2013  . CHF (congestive heart failure) ([place])   . Tobacco abuse counseling 10/09/2014  . Legally blind   . Cancer ([place])     Lung Cancer  . Insomnia 05/20/2015  . Skin infection 06/10/2015    ALLERGIES:  has No Known Allergies.  MEDICATIONS:  Current Outpatient Prescriptions  Medication  Sig Dispense Refill  . aspirin EC 81 MG tablet Take 1 tablet by mouth daily.    . benzonatate (TESSALON) 100 MG capsule TAKE ONE CAPSULE EVERY 8 HOURS 21 capsule 0  . cephALEXin (KEFLEX) 500 MG capsule Take 1 capsule (500 mg total) by mouth 4 (four) times daily. 28 capsule 0  . cholecalciferol (VITAMIN D) 1000 units tablet Take 1,000 Units by mouth daily.    Marland Kitchen dexamethasone (DECADRON) 4 MG tablet 2 tablets by mouth twice a day the day before, day of and day after the chemotherapy every 3 weeks 40 tablet 1  . Ferrous Sulfate (IRON) 325 (65 Fe) MG TABS Take 1 tablet by mouth daily.    Marland Kitchen HYDROcodone-acetaminophen (NORCO/VICODIN) 5-325 MG tablet Take 2 tablets by mouth every 6 (six) hours as needed. 60 tablet 0  . insulin NPH-regular Human (NOVOLIN 70/30) (70-30) 100 UNIT/ML injection Inject 35 Units into the skin 2 (two) times daily with a meal. 10 mL 11  . lidocaine-prilocaine (EMLA) cream Apply 1 application topically as needed. 30 g 0  . losartan-hydrochlorothiazide (HYZAAR) 100-25 MG per tablet Take 1 tablet by mouth daily.  2  . metFORMIN (GLUCOPHAGE) 1000 MG tablet Take 1 tablet (1,000 mg total) by mouth 2 (two) times daily. 60 tablet 12  . ondansetron (ZOFRAN) 8 MG tablet TAKE 1 TABLET EVERY 8 HOURS AS NEEDED FOR NAUSEA AND VOMITING 20 tablet 1  . prochlorperazine (COMPAZINE) 10 MG tablet TAKE 1 TABLET (10 MG TOTAL)  BY MOUTH EVERY 6 (SIX) HOURS AS NEEDED FOR NAUSEA OR VOMITING. 30 tablet 0  . simvastatin (ZOCOR) 40 MG tablet Take 1 tablet (40 mg total) by mouth daily. 30 tablet 3  . sulfamethoxazole-trimethoprim (BACTRIM DS,SEPTRA DS) 800-160 MG tablet     . temazepam (RESTORIL) 30 MG capsule TAKE ONE CAPSULE AT BEDTIME 30 capsule 0  . vitamin C (ASCORBIC ACID) 500 MG tablet Take 500 mg by mouth daily.     No current facility-administered medications for this visit.    SURGICAL HISTORY:  Past Surgical History  Procedure Laterality Date  . Eye surgery  04/2012    detatched retna,  bilateral  . Eye surgery  08/2012  . Cataract extraction      bilaterally  . Retinol detachment Bilateral 2014    REVIEW OF SYSTEMS:  A comprehensive review of systems was negative except for: Constitutional: positive for fatigue   PHYSICAL EXAMINATION: General appearance: alert, cooperative, fatigued and no distress Head: Normocephalic, without obvious abnormality, atraumatic Neck: no adenopathy, no JVD, supple, symmetrical, trachea midline and thyroid not enlarged, symmetric, no tenderness/mass/nodules Lymph nodes: Cervical, supraclavicular, and axillary nodes normal. Resp: clear to auscultation bilaterally Back: symmetric, no curvature. ROM normal. No CVA tenderness. Cardio: regular rate and rhythm, S1, S2 normal, no murmur, click, rub or gallop GI: soft, non-tender; bowel sounds normal; no masses,  no organomegaly Extremities: extremities normal, atraumatic, no cyanosis or edema Neurologic: Alert and oriented X 3, normal strength and tone. Normal symmetric reflexes. Normal coordination and gait  ECOG PERFORMANCE STATUS: 1 - Symptomatic but completely ambulatory  Blood pressure 127/75, pulse 81, temperature 97.8 F (36.6 C), temperature source Oral, resp. rate 18, height '5\' 11"'$  (1.803 m), weight 168 lb 8 oz (76.431 kg), SpO2 99 %.  LABORATORY DATA: Lab Results  Component Value Date   WBC 19.4* 07/01/2015   HGB 10.4* 07/01/2015   HCT 31.3* 07/01/2015   MCV 68.8* 07/01/2015   PLT 270 07/01/2015      Chemistry      Component Value Date/Time   NA 139 06/24/2015 1138   NA 142 01/30/2015 1330   K 4.7 06/24/2015 1138   K 4.4 01/30/2015 1330   CL 106 01/30/2015 1330   CO2 24 06/24/2015 1138   CO2 27 01/30/2015 1330   BUN 14.9 06/24/2015 1138   BUN 15 01/30/2015 1330   CREATININE 1.7* 06/24/2015 1138   CREATININE 1.43* 01/30/2015 1330   CREATININE 1.02 01/23/2014 1313      Component Value Date/Time   CALCIUM 9.5 06/24/2015 1138   CALCIUM 9.7 01/30/2015 1330   ALKPHOS  133 06/24/2015 1138   ALKPHOS 108 05/05/2014 2049   AST 21 06/24/2015 1138   AST 20 05/05/2014 2049   ALT 14 06/24/2015 1138   ALT 24 05/05/2014 2049   BILITOT <0.30 06/24/2015 1138   BILITOT 0.5 05/05/2014 2049       RADIOGRAPHIC STUDIES: Ct Chest W Contrast  06/09/2015  CLINICAL DATA:  Followup stage IV right non-small cell lung carcinoma. EXAM: CT CHEST, ABDOMEN, AND PELVIS WITH CONTRAST TECHNIQUE: Multidetector CT imaging of the chest, abdomen and pelvis was performed following the standard protocol during bolus administration of intravenous contrast. CONTRAST:  150m ISOVUE-300 IOPAMIDOL (ISOVUE-300) INJECTION 61% COMPARISON:  03/31/2015 FINDINGS: CT CHEST FINDINGS Mediastinum/Lymph Nodes: Anterior mediastinal mass shows decrease in size, currently measuring 4.2 x 6.4 cm on image 28/ series 2 compared to 4.7 x 8.9 cm previously. Subcarinal lymph node on image 34/series 2 currently measures 11  mm compared to 17 mm previously. No new or increased areas of lymphadenopathy identified. Lungs/Pleura: Left pleural effusion has decreased in size. Multiple ill-defined bilateral pulmonary nodules and masslike opacities show decrease in size since previous study. Index lesion in the central left lower lobe currently measures 3.7 x 3.8 cm on image 37/series 2 compared to 5.6 x 6.1 cm previously. Other index nodule in the left upper lobe measures 1.3 x 2.2 cm on image 58/series 7 compared to 2.5 x 2.7 cm previously. Musculoskeletal: No chest wall mass or suspicious bone lesions identified. CT ABDOMEN PELVIS FINDINGS Hepatobiliary: An ill-defined low-attenuation lesion is seen in the peripheral right hepatic lobe on image 51/series 2, which currently measures approximately 2.2 cm compared to 2.8 cm previously. A second low-attenuation lesion in the inferior right hepatic lobe on image 66 measures 1.2 cm and was probably present on previous study although comparison is difficult due to lack of IV contrast on  prior exam. Gallbladder is unremarkable. Pancreas: No mass, inflammatory changes, or other significant abnormality. Spleen: Within normal limits in size and appearance. Adrenals/Urinary Tract: No masses identified. No evidence of hydronephrosis. Stomach/Bowel: No evidence of obstruction, inflammatory process, or abnormal fluid collections. Vascular/Lymphatic: No pathologically enlarged lymph nodes. No evidence of abdominal aortic aneurysm. Reproductive: No mass or other significant abnormality. Other: None. Musculoskeletal:  No suspicious bone lesions identified. IMPRESSION: Decreased size of dominant anterior mediastinal mass or lymphadenopathy since previous study. Decreased bilateral pulmonary metastases and left pleural effusion. Probable small right hepatic lobe metastases are difficult to compare to previous unenhanced exam, but likely also decreased compared to prior exam. No new or progressive disease identified. Electronically Signed   By: Earle Gell M.D.   On: 06/09/2015 09:59   Ct Abdomen Pelvis W Contrast  06/09/2015  CLINICAL DATA:  Followup stage IV right non-small cell lung carcinoma. EXAM: CT CHEST, ABDOMEN, AND PELVIS WITH CONTRAST TECHNIQUE: Multidetector CT imaging of the chest, abdomen and pelvis was performed following the standard protocol during bolus administration of intravenous contrast. CONTRAST:  195m ISOVUE-300 IOPAMIDOL (ISOVUE-300) INJECTION 61% COMPARISON:  03/31/2015 FINDINGS: CT CHEST FINDINGS Mediastinum/Lymph Nodes: Anterior mediastinal mass shows decrease in size, currently measuring 4.2 x 6.4 cm on image 28/ series 2 compared to 4.7 x 8.9 cm previously. Subcarinal lymph node on image 34/series 2 currently measures 11 mm compared to 17 mm previously. No new or increased areas of lymphadenopathy identified. Lungs/Pleura: Left pleural effusion has decreased in size. Multiple ill-defined bilateral pulmonary nodules and masslike opacities show decrease in size since previous  study. Index lesion in the central left lower lobe currently measures 3.7 x 3.8 cm on image 37/series 2 compared to 5.6 x 6.1 cm previously. Other index nodule in the left upper lobe measures 1.3 x 2.2 cm on image 58/series 7 compared to 2.5 x 2.7 cm previously. Musculoskeletal: No chest wall mass or suspicious bone lesions identified. CT ABDOMEN PELVIS FINDINGS Hepatobiliary: An ill-defined low-attenuation lesion is seen in the peripheral right hepatic lobe on image 51/series 2, which currently measures approximately 2.2 cm compared to 2.8 cm previously. A second low-attenuation lesion in the inferior right hepatic lobe on image 66 measures 1.2 cm and was probably present on previous study although comparison is difficult due to lack of IV contrast on prior exam. Gallbladder is unremarkable. Pancreas: No mass, inflammatory changes, or other significant abnormality. Spleen: Within normal limits in size and appearance. Adrenals/Urinary Tract: No masses identified. No evidence of hydronephrosis. Stomach/Bowel: No evidence of obstruction, inflammatory process,  or abnormal fluid collections. Vascular/Lymphatic: No pathologically enlarged lymph nodes. No evidence of abdominal aortic aneurysm. Reproductive: No mass or other significant abnormality. Other: None. Musculoskeletal:  No suspicious bone lesions identified. IMPRESSION: Decreased size of dominant anterior mediastinal mass or lymphadenopathy since previous study. Decreased bilateral pulmonary metastases and left pleural effusion. Probable small right hepatic lobe metastases are difficult to compare to previous unenhanced exam, but likely also decreased compared to prior exam. No new or progressive disease identified. Electronically Signed   By: Earle Gell M.D.   On: 06/09/2015 09:59    ASSESSMENT AND PLAN: This is a very pleasant 55 years old African-American male with a stage IV non-small cell lung cancer, squamous cell carcinoma who completed systemic  chemotherapy with carboplatin and gemcitabine status post 6 cycles. The patient has tolerated his treatment fairly well with no significant adverse effects except for mild fatigue and nausea. He had evidence for disease progression and the patient was started on treatment with immunotherapy with Nivolumab currently status post 4 cycles. Unfortunately the recent CT scan of the chest, abdomen and pelvis showed interval progression of his disease with increase in the size of the dominant anterior mediastinal mass and left lower lobe perihilar mass. There was also development of new subcarinal adenopathy and multiple new pulmonary nodules as well as questionable liver lesion. He is currently undergoing systemic chemotherapy with docetaxel and Cyramza status post 4 cycles. He is tolerating his treatment fairly well. I recommended for him to continue on the current chemotherapy and he will proceed with cycle #5 today. For insomnia, he will continue on Restoril 30 mg by mouth daily at bedtime when necessary. He would come back for follow-up visit in 3 weeks for reevaluation before starting cycle #6. He was advised to call immediately if he has any concerning symptoms in the interval. The patient voices understanding of current disease status and treatment options and is in agreement with the current care plan.  All questions were answered. The patient knows to call the clinic with any problems, questions or concerns. We can certainly see the patient much sooner if necessary.  Disclaimer: This note was dictated with voice recognition software. Similar sounding words can inadvertently be transcribed and may not be corrected upon review.

## 2015-07-01 NOTE — Telephone Encounter (Signed)
Per staff message and POF I have scheduled appts. Advised scheduler of appts. JMW  

## 2015-07-01 NOTE — Telephone Encounter (Signed)
per of to sch pt appt-sent MW email tosch trmt-pt to get updated copy b4 leaving trmt

## 2015-07-01 NOTE — Telephone Encounter (Signed)
K+ 6.8 reviewed with MD, Per MD, Rx for kayexalate 30g e-scribed to pt pharmacy. Called pt, spoke with wife with above results and instructed pt to take 30g/158m once, informed wife pt will have diarrhea, this is to be expected. Called pharmacy to verify dose and order. No further concerns.

## 2015-07-01 NOTE — Progress Notes (Signed)
Per Dr Julien Nordmann, [place] to treat with Crt of 2.2

## 2015-07-03 ENCOUNTER — Other Ambulatory Visit: Payer: Self-pay | Admitting: Medical Oncology

## 2015-07-03 ENCOUNTER — Ambulatory Visit (HOSPITAL_BASED_OUTPATIENT_CLINIC_OR_DEPARTMENT_OTHER): Payer: Medicare Other

## 2015-07-03 VITALS — BP 128/71 | HR 105 | Temp 98.4°F | Resp 22

## 2015-07-03 DIAGNOSIS — C3492 Malignant neoplasm of unspecified part of left bronchus or lung: Secondary | ICD-10-CM

## 2015-07-03 DIAGNOSIS — C3432 Malignant neoplasm of lower lobe, left bronchus or lung: Secondary | ICD-10-CM

## 2015-07-03 DIAGNOSIS — E875 Hyperkalemia: Secondary | ICD-10-CM

## 2015-07-03 LAB — BASIC METABOLIC PANEL
Anion Gap: 8 mEq/L (ref 3–11)
BUN: 31.1 mg/dL — ABNORMAL HIGH (ref 7.0–26.0)
CHLORIDE: 108 meq/L (ref 98–109)
CO2: 22 meq/L (ref 22–29)
Calcium: 9.1 mg/dL (ref 8.4–10.4)
Creatinine: 1.3 mg/dL (ref 0.7–1.3)
EGFR: 72 mL/min/{1.73_m2} — AB (ref >90)
Glucose: 325 mg/dl — ABNORMAL HIGH (ref 70–140)
POTASSIUM: 4.6 meq/L (ref 3.5–5.1)
Sodium: 138 mEq/L (ref 136–145)

## 2015-07-03 MED ORDER — PEGFILGRASTIM INJECTION 6 MG/0.6ML ~~LOC~~
6.0000 mg | PREFILLED_SYRINGE | Freq: Once | SUBCUTANEOUS | Status: AC
  Administered 2015-07-03: 6 mg via SUBCUTANEOUS
  Filled 2015-07-03: qty 0.6

## 2015-07-03 NOTE — Patient Instructions (Signed)
Pegfilgrastim injection What is this medicine? PEGFILGRASTIM (PEG fil gra stim) is a long-acting granulocyte colony-stimulating factor that stimulates the growth of neutrophils, a type of white blood cell important in the body's fight against infection. It is used to reduce the incidence of fever and infection in patients with certain types of cancer who are receiving chemotherapy that affects the bone marrow, and to increase survival after being exposed to high doses of radiation. This medicine may be used for other purposes; ask your health care provider or pharmacist if you have questions. What should I tell my health care provider before I take this medicine? They need to know if you have any of these conditions: -kidney disease -latex allergy -ongoing radiation therapy -sickle cell disease -skin reactions to acrylic adhesives (On-Body Injector only) -an unusual or allergic reaction to pegfilgrastim, filgrastim, other medicines, foods, dyes, or preservatives -pregnant or trying to get pregnant -breast-feeding How should I use this medicine? This medicine is for injection under the skin. If you get this medicine at home, you will be taught how to prepare and give the pre-filled syringe or how to use the On-body Injector. Refer to the patient Instructions for Use for detailed instructions. Use exactly as directed. Take your medicine at regular intervals. Do not take your medicine more often than directed. It is important that you put your used needles and syringes in a special sharps container. Do not put them in a trash can. If you do not have a sharps container, call your pharmacist or healthcare provider to get one. Talk to your pediatrician regarding the use of this medicine in children. While this drug may be prescribed for selected conditions, precautions do apply. Overdosage: If you think you have taken too much of this medicine contact a poison control center or emergency room at  once. NOTE: This medicine is only for you. Do not share this medicine with others. What if I miss a dose? It is important not to miss your dose. Call your doctor or health care professional if you miss your dose. If you miss a dose due to an On-body Injector failure or leakage, a new dose should be administered as soon as possible using a single prefilled syringe for manual use. What may interact with this medicine? Interactions have not been studied. Give your health care provider a list of all the medicines, herbs, non-prescription drugs, or dietary supplements you use. Also tell them if you smoke, drink alcohol, or use illegal drugs. Some items may interact with your medicine. This list may not describe all possible interactions. Give your health care provider a list of all the medicines, herbs, non-prescription drugs, or dietary supplements you use. Also tell them if you smoke, drink alcohol, or use illegal drugs. Some items may interact with your medicine. What should I watch for while using this medicine? You may need blood work done while you are taking this medicine. If you are going to need a MRI, CT scan, or other procedure, tell your doctor that you are using this medicine (On-Body Injector only). What side effects may I notice from receiving this medicine? Side effects that you should report to your doctor or health care professional as soon as possible: -allergic reactions like skin rash, itching or hives, swelling of the face, lips, or tongue -dizziness -fever -pain, redness, or irritation at site where injected -pinpoint red spots on the skin -red or dark-brown urine -shortness of breath or breathing problems -stomach or side pain, or pain   at the shoulder -swelling -tiredness -trouble passing urine or change in the amount of urine Side effects that usually do not require medical attention (report to your doctor or health care professional if they continue or are  bothersome): -bone pain -muscle pain This list may not describe all possible side effects. Call your doctor for medical advice about side effects. You may report side effects to FDA at 1-800-FDA-1088. Where should I keep my medicine? Keep out of the reach of children. Store pre-filled syringes in a refrigerator between 2 and 8 degrees C (36 and 46 degrees F). Do not freeze. Keep in carton to protect from light. Throw away this medicine if it is left out of the refrigerator for more than 48 hours. Throw away any unused medicine after the expiration date. NOTE: This sheet is a summary. It may not cover all possible information. If you have questions about this medicine, talk to your doctor, pharmacist, or health care provider.    2016, [employer] Standard. (2014-02-21 14:30:14)  

## 2015-07-08 ENCOUNTER — Other Ambulatory Visit (HOSPITAL_BASED_OUTPATIENT_CLINIC_OR_DEPARTMENT_OTHER): Payer: Medicare Other

## 2015-07-08 DIAGNOSIS — C3432 Malignant neoplasm of lower lobe, left bronchus or lung: Secondary | ICD-10-CM

## 2015-07-08 DIAGNOSIS — C3492 Malignant neoplasm of unspecified part of left bronchus or lung: Secondary | ICD-10-CM

## 2015-07-08 LAB — COMPREHENSIVE METABOLIC PANEL
ALT: 13 U/L (ref 0–55)
AST: 16 U/L (ref 5–34)
Albumin: 3.5 g/dL (ref 3.5–5.0)
Alkaline Phosphatase: 98 U/L (ref 40–150)
Anion Gap: 8 mEq/L (ref 3–11)
BUN: 17.1 mg/dL (ref 7.0–26.0)
CALCIUM: 9.2 mg/dL (ref 8.4–10.4)
CHLORIDE: 109 meq/L (ref 98–109)
CO2: 23 meq/L (ref 22–29)
Creatinine: 1 mg/dL (ref 0.7–1.3)
EGFR: >90 mL/min/{1.73_m2} (ref >90)
Glucose: 135 mg/dl (ref 70–140)
POTASSIUM: 4.6 meq/L (ref 3.5–5.1)
SODIUM: 140 meq/L (ref 136–145)
Total Bilirubin: 0.33 mg/dL (ref 0.20–1.20)
Total Protein: 6.8 g/dL (ref 6.4–8.3)

## 2015-07-08 LAB — CBC WITH DIFFERENTIAL/PLATELET
BASO%: 0.6 % (ref 0.0–2.0)
Basophils Absolute: 0 10*3/uL (ref 0.0–0.1)
EOS%: 0.9 % (ref 0.0–7.0)
Eosinophils Absolute: 0 10*3/uL (ref 0.0–0.5)
HEMATOCRIT: 27.5 % — AB (ref 38.4–49.9)
HGB: 9.1 g/dL — ABNORMAL LOW (ref 13.0–17.1)
LYMPH%: 30.1 % (ref 14.0–49.0)
MCH: 22.9 pg — AB (ref 27.2–33.4)
MCHC: 33.1 g/dL (ref 32.0–36.0)
MCV: 69.1 fL — ABNORMAL LOW (ref 79.3–98.0)
MONO#: 0.8 10*3/uL (ref 0.1–0.9)
MONO%: 23.5 % — AB (ref 0.0–14.0)
NEUT%: 44.9 % (ref 39.0–75.0)
NEUTROS ABS: 1.5 10*3/uL (ref 1.5–6.5)
Platelets: 74 10*3/uL — ABNORMAL LOW (ref 140–400)
RBC: 3.98 10*6/uL — AB (ref 4.20–5.82)
RDW: 19 % — ABNORMAL HIGH (ref 11.0–14.6)
WBC: 3.3 10*3/uL — AB (ref 4.0–10.3)
lymph#: 1 10*3/uL (ref 0.9–3.3)
nRBC: 0 % (ref 0–0)

## 2015-07-10 ENCOUNTER — Telehealth: Payer: Self-pay | Admitting: Medical Oncology

## 2015-07-10 DIAGNOSIS — C3492 Malignant neoplasm of unspecified part of left bronchus or lung: Secondary | ICD-10-CM

## 2015-07-10 MED ORDER — HYDROCODONE-ACETAMINOPHEN 5-325 MG PO TABS: 2.0000 | ORAL_TABLET | Freq: Four times a day (QID) | ORAL | Status: DC | PRN

## 2015-07-10 NOTE — Telephone Encounter (Signed)
I told wife rx will e ready for pick up tomorrow.

## 2015-07-15 ENCOUNTER — Other Ambulatory Visit (HOSPITAL_BASED_OUTPATIENT_CLINIC_OR_DEPARTMENT_OTHER): Payer: Medicare Other

## 2015-07-15 DIAGNOSIS — C3492 Malignant neoplasm of unspecified part of left bronchus or lung: Secondary | ICD-10-CM

## 2015-07-15 DIAGNOSIS — C7A1 Malignant poorly differentiated neuroendocrine tumors: Secondary | ICD-10-CM

## 2015-07-15 LAB — CBC WITH DIFFERENTIAL/PLATELET
BASO%: 0.5 % (ref 0.0–2.0)
BASOS ABS: 0.1 10*3/uL (ref 0.0–0.1)
EOS%: 0.3 % (ref 0.0–7.0)
Eosinophils Absolute: 0 10*3/uL (ref 0.0–0.5)
HCT: 30.8 % — ABNORMAL LOW (ref 38.4–49.9)
HGB: 9.7 g/dL — ABNORMAL LOW (ref 13.0–17.1)
LYMPH%: 13.1 % — AB (ref 14.0–49.0)
MCH: 22.6 pg — AB (ref 27.2–33.4)
MCHC: 31.5 g/dL — AB (ref 32.0–36.0)
MCV: 71.7 fL — ABNORMAL LOW (ref 79.3–98.0)
MONO#: 1.1 10*3/uL — ABNORMAL HIGH (ref 0.1–0.9)
MONO%: 8.4 % (ref 0.0–14.0)
NEUT#: 9.8 10*3/uL — ABNORMAL HIGH (ref 1.5–6.5)
NEUT%: 77.7 % — AB (ref 39.0–75.0)
PLATELETS: 155 10*3/uL (ref 140–400)
RBC: 4.29 10*6/uL (ref 4.20–5.82)
RDW: 20.2 % — ABNORMAL HIGH (ref 11.0–14.6)
WBC: 12.6 10*3/uL — AB (ref 4.0–10.3)
lymph#: 1.7 10*3/uL (ref 0.9–3.3)

## 2015-07-15 LAB — COMPREHENSIVE METABOLIC PANEL
ALT: 12 U/L (ref 0–55)
ANION GAP: 7 meq/L (ref 3–11)
AST: 16 U/L (ref 5–34)
Albumin: 3.8 g/dL (ref 3.5–5.0)
Alkaline Phosphatase: 107 U/L (ref 40–150)
BUN: 15.7 mg/dL (ref 7.0–26.0)
CHLORIDE: 110 meq/L — AB (ref 98–109)
CO2: 26 meq/L (ref 22–29)
CREATININE: 1.5 mg/dL — AB (ref 0.7–1.3)
Calcium: 9.2 mg/dL (ref 8.4–10.4)
EGFR: 62 mL/min/{1.73_m2} — ABNORMAL LOW (ref >90)
Glucose: 116 mg/dl (ref 70–140)
POTASSIUM: 4.5 meq/L (ref 3.5–5.1)
Sodium: 142 mEq/L (ref 136–145)
Total Bilirubin: <0.3 mg/dL (ref 0.20–1.20)
Total Protein: 6.9 g/dL (ref 6.4–8.3)

## 2015-07-21 ENCOUNTER — Other Ambulatory Visit (HOSPITAL_BASED_OUTPATIENT_CLINIC_OR_DEPARTMENT_OTHER): Payer: Medicare Other

## 2015-07-21 DIAGNOSIS — C7A1 Malignant poorly differentiated neuroendocrine tumors: Secondary | ICD-10-CM | POA: Diagnosis not present

## 2015-07-21 DIAGNOSIS — C3492 Malignant neoplasm of unspecified part of left bronchus or lung: Secondary | ICD-10-CM

## 2015-07-21 LAB — CBC WITH DIFFERENTIAL/PLATELET
BASO%: 1.3 % (ref 0.0–2.0)
Basophils Absolute: 0.1 10*3/uL (ref 0.0–0.1)
EOS%: 0.1 % (ref 0.0–7.0)
Eosinophils Absolute: 0 10*3/uL (ref 0.0–0.5)
HCT: 31.6 % — ABNORMAL LOW (ref 38.4–49.9)
HGB: 10 g/dL — ABNORMAL LOW (ref 13.0–17.1)
LYMPH%: 16.7 % (ref 14.0–49.0)
MCH: 22.8 pg — ABNORMAL LOW (ref 27.2–33.4)
MCHC: 31.7 g/dL — ABNORMAL LOW (ref 32.0–36.0)
MCV: 71.9 fL — AB (ref 79.3–98.0)
MONO#: 1.1 10*3/uL — ABNORMAL HIGH (ref 0.1–0.9)
MONO%: 11.9 % (ref 0.0–14.0)
NEUT%: 70 % (ref 39.0–75.0)
NEUTROS ABS: 6.5 10*3/uL (ref 1.5–6.5)
Platelets: 214 10*3/uL (ref 140–400)
RBC: 4.39 10*6/uL (ref 4.20–5.82)
RDW: 19.7 % — AB (ref 11.0–14.6)
WBC: 9.4 10*3/uL (ref 4.0–10.3)
lymph#: 1.6 10*3/uL (ref 0.9–3.3)

## 2015-07-21 LAB — COMPREHENSIVE METABOLIC PANEL
ALT: 9 U/L (ref 0–55)
AST: 15 U/L (ref 5–34)
Albumin: 3.9 g/dL (ref 3.5–5.0)
Alkaline Phosphatase: 85 U/L (ref 40–150)
Anion Gap: 6 mEq/L (ref 3–11)
BUN: 17.3 mg/dL (ref 7.0–26.0)
CO2: 25 meq/L (ref 22–29)
Calcium: 9.4 mg/dL (ref 8.4–10.4)
Chloride: 110 mEq/L — ABNORMAL HIGH (ref 98–109)
Creatinine: 1.4 mg/dL — ABNORMAL HIGH (ref 0.7–1.3)
EGFR: 67 mL/min/{1.73_m2} — AB (ref >90)
GLUCOSE: 95 mg/dL (ref 70–140)
Potassium: 4.5 mEq/L (ref 3.5–5.1)
SODIUM: 142 meq/L (ref 136–145)
TOTAL PROTEIN: 7.1 g/dL (ref 6.4–8.3)

## 2015-07-21 LAB — UA PROTEIN, DIPSTICK - CHCC: Protein, ur: NEGATIVE mg/dL

## 2015-07-22 ENCOUNTER — Ambulatory Visit (HOSPITAL_BASED_OUTPATIENT_CLINIC_OR_DEPARTMENT_OTHER): Payer: Medicare Other

## 2015-07-22 ENCOUNTER — Encounter: Payer: Self-pay | Admitting: Internal Medicine

## 2015-07-22 ENCOUNTER — Telehealth: Payer: Self-pay | Admitting: *Deleted

## 2015-07-22 ENCOUNTER — Ambulatory Visit (HOSPITAL_BASED_OUTPATIENT_CLINIC_OR_DEPARTMENT_OTHER): Payer: Medicare Other | Admitting: Internal Medicine

## 2015-07-22 ENCOUNTER — Ambulatory Visit: Payer: Medicare Other | Admitting: Nutrition

## 2015-07-22 ENCOUNTER — Other Ambulatory Visit: Payer: Medicare Other

## 2015-07-22 ENCOUNTER — Telehealth: Payer: Self-pay | Admitting: Internal Medicine

## 2015-07-22 VITALS — BP 111/66 | HR 93 | Temp 98.2°F | Resp 18 | Ht 71.0 in | Wt 169.8 lb

## 2015-07-22 DIAGNOSIS — C3432 Malignant neoplasm of lower lobe, left bronchus or lung: Secondary | ICD-10-CM | POA: Diagnosis present

## 2015-07-22 DIAGNOSIS — Z5111 Encounter for antineoplastic chemotherapy: Secondary | ICD-10-CM

## 2015-07-22 DIAGNOSIS — G4700 Insomnia, unspecified: Secondary | ICD-10-CM

## 2015-07-22 DIAGNOSIS — Z5112 Encounter for antineoplastic immunotherapy: Secondary | ICD-10-CM

## 2015-07-22 DIAGNOSIS — C3492 Malignant neoplasm of unspecified part of left bronchus or lung: Secondary | ICD-10-CM

## 2015-07-22 MED ORDER — ACETAMINOPHEN 325 MG PO TABS
650.0000 mg | ORAL_TABLET | Freq: Once | ORAL | Status: AC
  Administered 2015-07-22: 650 mg via ORAL

## 2015-07-22 MED ORDER — SODIUM CHLORIDE 0.9% FLUSH
10.0000 mL | INTRAVENOUS | Status: DC | PRN
  Administered 2015-07-22: 10 mL
  Filled 2015-07-22: qty 10

## 2015-07-22 MED ORDER — SODIUM CHLORIDE 0.9 % IV SOLN
Freq: Once | INTRAVENOUS | Status: AC
  Administered 2015-07-22: 09:00:00 via INTRAVENOUS

## 2015-07-22 MED ORDER — SODIUM CHLORIDE 0.9 % IV SOLN
10.0000 mg | Freq: Once | INTRAVENOUS | Status: AC
  Administered 2015-07-22: 10 mg via INTRAVENOUS
  Filled 2015-07-22: qty 1

## 2015-07-22 MED ORDER — HEPARIN SOD (PORK) LOCK FLUSH 100 UNIT/ML IV SOLN
500.0000 [IU] | Freq: Once | INTRAVENOUS | Status: AC | PRN
  Administered 2015-07-22: 500 [IU]
  Filled 2015-07-22: qty 5

## 2015-07-22 MED ORDER — SODIUM CHLORIDE 0.9 % IV SOLN
75.0000 mg/m2 | Freq: Once | INTRAVENOUS | Status: AC
  Administered 2015-07-22: 150 mg via INTRAVENOUS
  Filled 2015-07-22: qty 15

## 2015-07-22 MED ORDER — DIPHENHYDRAMINE HCL 50 MG/ML IJ SOLN
50.0000 mg | Freq: Once | INTRAMUSCULAR | Status: AC
  Administered 2015-07-22: 50 mg via INTRAVENOUS

## 2015-07-22 MED ORDER — ACETAMINOPHEN 325 MG PO TABS
ORAL_TABLET | ORAL | Status: AC
  Filled 2015-07-22: qty 2

## 2015-07-22 MED ORDER — DIPHENHYDRAMINE HCL 50 MG/ML IJ SOLN
INTRAMUSCULAR | Status: AC
  Filled 2015-07-22: qty 1

## 2015-07-22 MED ORDER — SODIUM CHLORIDE 0.9 % IV SOLN
10.0000 mg/kg | Freq: Once | INTRAVENOUS | Status: AC
  Administered 2015-07-22: 800 mg via INTRAVENOUS
  Filled 2015-07-22: qty 70

## 2015-07-22 NOTE — Telephone Encounter (Signed)
per pof to sch pt appt-gave pt copy of sch and contrast-adv central sch willc all to sch trmt

## 2015-07-22 NOTE — Progress Notes (Signed)
Nutrition follow-up completed with patient during chemotherapy for non-small cell lung cancer. Patient's weight is stable and was documented as 169.8 pounds June 6 improved slightly from 168.5 pounds May 16. Patient reports his nausea is controlled. He continues to try to eat well and is drinking Ensure Plus. Denies constipation or diarrhea.  Nutrition diagnosis: Unintended weight loss improved.  Intervention: Patient educated to continue medications as prescribed by physician. Provided coupons for Ensure Plus oral nutrition supplements and recommended patient continue. Teach back method was used.  Monitoring, evaluation, goals: Patient will continue to tolerate adequate calories and protein to promote weight maintenance.  Next visit: Tuesday, July 18, during infusion.  **Disclaimer: This note was dictated with voice recognition software. Similar sounding words can inadvertently be transcribed and this note may contain transcription errors which may not have been corrected upon publication of note.**

## 2015-07-22 NOTE — Patient Instructions (Signed)
[facility] Discharge Instructions for Patients Receiving Chemotherapy  Today you received the following chemotherapy agents Cyramza and Taxotere To help prevent nausea and vomiting after your treatment, we encourage you to take your nausea medication as directed   If you develop nausea and vomiting that is not controlled by your nausea medication, call the clinic.   BELOW ARE SYMPTOMS THAT SHOULD BE REPORTED IMMEDIATELY:  *FEVER GREATER THAN 100.5 F  *CHILLS WITH OR WITHOUT FEVER  NAUSEA AND VOMITING THAT IS NOT CONTROLLED WITH YOUR NAUSEA MEDICATION  *UNUSUAL SHORTNESS OF BREATH  *UNUSUAL BRUISING OR BLEEDING  TENDERNESS IN MOUTH AND THROAT WITH OR WITHOUT PRESENCE OF ULCERS  *URINARY PROBLEMS  *BOWEL PROBLEMS  UNUSUAL RASH Items with * indicate a potential emergency and should be followed up as soon as possible.  Feel free to call the clinic you have any questions or concerns. The clinic phone number is [phone].  Please show the CHEMO ALERT CARD at check-in to the Emergency Department and triage nurse.   

## 2015-07-22 NOTE — Progress Notes (Signed)
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  PRIOR THERAPY:  1) Systemic chemotherapy with carboplatin for AUC of 5 given on day 1 and gemcitabine 1000 MG/M2 on days 1 and 8 every 3 weeks. Status post 6 cycles with partial response. 2) Nivolumab 240 MG IV every 2 weeks status post 4 cycles, last dose was given 03/18/2015 discontinued today secondary to disease progression.  CURRENT THERAPY: Systemic chemotherapy with docetaxel 75 MG/M2 and Cyramza 10 MG/KG every 3 weeks, first dose 04/08/2015. Status post 5 cycles.  CODE STATUS: Full code initially but no prolonged resuscitation.   INTERVAL HISTORY: Keith Holloway 55 y.o. male returns to the clinic today for follow-up visit accompanied by his wife. He tolerated cycle #5 of his systemic chemotherapy with docetaxel and Cyramza fairly well. He denied having any significant chest pain, shortness of breath or hemoptysis. The patient denied having any fever or chills. He has no nausea or vomiting. The patient denied having any bleeding issues. He is here today to start cycle # 6 of his treatment.  MEDICAL HISTORY: Past Medical History  Diagnosis Date  . Diabetes mellitus without complication ([place])   . Hypertension   . High cholesterol   . Migraine   . Depression   . Arthritis   . Alcohol abuse   . Polysubstance abuse     alcohol and cocaine  . Polyneuropathy in diabetes(357.2)   . Memory disorder 08/22/2013  . CHF (congestive heart failure) ([place])   . Tobacco abuse counseling 10/09/2014  . Legally blind   . Cancer ([place])     Lung Cancer  . Insomnia 05/20/2015  . Skin infection 06/10/2015    ALLERGIES:  has No Known Allergies.  MEDICATIONS:  Current Outpatient Prescriptions  Medication  Sig Dispense Refill  . aspirin EC 81 MG tablet Take 1 tablet by mouth daily.    . benzonatate (TESSALON) 100 MG capsule TAKE ONE CAPSULE EVERY 8 HOURS 21 capsule 0  . cephALEXin (KEFLEX) 500 MG capsule Take 1 capsule (500 mg total) by mouth 4 (four) times daily. 28 capsule 0  . cholecalciferol (VITAMIN D) 1000 units tablet Take 1,000 Units by mouth daily.    Marland Kitchen dexamethasone (DECADRON) 4 MG tablet 2 tablets by mouth twice a day the day before, day of and day after the chemotherapy every 3 weeks 40 tablet 1  . Ferrous Sulfate (IRON) 325 (65 Fe) MG TABS Take 1 tablet by mouth daily.    Marland Kitchen HYDROcodone-acetaminophen (NORCO/VICODIN) 5-325 MG tablet Take 2 tablets by mouth every 6 (six) hours as needed. 60 tablet 0  . insulin NPH-regular Human (NOVOLIN 70/30) (70-30) 100 UNIT/ML injection Inject 35 Units into the skin 2 (two) times daily with a meal. 10 mL 11  . lidocaine-prilocaine (EMLA) cream Apply 1 application topically as needed. 30 g 0  . losartan-hydrochlorothiazide (HYZAAR) 100-25 MG per tablet Take 1 tablet by mouth daily.  2  . metFORMIN (GLUCOPHAGE) 1000 MG tablet Take 1 tablet (1,000 mg total) by mouth 2 (two) times daily. 60 tablet 12  . ondansetron (ZOFRAN) 8 MG tablet TAKE 1 TABLET EVERY 8 HOURS AS NEEDED FOR NAUSEA AND VOMITING 20 tablet 1  . prochlorperazine (COMPAZINE) 10 MG tablet TAKE 1 TABLET (10 MG TOTAL) BY  MOUTH EVERY 6 (SIX) HOURS AS NEEDED FOR NAUSEA OR VOMITING. 30 tablet 0  . simvastatin (ZOCOR) 40 MG tablet Take 1 tablet (40 mg total) by mouth daily. 30 tablet 3  . sodium polystyrene (KAYEXALATE) 15 GM/60ML suspension Take 120 mLs (30 g total) by mouth once. 120 mL 0  . sulfamethoxazole-trimethoprim (BACTRIM DS,SEPTRA DS) 800-160 MG tablet     . temazepam (RESTORIL) 30 MG capsule TAKE ONE CAPSULE AT BEDTIME 30 capsule 0  . vitamin C (ASCORBIC ACID) 500 MG tablet Take 500 mg by mouth daily.     No current facility-administered medications for this visit.    SURGICAL  HISTORY:  Past Surgical History  Procedure Laterality Date  . Eye surgery  04/2012    detatched retna, bilateral  . Eye surgery  08/2012  . Cataract extraction      bilaterally  . Retinol detachment Bilateral 2014    REVIEW OF SYSTEMS:  A comprehensive review of systems was negative.   PHYSICAL EXAMINATION: General appearance: alert, cooperative, fatigued and no distress Head: Normocephalic, without obvious abnormality, atraumatic Neck: no adenopathy, no JVD, supple, symmetrical, trachea midline and thyroid not enlarged, symmetric, no tenderness/mass/nodules Lymph nodes: Cervical, supraclavicular, and axillary nodes normal. Resp: clear to auscultation bilaterally Back: symmetric, no curvature. ROM normal. No CVA tenderness. Cardio: regular rate and rhythm, S1, S2 normal, no murmur, click, rub or gallop GI: soft, non-tender; bowel sounds normal; no masses,  no organomegaly Extremities: extremities normal, atraumatic, no cyanosis or edema Neurologic: Alert and oriented X 3, normal strength and tone. Normal symmetric reflexes. Normal coordination and gait  ECOG PERFORMANCE STATUS: 1 - Symptomatic but completely ambulatory  There were no vitals taken for this visit.  LABORATORY DATA: Lab Results  Component Value Date   WBC 9.4 07/21/2015   HGB 10.0* 07/21/2015   HCT 31.6* 07/21/2015   MCV 71.9* 07/21/2015   PLT 214 07/21/2015      Chemistry      Component Value Date/Time   NA 142 07/21/2015 0949   NA 142 01/30/2015 1330   K 4.5 07/21/2015 0949   K 4.4 01/30/2015 1330   CL 106 01/30/2015 1330   CO2 25 07/21/2015 0949   CO2 27 01/30/2015 1330   BUN 17.3 07/21/2015 0949   BUN 15 01/30/2015 1330   CREATININE 1.4* 07/21/2015 0949   CREATININE 1.43* 01/30/2015 1330   CREATININE 1.02 01/23/2014 1313      Component Value Date/Time   CALCIUM 9.4 07/21/2015 0949   CALCIUM 9.7 01/30/2015 1330   ALKPHOS 85 07/21/2015 0949   ALKPHOS 108 05/05/2014 2049   AST 15 07/21/2015  0949   AST 20 05/05/2014 2049   ALT 9 07/21/2015 0949   ALT 24 05/05/2014 2049   BILITOT <0.30 07/21/2015 0949   BILITOT 0.5 05/05/2014 2049       RADIOGRAPHIC STUDIES: No results found.  ASSESSMENT AND PLAN: This is a very pleasant 55 years old African-American male with a stage IV non-small cell lung cancer, squamous cell carcinoma who completed systemic chemotherapy with carboplatin and gemcitabine status post 6 cycles. The patient has tolerated his treatment fairly well with no significant adverse effects except for mild fatigue and nausea. He had evidence for disease progression and the patient was started on treatment with immunotherapy with Nivolumab currently status post 4 cycles. Unfortunately the recent CT scan of the chest, abdomen and pelvis showed interval progression of his disease with increase in the size of the dominant anterior mediastinal mass and  left lower lobe perihilar mass. There was also development of new subcarinal adenopathy and multiple new pulmonary nodules as well as questionable liver lesion. He is currently undergoing systemic chemotherapy with docetaxel and Cyramza status post 5 cycles. He is tolerating his treatment fairly well. I recommended for him to continue on the current chemotherapy and he will proceed with cycle #6 today. For insomnia, he will continue on Restoril 30 mg by mouth daily at bedtime when necessary. He would come back for follow-up visit in 3 weeks for reevaluation before starting cycle #7 after repeating CT scan of the chest, abdomen and pelvis for restaging of his disease. He was advised to call immediately if he has any concerning symptoms in the interval. The patient voices understanding of current disease status and treatment options and is in agreement with the current care plan.  All questions were answered. The patient knows to call the clinic with any problems, questions or concerns. We can certainly see the patient much sooner if  necessary.  Disclaimer: This note was dictated with voice recognition software. Similar sounding words can inadvertently be transcribed and may not be corrected upon review.

## 2015-07-22 NOTE — Telephone Encounter (Signed)
Open by mistake

## 2015-07-24 ENCOUNTER — Ambulatory Visit (HOSPITAL_BASED_OUTPATIENT_CLINIC_OR_DEPARTMENT_OTHER): Payer: Medicare Other

## 2015-07-24 VITALS — BP 131/72 | HR 99 | Temp 97.8°F | Resp 18

## 2015-07-24 DIAGNOSIS — C3492 Malignant neoplasm of unspecified part of left bronchus or lung: Secondary | ICD-10-CM

## 2015-07-24 DIAGNOSIS — C3432 Malignant neoplasm of lower lobe, left bronchus or lung: Secondary | ICD-10-CM

## 2015-07-24 MED ORDER — PEGFILGRASTIM INJECTION 6 MG/0.6ML ~~LOC~~
6.0000 mg | PREFILLED_SYRINGE | Freq: Once | SUBCUTANEOUS | Status: AC
  Administered 2015-07-24: 6 mg via SUBCUTANEOUS
  Filled 2015-07-24: qty 0.6

## 2015-07-24 NOTE — Patient Instructions (Signed)
Pegfilgrastim injection What is this medicine? PEGFILGRASTIM (PEG fil gra stim) is a long-acting granulocyte colony-stimulating factor that stimulates the growth of neutrophils, a type of white blood cell important in the body's fight against infection. It is used to reduce the incidence of fever and infection in patients with certain types of cancer who are receiving chemotherapy that affects the bone marrow, and to increase survival after being exposed to high doses of radiation. This medicine may be used for other purposes; ask your health care provider or pharmacist if you have questions. What should I tell my health care provider before I take this medicine? They need to know if you have any of these conditions: -kidney disease -latex allergy -ongoing radiation therapy -sickle cell disease -skin reactions to acrylic adhesives (On-Body Injector only) -an unusual or allergic reaction to pegfilgrastim, filgrastim, other medicines, foods, dyes, or preservatives -pregnant or trying to get pregnant -breast-feeding How should I use this medicine? This medicine is for injection under the skin. If you get this medicine at home, you will be taught how to prepare and give the pre-filled syringe or how to use the On-body Injector. Refer to the patient Instructions for Use for detailed instructions. Use exactly as directed. Take your medicine at regular intervals. Do not take your medicine more often than directed. It is important that you put your used needles and syringes in a special sharps container. Do not put them in a trash can. If you do not have a sharps container, call your pharmacist or healthcare provider to get one. Talk to your pediatrician regarding the use of this medicine in children. While this drug may be prescribed for selected conditions, precautions do apply. Overdosage: If you think you have taken too much of this medicine contact a poison control center or emergency room at  once. NOTE: This medicine is only for you. Do not share this medicine with others. What if I miss a dose? It is important not to miss your dose. Call your doctor or health care professional if you miss your dose. If you miss a dose due to an On-body Injector failure or leakage, a new dose should be administered as soon as possible using a single prefilled syringe for manual use. What may interact with this medicine? Interactions have not been studied. Give your health care provider a list of all the medicines, herbs, non-prescription drugs, or dietary supplements you use. Also tell them if you smoke, drink alcohol, or use illegal drugs. Some items may interact with your medicine. This list may not describe all possible interactions. Give your health care provider a list of all the medicines, herbs, non-prescription drugs, or dietary supplements you use. Also tell them if you smoke, drink alcohol, or use illegal drugs. Some items may interact with your medicine. What should I watch for while using this medicine? You may need blood work done while you are taking this medicine. If you are going to need a MRI, CT scan, or other procedure, tell your doctor that you are using this medicine (On-Body Injector only). What side effects may I notice from receiving this medicine? Side effects that you should report to your doctor or health care professional as soon as possible: -allergic reactions like skin rash, itching or hives, swelling of the face, lips, or tongue -dizziness -fever -pain, redness, or irritation at site where injected -pinpoint red spots on the skin -red or dark-brown urine -shortness of breath or breathing problems -stomach or side pain, or pain   at the shoulder -swelling -tiredness -trouble passing urine or change in the amount of urine Side effects that usually do not require medical attention (report to your doctor or health care professional if they continue or are  bothersome): -bone pain -muscle pain This list may not describe all possible side effects. Call your doctor for medical advice about side effects. You may report side effects to FDA at 1-800-FDA-1088. Where should I keep my medicine? Keep out of the reach of children. Store pre-filled syringes in a refrigerator between 2 and 8 degrees C (36 and 46 degrees F). Do not freeze. Keep in carton to protect from light. Throw away this medicine if it is left out of the refrigerator for more than 48 hours. Throw away any unused medicine after the expiration date. NOTE: This sheet is a summary. It may not cover all possible information. If you have questions about this medicine, talk to your doctor, pharmacist, or health care provider.    2016, [employer] Standard. (2014-02-21 14:30:14)  

## 2015-07-29 ENCOUNTER — Other Ambulatory Visit (HOSPITAL_BASED_OUTPATIENT_CLINIC_OR_DEPARTMENT_OTHER): Payer: Medicare Other

## 2015-07-29 DIAGNOSIS — C3432 Malignant neoplasm of lower lobe, left bronchus or lung: Secondary | ICD-10-CM

## 2015-07-29 DIAGNOSIS — C3492 Malignant neoplasm of unspecified part of left bronchus or lung: Secondary | ICD-10-CM

## 2015-07-29 LAB — COMPREHENSIVE METABOLIC PANEL
ALBUMIN: 3.5 g/dL (ref 3.5–5.0)
ALK PHOS: 92 U/L (ref 40–150)
ALT: 15 U/L (ref 0–55)
AST: 16 U/L (ref 5–34)
Anion Gap: 6 mEq/L (ref 3–11)
BILIRUBIN TOTAL: 0.35 mg/dL (ref 0.20–1.20)
BUN: 20.6 mg/dL (ref 7.0–26.0)
CO2: 26 mEq/L (ref 22–29)
Calcium: 9.6 mg/dL (ref 8.4–10.4)
Chloride: 111 mEq/L — ABNORMAL HIGH (ref 98–109)
Creatinine: 1.3 mg/dL (ref 0.7–1.3)
EGFR: 74 mL/min/{1.73_m2} — ABNORMAL LOW (ref >90)
GLUCOSE: 135 mg/dL (ref 70–140)
Potassium: 4.8 mEq/L (ref 3.5–5.1)
SODIUM: 143 meq/L (ref 136–145)
TOTAL PROTEIN: 6.9 g/dL (ref 6.4–8.3)

## 2015-07-29 LAB — CBC WITH DIFFERENTIAL/PLATELET
BASO%: 0.4 % (ref 0.0–2.0)
BASOS ABS: 0 10*3/uL (ref 0.0–0.1)
EOS%: 0.2 % (ref 0.0–7.0)
Eosinophils Absolute: 0 10*3/uL (ref 0.0–0.5)
HCT: 27.8 % — ABNORMAL LOW (ref 38.4–49.9)
HEMOGLOBIN: 9.1 g/dL — AB (ref 13.0–17.1)
LYMPH#: 1.1 10*3/uL (ref 0.9–3.3)
LYMPH%: 23.8 % (ref 14.0–49.0)
MCH: 23.1 pg — ABNORMAL LOW (ref 27.2–33.4)
MCHC: 32.7 g/dL (ref 32.0–36.0)
MCV: 70.6 fL — AB (ref 79.3–98.0)
MONO#: 1.3 10*3/uL — ABNORMAL HIGH (ref 0.1–0.9)
MONO%: 26.6 % — ABNORMAL HIGH (ref 0.0–14.0)
NEUT#: 2.3 10*3/uL (ref 1.5–6.5)
NEUT%: 49 % (ref 39.0–75.0)
NRBC: 0 % (ref 0–0)
Platelets: 81 10*3/uL — ABNORMAL LOW (ref 140–400)
RBC: 3.94 10*6/uL — ABNORMAL LOW (ref 4.20–5.82)
RDW: 18 % — AB (ref 11.0–14.6)
WBC: 4.7 10*3/uL (ref 4.0–10.3)

## 2015-08-05 ENCOUNTER — Other Ambulatory Visit (HOSPITAL_BASED_OUTPATIENT_CLINIC_OR_DEPARTMENT_OTHER): Payer: Medicare Other

## 2015-08-05 DIAGNOSIS — C3432 Malignant neoplasm of lower lobe, left bronchus or lung: Secondary | ICD-10-CM | POA: Diagnosis present

## 2015-08-05 DIAGNOSIS — C3492 Malignant neoplasm of unspecified part of left bronchus or lung: Secondary | ICD-10-CM

## 2015-08-05 LAB — COMPREHENSIVE METABOLIC PANEL
ALT: 12 U/L (ref 0–55)
ANION GAP: 8 meq/L (ref 3–11)
AST: 17 U/L (ref 5–34)
Albumin: 3.7 g/dL (ref 3.5–5.0)
Alkaline Phosphatase: 125 U/L (ref 40–150)
BUN: 14.9 mg/dL (ref 7.0–26.0)
CO2: 26 meq/L (ref 22–29)
CREATININE: 1.2 mg/dL (ref 0.7–1.3)
Calcium: 9.2 mg/dL (ref 8.4–10.4)
Chloride: 107 mEq/L (ref 98–109)
EGFR: 79 mL/min/{1.73_m2} — ABNORMAL LOW (ref >90)
GLUCOSE: 141 mg/dL — AB (ref 70–140)
Potassium: 4.6 mEq/L (ref 3.5–5.1)
SODIUM: 140 meq/L (ref 136–145)
TOTAL PROTEIN: 7.2 g/dL (ref 6.4–8.3)

## 2015-08-05 LAB — CBC WITH DIFFERENTIAL/PLATELET
BASO%: 0.4 % (ref 0.0–2.0)
Basophils Absolute: 0.1 10*3/uL (ref 0.0–0.1)
EOS%: 0.1 % (ref 0.0–7.0)
Eosinophils Absolute: 0 10*3/uL (ref 0.0–0.5)
HCT: 32.8 % — ABNORMAL LOW (ref 38.4–49.9)
HGB: 10.3 g/dL — ABNORMAL LOW (ref 13.0–17.1)
LYMPH%: 12.2 % — AB (ref 14.0–49.0)
MCH: 22.7 pg — ABNORMAL LOW (ref 27.2–33.4)
MCHC: 31.3 g/dL — AB (ref 32.0–36.0)
MCV: 72.6 fL — ABNORMAL LOW (ref 79.3–98.0)
MONO#: 0.9 10*3/uL (ref 0.1–0.9)
MONO%: 6 % (ref 0.0–14.0)
NEUT%: 81.3 % — AB (ref 39.0–75.0)
NEUTROS ABS: 11.9 10*3/uL — AB (ref 1.5–6.5)
PLATELETS: 170 10*3/uL (ref 140–400)
RBC: 4.51 10*6/uL (ref 4.20–5.82)
RDW: 18.7 % — ABNORMAL HIGH (ref 11.0–14.6)
WBC: 14.7 10*3/uL — AB (ref 4.0–10.3)
lymph#: 1.8 10*3/uL (ref 0.9–3.3)

## 2015-08-06 ENCOUNTER — Other Ambulatory Visit: Payer: Self-pay | Admitting: Medical Oncology

## 2015-08-06 DIAGNOSIS — C3492 Malignant neoplasm of unspecified part of left bronchus or lung: Secondary | ICD-10-CM

## 2015-08-06 MED ORDER — HYDROCODONE-ACETAMINOPHEN 5-325 MG PO TABS: 2.0000 | ORAL_TABLET | Freq: Four times a day (QID) | ORAL | Status: DC | PRN

## 2015-08-06 NOTE — Progress Notes (Signed)
Wife notified rx ready for pick up

## 2015-08-07 ENCOUNTER — Encounter (HOSPITAL_COMMUNITY): Payer: Self-pay

## 2015-08-07 ENCOUNTER — Ambulatory Visit (HOSPITAL_COMMUNITY)
Admission: RE | Admit: 2015-08-07 | Discharge: 2015-08-07 | Disposition: A | Discharge status: Home / Self Care | Payer: Medicare Other | Source: Ambulatory Visit | Attending: Internal Medicine | Admitting: Internal Medicine

## 2015-08-07 DIAGNOSIS — C3492 Malignant neoplasm of unspecified part of left bronchus or lung: Secondary | ICD-10-CM | POA: Diagnosis not present

## 2015-08-07 DIAGNOSIS — Z9889 Other specified postprocedural states: Secondary | ICD-10-CM | POA: Diagnosis not present

## 2015-08-07 DIAGNOSIS — I700 Atherosclerosis of aorta: Secondary | ICD-10-CM | POA: Insufficient documentation

## 2015-08-07 DIAGNOSIS — R932 Abnormal findings on diagnostic imaging of liver and biliary tract: Secondary | ICD-10-CM | POA: Insufficient documentation

## 2015-08-07 DIAGNOSIS — J90 Pleural effusion, not elsewhere classified: Secondary | ICD-10-CM | POA: Insufficient documentation

## 2015-08-07 DIAGNOSIS — R918 Other nonspecific abnormal finding of lung field: Secondary | ICD-10-CM | POA: Insufficient documentation

## 2015-08-07 MED ORDER — IOPAMIDOL (ISOVUE-300) INJECTION 61%
100.0000 mL | Freq: Once | INTRAVENOUS | Status: AC | PRN
  Administered 2015-08-07: 100 mL via INTRAVENOUS

## 2015-08-11 ENCOUNTER — Other Ambulatory Visit: Payer: Self-pay | Admitting: Medical Oncology

## 2015-08-11 ENCOUNTER — Telehealth: Payer: Self-pay | Admitting: Medical Oncology

## 2015-08-11 ENCOUNTER — Telehealth: Payer: Self-pay | Admitting: Internal Medicine

## 2015-08-11 ENCOUNTER — Ambulatory Visit (HOSPITAL_BASED_OUTPATIENT_CLINIC_OR_DEPARTMENT_OTHER): Payer: Medicare Other | Admitting: Internal Medicine

## 2015-08-11 ENCOUNTER — Other Ambulatory Visit (HOSPITAL_BASED_OUTPATIENT_CLINIC_OR_DEPARTMENT_OTHER): Payer: Medicare Other

## 2015-08-11 ENCOUNTER — Encounter: Payer: Self-pay | Admitting: Internal Medicine

## 2015-08-11 ENCOUNTER — Ambulatory Visit: Payer: Medicare Other

## 2015-08-11 VITALS — BP 159/84 | HR 104 | Temp 98.4°F | Resp 19 | Ht 71.0 in | Wt 172.8 lb

## 2015-08-11 DIAGNOSIS — C3492 Malignant neoplasm of unspecified part of left bronchus or lung: Secondary | ICD-10-CM

## 2015-08-11 DIAGNOSIS — Z79899 Other long term (current) drug therapy: Secondary | ICD-10-CM | POA: Diagnosis not present

## 2015-08-11 DIAGNOSIS — Z5111 Encounter for antineoplastic chemotherapy: Secondary | ICD-10-CM

## 2015-08-11 DIAGNOSIS — C3432 Malignant neoplasm of lower lobe, left bronchus or lung: Secondary | ICD-10-CM

## 2015-08-11 LAB — CBC WITH DIFFERENTIAL/PLATELET
BASO%: 1 % (ref 0.0–2.0)
BASOS ABS: 0.2 10*3/uL — AB (ref 0.0–0.1)
EOS ABS: 0 10*3/uL (ref 0.0–0.5)
EOS%: 0 % (ref 0.0–7.0)
HCT: 31.7 % — ABNORMAL LOW (ref 38.4–49.9)
HGB: 9.9 g/dL — ABNORMAL LOW (ref 13.0–17.1)
LYMPH%: 6.4 % — ABNORMAL LOW (ref 14.0–49.0)
MCH: 22.5 pg — AB (ref 27.2–33.4)
MCHC: 31.3 g/dL — AB (ref 32.0–36.0)
MCV: 72 fL — ABNORMAL LOW (ref 79.3–98.0)
MONO#: 1.8 10*3/uL — AB (ref 0.1–0.9)
MONO%: 10 % (ref 0.0–14.0)
NEUT%: 82.6 % — AB (ref 39.0–75.0)
NEUTROS ABS: 14.7 10*3/uL — AB (ref 1.5–6.5)
PLATELETS: 252 10*3/uL (ref 140–400)
RBC: 4.41 10*6/uL (ref 4.20–5.82)
RDW: 18 % — ABNORMAL HIGH (ref 11.0–14.6)
WBC: 17.7 10*3/uL — AB (ref 4.0–10.3)
lymph#: 1.1 10*3/uL (ref 0.9–3.3)

## 2015-08-11 LAB — COMPREHENSIVE METABOLIC PANEL
ALT: 12 U/L (ref 0–55)
ANION GAP: 13 meq/L — AB (ref 3–11)
AST: 19 U/L (ref 5–34)
Albumin: 3.9 g/dL (ref 3.5–5.0)
Alkaline Phosphatase: 117 U/L (ref 40–150)
BUN: 16.9 mg/dL (ref 7.0–26.0)
CHLORIDE: 108 meq/L (ref 98–109)
CO2: 19 meq/L — AB (ref 22–29)
Calcium: 9.8 mg/dL (ref 8.4–10.4)
Creatinine: 1.6 mg/dL — ABNORMAL HIGH (ref 0.7–1.3)
EGFR: 56 mL/min/{1.73_m2} — AB (ref >90)
GLUCOSE: 97 mg/dL (ref 70–140)
POTASSIUM: 5.4 meq/L — AB (ref 3.5–5.1)
SODIUM: 140 meq/L (ref 136–145)
Total Protein: 7.6 g/dL (ref 6.4–8.3)

## 2015-08-11 NOTE — Progress Notes (Signed)
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  PRIOR THERAPY:  1) Systemic chemotherapy with carboplatin for AUC of 5 given on day 1 and gemcitabine 1000 MG/M2 on days 1 and 8 every 3 weeks. Status post 6 cycles with partial response. 2) Nivolumab 240 MG IV every 2 weeks status post 4 cycles, last dose was given 03/18/2015 discontinued today secondary to disease progression.  CURRENT THERAPY: Systemic chemotherapy with docetaxel 75 MG/M2 and Cyramza 10 MG/KG every 3 weeks, first dose 04/08/2015. Status post 6 cycles.  CODE STATUS: Full code initially but no prolonged resuscitation.   INTERVAL HISTORY: Keith Holloway 55 y.o. male returns to the clinic today for follow-up visit accompanied by his wife. He was supposed to come in earlier today for his evaluation and treatment but unfortunately the patient was out of his house for the last 2 days and his wife was concerned about heavy drinking and doing drugs. She was finally able to find him and she brought him to the clinic for further evaluation. He tolerated cycle #6 of his systemic chemotherapy with docetaxel and Cyramza fairly well. He denied having any significant chest pain, shortness of breath or hemoptysis. The patient denied having any fever or chills. He has no nausea or vomiting. The patient denied having any bleeding issues. He had repeat CT scan of the chest, abdomen and pelvis performed recently and he is here for evaluation and discussion of his scan results.  MEDICAL HISTORY: Past Medical History  Diagnosis Date  . Diabetes mellitus without complication ([place])   . Hypertension   . High cholesterol   . Migraine   . Depression   . Arthritis   . Alcohol abuse   . Polysubstance  abuse     alcohol and cocaine  . Polyneuropathy in diabetes(357.2)   . Memory disorder 08/22/2013  . CHF (congestive heart failure) ([place])   . Tobacco abuse counseling 10/09/2014  . Legally blind   . Insomnia 05/20/2015  . Skin infection 06/10/2015  . Cancer ([place]) dx'd 04/2014    Lung Cancer    ALLERGIES:  has No Known Allergies.  MEDICATIONS:  Current Outpatient Prescriptions  Medication Sig Dispense Refill  . aspirin EC 81 MG tablet Take 1 tablet by mouth daily.    . benzonatate (TESSALON) 100 MG capsule TAKE ONE CAPSULE EVERY 8 HOURS 21 capsule 0  . cholecalciferol (VITAMIN D) 1000 units tablet Take 1,000 Units by mouth daily.    Marland Kitchen dexamethasone (DECADRON) 4 MG tablet 2 tablets by mouth twice a day the day before, day of and day after the chemotherapy every 3 weeks 40 tablet 1  . Ferrous Sulfate (IRON) 325 (65 Fe) MG TABS Take 1 tablet by mouth daily.    Marland Kitchen HYDROcodone-acetaminophen (NORCO/VICODIN) 5-325 MG tablet Take 2 tablets by mouth every 6 (six) hours as needed. 60 tablet 0  . insulin NPH-regular Human (NOVOLIN 70/30) (70-30) 100 UNIT/ML injection Inject 35 Units into the skin 2 (two) times daily with a meal. 10 mL 11  . lidocaine-prilocaine (EMLA) cream Apply 1 application topically as needed. 30 g 0  . losartan-hydrochlorothiazide (HYZAAR) 100-25 MG per tablet Take 1 tablet by mouth daily.  2  . metFORMIN (GLUCOPHAGE) 1000 MG tablet Take 1 tablet (1,000  mg total) by mouth 2 (two) times daily. 60 tablet 12  . ondansetron (ZOFRAN) 8 MG tablet TAKE 1 TABLET EVERY 8 HOURS AS NEEDED FOR NAUSEA AND VOMITING 20 tablet 1  . simvastatin (ZOCOR) 40 MG tablet Take 1 tablet (40 mg total) by mouth daily. 30 tablet 3  . sodium polystyrene (KAYEXALATE) 15 GM/60ML suspension Take 120 mLs (30 g total) by mouth once. 120 mL 0  . sulfamethoxazole-trimethoprim (BACTRIM DS,SEPTRA DS) 800-160 MG tablet     . vitamin C (ASCORBIC ACID) 500 MG tablet Take 500 mg by mouth daily.    . prochlorperazine  (COMPAZINE) 10 MG tablet TAKE 1 TABLET (10 MG TOTAL) BY MOUTH EVERY 6 (SIX) HOURS AS NEEDED FOR NAUSEA OR VOMITING. (Patient not taking: Reported on 08/11/2015) 30 tablet 0  . temazepam (RESTORIL) 30 MG capsule TAKE ONE CAPSULE AT BEDTIME (Patient not taking: Reported on 08/11/2015) 30 capsule 0   No current facility-administered medications for this visit.    SURGICAL HISTORY:  Past Surgical History  Procedure Laterality Date  . Eye surgery  04/2012    detatched retna, bilateral  . Eye surgery  08/2012  . Cataract extraction      bilaterally  . Retinol detachment Bilateral 2014    REVIEW OF SYSTEMS:  Constitutional: negative Eyes: negative Ears, nose, mouth, throat, and face: negative Respiratory: positive for dyspnea on exertion Cardiovascular: negative Gastrointestinal: negative Genitourinary:negative Integument/breast: negative Hematologic/lymphatic: negative Musculoskeletal:negative Neurological: negative Behavioral/Psych: negative Endocrine: negative Allergic/Immunologic: negative   PHYSICAL EXAMINATION: General appearance: alert, cooperative, fatigued and no distress Head: Normocephalic, without obvious abnormality, atraumatic Neck: no adenopathy, no JVD, supple, symmetrical, trachea midline and thyroid not enlarged, symmetric, no tenderness/mass/nodules Lymph nodes: Cervical, supraclavicular, and axillary nodes normal. Resp: clear to auscultation bilaterally Back: symmetric, no curvature. ROM normal. No CVA tenderness. Cardio: regular rate and rhythm, S1, S2 normal, no murmur, click, rub or gallop GI: soft, non-tender; bowel sounds normal; no masses,  no organomegaly Extremities: extremities normal, atraumatic, no cyanosis or edema Neurologic: Alert and oriented X 3, normal strength and tone. Normal symmetric reflexes. Normal coordination and gait  ECOG PERFORMANCE STATUS: 1 - Symptomatic but completely ambulatory  Blood pressure 159/84, pulse 104, temperature 98.4  F (36.9 C), temperature source Oral, resp. rate 19, height '5\' 11"'$  (1.803 m), weight 172 lb 12.8 oz (78.382 kg), SpO2 99 %.  LABORATORY DATA: Lab Results  Component Value Date   WBC 17.7* 08/11/2015   HGB 9.9* 08/11/2015   HCT 31.7* 08/11/2015   MCV 72.0* 08/11/2015   PLT 252 08/11/2015      Chemistry      Component Value Date/Time   NA 140 08/11/2015 1525   NA 142 01/30/2015 1330   K 5.4* 08/11/2015 1525   K 4.4 01/30/2015 1330   CL 106 01/30/2015 1330   CO2 19* 08/11/2015 1525   CO2 27 01/30/2015 1330   BUN 16.9 08/11/2015 1525   BUN 15 01/30/2015 1330   CREATININE 1.6* 08/11/2015 1525   CREATININE 1.43* 01/30/2015 1330   CREATININE 1.02 01/23/2014 1313      Component Value Date/Time   CALCIUM 9.8 08/11/2015 1525   CALCIUM 9.7 01/30/2015 1330   ALKPHOS 117 08/11/2015 1525   ALKPHOS 108 05/05/2014 2049   AST 19 08/11/2015 1525   AST 20 05/05/2014 2049   ALT 12 08/11/2015 1525   ALT 24 05/05/2014 2049   BILITOT <0.30 08/11/2015 1525   BILITOT 0.5 05/05/2014 2049       RADIOGRAPHIC STUDIES:  Ct Chest W Contrast  08/07/2015  CLINICAL DATA:  Stage IV non-small-cell lung cancer. EXAM: CT CHEST, ABDOMEN, AND PELVIS WITH CONTRAST TECHNIQUE: Multidetector CT imaging of the chest, abdomen and pelvis was performed following the standard protocol during bolus administration of intravenous contrast. CONTRAST:  127m ISOVUE-300 IOPAMIDOL (ISOVUE-300) INJECTION 61% COMPARISON:  06/09/2015. FINDINGS: CT CHEST FINDINGS Mediastinum/Lymph Nodes: No supraclavicular adenopathy. A right Port-A-Cath which terminates at the high right atrium. Normal heart size. Minimal anterior pericardial fluid is favored to be physiologic. No central pulmonary embolism, on this non-dedicated study. No middle mediastinal adenopathy. Anterior mediastinal mass measures on the order of 6.3 x 3.7 cm on image 24/series 2. Compare 6.4 x 4.0 cm previously (when remeasured). Lungs/Pleura: Decrease in trace left  pleural fluid. Minimal right pleural thickening. Lower lobe predominant bronchial wall thickening. Left base scarring. Left upper lobe nodularity along the peribronchovascular interstitium is decreased. Example at 13 mm on image 54/series 4 versus 2.2 cm previously. Central left lower lobe lung lesion measures on the order of 2.5 x 1.9 cm on image 38/ series 2 today. Compare 3.3 x 3.1 cm on the prior exam (when remeasured). There are scattered smaller pulmonary nodules bilaterally which are felt to be similar, given differences in slice thickness. Example at 3 mm in the right apex on image 52/series 4. Relatively ill-defined right upper lobe 4 mm nodule on image 75/series 4. Musculoskeletal: No acute osseous abnormality. CT ABDOMEN PELVIS FINDINGS Hepatobiliary: Right hepatic lobe subcapsular lesion measures 1.7 cm on image 52/series 2 versus 2.2 cm previously. More inferior right hepatic lobe 7 mm lesion on image 67/series 2 measured 11 mm previously (when remeasured). No new liver lesions. Normal gallbladder, without biliary ductal dilatation. Pancreas: Pancreatic atrophy, without duct dilatation or dominant mass. Spleen: Normal in size, without focal abnormality. Adrenals/Urinary Tract: Normal adrenal glands. Normal kidneys, without hydronephrosis. Normal urinary bladder. Stomach/Bowel: Normal stomach, without wall thickening. Normal caliber of bowel loops. Vascular/Lymphatic: Aortic and branch vessel atherosclerosis. No abdominopelvic adenopathy. Reproductive: Normal prostate. Other: No significant free fluid. No evidence of omental or peritoneal disease. Musculoskeletal: No acute osseous abnormality. IMPRESSION: CT CHEST IMPRESSION 1. Similar size of anterior mediastinal mass/adenopathy. 2. Decrease in central left lower lobe and left upper lobe lung lesions. Other scattered pulmonary nodules are tiny and felt to be similar. 3.  Aortic atherosclerosis. 4. Decrease in tiny left pleural effusion. CT ABDOMEN AND  PELVIS IMPRESSION 1. Response to therapy of hepatic metastasis. 2. No new sites of metastatic disease within the abdomen or pelvis. Electronically Signed   By: KAbigail MiyamotoM.D.   On: 08/07/2015 10:30   Ct Abdomen Pelvis W Contrast  08/07/2015  CLINICAL DATA:  Stage IV non-small-cell lung cancer. EXAM: CT CHEST, ABDOMEN, AND PELVIS WITH CONTRAST TECHNIQUE: Multidetector CT imaging of the chest, abdomen and pelvis was performed following the standard protocol during bolus administration of intravenous contrast. CONTRAST:  1032mISOVUE-300 IOPAMIDOL (ISOVUE-300) INJECTION 61% COMPARISON:  06/09/2015. FINDINGS: CT CHEST FINDINGS Mediastinum/Lymph Nodes: No supraclavicular adenopathy. A right Port-A-Cath which terminates at the high right atrium. Normal heart size. Minimal anterior pericardial fluid is favored to be physiologic. No central pulmonary embolism, on this non-dedicated study. No middle mediastinal adenopathy. Anterior mediastinal mass measures on the order of 6.3 x 3.7 cm on image 24/series 2. Compare 6.4 x 4.0 cm previously (when remeasured). Lungs/Pleura: Decrease in trace left pleural fluid. Minimal right pleural thickening. Lower lobe predominant bronchial wall thickening. Left base scarring. Left upper lobe nodularity along the peribronchovascular  interstitium is decreased. Example at 13 mm on image 54/series 4 versus 2.2 cm previously. Central left lower lobe lung lesion measures on the order of 2.5 x 1.9 cm on image 38/ series 2 today. Compare 3.3 x 3.1 cm on the prior exam (when remeasured). There are scattered smaller pulmonary nodules bilaterally which are felt to be similar, given differences in slice thickness. Example at 3 mm in the right apex on image 52/series 4. Relatively ill-defined right upper lobe 4 mm nodule on image 75/series 4. Musculoskeletal: No acute osseous abnormality. CT ABDOMEN PELVIS FINDINGS Hepatobiliary: Right hepatic lobe subcapsular lesion measures 1.7 cm on image  52/series 2 versus 2.2 cm previously. More inferior right hepatic lobe 7 mm lesion on image 67/series 2 measured 11 mm previously (when remeasured). No new liver lesions. Normal gallbladder, without biliary ductal dilatation. Pancreas: Pancreatic atrophy, without duct dilatation or dominant mass. Spleen: Normal in size, without focal abnormality. Adrenals/Urinary Tract: Normal adrenal glands. Normal kidneys, without hydronephrosis. Normal urinary bladder. Stomach/Bowel: Normal stomach, without wall thickening. Normal caliber of bowel loops. Vascular/Lymphatic: Aortic and branch vessel atherosclerosis. No abdominopelvic adenopathy. Reproductive: Normal prostate. Other: No significant free fluid. No evidence of omental or peritoneal disease. Musculoskeletal: No acute osseous abnormality. IMPRESSION: CT CHEST IMPRESSION 1. Similar size of anterior mediastinal mass/adenopathy. 2. Decrease in central left lower lobe and left upper lobe lung lesions. Other scattered pulmonary nodules are tiny and felt to be similar. 3.  Aortic atherosclerosis. 4. Decrease in tiny left pleural effusion. CT ABDOMEN AND PELVIS IMPRESSION 1. Response to therapy of hepatic metastasis. 2. No new sites of metastatic disease within the abdomen or pelvis. Electronically Signed   By: Abigail Miyamoto M.D.   On: 08/07/2015 10:30    ASSESSMENT AND PLAN: This is a very pleasant 55 years old African-American male with a stage IV non-small cell lung cancer, squamous cell carcinoma who completed systemic chemotherapy with carboplatin and gemcitabine status post 6 cycles. The patient has tolerated his treatment fairly well with no significant adverse effects except for mild fatigue and nausea. He had evidence for disease progression and the patient was started on treatment with immunotherapy with Nivolumab currently status post 4 cycles. Unfortunately the recent CT scan of the chest, abdomen and pelvis showed interval progression of his disease with  increase in the size of the dominant anterior mediastinal mass and left lower lobe perihilar mass. There was also development of new subcarinal adenopathy and multiple new pulmonary nodules as well as questionable liver lesion. He is currently undergoing systemic chemotherapy with docetaxel and Cyramza status post 6 cycles. He is tolerating his treatment fairly well. The recent CT scan of the chest, abdomen and pelvis showed further improvement of his disease. I discussed the scan results with the patient and his wife. I recommended for him to continue on the current chemotherapy and he will proceed with cycle #7 tomorrow. For insomnia, he will continue on Restoril 30 mg by mouth daily at bedtime when necessary. He would come back for follow-up visit in 3 weeks for reevaluation before starting cycle #8. I strongly encouraged the patient and counselled him about quitting alcohol and drug abuse. He was advised to call immediately if he has any concerning symptoms in the interval. The patient voices understanding of current disease status and treatment options and is in agreement with the current care plan.  All questions were answered. The patient knows to call the clinic with any problems, questions or concerns. We can certainly see the  patient much sooner if necessary.  Disclaimer: This note was dictated with voice recognition software. Similar sounding words can inadvertently be transcribed and may not be corrected upon review.

## 2015-08-11 NOTE — Telephone Encounter (Signed)
Wife called and stated they will be here at 3 pm

## 2015-08-11 NOTE — Telephone Encounter (Signed)
I told his wife Keith Holloway wants to see him today or wed afternoon , late. I gave wife this information. She said [name] is not at home and he did not come home last night. She thinks he is back on street doing drugs inlcuding drinking alcohol. She will call around and try to find him and call back about appt.

## 2015-08-11 NOTE — Telephone Encounter (Signed)
Gave and pritned appt sched and avs for pt for June thru Aug

## 2015-08-12 ENCOUNTER — Other Ambulatory Visit (HOSPITAL_BASED_OUTPATIENT_CLINIC_OR_DEPARTMENT_OTHER): Payer: Medicare Other

## 2015-08-12 ENCOUNTER — Other Ambulatory Visit: Payer: Self-pay | Admitting: Internal Medicine

## 2015-08-12 ENCOUNTER — Other Ambulatory Visit: Payer: Self-pay | Admitting: Medical Oncology

## 2015-08-12 ENCOUNTER — Ambulatory Visit (HOSPITAL_BASED_OUTPATIENT_CLINIC_OR_DEPARTMENT_OTHER): Payer: Medicare Other

## 2015-08-12 ENCOUNTER — Ambulatory Visit: Payer: Medicare Other

## 2015-08-12 ENCOUNTER — Telehealth: Payer: Self-pay | Admitting: Internal Medicine

## 2015-08-12 ENCOUNTER — Encounter: Payer: Self-pay | Admitting: *Deleted

## 2015-08-12 VITALS — BP 141/74 | HR 89 | Temp 98.6°F | Resp 18

## 2015-08-12 DIAGNOSIS — C3432 Malignant neoplasm of lower lobe, left bronchus or lung: Secondary | ICD-10-CM

## 2015-08-12 DIAGNOSIS — Z79899 Other long term (current) drug therapy: Secondary | ICD-10-CM | POA: Diagnosis not present

## 2015-08-12 DIAGNOSIS — Z5111 Encounter for antineoplastic chemotherapy: Secondary | ICD-10-CM | POA: Diagnosis not present

## 2015-08-12 DIAGNOSIS — E875 Hyperkalemia: Secondary | ICD-10-CM

## 2015-08-12 DIAGNOSIS — Z5112 Encounter for antineoplastic immunotherapy: Secondary | ICD-10-CM

## 2015-08-12 DIAGNOSIS — C3492 Malignant neoplasm of unspecified part of left bronchus or lung: Secondary | ICD-10-CM

## 2015-08-12 LAB — BASIC METABOLIC PANEL
Anion Gap: 12 mEq/L — ABNORMAL HIGH (ref 3–11)
BUN: 22.1 mg/dL (ref 7.0–26.0)
CALCIUM: 9.8 mg/dL (ref 8.4–10.4)
CO2: 18 meq/L — AB (ref 22–29)
Chloride: 107 mEq/L (ref 98–109)
Creatinine: 1.9 mg/dL — ABNORMAL HIGH (ref 0.7–1.3)
EGFR: 45 mL/min/{1.73_m2} — AB (ref >90)
GLUCOSE: 193 mg/dL — AB (ref 70–140)
SODIUM: 137 meq/L (ref 136–145)

## 2015-08-12 LAB — TSH: TSH: 5.26 m(IU)/L — ABNORMAL HIGH (ref 0.320–4.118)

## 2015-08-12 LAB — UA PROTEIN, DIPSTICK - CHCC: Protein, ur: NEGATIVE mg/dL

## 2015-08-12 MED ORDER — SODIUM POLYSTYRENE SULFONATE 15 GM/60ML PO SUSP
30.0000 g | Freq: Once | ORAL | Status: AC
  Administered 2015-08-12: 30 g via ORAL
  Filled 2015-08-12: qty 120

## 2015-08-12 MED ORDER — DIPHENHYDRAMINE HCL 50 MG/ML IJ SOLN
50.0000 mg | Freq: Once | INTRAMUSCULAR | Status: AC
  Administered 2015-08-12: 50 mg via INTRAVENOUS

## 2015-08-12 MED ORDER — SODIUM CHLORIDE 0.9 % IV SOLN
10.0000 mg | Freq: Once | INTRAVENOUS | Status: AC
  Administered 2015-08-12: 10 mg via INTRAVENOUS
  Filled 2015-08-12: qty 1

## 2015-08-12 MED ORDER — SODIUM CHLORIDE 0.9 % IV SOLN
75.0000 mg/m2 | Freq: Once | INTRAVENOUS | Status: AC
  Administered 2015-08-12: 150 mg via INTRAVENOUS
  Filled 2015-08-12: qty 15

## 2015-08-12 MED ORDER — SODIUM CHLORIDE 0.9 % IV SOLN
Freq: Once | INTRAVENOUS | Status: AC
  Administered 2015-08-12: 16:00:00 via INTRAVENOUS

## 2015-08-12 MED ORDER — HEPARIN SOD (PORK) LOCK FLUSH 100 UNIT/ML IV SOLN
500.0000 [IU] | Freq: Once | INTRAVENOUS | Status: AC | PRN
  Administered 2015-08-12: 500 [IU]
  Filled 2015-08-12: qty 5

## 2015-08-12 MED ORDER — DIPHENHYDRAMINE HCL 50 MG/ML IJ SOLN
INTRAMUSCULAR | Status: AC
  Filled 2015-08-12: qty 1

## 2015-08-12 MED ORDER — SODIUM CHLORIDE 0.9 % IV SOLN
10.0000 mg/kg | Freq: Once | INTRAVENOUS | Status: AC
  Administered 2015-08-12: 800 mg via INTRAVENOUS
  Filled 2015-08-12: qty 70

## 2015-08-12 MED ORDER — SODIUM CHLORIDE 0.9 % IV SOLN
Freq: Once | INTRAVENOUS | Status: AC
  Administered 2015-08-12: 15:00:00 via INTRAVENOUS

## 2015-08-12 MED ORDER — SODIUM CHLORIDE 0.9% FLUSH
10.0000 mL | INTRAVENOUS | Status: DC | PRN
  Administered 2015-08-12: 10 mL
  Filled 2015-08-12: qty 10

## 2015-08-12 MED ORDER — ACETAMINOPHEN 325 MG PO TABS
650.0000 mg | ORAL_TABLET | Freq: Once | ORAL | Status: AC
  Administered 2015-08-12: 650 mg via ORAL

## 2015-08-12 MED ORDER — ACETAMINOPHEN 325 MG PO TABS
ORAL_TABLET | ORAL | Status: AC
  Filled 2015-08-12: qty 2

## 2015-08-12 NOTE — Progress Notes (Signed)
Okay to treat per MD Irene Limbo

## 2015-08-12 NOTE — Progress Notes (Signed)
Pt arrived to infusion room for treatment which was rescheduled from yesterday per MD office note.  Pt was scheduled for follow up labs to recheck K+ level.  Follow up labs showed increased hyperkalemia as well as increased creatinine level.  Dr. Irene Limbo, on call MD, notified and after chart review okay given to proceed with treatment.  Pt will receive 1 dose of kayexoalate while in infusion room.  Meds reviewed and Dr. Irene Limbo recommends pt follow up with PCP to ensure no interactions leading to elevated K+ levels.  Additional orders given for pt to receive 1L NS while here for treatment.  I reviewed plan of care with pt and he is in agreement.  Pt unable to inform this RN of his current meds as he states his wife manages all of his medications.  I instructed pt I would discuss this with his wife when she returns to pick him up from treatment.  Discussed with pt the importance of holding his losartan and bactrim (if currently taking) and following up with his PCP as they could be causing his upward trend in his K+ level.  Pt verbalized understanding and stated he would have his wife call for appt tomorrow.  Pt will also come back for recheck of labs on 08/14/15 prior to injection per Dr. Irene Limbo.    1723: Upon discharge, all information previously charted reviewed with wife and patient.  Wife confirmed pt was taking Bactrim and Hyzaar.  Wife states she will call PCP tomorrow to discuss elevated labs in relation to current med list and set up for follow up appointment.  Pt and wife verbalized understanding for pt to return on 08/14/15 for repeat blood work prior to [employer] injection.  Pt instructed to notify us if any questions or concerns.  Pt discharged ambulatory with wife and grandchild with assistance of personal cane and wife.  No further complaints at time of discharge.

## 2015-08-12 NOTE — Patient Instructions (Signed)
Hyperkalemia Hyperkalemia is when you have too much potassium in your blood. Potassium is normally removed (excreted) from your body by your kidneys. If there is too much potassium in your blood, it can affect your heart's ability to function.  CAUSES  Hyperkalemia may be caused by:  1. Taking in too much potassium. You can do this by: 1. Using salt substitutes. They contain large amounts of potassium. 2. Taking potassium supplements. 3. Eating foods high in potassium. 2. Excreting too little potassium. This can happen if: 1. Your kidneys are not working properly. Kidney (renal) disease, including short- or long-term renal failure, is a very common cause of hyperkalemia. 2. You are taking medicines that lower your excretion of potassium. 3. You have Addison disease. 4. You have a urinary tract blockage, such as kidney stones. 5. You are on treatment to mechanically clean your blood (dialysis) and you skip a treatment. 3. Releasing a high amount of potassium from your cells into your blood. This can happen with: 1. Injury to muscles (rhabdomyolysis) or other tissues. Most potassium is stored in your muscles. 2. Severe burns or infections. 3. Acidic blood plasma (acidosis). Acidosis can result from many diseases, such as uncontrolled diabetes. RISK FACTORS The most common risk factor of hyperkalemia is kidney disease. Other risk factors of hyperkalemia include: 1. Addison disease. This is a condition where your glands do not produce enough hormones. 2. Alcoholism or heavy drug use.  3. Using certain blood pressure medicines, such as angiotensin-converting enzyme (ACE) inhibitors, angiotensin II receptor blockers (ARBs), or potassium-sparing diuretics such as spironolactone. 4. Severe injury or burn. SIGNS AND SYMPTOMS  Oftentimes, there are no signs or symptoms of hyperkalemia. However, when your potassium level becomes high enough, you may experience symptoms such as:  Irregular or very  slow heartbeat.  Nausea.  Fatigue.  Tingling of the skin or numbness of the hands or feet.  Muscle weakness.  Fatigue.  Not being able to move (paralysis). You may not have any symptoms of hyperkalemia.  DIAGNOSIS  Hyperkalemia may be diagnosed by:  Physical exam.  Blood tests.  ECG (electrocardiogram).  Discussion of prescription and non-prescription drug use. TREATMENT  Treatment for hyperkalemia is often directed at the underlying cause. In some instances, treatment may include:   Insulin.  Glucose (sugar) and water solution given through a vein (intravenous or IV).  Dialysis.  Medicines to remove the potassium from your body.  Medicines to move calcium from your bloodstream into your tissues. HOME CARE INSTRUCTIONS   Take medicines only as directed by your health care provider.  Do not take any supplements, natural products, herbs, or vitamins without reviewing them with your health care provider. Certain supplements and natural food products can have high amounts of potassium.  Limit your alcohol intake as directed by your health care provider.  Stop illegal drug use. If you need help quitting, ask your health care provider.  Keep all follow-up visits as directed by your health care provider. This is important.  If you have kidney disease, you may need to follow a low potassium diet. A dietitian can help educate you on low potassium foods. SEEK MEDICAL CARE IF:   You notice an irregular or very slow heartbeat.  You feel light-headed.  You feel weak.  You are nauseous.  You have tingling or numbness in your hands or feet. SEEK IMMEDIATE MEDICAL CARE IF:   You have shortness of breath.  You have chest pain or discomfort.  You pass out.  You have muscle paralysis. MAKE SURE YOU:   Understand these instructions.  Will watch your condition.  Will get help right away if you are not doing well or get worse.   This information is not intended  to replace advice given to you by your health care provider. Make sure you discuss any questions you have with your health care provider.   Document Released: 01/22/2002 Document Revised: 02/22/2014 Document Reviewed: 05/09/2013 Elsevier Interactive Patient Education 2016 [place] Discharge Instructions for Patients Receiving Chemotherapy  Today you received the following chemotherapy agents Cyramza/Taxotere.  To help prevent nausea and vomiting after your treatment, we encourage you to take your nausea medication as directed.   If you develop nausea and vomiting that is not controlled by your nausea medication, call the clinic.   BELOW ARE SYMPTOMS THAT SHOULD BE REPORTED IMMEDIATELY:  *FEVER GREATER THAN 100.5 F  *CHILLS WITH OR WITHOUT FEVER  NAUSEA AND VOMITING THAT IS NOT CONTROLLED WITH YOUR NAUSEA MEDICATION  *UNUSUAL SHORTNESS OF BREATH  *UNUSUAL BRUISING OR BLEEDING  TENDERNESS IN MOUTH AND THROAT WITH OR WITHOUT PRESENCE OF ULCERS  *URINARY PROBLEMS  *BOWEL PROBLEMS  UNUSUAL RASH Items with * indicate a potential emergency and should be followed up as soon as possible.  Feel free to call the clinic you have any questions or concerns. The clinic phone number is [phone].  Please show the [place] at check-in to the Emergency Department and triage nurse.

## 2015-08-12 NOTE — Telephone Encounter (Signed)
spoke w/ pt confirmed added lab to 6/29 inj

## 2015-08-12 NOTE — Telephone Encounter (Signed)
spoke w/ wife confirmed add lab apt today

## 2015-08-13 ENCOUNTER — Ambulatory Visit: Payer: Medicare Other

## 2015-08-14 ENCOUNTER — Other Ambulatory Visit (HOSPITAL_BASED_OUTPATIENT_CLINIC_OR_DEPARTMENT_OTHER): Payer: Medicare Other

## 2015-08-14 ENCOUNTER — Ambulatory Visit (HOSPITAL_BASED_OUTPATIENT_CLINIC_OR_DEPARTMENT_OTHER): Payer: Medicare Other

## 2015-08-14 VITALS — BP 127/69 | HR 78 | Temp 98.3°F | Resp 20

## 2015-08-14 DIAGNOSIS — C3490 Malignant neoplasm of unspecified part of unspecified bronchus or lung: Secondary | ICD-10-CM

## 2015-08-14 DIAGNOSIS — C3432 Malignant neoplasm of lower lobe, left bronchus or lung: Secondary | ICD-10-CM | POA: Diagnosis present

## 2015-08-14 DIAGNOSIS — C3492 Malignant neoplasm of unspecified part of left bronchus or lung: Secondary | ICD-10-CM

## 2015-08-14 LAB — CBC WITH DIFFERENTIAL/PLATELET
BASO%: 1.7 % (ref 0.0–2.0)
Basophils Absolute: 0.2 10*3/uL — ABNORMAL HIGH (ref 0.0–0.1)
EOS%: 0 % (ref 0.0–7.0)
Eosinophils Absolute: 0 10*3/uL (ref 0.0–0.5)
HEMATOCRIT: 32.5 % — AB (ref 38.4–49.9)
HEMOGLOBIN: 10.2 g/dL — AB (ref 13.0–17.1)
LYMPH#: 0.4 10*3/uL — AB (ref 0.9–3.3)
LYMPH%: 4.3 % — ABNORMAL LOW (ref 14.0–49.0)
MCH: 22.8 pg — ABNORMAL LOW (ref 27.2–33.4)
MCHC: 31.5 g/dL — ABNORMAL LOW (ref 32.0–36.0)
MCV: 72.2 fL — ABNORMAL LOW (ref 79.3–98.0)
MONO#: 0.4 10*3/uL (ref 0.1–0.9)
MONO%: 3.6 % (ref 0.0–14.0)
NEUT%: 90.4 % — ABNORMAL HIGH (ref 39.0–75.0)
NEUTROS ABS: 9.2 10*3/uL — AB (ref 1.5–6.5)
Platelets: 256 10*3/uL (ref 140–400)
RBC: 4.5 10*6/uL (ref 4.20–5.82)
RDW: 17.6 % — AB (ref 11.0–14.6)
WBC: 10.2 10*3/uL (ref 4.0–10.3)

## 2015-08-14 LAB — COMPREHENSIVE METABOLIC PANEL
ALBUMIN: 3.4 g/dL — AB (ref 3.5–5.0)
ALK PHOS: 81 U/L (ref 40–150)
ALT: 15 U/L (ref 0–55)
AST: 15 U/L (ref 5–34)
Anion Gap: 8 mEq/L (ref 3–11)
BUN: 26.4 mg/dL — AB (ref 7.0–26.0)
CALCIUM: 9 mg/dL (ref 8.4–10.4)
CO2: 21 mEq/L — ABNORMAL LOW (ref 22–29)
CREATININE: 1.5 mg/dL — AB (ref 0.7–1.3)
Chloride: 108 mEq/L (ref 98–109)
EGFR: 62 mL/min/{1.73_m2} — ABNORMAL LOW (ref >90)
GLUCOSE: 300 mg/dL — AB (ref 70–140)
Potassium: 5.4 mEq/L — ABNORMAL HIGH (ref 3.5–5.1)
SODIUM: 138 meq/L (ref 136–145)
Total Bilirubin: <0.3 mg/dL (ref 0.20–1.20)
Total Protein: 6.7 g/dL (ref 6.4–8.3)

## 2015-08-14 MED ORDER — PEGFILGRASTIM INJECTION 6 MG/0.6ML ~~LOC~~
6.0000 mg | PREFILLED_SYRINGE | Freq: Once | SUBCUTANEOUS | Status: AC
  Administered 2015-08-14: 6 mg via SUBCUTANEOUS
  Filled 2015-08-14: qty 0.6

## 2015-08-14 NOTE — Patient Instructions (Signed)
Pegfilgrastim injection What is this medicine? PEGFILGRASTIM (PEG fil gra stim) is a long-acting granulocyte colony-stimulating factor that stimulates the growth of neutrophils, a type of white blood cell important in the body's fight against infection. It is used to reduce the incidence of fever and infection in patients with certain types of cancer who are receiving chemotherapy that affects the bone marrow, and to increase survival after being exposed to high doses of radiation. This medicine may be used for other purposes; ask your health care provider or pharmacist if you have questions. What should I tell my health care provider before I take this medicine? They need to know if you have any of these conditions: -kidney disease -latex allergy -ongoing radiation therapy -sickle cell disease -skin reactions to acrylic adhesives (On-Body Injector only) -an unusual or allergic reaction to pegfilgrastim, filgrastim, other medicines, foods, dyes, or preservatives -pregnant or trying to get pregnant -breast-feeding How should I use this medicine? This medicine is for injection under the skin. If you get this medicine at home, you will be taught how to prepare and give the pre-filled syringe or how to use the On-body Injector. Refer to the patient Instructions for Use for detailed instructions. Use exactly as directed. Take your medicine at regular intervals. Do not take your medicine more often than directed. It is important that you put your used needles and syringes in a special sharps container. Do not put them in a trash can. If you do not have a sharps container, call your pharmacist or healthcare provider to get one. Talk to your pediatrician regarding the use of this medicine in children. While this drug may be prescribed for selected conditions, precautions do apply. Overdosage: If you think you have taken too much of this medicine contact a poison control center or emergency room at  once. NOTE: This medicine is only for you. Do not share this medicine with others. What if I miss a dose? It is important not to miss your dose. Call your doctor or health care professional if you miss your dose. If you miss a dose due to an On-body Injector failure or leakage, a new dose should be administered as soon as possible using a single prefilled syringe for manual use. What may interact with this medicine? Interactions have not been studied. Give your health care provider a list of all the medicines, herbs, non-prescription drugs, or dietary supplements you use. Also tell them if you smoke, drink alcohol, or use illegal drugs. Some items may interact with your medicine. This list may not describe all possible interactions. Give your health care provider a list of all the medicines, herbs, non-prescription drugs, or dietary supplements you use. Also tell them if you smoke, drink alcohol, or use illegal drugs. Some items may interact with your medicine. What should I watch for while using this medicine? You may need blood work done while you are taking this medicine. If you are going to need a MRI, CT scan, or other procedure, tell your doctor that you are using this medicine (On-Body Injector only). What side effects may I notice from receiving this medicine? Side effects that you should report to your doctor or health care professional as soon as possible: -allergic reactions like skin rash, itching or hives, swelling of the face, lips, or tongue -dizziness -fever -pain, redness, or irritation at site where injected -pinpoint red spots on the skin -red or dark-brown urine -shortness of breath or breathing problems -stomach or side pain, or pain   at the shoulder -swelling -tiredness -trouble passing urine or change in the amount of urine Side effects that usually do not require medical attention (report to your doctor or health care professional if they continue or are  bothersome): -bone pain -muscle pain This list may not describe all possible side effects. Call your doctor for medical advice about side effects. You may report side effects to FDA at 1-800-FDA-1088. Where should I keep my medicine? Keep out of the reach of children. Store pre-filled syringes in a refrigerator between 2 and 8 degrees C (36 and 46 degrees F). Do not freeze. Keep in carton to protect from light. Throw away this medicine if it is left out of the refrigerator for more than 48 hours. Throw away any unused medicine after the expiration date. NOTE: This sheet is a summary. It may not cover all possible information. If you have questions about this medicine, talk to your doctor, pharmacist, or health care provider.    2016, [employer] Standard. (2014-02-21 14:30:14)  

## 2015-08-18 ENCOUNTER — Other Ambulatory Visit (HOSPITAL_BASED_OUTPATIENT_CLINIC_OR_DEPARTMENT_OTHER): Payer: Medicare Other

## 2015-08-18 DIAGNOSIS — C3492 Malignant neoplasm of unspecified part of left bronchus or lung: Secondary | ICD-10-CM

## 2015-08-18 DIAGNOSIS — C3432 Malignant neoplasm of lower lobe, left bronchus or lung: Secondary | ICD-10-CM | POA: Diagnosis not present

## 2015-08-18 LAB — COMPREHENSIVE METABOLIC PANEL
ALBUMIN: 3.3 g/dL — AB (ref 3.5–5.0)
ALK PHOS: 123 U/L (ref 40–150)
ALT: 14 U/L (ref 0–55)
ANION GAP: 8 meq/L (ref 3–11)
AST: 15 U/L (ref 5–34)
BUN: 19.4 mg/dL (ref 7.0–26.0)
CO2: 25 mEq/L (ref 22–29)
CREATININE: 1.4 mg/dL — AB (ref 0.7–1.3)
Calcium: 9.2 mg/dL (ref 8.4–10.4)
Chloride: 109 mEq/L (ref 98–109)
EGFR: 67 mL/min/{1.73_m2} — AB (ref >90)
GLUCOSE: 164 mg/dL — AB (ref 70–140)
Potassium: 4.5 mEq/L (ref 3.5–5.1)
Sodium: 141 mEq/L (ref 136–145)
TOTAL PROTEIN: 6.6 g/dL (ref 6.4–8.3)

## 2015-08-18 LAB — CBC WITH DIFFERENTIAL/PLATELET
BASO%: 0.4 % (ref 0.0–2.0)
Basophils Absolute: 0 10*3/uL (ref 0.0–0.1)
EOS ABS: 0 10*3/uL (ref 0.0–0.5)
EOS%: 0.5 % (ref 0.0–7.0)
HCT: 28.4 % — ABNORMAL LOW (ref 38.4–49.9)
HEMOGLOBIN: 8.9 g/dL — AB (ref 13.0–17.1)
LYMPH#: 0.9 10*3/uL (ref 0.9–3.3)
LYMPH%: 13.9 % — ABNORMAL LOW (ref 14.0–49.0)
MCH: 22.6 pg — ABNORMAL LOW (ref 27.2–33.4)
MCHC: 31.4 g/dL — ABNORMAL LOW (ref 32.0–36.0)
MCV: 71.9 fL — AB (ref 79.3–98.0)
MONO#: 0.3 10*3/uL (ref 0.1–0.9)
MONO%: 5.1 % (ref 0.0–14.0)
NEUT%: 80.1 % — ABNORMAL HIGH (ref 39.0–75.0)
NEUTROS ABS: 5.2 10*3/uL (ref 1.5–6.5)
PLATELETS: 125 10*3/uL — AB (ref 140–400)
RBC: 3.95 10*6/uL — ABNORMAL LOW (ref 4.20–5.82)
RDW: 17.3 % — AB (ref 11.0–14.6)
WBC: 6.5 10*3/uL (ref 4.0–10.3)

## 2015-08-21 ENCOUNTER — Ambulatory Visit: Payer: Medicare Other

## 2015-08-26 ENCOUNTER — Other Ambulatory Visit (HOSPITAL_BASED_OUTPATIENT_CLINIC_OR_DEPARTMENT_OTHER): Payer: Medicare Other

## 2015-08-26 DIAGNOSIS — C3432 Malignant neoplasm of lower lobe, left bronchus or lung: Secondary | ICD-10-CM | POA: Diagnosis present

## 2015-08-26 DIAGNOSIS — C3492 Malignant neoplasm of unspecified part of left bronchus or lung: Secondary | ICD-10-CM

## 2015-08-26 LAB — CBC WITH DIFFERENTIAL/PLATELET
BASO%: 0.3 % (ref 0.0–2.0)
Basophils Absolute: 0 10*3/uL (ref 0.0–0.1)
EOS ABS: 0 10*3/uL (ref 0.0–0.5)
EOS%: 0.2 % (ref 0.0–7.0)
HEMATOCRIT: 30.5 % — AB (ref 38.4–49.9)
HGB: 9.6 g/dL — ABNORMAL LOW (ref 13.0–17.1)
LYMPH#: 1.6 10*3/uL (ref 0.9–3.3)
LYMPH%: 11.2 % — AB (ref 14.0–49.0)
MCH: 22.8 pg — ABNORMAL LOW (ref 27.2–33.4)
MCHC: 31.3 g/dL — AB (ref 32.0–36.0)
MCV: 72.8 fL — AB (ref 79.3–98.0)
MONO#: 1.1 10*3/uL — AB (ref 0.1–0.9)
MONO%: 7.6 % (ref 0.0–14.0)
NEUT#: 11.6 10*3/uL — ABNORMAL HIGH (ref 1.5–6.5)
NEUT%: 80.7 % — AB (ref 39.0–75.0)
PLATELETS: 149 10*3/uL (ref 140–400)
RBC: 4.2 10*6/uL (ref 4.20–5.82)
RDW: 17.3 % — ABNORMAL HIGH (ref 11.0–14.6)
WBC: 14.4 10*3/uL — ABNORMAL HIGH (ref 4.0–10.3)

## 2015-08-26 LAB — COMPREHENSIVE METABOLIC PANEL
ALT: 11 U/L (ref 0–55)
ANION GAP: 9 meq/L (ref 3–11)
AST: 17 U/L (ref 5–34)
Albumin: 3.4 g/dL — ABNORMAL LOW (ref 3.5–5.0)
Alkaline Phosphatase: 103 U/L (ref 40–150)
BUN: 14.1 mg/dL (ref 7.0–26.0)
CALCIUM: 8.9 mg/dL (ref 8.4–10.4)
CHLORIDE: 107 meq/L (ref 98–109)
CO2: 24 meq/L (ref 22–29)
CREATININE: 1.4 mg/dL — AB (ref 0.7–1.3)
EGFR: 66 mL/min/{1.73_m2} — AB (ref >90)
Glucose: 160 mg/dl — ABNORMAL HIGH (ref 70–140)
Potassium: 4.9 mEq/L (ref 3.5–5.1)
Sodium: 140 mEq/L (ref 136–145)
TOTAL PROTEIN: 6.5 g/dL (ref 6.4–8.3)

## 2015-09-02 ENCOUNTER — Encounter: Payer: Self-pay | Admitting: Internal Medicine

## 2015-09-02 ENCOUNTER — Other Ambulatory Visit (HOSPITAL_BASED_OUTPATIENT_CLINIC_OR_DEPARTMENT_OTHER): Payer: Medicare Other

## 2015-09-02 ENCOUNTER — Ambulatory Visit (HOSPITAL_BASED_OUTPATIENT_CLINIC_OR_DEPARTMENT_OTHER): Payer: Medicare Other | Admitting: Internal Medicine

## 2015-09-02 ENCOUNTER — Ambulatory Visit (HOSPITAL_BASED_OUTPATIENT_CLINIC_OR_DEPARTMENT_OTHER): Payer: Medicare Other

## 2015-09-02 ENCOUNTER — Other Ambulatory Visit: Payer: Self-pay | Admitting: *Deleted

## 2015-09-02 ENCOUNTER — Other Ambulatory Visit: Payer: Self-pay | Admitting: Internal Medicine

## 2015-09-02 ENCOUNTER — Ambulatory Visit: Payer: Medicare Other | Admitting: Nutrition

## 2015-09-02 ENCOUNTER — Telehealth: Payer: Self-pay | Admitting: Internal Medicine

## 2015-09-02 VITALS — BP 142/72 | HR 98 | Temp 97.7°F | Resp 18 | Ht 71.0 in | Wt 177.1 lb

## 2015-09-02 DIAGNOSIS — Z5112 Encounter for antineoplastic immunotherapy: Secondary | ICD-10-CM

## 2015-09-02 DIAGNOSIS — C3490 Malignant neoplasm of unspecified part of unspecified bronchus or lung: Secondary | ICD-10-CM

## 2015-09-02 DIAGNOSIS — C3492 Malignant neoplasm of unspecified part of left bronchus or lung: Secondary | ICD-10-CM

## 2015-09-02 DIAGNOSIS — C3432 Malignant neoplasm of lower lobe, left bronchus or lung: Secondary | ICD-10-CM | POA: Diagnosis present

## 2015-09-02 DIAGNOSIS — G4700 Insomnia, unspecified: Secondary | ICD-10-CM

## 2015-09-02 LAB — COMPREHENSIVE METABOLIC PANEL
ALBUMIN: 3.5 g/dL (ref 3.5–5.0)
ALK PHOS: 104 U/L (ref 40–150)
ALT: 15 U/L (ref 0–55)
AST: 17 U/L (ref 5–34)
Anion Gap: 7 mEq/L (ref 3–11)
BUN: 19.1 mg/dL (ref 7.0–26.0)
CALCIUM: 9 mg/dL (ref 8.4–10.4)
CO2: 22 mEq/L (ref 22–29)
CREATININE: 1.2 mg/dL (ref 0.7–1.3)
Chloride: 110 mEq/L — ABNORMAL HIGH (ref 98–109)
EGFR: 77 mL/min/{1.73_m2} — ABNORMAL LOW (ref >90)
GLUCOSE: 278 mg/dL — AB (ref 70–140)
POTASSIUM: 5.9 meq/L — AB (ref 3.5–5.1)
SODIUM: 139 meq/L (ref 136–145)
TOTAL PROTEIN: 7 g/dL (ref 6.4–8.3)

## 2015-09-02 LAB — CBC WITH DIFFERENTIAL/PLATELET
BASO%: 0.1 % (ref 0.0–2.0)
BASOS ABS: 0 10*3/uL (ref 0.0–0.1)
EOS ABS: 0 10*3/uL (ref 0.0–0.5)
EOS%: 0 % (ref 0.0–7.0)
HEMATOCRIT: 32.2 % — AB (ref 38.4–49.9)
HEMOGLOBIN: 10.1 g/dL — AB (ref 13.0–17.1)
LYMPH#: 1.2 10*3/uL (ref 0.9–3.3)
LYMPH%: 4.4 % — ABNORMAL LOW (ref 14.0–49.0)
MCH: 22.8 pg — AB (ref 27.2–33.4)
MCHC: 31.3 g/dL — ABNORMAL LOW (ref 32.0–36.0)
MCV: 73.1 fL — AB (ref 79.3–98.0)
MONO#: 0.9 10*3/uL (ref 0.1–0.9)
MONO%: 3.2 % (ref 0.0–14.0)
NEUT%: 92.3 % — ABNORMAL HIGH (ref 39.0–75.0)
NEUTROS ABS: 25.3 10*3/uL — AB (ref 1.5–6.5)
Platelets: 211 10*3/uL (ref 140–400)
RBC: 4.4 10*6/uL (ref 4.20–5.82)
RDW: 17.4 % — AB (ref 11.0–14.6)
WBC: 27.5 10*3/uL — AB (ref 4.0–10.3)

## 2015-09-02 LAB — UA PROTEIN, DIPSTICK - CHCC: PROTEIN: 30 mg/dL

## 2015-09-02 MED ORDER — SODIUM CHLORIDE 0.9 % IV SOLN
10.0000 mg/kg | Freq: Once | INTRAVENOUS | Status: AC
  Administered 2015-09-02: 800 mg via INTRAVENOUS
  Filled 2015-09-02: qty 70

## 2015-09-02 MED ORDER — DOCETAXEL CHEMO INJECTION 160 MG/16ML
75.0000 mg/m2 | Freq: Once | INTRAVENOUS | Status: AC
  Administered 2015-09-02: 150 mg via INTRAVENOUS
  Filled 2015-09-02: qty 15

## 2015-09-02 MED ORDER — SODIUM POLYSTYRENE SULFONATE 15 GM/60ML PO SUSP: 30.0000 g | Freq: Once | ORAL | Status: DC

## 2015-09-02 MED ORDER — ACETAMINOPHEN 325 MG PO TABS
650.0000 mg | ORAL_TABLET | Freq: Once | ORAL | Status: AC
  Administered 2015-09-02: 650 mg via ORAL

## 2015-09-02 MED ORDER — SODIUM CHLORIDE 0.9% FLUSH
10.0000 mL | INTRAVENOUS | Status: DC | PRN
  Administered 2015-09-02: 10 mL
  Filled 2015-09-02: qty 10

## 2015-09-02 MED ORDER — DIPHENHYDRAMINE HCL 50 MG/ML IJ SOLN
INTRAMUSCULAR | Status: AC
  Filled 2015-09-02: qty 1

## 2015-09-02 MED ORDER — DIPHENHYDRAMINE HCL 50 MG/ML IJ SOLN
50.0000 mg | Freq: Once | INTRAMUSCULAR | Status: AC
  Administered 2015-09-02: 50 mg via INTRAVENOUS

## 2015-09-02 MED ORDER — ACETAMINOPHEN 325 MG PO TABS
ORAL_TABLET | ORAL | Status: AC
  Filled 2015-09-02: qty 2

## 2015-09-02 MED ORDER — SODIUM CHLORIDE 0.9 % IV SOLN
Freq: Once | INTRAVENOUS | Status: AC
  Administered 2015-09-02: 13:00:00 via INTRAVENOUS

## 2015-09-02 MED ORDER — SODIUM CHLORIDE 0.9 % IV SOLN
10.0000 mg | Freq: Once | INTRAVENOUS | Status: AC
  Administered 2015-09-02: 10 mg via INTRAVENOUS
  Filled 2015-09-02: qty 1

## 2015-09-02 MED ORDER — HEPARIN SOD (PORK) LOCK FLUSH 100 UNIT/ML IV SOLN
500.0000 [IU] | Freq: Once | INTRAVENOUS | Status: AC | PRN
  Administered 2015-09-02: 500 [IU]
  Filled 2015-09-02: qty 5

## 2015-09-02 NOTE — Progress Notes (Signed)
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  PRIOR THERAPY:  1) Systemic chemotherapy with carboplatin for AUC of 5 given on day 1 and gemcitabine 1000 MG/M2 on days 1 and 8 every 3 weeks. Status post 6 cycles with partial response. 2) Nivolumab 240 MG IV every 2 weeks status post 4 cycles, last dose was given 03/18/2015 discontinued today secondary to disease progression.  CURRENT THERAPY: Systemic chemotherapy with docetaxel 75 MG/M2 and Cyramza 10 MG/KG every 3 weeks, first dose 04/08/2015. Status post 7 cycles.  CODE STATUS: Full code initially but no prolonged resuscitation.   INTERVAL HISTORY: Keith Holloway 55 y.o. male returns to the clinic today for follow-up visit accompanied by his sister. The patient is feeling fine today with no specific complaints. He tolerated the last cycle of his systemic chemotherapy with docetaxel and Cyramza fairly well. He denied having any significant chest pain, shortness of breath or hemoptysis. The patient denied having any fever or chills. He has no nausea or vomiting. The patient denied having any bleeding issues. He is here today to start cycle #8.  MEDICAL HISTORY: Past Medical History  Diagnosis Date  . Diabetes mellitus without complication ([place])   . Hypertension   . High cholesterol   . Migraine   . Depression   . Arthritis   . Alcohol abuse   . Polysubstance abuse     alcohol and cocaine  . Polyneuropathy in diabetes(357.2)   . Memory disorder 08/22/2013  . CHF (congestive heart failure) ([place])   . Tobacco abuse counseling 10/09/2014  . Legally blind   . Insomnia 05/20/2015  . Skin infection 06/10/2015  . Cancer ([place]) dx'd 04/2014    Lung Cancer    ALLERGIES:  has No Known  Allergies.  MEDICATIONS:  Current Outpatient Prescriptions  Medication Sig Dispense Refill  . aspirin EC 81 MG tablet Take 1 tablet by mouth daily.    . benzonatate (TESSALON) 100 MG capsule TAKE ONE CAPSULE EVERY 8 HOURS 21 capsule 0  . dexamethasone (DECADRON) 4 MG tablet 2 tablets by mouth twice a day the day before, day of and day after the chemotherapy every 3 weeks 40 tablet 1  . dexamethasone (DECADRON) 4 MG tablet 2 TABLETS BY MOUTH TWICE A DAY THE DAY BEFORE, DAY OF AND DAY AFTER THE CHEMOTHERAPY EVERY 3 WEEKS 40 tablet 1  . dextrose ([facility] GLUCOSE) 40 % GEL Take by mouth.    . Ferrous Sulfate (IRON) 325 (65 Fe) MG TABS Take 1 tablet by mouth daily.    [facility] HYDROcodone-acetaminophen (NORCO/VICODIN) 5-325 MG tablet Take 2 tablets by mouth every 6 (six) hours as needed. 60 tablet 0  . insulin NPH-regular Human (NOVOLIN 70/30) (70-30) 100 UNIT/ML injection Inject 35 Units into the skin 2 (two) times daily with a meal. 10 mL 11  . lidocaine-prilocaine (EMLA) cream Apply 1 application topically as needed. 30 g 0  . losartan-hydrochlorothiazide (HYZAAR) 100-25 MG per tablet Take 1 tablet by mouth daily.  2  . metFORMIN (GLUCOPHAGE) 1000 MG tablet Take 1 tablet (1,000 mg total) by mouth 2 (two) times daily. 60 tablet 12  . ondansetron (ZOFRAN) 8 MG tablet TAKE 1 TABLET EVERY 8 HOURS AS NEEDED FOR NAUSEA AND VOMITING 20 tablet 1  .  prochlorperazine (COMPAZINE) 10 MG tablet TAKE 1 TABLET (10 MG TOTAL) BY MOUTH EVERY 6 (SIX) HOURS AS NEEDED FOR NAUSEA OR VOMITING. 30 tablet 0  . simvastatin (ZOCOR) 40 MG tablet Take 1 tablet (40 mg total) by mouth daily. 30 tablet 3  . sodium polystyrene (KAYEXALATE) 15 GM/60ML suspension Take 120 mLs (30 g total) by mouth once. 120 mL 0  . sulfamethoxazole-trimethoprim (BACTRIM DS,SEPTRA DS) 800-160 MG tablet     . temazepam (RESTORIL) 30 MG capsule TAKE ONE CAPSULE AT BEDTIME 30 capsule 0  . vitamin C (ASCORBIC ACID) 500 MG tablet Take 500 mg by mouth daily.      No current facility-administered medications for this visit.    SURGICAL HISTORY:  Past Surgical History  Procedure Laterality Date  . Eye surgery  04/2012    detatched retna, bilateral  . Eye surgery  08/2012  . Cataract extraction      bilaterally  . Retinol detachment Bilateral 2014    REVIEW OF SYSTEMS:  A comprehensive review of systems was negative.   PHYSICAL EXAMINATION: General appearance: alert, cooperative, fatigued and no distress Head: Normocephalic, without obvious abnormality, atraumatic Neck: no adenopathy, no JVD, supple, symmetrical, trachea midline and thyroid not enlarged, symmetric, no tenderness/mass/nodules Lymph nodes: Cervical, supraclavicular, and axillary nodes normal. Resp: clear to auscultation bilaterally Back: symmetric, no curvature. ROM normal. No CVA tenderness. Cardio: regular rate and rhythm, S1, S2 normal, no murmur, click, rub or gallop GI: soft, non-tender; bowel sounds normal; no masses,  no organomegaly Extremities: extremities normal, atraumatic, no cyanosis or edema Neurologic: Alert and oriented X 3, normal strength and tone. Normal symmetric reflexes. Normal coordination and gait  ECOG PERFORMANCE STATUS: 1 - Symptomatic but completely ambulatory  Blood pressure 142/72, pulse 98, temperature 97.7 F (36.5 C), temperature source Oral, resp. rate 18, height '5\' 11"'$  (1.803 m), weight 177 lb 1.6 oz (80.332 kg), SpO2 99 %.  LABORATORY DATA: Lab Results  Component Value Date   WBC 27.5* 09/02/2015   HGB 10.1* 09/02/2015   HCT 32.2* 09/02/2015   MCV 73.1* 09/02/2015   PLT 211 09/02/2015      Chemistry      Component Value Date/Time   NA 140 08/26/2015 1127   NA 142 01/30/2015 1330   K 4.9 08/26/2015 1127   K 4.4 01/30/2015 1330   CL 106 01/30/2015 1330   CO2 24 08/26/2015 1127   CO2 27 01/30/2015 1330   BUN 14.1 08/26/2015 1127   BUN 15 01/30/2015 1330   CREATININE 1.4* 08/26/2015 1127   CREATININE 1.43* 01/30/2015 1330    CREATININE 1.02 01/23/2014 1313      Component Value Date/Time   CALCIUM 8.9 08/26/2015 1127   CALCIUM 9.7 01/30/2015 1330   ALKPHOS 103 08/26/2015 1127   ALKPHOS 108 05/05/2014 2049   AST 17 08/26/2015 1127   AST 20 05/05/2014 2049   ALT 11 08/26/2015 1127   ALT 24 05/05/2014 2049   BILITOT <0.30 08/26/2015 1127   BILITOT 0.5 05/05/2014 2049       RADIOGRAPHIC STUDIES: Ct Chest W Contrast  08/07/2015  CLINICAL DATA:  Stage IV non-small-cell lung cancer. EXAM: CT CHEST, ABDOMEN, AND PELVIS WITH CONTRAST TECHNIQUE: Multidetector CT imaging of the chest, abdomen and pelvis was performed following the standard protocol during bolus administration of intravenous contrast. CONTRAST:  138m ISOVUE-300 IOPAMIDOL (ISOVUE-300) INJECTION 61% COMPARISON:  06/09/2015. FINDINGS: CT CHEST FINDINGS Mediastinum/Lymph Nodes: No supraclavicular adenopathy. A right Port-A-Cath which terminates at the high right atrium.  Normal heart size. Minimal anterior pericardial fluid is favored to be physiologic. No central pulmonary embolism, on this non-dedicated study. No middle mediastinal adenopathy. Anterior mediastinal mass measures on the order of 6.3 x 3.7 cm on image 24/series 2. Compare 6.4 x 4.0 cm previously (when remeasured). Lungs/Pleura: Decrease in trace left pleural fluid. Minimal right pleural thickening. Lower lobe predominant bronchial wall thickening. Left base scarring. Left upper lobe nodularity along the peribronchovascular interstitium is decreased. Example at 13 mm on image 54/series 4 versus 2.2 cm previously. Central left lower lobe lung lesion measures on the order of 2.5 x 1.9 cm on image 38/ series 2 today. Compare 3.3 x 3.1 cm on the prior exam (when remeasured). There are scattered smaller pulmonary nodules bilaterally which are felt to be similar, given differences in slice thickness. Example at 3 mm in the right apex on image 52/series 4. Relatively ill-defined right upper lobe 4 mm nodule  on image 75/series 4. Musculoskeletal: No acute osseous abnormality. CT ABDOMEN PELVIS FINDINGS Hepatobiliary: Right hepatic lobe subcapsular lesion measures 1.7 cm on image 52/series 2 versus 2.2 cm previously. More inferior right hepatic lobe 7 mm lesion on image 67/series 2 measured 11 mm previously (when remeasured). No new liver lesions. Normal gallbladder, without biliary ductal dilatation. Pancreas: Pancreatic atrophy, without duct dilatation or dominant mass. Spleen: Normal in size, without focal abnormality. Adrenals/Urinary Tract: Normal adrenal glands. Normal kidneys, without hydronephrosis. Normal urinary bladder. Stomach/Bowel: Normal stomach, without wall thickening. Normal caliber of bowel loops. Vascular/Lymphatic: Aortic and branch vessel atherosclerosis. No abdominopelvic adenopathy. Reproductive: Normal prostate. Other: No significant free fluid. No evidence of omental or peritoneal disease. Musculoskeletal: No acute osseous abnormality. IMPRESSION: CT CHEST IMPRESSION 1. Similar size of anterior mediastinal mass/adenopathy. 2. Decrease in central left lower lobe and left upper lobe lung lesions. Other scattered pulmonary nodules are tiny and felt to be similar. 3.  Aortic atherosclerosis. 4. Decrease in tiny left pleural effusion. CT ABDOMEN AND PELVIS IMPRESSION 1. Response to therapy of hepatic metastasis. 2. No new sites of metastatic disease within the abdomen or pelvis. Electronically Signed   By: Abigail Miyamoto M.D.   On: 08/07/2015 10:30   Ct Abdomen Pelvis W Contrast  08/07/2015  CLINICAL DATA:  Stage IV non-small-cell lung cancer. EXAM: CT CHEST, ABDOMEN, AND PELVIS WITH CONTRAST TECHNIQUE: Multidetector CT imaging of the chest, abdomen and pelvis was performed following the standard protocol during bolus administration of intravenous contrast. CONTRAST:  167m ISOVUE-300 IOPAMIDOL (ISOVUE-300) INJECTION 61% COMPARISON:  06/09/2015. FINDINGS: CT CHEST FINDINGS Mediastinum/Lymph Nodes:  No supraclavicular adenopathy. A right Port-A-Cath which terminates at the high right atrium. Normal heart size. Minimal anterior pericardial fluid is favored to be physiologic. No central pulmonary embolism, on this non-dedicated study. No middle mediastinal adenopathy. Anterior mediastinal mass measures on the order of 6.3 x 3.7 cm on image 24/series 2. Compare 6.4 x 4.0 cm previously (when remeasured). Lungs/Pleura: Decrease in trace left pleural fluid. Minimal right pleural thickening. Lower lobe predominant bronchial wall thickening. Left base scarring. Left upper lobe nodularity along the peribronchovascular interstitium is decreased. Example at 13 mm on image 54/series 4 versus 2.2 cm previously. Central left lower lobe lung lesion measures on the order of 2.5 x 1.9 cm on image 38/ series 2 today. Compare 3.3 x 3.1 cm on the prior exam (when remeasured). There are scattered smaller pulmonary nodules bilaterally which are felt to be similar, given differences in slice thickness. Example at 3 mm in the right apex on image  52/series 4. Relatively ill-defined right upper lobe 4 mm nodule on image 75/series 4. Musculoskeletal: No acute osseous abnormality. CT ABDOMEN PELVIS FINDINGS Hepatobiliary: Right hepatic lobe subcapsular lesion measures 1.7 cm on image 52/series 2 versus 2.2 cm previously. More inferior right hepatic lobe 7 mm lesion on image 67/series 2 measured 11 mm previously (when remeasured). No new liver lesions. Normal gallbladder, without biliary ductal dilatation. Pancreas: Pancreatic atrophy, without duct dilatation or dominant mass. Spleen: Normal in size, without focal abnormality. Adrenals/Urinary Tract: Normal adrenal glands. Normal kidneys, without hydronephrosis. Normal urinary bladder. Stomach/Bowel: Normal stomach, without wall thickening. Normal caliber of bowel loops. Vascular/Lymphatic: Aortic and branch vessel atherosclerosis. No abdominopelvic adenopathy. Reproductive: Normal  prostate. Other: No significant free fluid. No evidence of omental or peritoneal disease. Musculoskeletal: No acute osseous abnormality. IMPRESSION: CT CHEST IMPRESSION 1. Similar size of anterior mediastinal mass/adenopathy. 2. Decrease in central left lower lobe and left upper lobe lung lesions. Other scattered pulmonary nodules are tiny and felt to be similar. 3.  Aortic atherosclerosis. 4. Decrease in tiny left pleural effusion. CT ABDOMEN AND PELVIS IMPRESSION 1. Response to therapy of hepatic metastasis. 2. No new sites of metastatic disease within the abdomen or pelvis. Electronically Signed   By: Abigail Miyamoto M.D.   On: 08/07/2015 10:30    ASSESSMENT AND PLAN: This is a very pleasant 55 years old African-American male with a stage IV non-small cell lung cancer, squamous cell carcinoma who completed systemic chemotherapy with carboplatin and gemcitabine status post 6 cycles. The patient has tolerated his treatment fairly well with no significant adverse effects except for mild fatigue and nausea. He had evidence for disease progression and the patient was started on treatment with immunotherapy with Nivolumab currently status post 4 cycles. Unfortunately the recent CT scan of the chest, abdomen and pelvis showed interval progression of his disease with increase in the size of the dominant anterior mediastinal mass and left lower lobe perihilar mass. There was also development of new subcarinal adenopathy and multiple new pulmonary nodules as well as questionable liver lesion. He is currently undergoing systemic chemotherapy with docetaxel and Cyramza status post 7 cycles. He is tolerating his treatment fairly well. I recommended for him to continue on the current chemotherapy and he will proceed with cycle #8 today. For insomnia, he will continue on Restoril 30 mg by mouth daily at bedtime when necessary. He would come back for follow-up visit in 3 weeks for reevaluation before starting cycle #9. He  was advised to call immediately if he has any concerning symptoms in the interval. The patient voices understanding of current disease status and treatment options and is in agreement with the current care plan.  All questions were answered. The patient knows to call the clinic with any problems, questions or concerns. We can certainly see the patient much sooner if necessary.  Disclaimer: This note was dictated with voice recognition software. Similar sounding words can inadvertently be transcribed and may not be corrected upon review.

## 2015-09-02 NOTE — Patient Instructions (Signed)
[facility] Discharge Instructions for Patients Receiving Chemotherapy  Today you received the following chemotherapy agents Cyramza and Taxotere. To help prevent nausea and vomiting after your treatment, we encourage you to take your nausea medication as directed.  If you develop nausea and vomiting that is not controlled by your nausea medication, call the clinic.   BELOW ARE SYMPTOMS THAT SHOULD BE REPORTED IMMEDIATELY:  *FEVER GREATER THAN 100.5 F  *CHILLS WITH OR WITHOUT FEVER  NAUSEA AND VOMITING THAT IS NOT CONTROLLED WITH YOUR NAUSEA MEDICATION  *UNUSUAL SHORTNESS OF BREATH  *UNUSUAL BRUISING OR BLEEDING  TENDERNESS IN MOUTH AND THROAT WITH OR WITHOUT PRESENCE OF ULCERS  *URINARY PROBLEMS  *BOWEL PROBLEMS  UNUSUAL RASH Items with * indicate a potential emergency and should be followed up as soon as possible.  Feel free to call the clinic you have any questions or concerns. The clinic phone number is [phone].  Please show the [facility] at check-in to the Emergency Department and triage nurse.   Hyperkalemia Hyperkalemia is when you have too much potassium in your blood. Potassium is normally removed (excreted) from your body by your kidneys. If there is too much potassium in your blood, it can affect your heart's ability to function.  CAUSES  Hyperkalemia may be caused by:   Taking in too much potassium. You can do this by:  Using salt substitutes. They contain large amounts of potassium.  Taking potassium supplements.  Eating foods high in potassium.  Excreting too little potassium. This can happen if:  Your kidneys are not working properly. Kidney (renal) disease, including short- or long-term renal failure, is a very common cause of hyperkalemia.  You are taking medicines that lower your excretion of potassium.  You have Addison disease.  You have a urinary tract blockage, such as kidney stones.  You are on treatment to  mechanically clean your blood (dialysis) and you skip a treatment.  Releasing a high amount of potassium from your cells into your blood. This can happen with:  Injury to muscles (rhabdomyolysis) or other tissues. Most potassium is stored in your muscles.  Severe burns or infections.  Acidic blood plasma (acidosis). Acidosis can result from many diseases, such as uncontrolled diabetes. RISK FACTORS The most common risk factor of hyperkalemia is kidney disease. Other risk factors of hyperkalemia include:  Addison disease. This is a condition where your glands do not produce enough hormones.  Alcoholism or heavy drug use.   Using certain blood pressure medicines, such as angiotensin-converting enzyme (ACE) inhibitors, angiotensin II receptor blockers (ARBs), or potassium-sparing diuretics such as spironolactone.  Severe injury or burn. SIGNS AND SYMPTOMS  Oftentimes, there are no signs or symptoms of hyperkalemia. However, when your potassium level becomes high enough, you may experience symptoms such as:  Irregular or very slow heartbeat.  Nausea.  Fatigue.  Tingling of the skin or numbness of the hands or feet.  Muscle weakness.  Fatigue.  Not being able to move (paralysis). You may not have any symptoms of hyperkalemia.  DIAGNOSIS  Hyperkalemia may be diagnosed by:  Physical exam.  Blood tests.  ECG (electrocardiogram).  Discussion of prescription and non-prescription drug use. TREATMENT  Treatment for hyperkalemia is often directed at the underlying cause. In some instances, treatment may include:   Insulin.  Glucose (sugar) and water solution given through a vein (intravenous or IV).  Dialysis.  Medicines to remove the potassium from your body.  Medicines to move calcium from your bloodstream  into your tissues. HOME CARE INSTRUCTIONS   Take medicines only as directed by your health care provider.  Do not take any supplements, natural products,  herbs, or vitamins without reviewing them with your health care provider. Certain supplements and natural food products can have high amounts of potassium.  Limit your alcohol intake as directed by your health care provider.  Stop illegal drug use. If you need help quitting, ask your health care provider.  Keep all follow-up visits as directed by your health care provider. This is important.  If you have kidney disease, you may need to follow a low potassium diet. A dietitian can help educate you on low potassium foods. SEEK MEDICAL CARE IF:   You notice an irregular or very slow heartbeat.  You feel light-headed.  You feel weak.  You are nauseous.  You have tingling or numbness in your hands or feet. SEEK IMMEDIATE MEDICAL CARE IF:   You have shortness of breath.  You have chest pain or discomfort.  You pass out.  You have muscle paralysis. MAKE SURE YOU:   Understand these instructions.  Will watch your condition.  Will get help right away if you are not doing well or get worse.   This information is not intended to replace advice given to you by your health care provider. Make sure you discuss any questions you have with your health care provider.   Document Released: 01/22/2002 Document Revised: 02/22/2014 Document Reviewed: 05/09/2013 Elsevier Interactive Patient Education [employer].

## 2015-09-02 NOTE — Telephone Encounter (Signed)
Gave pt cal & avs °

## 2015-09-02 NOTE — Progress Notes (Signed)
Dr. Julien Nordmann called with potassium level 5.9, will call in Kayexalate to pharmacy. Okay to treat with today's lab values.

## 2015-09-02 NOTE — Progress Notes (Signed)
Nutrition follow-up completed with patient during chemotherapy for non-small cell lung cancer. Weight continues to improve and was documented as 177.1 pounds on July 18 increased from 172.8 pounds June 26. Patient reports he continues to eat well and is drinking Ensure Plus. Denies constipation or diarrhea. Nausea is controlled.  Nutrition diagnosis: Unintended weight loss resolved.  Provided patient with additional coupons for Ensure Plus and encouraged him to continue adequate oral intake. Patient will contact me for questions or concerns.  **Disclaimer: This note was dictated with voice recognition software. Similar sounding words can inadvertently be transcribed and this note may contain transcription errors which may not have been corrected upon publication of note.**

## 2015-09-02 NOTE — Addendum Note (Signed)
Addended by: Lucile Crater on: 09/02/2015 12:42 PM   Modules accepted: Orders

## 2015-09-02 NOTE — Progress Notes (Signed)
Quick Note:  Call patient with the result and repeat B MET in 2 days after Kayexalate ______

## 2015-09-03 ENCOUNTER — Other Ambulatory Visit: Payer: Self-pay | Admitting: Medical Oncology

## 2015-09-03 DIAGNOSIS — E875 Hyperkalemia: Secondary | ICD-10-CM

## 2015-09-04 ENCOUNTER — Ambulatory Visit (HOSPITAL_BASED_OUTPATIENT_CLINIC_OR_DEPARTMENT_OTHER): Payer: Medicare Other

## 2015-09-04 VITALS — BP 131/66 | HR 88 | Temp 98.0°F | Resp 20

## 2015-09-04 DIAGNOSIS — Z5189 Encounter for other specified aftercare: Secondary | ICD-10-CM

## 2015-09-04 DIAGNOSIS — C3492 Malignant neoplasm of unspecified part of left bronchus or lung: Secondary | ICD-10-CM

## 2015-09-04 DIAGNOSIS — C3432 Malignant neoplasm of lower lobe, left bronchus or lung: Secondary | ICD-10-CM | POA: Diagnosis not present

## 2015-09-04 MED ORDER — PEGFILGRASTIM INJECTION 6 MG/0.6ML ~~LOC~~
6.0000 mg | PREFILLED_SYRINGE | Freq: Once | SUBCUTANEOUS | Status: AC
  Administered 2015-09-04: 6 mg via SUBCUTANEOUS
  Filled 2015-09-04: qty 0.6

## 2015-09-04 NOTE — Patient Instructions (Signed)
Pegfilgrastim injection What is this medicine? PEGFILGRASTIM (PEG fil gra stim) is a long-acting granulocyte colony-stimulating factor that stimulates the growth of neutrophils, a type of white blood cell important in the body's fight against infection. It is used to reduce the incidence of fever and infection in patients with certain types of cancer who are receiving chemotherapy that affects the bone marrow, and to increase survival after being exposed to high doses of radiation. This medicine may be used for other purposes; ask your health care provider or pharmacist if you have questions. What should I tell my health care provider before I take this medicine? They need to know if you have any of these conditions: -kidney disease -latex allergy -ongoing radiation therapy -sickle cell disease -skin reactions to acrylic adhesives (On-Body Injector only) -an unusual or allergic reaction to pegfilgrastim, filgrastim, other medicines, foods, dyes, or preservatives -pregnant or trying to get pregnant -breast-feeding How should I use this medicine? This medicine is for injection under the skin. If you get this medicine at home, you will be taught how to prepare and give the pre-filled syringe or how to use the On-body Injector. Refer to the patient Instructions for Use for detailed instructions. Use exactly as directed. Take your medicine at regular intervals. Do not take your medicine more often than directed. It is important that you put your used needles and syringes in a special sharps container. Do not put them in a trash can. If you do not have a sharps container, call your pharmacist or healthcare provider to get one. Talk to your pediatrician regarding the use of this medicine in children. While this drug may be prescribed for selected conditions, precautions do apply. Overdosage: If you think you have taken too much of this medicine contact a poison control center or emergency room at  once. NOTE: This medicine is only for you. Do not share this medicine with others. What if I miss a dose? It is important not to miss your dose. Call your doctor or health care professional if you miss your dose. If you miss a dose due to an On-body Injector failure or leakage, a new dose should be administered as soon as possible using a single prefilled syringe for manual use. What may interact with this medicine? Interactions have not been studied. Give your health care provider a list of all the medicines, herbs, non-prescription drugs, or dietary supplements you use. Also tell them if you smoke, drink alcohol, or use illegal drugs. Some items may interact with your medicine. This list may not describe all possible interactions. Give your health care provider a list of all the medicines, herbs, non-prescription drugs, or dietary supplements you use. Also tell them if you smoke, drink alcohol, or use illegal drugs. Some items may interact with your medicine. What should I watch for while using this medicine? You may need blood work done while you are taking this medicine. If you are going to need a MRI, CT scan, or other procedure, tell your doctor that you are using this medicine (On-Body Injector only). What side effects may I notice from receiving this medicine? Side effects that you should report to your doctor or health care professional as soon as possible: -allergic reactions like skin rash, itching or hives, swelling of the face, lips, or tongue -dizziness -fever -pain, redness, or irritation at site where injected -pinpoint red spots on the skin -red or dark-brown urine -shortness of breath or breathing problems -stomach or side pain, or pain   at the shoulder -swelling -tiredness -trouble passing urine or change in the amount of urine Side effects that usually do not require medical attention (report to your doctor or health care professional if they continue or are  bothersome): -bone pain -muscle pain This list may not describe all possible side effects. Call your doctor for medical advice about side effects. You may report side effects to FDA at 1-800-FDA-1088. Where should I keep my medicine? Keep out of the reach of children. Store pre-filled syringes in a refrigerator between 2 and 8 degrees C (36 and 46 degrees F). Do not freeze. Keep in carton to protect from light. Throw away this medicine if it is left out of the refrigerator for more than 48 hours. Throw away any unused medicine after the expiration date. NOTE: This sheet is a summary. It may not cover all possible information. If you have questions about this medicine, talk to your doctor, pharmacist, or health care provider.    2016, [employer] Standard. (2014-02-21 14:30:14)  

## 2015-09-05 ENCOUNTER — Other Ambulatory Visit (HOSPITAL_BASED_OUTPATIENT_CLINIC_OR_DEPARTMENT_OTHER): Payer: Medicare Other

## 2015-09-05 DIAGNOSIS — D509 Iron deficiency anemia, unspecified: Secondary | ICD-10-CM

## 2015-09-05 DIAGNOSIS — E875 Hyperkalemia: Secondary | ICD-10-CM

## 2015-09-05 LAB — BASIC METABOLIC PANEL
Anion Gap: 10 mEq/L (ref 3–11)
BUN: 31.1 mg/dL — ABNORMAL HIGH (ref 7.0–26.0)
CALCIUM: 8.1 mg/dL — AB (ref 8.4–10.4)
CHLORIDE: 107 meq/L (ref 98–109)
CO2: 23 meq/L (ref 22–29)
Creatinine: 1.3 mg/dL (ref 0.7–1.3)
EGFR: 74 mL/min/{1.73_m2} — AB (ref >90)
Glucose: 203 mg/dl — ABNORMAL HIGH (ref 70–140)
POTASSIUM: 4.2 meq/L (ref 3.5–5.1)
SODIUM: 140 meq/L (ref 136–145)

## 2015-09-09 ENCOUNTER — Other Ambulatory Visit (HOSPITAL_BASED_OUTPATIENT_CLINIC_OR_DEPARTMENT_OTHER): Payer: Medicare Other

## 2015-09-09 DIAGNOSIS — C3492 Malignant neoplasm of unspecified part of left bronchus or lung: Secondary | ICD-10-CM

## 2015-09-09 DIAGNOSIS — C3432 Malignant neoplasm of lower lobe, left bronchus or lung: Secondary | ICD-10-CM | POA: Diagnosis not present

## 2015-09-09 LAB — CBC WITH DIFFERENTIAL/PLATELET
BASO%: 0.2 % (ref 0.0–2.0)
BASOS ABS: 0 10*3/uL (ref 0.0–0.1)
EOS%: 0.2 % (ref 0.0–7.0)
Eosinophils Absolute: 0 10*3/uL (ref 0.0–0.5)
HEMATOCRIT: 26 % — AB (ref 38.4–49.9)
HEMOGLOBIN: 8.6 g/dL — AB (ref 13.0–17.1)
LYMPH#: 1.2 10*3/uL (ref 0.9–3.3)
LYMPH%: 22.3 % (ref 14.0–49.0)
MCH: 23.3 pg — AB (ref 27.2–33.4)
MCHC: 33.1 g/dL (ref 32.0–36.0)
MCV: 70.5 fL — AB (ref 79.3–98.0)
MONO#: 1.2 10*3/uL — ABNORMAL HIGH (ref 0.1–0.9)
MONO%: 23.7 % — ABNORMAL HIGH (ref 0.0–14.0)
NEUT#: 2.8 10*3/uL (ref 1.5–6.5)
NEUT%: 53.6 % (ref 39.0–75.0)
Platelets: 76 10*3/uL — ABNORMAL LOW (ref 140–400)
RBC: 3.69 10*6/uL — ABNORMAL LOW (ref 4.20–5.82)
RDW: 16.7 % — ABNORMAL HIGH (ref 11.0–14.6)
WBC: 5.2 10*3/uL (ref 4.0–10.3)
nRBC: 0 % (ref 0–0)

## 2015-09-09 LAB — COMPREHENSIVE METABOLIC PANEL
ALBUMIN: 3.3 g/dL — AB (ref 3.5–5.0)
ALK PHOS: 106 U/L (ref 40–150)
ALT: 27 U/L (ref 0–55)
AST: 27 U/L (ref 5–34)
Anion Gap: 6 mEq/L (ref 3–11)
BUN: 16.7 mg/dL (ref 7.0–26.0)
CALCIUM: 8.7 mg/dL (ref 8.4–10.4)
CO2: 25 mEq/L (ref 22–29)
Chloride: 107 mEq/L (ref 98–109)
Creatinine: 1.2 mg/dL (ref 0.7–1.3)
EGFR: 81 mL/min/{1.73_m2} — AB (ref >90)
Glucose: 165 mg/dl — ABNORMAL HIGH (ref 70–140)
POTASSIUM: 5.4 meq/L — AB (ref 3.5–5.1)
SODIUM: 139 meq/L (ref 136–145)
Total Bilirubin: 0.35 mg/dL (ref 0.20–1.20)
Total Protein: 6.6 g/dL (ref 6.4–8.3)

## 2015-09-12 ENCOUNTER — Other Ambulatory Visit: Payer: Self-pay | Admitting: *Deleted

## 2015-09-12 DIAGNOSIS — C3492 Malignant neoplasm of unspecified part of left bronchus or lung: Secondary | ICD-10-CM

## 2015-09-12 MED ORDER — HYDROCODONE-ACETAMINOPHEN 5-325 MG PO TABS: 2.0000 | ORAL_TABLET | Freq: Four times a day (QID) | ORAL | 0 refills | Status: DC | PRN

## 2015-09-12 NOTE — Telephone Encounter (Signed)
Pt sister called with request for pain medication refill. Reviewed with MD, notified ready for pick up

## 2015-09-15 ENCOUNTER — Other Ambulatory Visit: Payer: Self-pay | Admitting: Medical Oncology

## 2015-09-15 DIAGNOSIS — C3490 Malignant neoplasm of unspecified part of unspecified bronchus or lung: Secondary | ICD-10-CM

## 2015-09-16 ENCOUNTER — Other Ambulatory Visit (HOSPITAL_BASED_OUTPATIENT_CLINIC_OR_DEPARTMENT_OTHER): Payer: Medicare Other

## 2015-09-16 DIAGNOSIS — C3490 Malignant neoplasm of unspecified part of unspecified bronchus or lung: Secondary | ICD-10-CM

## 2015-09-16 DIAGNOSIS — C3432 Malignant neoplasm of lower lobe, left bronchus or lung: Secondary | ICD-10-CM | POA: Diagnosis present

## 2015-09-16 LAB — CBC WITH DIFFERENTIAL/PLATELET
BASO%: 0.9 % (ref 0.0–2.0)
Basophils Absolute: 0.2 10*3/uL — ABNORMAL HIGH (ref 0.0–0.1)
EOS%: 0 % (ref 0.0–7.0)
Eosinophils Absolute: 0 10*3/uL (ref 0.0–0.5)
HCT: 30 % — ABNORMAL LOW (ref 38.4–49.9)
HEMOGLOBIN: 9.4 g/dL — AB (ref 13.0–17.1)
LYMPH%: 8.9 % — AB (ref 14.0–49.0)
MCH: 22.7 pg — ABNORMAL LOW (ref 27.2–33.4)
MCHC: 31.4 g/dL — ABNORMAL LOW (ref 32.0–36.0)
MCV: 72.2 fL — ABNORMAL LOW (ref 79.3–98.0)
MONO#: 1.3 10*3/uL — ABNORMAL HIGH (ref 0.1–0.9)
MONO%: 8.2 % (ref 0.0–14.0)
NEUT%: 82 % — ABNORMAL HIGH (ref 39.0–75.0)
NEUTROS ABS: 13.3 10*3/uL — AB (ref 1.5–6.5)
Platelets: 182 10*3/uL (ref 140–400)
RBC: 4.15 10*6/uL — AB (ref 4.20–5.82)
RDW: 16.6 % — AB (ref 11.0–14.6)
WBC: 16.3 10*3/uL — AB (ref 4.0–10.3)
lymph#: 1.5 10*3/uL (ref 0.9–3.3)

## 2015-09-16 LAB — COMPREHENSIVE METABOLIC PANEL
ALBUMIN: 3.3 g/dL — AB (ref 3.5–5.0)
ALT: 16 U/L (ref 0–55)
AST: 18 U/L (ref 5–34)
Alkaline Phosphatase: 140 U/L (ref 40–150)
Anion Gap: 9 mEq/L (ref 3–11)
BILIRUBIN TOTAL: 0.35 mg/dL (ref 0.20–1.20)
BUN: 18.4 mg/dL (ref 7.0–26.0)
CO2: 23 meq/L (ref 22–29)
CREATININE: 2.4 mg/dL — AB (ref 0.7–1.3)
Calcium: 9.7 mg/dL (ref 8.4–10.4)
Chloride: 105 mEq/L (ref 98–109)
EGFR: 34 mL/min/{1.73_m2} — AB (ref >90)
GLUCOSE: 240 mg/dL — AB (ref 70–140)
Potassium: 5.1 mEq/L (ref 3.5–5.1)
SODIUM: 137 meq/L (ref 136–145)
TOTAL PROTEIN: 7.4 g/dL (ref 6.4–8.3)

## 2015-09-23 ENCOUNTER — Telehealth: Payer: Self-pay | Admitting: *Deleted

## 2015-09-23 ENCOUNTER — Ambulatory Visit (HOSPITAL_BASED_OUTPATIENT_CLINIC_OR_DEPARTMENT_OTHER): Payer: Medicare Other

## 2015-09-23 ENCOUNTER — Ambulatory Visit (HOSPITAL_BASED_OUTPATIENT_CLINIC_OR_DEPARTMENT_OTHER): Payer: Medicare Other | Admitting: Internal Medicine

## 2015-09-23 ENCOUNTER — Other Ambulatory Visit: Payer: Self-pay | Admitting: Medical Oncology

## 2015-09-23 ENCOUNTER — Telehealth: Payer: Self-pay | Admitting: Internal Medicine

## 2015-09-23 ENCOUNTER — Encounter: Payer: Self-pay | Admitting: Internal Medicine

## 2015-09-23 ENCOUNTER — Other Ambulatory Visit (HOSPITAL_BASED_OUTPATIENT_CLINIC_OR_DEPARTMENT_OTHER): Payer: Medicare Other

## 2015-09-23 ENCOUNTER — Other Ambulatory Visit: Payer: Self-pay | Admitting: *Deleted

## 2015-09-23 VITALS — BP 147/81 | HR 94 | Temp 97.8°F | Resp 17 | Ht 71.0 in | Wt 173.8 lb

## 2015-09-23 DIAGNOSIS — Z79899 Other long term (current) drug therapy: Secondary | ICD-10-CM | POA: Diagnosis not present

## 2015-09-23 DIAGNOSIS — N289 Disorder of kidney and ureter, unspecified: Secondary | ICD-10-CM

## 2015-09-23 DIAGNOSIS — E875 Hyperkalemia: Secondary | ICD-10-CM | POA: Diagnosis not present

## 2015-09-23 DIAGNOSIS — Z5111 Encounter for antineoplastic chemotherapy: Secondary | ICD-10-CM

## 2015-09-23 DIAGNOSIS — C3432 Malignant neoplasm of lower lobe, left bronchus or lung: Secondary | ICD-10-CM

## 2015-09-23 DIAGNOSIS — Z95828 Presence of other vascular implants and grafts: Secondary | ICD-10-CM

## 2015-09-23 DIAGNOSIS — C3492 Malignant neoplasm of unspecified part of left bronchus or lung: Secondary | ICD-10-CM

## 2015-09-23 DIAGNOSIS — N182 Chronic kidney disease, stage 2 (mild): Secondary | ICD-10-CM

## 2015-09-23 LAB — CBC WITH DIFFERENTIAL/PLATELET
BASO%: 0.2 % (ref 0.0–2.0)
Basophils Absolute: 0 10*3/uL (ref 0.0–0.1)
EOS ABS: 0 10*3/uL (ref 0.0–0.5)
EOS%: 0 % (ref 0.0–7.0)
HCT: 30.8 % — ABNORMAL LOW (ref 38.4–49.9)
HEMOGLOBIN: 9.5 g/dL — AB (ref 13.0–17.1)
LYMPH%: 7.3 % — ABNORMAL LOW (ref 14.0–49.0)
MCH: 22.3 pg — ABNORMAL LOW (ref 27.2–33.4)
MCHC: 30.9 g/dL — ABNORMAL LOW (ref 32.0–36.0)
MCV: 72.2 fL — AB (ref 79.3–98.0)
MONO#: 1 10*3/uL — ABNORMAL HIGH (ref 0.1–0.9)
MONO%: 4.5 % (ref 0.0–14.0)
NEUT%: 88 % — ABNORMAL HIGH (ref 39.0–75.0)
NEUTROS ABS: 19.2 10*3/uL — AB (ref 1.5–6.5)
Platelets: 482 10*3/uL — ABNORMAL HIGH (ref 140–400)
RBC: 4.27 10*6/uL (ref 4.20–5.82)
RDW: 17 % — AB (ref 11.0–14.6)
WBC: 21.7 10*3/uL — AB (ref 4.0–10.3)
lymph#: 1.6 10*3/uL (ref 0.9–3.3)

## 2015-09-23 LAB — COMPREHENSIVE METABOLIC PANEL
ALBUMIN: 3.7 g/dL (ref 3.5–5.0)
ALK PHOS: 106 U/L (ref 40–150)
ALT: 15 U/L (ref 0–55)
ANION GAP: 8 meq/L (ref 3–11)
AST: 17 U/L (ref 5–34)
BUN: 31.5 mg/dL — AB (ref 7.0–26.0)
CALCIUM: 10 mg/dL (ref 8.4–10.4)
CHLORIDE: 110 meq/L — AB (ref 98–109)
CO2: 19 mEq/L — ABNORMAL LOW (ref 22–29)
Creatinine: 2.1 mg/dL — ABNORMAL HIGH (ref 0.7–1.3)
EGFR: 39 mL/min/{1.73_m2} — AB (ref >90)
Glucose: 242 mg/dl — ABNORMAL HIGH (ref 70–140)
Sodium: 137 mEq/L (ref 136–145)
Total Bilirubin: <0.3 mg/dL (ref 0.20–1.20)
Total Protein: 7.8 g/dL (ref 6.4–8.3)

## 2015-09-23 LAB — UA PROTEIN, DIPSTICK - CHCC: PROTEIN: NEGATIVE mg/dL

## 2015-09-23 MED ORDER — SODIUM POLYSTYRENE SULFONATE 15 GM/60ML PO SUSP: 30.0000 g | Freq: Once | ORAL | Status: DC

## 2015-09-23 MED ORDER — HEPARIN SOD (PORK) LOCK FLUSH 100 UNIT/ML IV SOLN
500.0000 [IU] | Freq: Once | INTRAVENOUS | Status: AC
  Administered 2015-09-23: 500 [IU] via INTRAVENOUS
  Filled 2015-09-23: qty 5

## 2015-09-23 MED ORDER — SODIUM POLYSTYRENE SULFONATE 15 GM/60ML PO SUSP: 30.0000 g | Freq: Once | ORAL | 0 refills | Status: AC

## 2015-09-23 MED ORDER — SODIUM CHLORIDE 0.9% FLUSH
10.0000 mL | INTRAVENOUS | Status: DC | PRN
  Administered 2015-09-23: 10 mL via INTRAVENOUS
  Filled 2015-09-23: qty 10

## 2015-09-23 MED ORDER — SODIUM CHLORIDE 0.9 % IV SOLN
INTRAVENOUS | Status: AC
  Administered 2015-09-23: 11:00:00 via INTRAVENOUS

## 2015-09-23 NOTE — Patient Instructions (Signed)

## 2015-09-23 NOTE — Telephone Encounter (Signed)
I left messge for wife that kayexalate called in to local pharmacy

## 2015-09-23 NOTE — Progress Notes (Signed)
Kayexalate called to local pharmacy.

## 2015-09-23 NOTE — Telephone Encounter (Signed)
Gave pt cal & avs °

## 2015-09-23 NOTE — Progress Notes (Signed)
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  PRIOR THERAPY:  1) Systemic chemotherapy with carboplatin for AUC of 5 given on day 1 and gemcitabine 1000 MG/M2 on days 1 and 8 every 3 weeks. Status post 6 cycles with partial response. 2) Nivolumab 240 MG IV every 2 weeks status post 4 cycles, last dose was given 03/18/2015 discontinued today secondary to disease progression.  CURRENT THERAPY: Systemic chemotherapy with docetaxel 75 MG/M2 and Cyramza 10 MG/KG every 3 weeks, first dose 04/08/2015. Status post 8 cycles.  CODE STATUS: Full code initially but no prolonged resuscitation.   INTERVAL HISTORY: Keith Holloway 55 y.o. male returns to the clinic today for follow-up visit accompanied by his wife. The patient is feeling fine today with no specific complaints except for low back pain. He has drainage of an abscess in the lower back recently by surgery. He tolerated the last cycle of his systemic chemotherapy with docetaxel and Cyramza fairly well. He denied having any significant chest pain, shortness of breath or hemoptysis. The patient denied having any fever or chills. He has no nausea or vomiting. The patient denied having any bleeding issues. He is here today to start cycle #9.  MEDICAL HISTORY: Past Medical History:  Diagnosis Date  . Alcohol abuse   . Arthritis   . Cancer ([facility]) dx'd 04/2014   Lung Cancer  . CHF (congestive heart failure) ([place])   . Depression   . Diabetes mellitus without complication ([facility])   . High cholesterol   . Hypertension   . Insomnia 05/20/2015  . Legally blind   . Memory disorder 08/22/2013  . Migraine   . Polyneuropathy in diabetes(357.2)   . Polysubstance abuse    alcohol and cocaine  . Skin infection  06/10/2015  . Tobacco abuse counseling 10/09/2014    ALLERGIES:  has No Known Allergies.  MEDICATIONS:  Current Outpatient Prescriptions  Medication Sig Dispense Refill  . aspirin EC 81 MG tablet Take 1 tablet by mouth daily.    . benzonatate (TESSALON) 100 MG capsule TAKE ONE CAPSULE EVERY 8 HOURS 21 capsule 0  . dexamethasone (DECADRON) 4 MG tablet 2 tablets by mouth twice a day the day before, day of and day after the chemotherapy every 3 weeks 40 tablet 1  . dexamethasone (DECADRON) 4 MG tablet 2 TABLETS BY MOUTH TWICE A DAY THE DAY BEFORE, DAY OF AND DAY AFTER THE CHEMOTHERAPY EVERY 3 WEEKS 40 tablet 1  . dextrose ([facility] GLUCOSE) 40 % GEL Take by mouth.    . Ferrous Sulfate (IRON) 325 (65 Fe) MG TABS Take 1 tablet by mouth daily.    Marland Kitchen HYDROcodone-acetaminophen (NORCO/VICODIN) 5-325 MG tablet Take 2 tablets by mouth every 6 (six) hours as needed. 60 tablet 0  . insulin NPH-regular Human (NOVOLIN 70/30) (70-30) 100 UNIT/ML injection Inject 35 Units into the skin 2 (two) times daily with a meal. 10 mL 11  . lidocaine-prilocaine (EMLA) cream Apply 1 application topically as needed. 30 g 0  . losartan-hydrochlorothiazide (HYZAAR) 100-25 MG per tablet Take 1 tablet by mouth daily.  2  . metFORMIN (GLUCOPHAGE) 1000 MG tablet Take 1 tablet (1,000 mg total) by mouth 2 (two) times daily. 60 tablet 12  . ondansetron (ZOFRAN) 8 MG tablet  TAKE 1 TABLET EVERY 8 HOURS AS NEEDED FOR NAUSEA AND VOMITING 20 tablet 1  . prochlorperazine (COMPAZINE) 10 MG tablet TAKE 1 TABLET (10 MG TOTAL) BY MOUTH EVERY 6 (SIX) HOURS AS NEEDED FOR NAUSEA OR VOMITING. 30 tablet 0  . simvastatin (ZOCOR) 40 MG tablet Take 1 tablet (40 mg total) by mouth daily. 30 tablet 3  . sodium polystyrene (KAYEXALATE) 15 GM/60ML suspension Take 120 mLs (30 g total) by mouth once. 120 mL 0  . sulfamethoxazole-trimethoprim (BACTRIM DS,SEPTRA DS) 800-160 MG tablet     . temazepam (RESTORIL) 30 MG capsule TAKE ONE CAPSULE AT BEDTIME 30 capsule  0  . vitamin C (ASCORBIC ACID) 500 MG tablet Take 500 mg by mouth daily.     No current facility-administered medications for this visit.     SURGICAL HISTORY:  Past Surgical History:  Procedure Laterality Date  . CATARACT EXTRACTION     bilaterally  . EYE SURGERY  04/2012   detatched retna, bilateral  . EYE SURGERY  08/2012  . retinol detachment Bilateral 2014    REVIEW OF SYSTEMS:  A comprehensive review of systems was negative except for: Constitutional: positive for fatigue Musculoskeletal: positive for back pain   PHYSICAL EXAMINATION: General appearance: alert, cooperative, fatigued and no distress Head: Normocephalic, without obvious abnormality, atraumatic Neck: no adenopathy, no JVD, supple, symmetrical, trachea midline and thyroid not enlarged, symmetric, no tenderness/mass/nodules Lymph nodes: Cervical, supraclavicular, and axillary nodes normal. Resp: clear to auscultation bilaterally Back: symmetric, no curvature. ROM normal. No CVA tenderness. Cardio: regular rate and rhythm, S1, S2 normal, no murmur, click, rub or gallop GI: soft, non-tender; bowel sounds normal; no masses,  no organomegaly Extremities: extremities normal, atraumatic, no cyanosis or edema Neurologic: Alert and oriented X 3, normal strength and tone. Normal symmetric reflexes. Normal coordination and gait  ECOG PERFORMANCE STATUS: 1 - Symptomatic but completely ambulatory  Blood pressure (!) 147/81, pulse 94, temperature 97.8 F (36.6 C), temperature source Oral, resp. rate 17, height '5\' 11"'$  (1.803 m), weight 173 lb 12.8 oz (78.8 kg), SpO2 100 %.  LABORATORY DATA: Lab Results  Component Value Date   WBC 16.3 (H) 09/16/2015   HGB 9.4 (L) 09/16/2015   HCT 30.0 (L) 09/16/2015   MCV 72.2 (L) 09/16/2015   PLT 182 09/16/2015      Chemistry      Component Value Date/Time   NA 137 09/16/2015 1133   K 5.1 09/16/2015 1133   CL 106 01/30/2015 1330   CO2 23 09/16/2015 1133   BUN 18.4 09/16/2015  1133   CREATININE 2.4 (H) 09/16/2015 1133      Component Value Date/Time   CALCIUM 9.7 09/16/2015 1133   ALKPHOS 140 09/16/2015 1133   AST 18 09/16/2015 1133   ALT 16 09/16/2015 1133   BILITOT 0.35 09/16/2015 1133       RADIOGRAPHIC STUDIES: No results found.  ASSESSMENT AND PLAN: This is a very pleasant 55 years old African-American male with a stage IV non-small cell lung cancer, squamous cell carcinoma who completed systemic chemotherapy with carboplatin and gemcitabine status post 6 cycles. The patient has tolerated his treatment fairly well with no significant adverse effects except for mild fatigue and nausea. He had evidence for disease progression and the patient was started on treatment with immunotherapy with Nivolumab currently status post 4 cycles. Unfortunately the recent CT scan of the chest, abdomen and pelvis showed interval progression of his disease with increase in the size of the dominant anterior mediastinal  mass and left lower lobe perihilar mass. There was also development of new subcarinal adenopathy and multiple new pulmonary nodules as well as questionable liver lesion. He is currently undergoing systemic chemotherapy with docetaxel and Cyramza status post 8 cycles. He is tolerating his treatment fairly well. His commencement panel showed elevated serum creatinine as well as elevated serum potassium and blood glucose. I will delay the start of cycle #9 x 1 week. For insomnia, he will continue on Restoril 30 mg by mouth daily at bedtime when necessary. He would come back for follow-up visit in 4 weeks for reevaluation before starting cycle #10 after repeating CT scan of the chest, abdomen and pelvis without contrast for restaging of his disease. For the hyperkalemia, I will start the patient on Kayexalate today. For the renal insufficiency, I will arrange for the patient to receive 1 L of normal saline today. He was advised to call immediately if he has any  concerning symptoms in the interval. The patient voices understanding of current disease status and treatment options and is in agreement with the current care plan.  All questions were answered. The patient knows to call the clinic with any problems, questions or concerns. We can certainly see the patient much sooner if necessary.  Disclaimer: This note was dictated with voice recognition software. Similar sounding words can inadvertently be transcribed and may not be corrected upon review.

## 2015-09-23 NOTE — Telephone Encounter (Signed)
Per staff phone call and POF I have schedueld appts. Scheduler advised of appts.  JMW  

## 2015-09-25 ENCOUNTER — Ambulatory Visit: Payer: Medicare Other

## 2015-09-30 ENCOUNTER — Encounter (HOSPITAL_COMMUNITY): Payer: Self-pay

## 2015-09-30 ENCOUNTER — Ambulatory Visit: Payer: Medicare Other

## 2015-09-30 ENCOUNTER — Other Ambulatory Visit (HOSPITAL_BASED_OUTPATIENT_CLINIC_OR_DEPARTMENT_OTHER): Payer: Medicare Other

## 2015-09-30 ENCOUNTER — Other Ambulatory Visit: Payer: Self-pay

## 2015-09-30 ENCOUNTER — Other Ambulatory Visit: Payer: Medicare Other

## 2015-09-30 ENCOUNTER — Inpatient Hospital Stay (HOSPITAL_COMMUNITY)
Admission: EM | Admit: 2015-09-30 | Discharge: 2015-10-02 | DRG: 641 | Disposition: A | Discharge status: Home / Self Care | Payer: Medicare Other | Attending: Internal Medicine | Admitting: Internal Medicine

## 2015-09-30 DIAGNOSIS — F1410 Cocaine abuse, uncomplicated: Secondary | ICD-10-CM | POA: Diagnosis present

## 2015-09-30 DIAGNOSIS — R6510 Systemic inflammatory response syndrome (SIRS) of non-infectious origin without acute organ dysfunction: Secondary | ICD-10-CM

## 2015-09-30 DIAGNOSIS — Z8249 Family history of ischemic heart disease and other diseases of the circulatory system: Secondary | ICD-10-CM

## 2015-09-30 DIAGNOSIS — C3492 Malignant neoplasm of unspecified part of left bronchus or lung: Secondary | ICD-10-CM | POA: Diagnosis not present

## 2015-09-30 DIAGNOSIS — E785 Hyperlipidemia, unspecified: Secondary | ICD-10-CM | POA: Diagnosis present

## 2015-09-30 DIAGNOSIS — Z5111 Encounter for antineoplastic chemotherapy: Secondary | ICD-10-CM

## 2015-09-30 DIAGNOSIS — H548 Legal blindness, as defined in USA: Secondary | ICD-10-CM | POA: Diagnosis present

## 2015-09-30 DIAGNOSIS — E11319 Type 2 diabetes mellitus with unspecified diabetic retinopathy without macular edema: Secondary | ICD-10-CM | POA: Diagnosis present

## 2015-09-30 DIAGNOSIS — C3432 Malignant neoplasm of lower lobe, left bronchus or lung: Secondary | ICD-10-CM

## 2015-09-30 DIAGNOSIS — A419 Sepsis, unspecified organism: Secondary | ICD-10-CM

## 2015-09-30 DIAGNOSIS — N179 Acute kidney failure, unspecified: Secondary | ICD-10-CM

## 2015-09-30 DIAGNOSIS — C3490 Malignant neoplasm of unspecified part of unspecified bronchus or lung: Secondary | ICD-10-CM | POA: Diagnosis present

## 2015-09-30 DIAGNOSIS — N183 Chronic kidney disease, stage 3 (moderate): Secondary | ICD-10-CM | POA: Diagnosis present

## 2015-09-30 DIAGNOSIS — F17210 Nicotine dependence, cigarettes, uncomplicated: Secondary | ICD-10-CM | POA: Diagnosis present

## 2015-09-30 DIAGNOSIS — E1122 Type 2 diabetes mellitus with diabetic chronic kidney disease: Secondary | ICD-10-CM | POA: Diagnosis present

## 2015-09-30 DIAGNOSIS — I129 Hypertensive chronic kidney disease with stage 1 through stage 4 chronic kidney disease, or unspecified chronic kidney disease: Secondary | ICD-10-CM | POA: Diagnosis present

## 2015-09-30 DIAGNOSIS — N182 Chronic kidney disease, stage 2 (mild): Secondary | ICD-10-CM

## 2015-09-30 DIAGNOSIS — E875 Hyperkalemia: Principal | ICD-10-CM | POA: Diagnosis present

## 2015-09-30 DIAGNOSIS — Z7982 Long term (current) use of aspirin: Secondary | ICD-10-CM

## 2015-09-30 DIAGNOSIS — E1139 Type 2 diabetes mellitus with other diabetic ophthalmic complication: Secondary | ICD-10-CM

## 2015-09-30 DIAGNOSIS — Z79891 Long term (current) use of opiate analgesic: Secondary | ICD-10-CM

## 2015-09-30 DIAGNOSIS — Z9221 Personal history of antineoplastic chemotherapy: Secondary | ICD-10-CM

## 2015-09-30 DIAGNOSIS — I10 Essential (primary) hypertension: Secondary | ICD-10-CM | POA: Diagnosis not present

## 2015-09-30 DIAGNOSIS — Z809 Family history of malignant neoplasm, unspecified: Secondary | ICD-10-CM

## 2015-09-30 DIAGNOSIS — E7800 Pure hypercholesterolemia, unspecified: Secondary | ICD-10-CM | POA: Diagnosis present

## 2015-09-30 DIAGNOSIS — Z794 Long term (current) use of insulin: Secondary | ICD-10-CM

## 2015-09-30 DIAGNOSIS — E1142 Type 2 diabetes mellitus with diabetic polyneuropathy: Secondary | ICD-10-CM | POA: Diagnosis present

## 2015-09-30 DIAGNOSIS — Z820 Family history of epilepsy and other diseases of the nervous system: Secondary | ICD-10-CM

## 2015-09-30 DIAGNOSIS — Z833 Family history of diabetes mellitus: Secondary | ICD-10-CM

## 2015-09-30 LAB — GLUCOSE, CAPILLARY
Glucose-Capillary: 394 mg/dL — ABNORMAL HIGH (ref 65–99)
Glucose-Capillary: 479 mg/dL — ABNORMAL HIGH (ref 65–99)

## 2015-09-30 LAB — CBC WITH DIFFERENTIAL/PLATELET
BASO%: 0.7 % (ref 0.0–2.0)
BASOS ABS: 0.1 10*3/uL (ref 0.0–0.1)
EOS ABS: 0 10*3/uL (ref 0.0–0.5)
EOS%: 0.1 % (ref 0.0–7.0)
HCT: 32.5 % — ABNORMAL LOW (ref 38.4–49.9)
HEMOGLOBIN: 10.1 g/dL — AB (ref 13.0–17.1)
LYMPH%: 6 % — ABNORMAL LOW (ref 14.0–49.0)
MCH: 22.8 pg — ABNORMAL LOW (ref 27.2–33.4)
MCHC: 31.1 g/dL — ABNORMAL LOW (ref 32.0–36.0)
MCV: 73.1 fL — AB (ref 79.3–98.0)
MONO#: 0.4 10*3/uL (ref 0.1–0.9)
MONO%: 3.1 % (ref 0.0–14.0)
NEUT%: 90.1 % — ABNORMAL HIGH (ref 39.0–75.0)
NEUTROS ABS: 12.6 10*3/uL — AB (ref 1.5–6.5)
PLATELETS: 352 10*3/uL (ref 140–400)
RBC: 4.44 10*6/uL (ref 4.20–5.82)
RDW: 19.4 % — AB (ref 11.0–14.6)
WBC: 13.9 10*3/uL — AB (ref 4.0–10.3)
lymph#: 0.8 10*3/uL — ABNORMAL LOW (ref 0.9–3.3)

## 2015-09-30 LAB — CREATININE, SERUM
Creatinine, Ser: 1.78 mg/dL — ABNORMAL HIGH (ref 0.61–1.24)
GFR calc non Af Amer: 41 mL/min — ABNORMAL LOW (ref >60)
GFR, EST AFRICAN AMERICAN: 48 mL/min — AB (ref >60)

## 2015-09-30 LAB — PROTIME-INR
INR: 1.16
PROTHROMBIN TIME: 14.8 s (ref 11.4–15.2)

## 2015-09-30 LAB — APTT: aPTT: 34 seconds (ref 24–36)

## 2015-09-30 LAB — POTASSIUM: Potassium: 6.9 mmol/L (ref 3.5–5.1)

## 2015-09-30 LAB — COMPREHENSIVE METABOLIC PANEL
ALBUMIN: 3.3 g/dL — AB (ref 3.5–5.0)
ALK PHOS: 85 U/L (ref 40–150)
ALT: 16 U/L (ref 0–55)
AST: 15 U/L (ref 5–34)
Anion Gap: 4 mEq/L (ref 3–11)
BUN: 24.7 mg/dL (ref 7.0–26.0)
CHLORIDE: 111 meq/L — AB (ref 98–109)
CO2: 19 mEq/L — ABNORMAL LOW (ref 22–29)
Calcium: 9.1 mg/dL (ref 8.4–10.4)
Creatinine: 1.7 mg/dL — ABNORMAL HIGH (ref 0.7–1.3)
EGFR: 51 mL/min/{1.73_m2} — ABNORMAL LOW (ref >90)
GLUCOSE: 328 mg/dL — AB (ref 70–140)
Sodium: 134 mEq/L — ABNORMAL LOW (ref 136–145)
Total Bilirubin: <0.3 mg/dL (ref 0.20–1.20)
Total Protein: 6.7 g/dL (ref 6.4–8.3)

## 2015-09-30 LAB — CBC
HEMATOCRIT: 24.5 % — AB (ref 39.0–52.0)
Hemoglobin: 8.3 g/dL — ABNORMAL LOW (ref 13.0–17.0)
MCH: 23.7 pg — ABNORMAL LOW (ref 26.0–34.0)
MCHC: 33.9 g/dL (ref 30.0–36.0)
MCV: 70 fL — AB (ref 78.0–100.0)
PLATELETS: 305 10*3/uL (ref 150–400)
RBC: 3.5 MIL/uL — AB (ref 4.22–5.81)
RDW: 18.5 % — ABNORMAL HIGH (ref 11.5–15.5)
WBC: 14.1 10*3/uL — AB (ref 4.0–10.5)

## 2015-09-30 LAB — MRSA PCR SCREENING: MRSA by PCR: NEGATIVE

## 2015-09-30 LAB — CK: Total CK: 116 U/L (ref 49–397)

## 2015-09-30 LAB — URIC ACID: Uric Acid, Serum: 5.7 mg/dL (ref 4.4–7.6)

## 2015-09-30 LAB — LACTIC ACID, PLASMA: LACTIC ACID, VENOUS: 5.6 mmol/L — AB (ref 0.5–1.9)

## 2015-09-30 LAB — PROCALCITONIN

## 2015-09-30 LAB — PHOSPHORUS: Phosphorus: 2.4 mg/dL — ABNORMAL LOW (ref 2.5–4.6)

## 2015-09-30 LAB — MAGNESIUM: Magnesium: 1.7 mg/dL (ref 1.7–2.4)

## 2015-09-30 MED ORDER — FERROUS SULFATE 325 (65 FE) MG PO TABS
325.0000 mg | ORAL_TABLET | Freq: Every morning | ORAL | Status: DC
  Administered 2015-10-01 – 2015-10-02 (×2): 325 mg via ORAL
  Filled 2015-09-30 (×2): qty 1

## 2015-09-30 MED ORDER — SODIUM CHLORIDE 0.9 % IV BOLUS (SEPSIS)
1000.0000 mL | Freq: Once | INTRAVENOUS | Status: AC
  Administered 2015-09-30: 1000 mL via INTRAVENOUS

## 2015-09-30 MED ORDER — DEXTROSE 50 % IV SOLN
1.0000 | Freq: Once | INTRAVENOUS | Status: AC
  Administered 2015-09-30: 50 mL via INTRAVENOUS
  Filled 2015-09-30: qty 50

## 2015-09-30 MED ORDER — INSULIN ASPART 100 UNIT/ML ~~LOC~~ SOLN
10.0000 [IU] | Freq: Once | SUBCUTANEOUS | Status: AC
  Administered 2015-09-30: 10 [IU] via INTRAVENOUS
  Filled 2015-09-30: qty 0.1

## 2015-09-30 MED ORDER — SODIUM CHLORIDE 0.9 % IV SOLN
1.0000 g | Freq: Once | INTRAVENOUS | Status: AC
  Administered 2015-09-30: 1 g via INTRAVENOUS
  Filled 2015-09-30: qty 10

## 2015-09-30 MED ORDER — HEPARIN SODIUM (PORCINE) 5000 UNIT/ML IJ SOLN
5000.0000 [IU] | Freq: Three times a day (TID) | INTRAMUSCULAR | Status: DC
  Administered 2015-09-30 – 2015-10-02 (×5): 5000 [IU] via SUBCUTANEOUS
  Filled 2015-09-30 (×5): qty 1

## 2015-09-30 MED ORDER — ALBUTEROL (5 MG/ML) CONTINUOUS INHALATION SOLN
20.0000 mg/h | INHALATION_SOLUTION | Freq: Once | RESPIRATORY_TRACT | Status: AC
  Administered 2015-09-30: 20 mg/h via RESPIRATORY_TRACT
  Filled 2015-09-30: qty 20

## 2015-09-30 MED ORDER — SODIUM CHLORIDE 0.9% FLUSH
10.0000 mL | INTRAVENOUS | Status: DC | PRN
  Administered 2015-09-30 – 2015-10-01 (×4): 10 mL
  Filled 2015-09-30 (×4): qty 40

## 2015-09-30 MED ORDER — ACETAMINOPHEN 650 MG RE SUPP: 650.0000 mg | Freq: Four times a day (QID) | RECTAL | Status: DC | PRN

## 2015-09-30 MED ORDER — SODIUM POLYSTYRENE SULFONATE 15 GM/60ML PO SUSP
30.0000 g | Freq: Once | ORAL | Status: AC
  Administered 2015-09-30: 30 g via ORAL
  Filled 2015-09-30: qty 120

## 2015-09-30 MED ORDER — SODIUM CHLORIDE 0.9 % IV SOLN
INTRAVENOUS | Status: DC
  Administered 2015-09-30 – 2015-10-02 (×4): via INTRAVENOUS

## 2015-09-30 MED ORDER — HYDROCODONE-ACETAMINOPHEN 5-325 MG PO TABS
2.0000 | ORAL_TABLET | Freq: Four times a day (QID) | ORAL | Status: DC | PRN
  Administered 2015-10-02: 2 via ORAL
  Filled 2015-09-30: qty 2

## 2015-09-30 MED ORDER — INSULIN ASPART 100 UNIT/ML ~~LOC~~ SOLN
10.0000 [IU] | Freq: Once | SUBCUTANEOUS | Status: AC
  Administered 2015-09-30: 10 [IU] via INTRAVENOUS
  Filled 2015-09-30: qty 1

## 2015-09-30 MED ORDER — SIMVASTATIN 40 MG PO TABS
40.0000 mg | ORAL_TABLET | Freq: Every day | ORAL | Status: DC
  Administered 2015-09-30 – 2015-10-02 (×3): 40 mg via ORAL
  Filled 2015-09-30 (×3): qty 1

## 2015-09-30 MED ORDER — INSULIN ASPART 100 UNIT/ML ~~LOC~~ SOLN
8.0000 [IU] | Freq: Once | SUBCUTANEOUS | Status: AC
  Administered 2015-10-01: 8 [IU] via SUBCUTANEOUS

## 2015-09-30 MED ORDER — INSULIN ASPART PROT & ASPART (70-30 MIX) 100 UNIT/ML ~~LOC~~ SUSP
15.0000 [IU] | Freq: Two times a day (BID) | SUBCUTANEOUS | Status: DC
  Administered 2015-10-01 – 2015-10-02 (×3): 15 [IU] via SUBCUTANEOUS
  Filled 2015-09-30: qty 10

## 2015-09-30 MED ORDER — INSULIN ASPART 100 UNIT/ML ~~LOC~~ SOLN
0.0000 [IU] | Freq: Three times a day (TID) | SUBCUTANEOUS | Status: DC
  Administered 2015-10-01: 3 [IU] via SUBCUTANEOUS

## 2015-09-30 MED ORDER — INSULIN ASPART 100 UNIT/ML ~~LOC~~ SOLN
0.0000 [IU] | Freq: Every day | SUBCUTANEOUS | Status: DC
  Administered 2015-09-30: 5 [IU] via SUBCUTANEOUS

## 2015-09-30 MED ORDER — ONDANSETRON HCL 4 MG/2ML IJ SOLN: 4.0000 mg | Freq: Four times a day (QID) | INTRAMUSCULAR | Status: DC | PRN

## 2015-09-30 MED ORDER — LORAZEPAM 2 MG/ML IJ SOLN: 1.0000 mg | INTRAMUSCULAR | Status: DC | PRN

## 2015-09-30 MED ORDER — ASPIRIN EC 81 MG PO TBEC
81.0000 mg | DELAYED_RELEASE_TABLET | Freq: Every morning | ORAL | Status: DC
  Administered 2015-10-01 – 2015-10-02 (×2): 81 mg via ORAL
  Filled 2015-09-30 (×2): qty 1

## 2015-09-30 MED ORDER — SODIUM CHLORIDE 0.9 % IV SOLN
1.0000 g | Freq: Once | INTRAVENOUS | Status: AC
  Administered 2015-09-30: 1 g via INTRAVENOUS
  Filled 2015-09-30 (×2): qty 10

## 2015-09-30 MED ORDER — ACETAMINOPHEN 325 MG PO TABS
650.0000 mg | ORAL_TABLET | Freq: Four times a day (QID) | ORAL | Status: DC | PRN
Start: 2015-09-30 — End: 2015-10-02

## 2015-09-30 MED ORDER — ONDANSETRON HCL 4 MG PO TABS: 4.0000 mg | ORAL_TABLET | Freq: Four times a day (QID) | ORAL | Status: DC | PRN

## 2015-09-30 MED ORDER — VITAMIN C 500 MG PO TABS
500.0000 mg | ORAL_TABLET | Freq: Every day | ORAL | Status: DC
  Administered 2015-10-01: 500 mg via ORAL
  Filled 2015-09-30: qty 1

## 2015-09-30 NOTE — ED Notes (Signed)
I cannot start the 20 min timer yet due to charge nurse starting blood and will call us back. Jessica.

## 2015-09-30 NOTE — Progress Notes (Signed)
CRITICAL VALUE ALERT  Critical value received:  K+ 6.9, Lactic acid 5.6  Date of notification:  09/30/15   Time of notification:  2101  Critical value read back:Yes.    Nurse who received alert:  Mena Pauls   MD notified (1st page): Baltazar Najjar   Time of first page: 2110  MD notified (2nd page):  Time of second page:  Responding MD:  Baltazar Najjar  Time MD responded:  2115

## 2015-09-30 NOTE — Progress Notes (Signed)
10 units of insulin given IV, 5u insulin given SubQ, 1m of 50% dextrose IV. CBG rechecked 2 hrs after and it was 394. NP made aware. Will continue to monitor pt closely and will carry out any new orders.

## 2015-09-30 NOTE — ED Notes (Signed)
Patient hooked up to cardiac monitor. Patient's port previously accessed by cancer center. Patient is alert,and oriented.

## 2015-09-30 NOTE — H&P (Signed)
History and Physical:    Keith Holloway   FWY:[id number] DOB: 11/08/1960 DOA: 09/30/2015  Referring MD/provider: Gareth Morgan, MD PCP: [name], MD   Patient coming from: Home/direct referral from cancer center  Chief Complaint: Abnormal labs  History of Present Illness:   Keith Holloway is an 55 y.o. male with a PMH of non-small cell lung cancer, diagnosed April 2016, status post chemotherapy and immunotherapy with disease progression, under the care of Dr. Julien Nordmann, who presented to the clinic for a follow-up visit today for routine labs to be done prior to receiving chemotherapy with docetaxel and Cyramza, but the scheduled dose of chemotherapy was held secondary to the finding of an elevated potassium level of 8.0.  He was transported to the ED for further evaluation. The patient endorses some fatigue/weakness, occasional shortness of breath, but no other significant symptoms. The patient admits to cocaine use. Last used last week.  ED Course:12-lead EKG showed peaked T waves. He was given an amp of glucose, 10 units of insulin, an amp of calcium gluconate, and a dose of Kayexalate and was subsequently referred for admission.  ROS:   Review of Systems  Constitutional: Positive for malaise/fatigue. Negative for chills, fever and weight loss.  HENT: Positive for nosebleeds. Negative for congestion and sore throat.   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 Negative for cough.   Cardiovascular: Negative for chest pain and palpitations.  Gastrointestinal: Negative for blood in stool, diarrhea, melena, nausea and vomiting.  Genitourinary: Negative.   Musculoskeletal: Positive for falls. Negative for joint pain and myalgias.       Fell after he got dizzy when blood sugar was low.  Skin: Positive for itching. Negative for rash.       Boil on left hip, recently had I&D done.  Neurological: Positive for dizziness and  weakness. Negative for headaches.  Endo/Heme/Allergies: Does not bruise/bleed easily.  Psychiatric/Behavioral: Negative.     All other systems were reviewed are are negative. Past Medical History:   Past Medical History:  Diagnosis Date  . Alcohol abuse   . Arthritis   . Cancer ([place]) dx'd 04/2014   Lung Cancer  . CHF (congestive heart failure) ([place])   . Depression   . Diabetes mellitus without complication ([place])   . High cholesterol   . Hypertension   . Insomnia 05/20/2015  . Legally blind   . Memory disorder 08/22/2013  . Migraine   . Polyneuropathy in diabetes(357.2)   . Polysubstance abuse    alcohol and cocaine  . Skin infection 06/10/2015  . Tobacco abuse counseling 10/09/2014    Past Surgical History:   Past Surgical History:  Procedure Laterality Date  . CATARACT EXTRACTION     bilaterally  . EYE SURGERY  04/2012   detatched retna, bilateral  . EYE SURGERY  08/2012  . retinol detachment Bilateral 2014    Social History:   Social History   Social History  . Marital status: Married    Spouse name: Linton Rump  . Number of children: 0  . Years of education: 62   Occupational History  . Disabled    Social History Main Topics  . Smoking status: Current Every Day Smoker    Packs/day: 0.25    Types: Cigarettes  . Smokeless tobacco: Never Used     Comment: just recently began to smoke again after quitting in 2013, 3 cigarettes a week  . Alcohol  use 1.2 oz/week    2 Cans of beer per week     Comment: states occassionally drinks beer  . Drug use:     Types: Cocaine  . Sexual activity: Not on file   Other Topics Concern  . Not on file   Social History Narrative   Patient is married Linton Rump) and lives at home with his wife.   Patient is disabled.   Patient has a high school education.   Patient is right-handed.   Patient drinks 3-4 cups of coffee most days.    Allergies   Review of patient's allergies indicates no known allergies.  Family history:     Family History  Problem Relation Age of Onset  . Alzheimer's disease Brother   . Cancer Father   . Hypertension Father   . Heart attack Father   . Hypertension Mother   . Gout Mother   . Diabetes Sister   . Hypertension Sister   . Hypertension Sister   . Hypertension Sister     Current Medications:   Prior to Admission medications   Medication Sig Start Date End Date Taking? Authorizing Provider  aspirin EC 81 MG tablet Take 1 tablet by mouth every morning.    Yes Historical Provider, MD  dexamethasone (DECADRON) 4 MG tablet 2 tablets by mouth twice a day the day before, day of and day after the chemotherapy every 3 weeks 06/09/15  Yes Curt Bears, MD  dextrose ([facility] GLUCOSE) 40 % GEL Take by mouth.   Yes Historical Provider, MD  Ferrous Sulfate (IRON) 325 (65 Fe) MG TABS Take 1 tablet by mouth every morning.    Yes Historical Provider, MD  HYDROcodone-acetaminophen (NORCO/VICODIN) 5-325 MG tablet Take 2 tablets by mouth every 6 (six) hours as needed. Patient taking differently: Take 2 tablets by mouth every 6 (six) hours as needed (pain.).  09/12/15  Yes Curt Bears, MD  insulin NPH-regular Human (NOVOLIN 70/30) (70-30) 100 UNIT/ML injection Inject 35 Units into the skin 2 (two) times daily with a meal. Patient taking differently: Inject 15 Units into the skin 2 (two) times daily with a meal.  01/23/14  Yes Deepak Advani, MD  lidocaine-prilocaine (EMLA) cream Apply 1 application topically as needed. 02/03/15  Yes Curt Bears, MD  losartan-hydrochlorothiazide (HYZAAR) 100-25 MG per tablet Take 1 tablet by mouth daily. 10/23/14  Yes Historical Provider, MD  prochlorperazine (COMPAZINE) 10 MG tablet TAKE 1 TABLET (10 MG TOTAL) BY MOUTH EVERY 6 (SIX) HOURS AS NEEDED FOR NAUSEA OR VOMITING. 04/24/15  Yes Curt Bears, MD  simvastatin (ZOCOR) 40 MG tablet Take 1 tablet (40 mg total) by mouth daily. 01/23/14  Yes Lorayne Marek, MD  vitamin C (ASCORBIC ACID) 500 MG tablet Take 500 mg  by mouth daily.   Yes Historical Provider, MD  benzonatate (TESSALON) 100 MG capsule TAKE ONE CAPSULE EVERY 8 HOURS Patient not taking: Reported on 09/30/2015 04/09/15   Curt Bears, MD  dexamethasone (DECADRON) 4 MG tablet 2 TABLETS BY MOUTH TWICE A DAY THE DAY BEFORE, DAY OF AND DAY AFTER THE CHEMOTHERAPY EVERY 3 WEEKS Patient not taking: Reported on 09/30/2015 08/12/15   Curt Bears, MD  metFORMIN (GLUCOPHAGE) 1000 MG tablet Take 1 tablet (1,000 mg total) by mouth 2 (two) times daily. Patient not taking: Reported on 09/30/2015 01/23/14   Lorayne Marek, MD  ondansetron (ZOFRAN) 8 MG tablet TAKE 1 TABLET EVERY 8 HOURS AS NEEDED FOR NAUSEA AND VOMITING Patient not taking: Reported on 09/30/2015 06/09/15   [facility]  Mohamed, MD  temazepam (RESTORIL) 30 MG capsule TAKE ONE CAPSULE AT BEDTIME Patient not taking: Reported on 09/30/2015 06/19/15   Curt Bears, MD    Physical Exam:   Vitals:   09/30/15 1645 09/30/15 1700 09/30/15 1713 09/30/15 1722  BP:  120/76  137/68  Pulse: 113 113 111 109  Resp: '23 21 23 19  '$ Temp:      TempSrc:      SpO2: 95% 95% 97% 97%     Physical Exam: Blood pressure 137/68, pulse 109, temperature 98.3 F (36.8 C), temperature source Oral, resp. rate 19, SpO2 97 %. Gen: No acute distress. Head: Normocephalic, atraumatic. Eyes: Pupils equal, round and reactive to light. Extraocular movements intact.  Sclerae nonicteric. Mouth: Oropharynx reveals moist mucous membranes. Neck: Supple, no thyromegaly, no lymphadenopathy, no jugular venous distention. Chest: Lungs are clear to auscultation with good air movement. No rales, rhonchi or wheezes.  CV: Heart sounds are regular, tachycardic. No murmurs, rubs, clicks, or gallops.  Abdomen: Soft, nontender, nondistended with normal active bowel sounds. Extremities: Extremities are without clubbing, edema, or cyanosis. Pedal pulses 2+.  Skin: Warm and dry. No rashes, lesions or wounds. Neuro: Alert and oriented times 3;  grossly nonfocal.  Psych: Insight is good and judgment is appropriate. Mood and affect normal.   Data Review:    Labs: Basic Metabolic Panel:  Recent Labs Lab 09/30/15 0955 09/30/15 1512  NA 134*  --   K 8.0 No visable hemolysis, recollected to verify*  --   CO2 19*  --   GLUCOSE 328*  --   BUN 24.7  --   CREATININE 1.7*  --   CALCIUM 9.1  --   MG  --  1.7  PHOS  --  2.4*   Liver Function Tests:  Recent Labs Lab 09/30/15 0955  AST 15  ALT 16  ALKPHOS 85  BILITOT <0.30  PROT 6.7  ALBUMIN 3.3*   CBC:  Recent Labs Lab 09/30/15 0955  WBC 13.9*  NEUTROABS 12.6*  HGB 10.1*  HCT 32.5*  MCV 73.1*  PLT 352   Cardiac Enzymes:  Recent Labs Lab 09/30/15 1512  CKTOTAL 116    BNP (last 3 results) No results for input(s): PROBNP in the last 8760 hours. CBG: No results for input(s): GLUCAP in the last 168 hours.  Urinalysis    Component Value Date/Time   LABSPEC <=1.005 05/12/2010 1656   PHURINE 6.0 05/12/2010 1656   GLUCOSEU 500 (A) 05/12/2010 1656   HGBUR TRACE (A) 05/12/2010 1656   BILIRUBINUR NEGATIVE 05/12/2010 1656   KETONESUR NEGATIVE 05/12/2010 1656   PROTEINUR Negative 09/23/2015 1137   UROBILINOGEN 0.2 05/12/2010 1656   NITRITE NEGATIVE 05/12/2010 1656   LEUKOCYTESUR  05/12/2010 1656    NEGATIVE Biochemical Testing Only. Please order routine urinalysis from main lab if confirmatory testing is needed.      Radiographic Studies: No results found.  EKG: Independently reviewed. Peaked T waves   Assessment/Plan:   Principal Problem:   Hyperkalemia with peaked T waves Patient has chronic renal insufficiency/stage III chronic kidney disease which may be responsible for his hyperkalemia. Tumor lysis syndrome could also be contributing, but phosphorus is low and magnesium is normal. Will also check uric acid. CK level is WNL so rhabdomyolysis is ruled out. We'll hydrate, repeat potassium levels later this evening, and continue Kayexalate if  needed. Low potassium diet. Monitor on telemetry.  Active Problems:   Systemic inflammatory response syndrome Tachycardia and tachypnea in this patient with a  history of cocaine abuse and alcohol abuse likely related to that and not underlying infection. The patient has already received a 2 L bolus of normal saline to address his hyperkalemia. We'll check lactic acid and Pro calcitonin, but there is absolutely no indication to initiate antibiotics at this time. The patient is afebrile and nontoxic appearing.    Essential hypertension, benign/acute kidney injury Hold losartan/HCTZ and gently hydrate. Baseline creatinine 1.2.    Non-small cell carcinoma of lung, stage 4 (HCC) Chemotherapy held secondary to hyperkalemia. Was scheduled for a cycle of chemotherapy today.    Cocaine abuse, history of alcohol abuse Monitor for systemic signs of withdrawal. Tachycardia and tachypnea may be related to acute cocaine exposure, check urine drug screen. Ativan as needed.    Type 2 diabetes with ophthalmic complications Continue 42/87 twice a day. Add insulin sensitive SSI Q AC/HS.    Hyperlipidemia Continue Zocor.  Other information:   DVT prophylaxis: Subcutaneous heparin ordered. Code Status: Full code. Family Communication: Wife updated the bedside.  Disposition Plan: Home 10/01/15 if electrolytes stable. Consults called: None. Admission status: Observation.   Time spent: One hour.  RAMA,CHRISTINA [facility] Pager [phone] Cell: [phone]   If 7PM-7AM, please contact night-coverage [email or web address] Password [facility] 09/30/2015, 6:14 PM

## 2015-09-30 NOTE — ED Triage Notes (Addendum)
Pt presents with c/o abnormal potassium. Pt reports he is a cancer patient and is currently receiving chemo, last treatment a couple of weeks ago. Pt came today to the [facility] for chemo and was told to come to the ER because his potassium was too high. Pt reports he feels fine at this time.

## 2015-09-30 NOTE — ED Notes (Signed)
Bed: [facility]PT2 Expected date:  Expected time:  Means of arrival:  Comments: Triaging in room

## 2015-09-30 NOTE — ED Provider Notes (Signed)
[facility] DEPT Provider Note   CSN: [id number] Arrival date & time: 09/30/15  1204     History   Chief Complaint Chief Complaint  Patient presents with  . Abnormal Lab    HPI Keith Holloway is a 55 y.o. male.  HPI   Supposed to get regular chemo today, however potassium too high. Was high 1 week ago and told to take kayexalate and did have BM. K however went from 6.1 1 week ago to 8 today. No lightheadedness.   Pt not having any symptoms. Had some bananas, greens. Not taking potassium pills. Last chemo was 3 weeks ago.     Past Medical History:  Diagnosis Date  . Alcohol abuse   . Arthritis   . Cancer ([place]) dx'd 04/2014   Lung Cancer  . CHF (congestive heart failure) ([place])   . Depression   . Diabetes mellitus without complication ([place])   . High cholesterol   . Hypertension   . Insomnia 05/20/2015  . Legally blind   . Memory disorder 08/22/2013  . Migraine   . Polyneuropathy in diabetes(357.2)   . Polysubstance abuse    alcohol and cocaine  . Skin infection 06/10/2015  . Tobacco abuse counseling 10/09/2014    Patient Active Problem List   Diagnosis Date Noted  . Acute kidney injury ([place]) 09/30/2015  . SIRS (systemic inflammatory response syndrome) ([place]) 09/30/2015  . Hyperkalemia 09/23/2015  . Skin infection 06/10/2015  . Insomnia 05/20/2015  . Dehydration 04/29/2015  . Encounter for antineoplastic immunotherapy 02/18/2015  . Chronic renal insufficiency 01/23/2015  . Tobacco abuse counseling 10/09/2014  . Encounter for antineoplastic chemotherapy 08/28/2014  . Non-small cell carcinoma of lung, stage 4 ([place]) 06/13/2014  . Other specified diabetes mellitus without complications ([place]) 44/02/270  . Essential hypertension, benign 01/23/2014  . Memory disorder 08/22/2013  . Alcohol abuse 07/31/2013  . Diabetes mellitus with ophthalmic complication ([place]) 53/66/4403  . HYPERLIPIDEMIA 04/14/2006    Past Surgical History:  Procedure Laterality Date  .  CATARACT EXTRACTION     bilaterally  . EYE SURGERY  04/2012   detatched retna, bilateral  . EYE SURGERY  08/2012  . retinol detachment Bilateral 2014       Home Medications    Prior to Admission medications   Medication Sig Start Date End Date Taking? Authorizing Provider  aspirin EC 81 MG tablet Take 1 tablet by mouth every morning.    Yes Historical Provider, MD  dexamethasone (DECADRON) 4 MG tablet 2 tablets by mouth twice a day the day before, day of and day after the chemotherapy every 3 weeks 06/09/15  Yes Curt Bears, MD  dextrose ([facility] GLUCOSE) 40 % GEL Take by mouth.   Yes Historical Provider, MD  Ferrous Sulfate (IRON) 325 (65 Fe) MG TABS Take 1 tablet by mouth every morning.    Yes Historical Provider, MD  HYDROcodone-acetaminophen (NORCO/VICODIN) 5-325 MG tablet Take 2 tablets by mouth every 6 (six) hours as needed. Patient taking differently: Take 2 tablets by mouth every 6 (six) hours as needed (pain.).  09/12/15  Yes Curt Bears, MD  insulin NPH-regular Human (NOVOLIN 70/30) (70-30) 100 UNIT/ML injection Inject 35 Units into the skin 2 (two) times daily with a meal. Patient taking differently: Inject 15 Units into the skin 2 (two) times daily with a meal. ONLY IF CBG IS OVER 200. 01/23/14  Yes Deepak Advani, MD  lidocaine-prilocaine (EMLA) cream Apply 1 application topically as needed. 02/03/15  Yes Curt Bears, MD  losartan-hydrochlorothiazide [facility])  100-25 MG per tablet Take 1 tablet by mouth daily. 10/23/14  Yes Historical Provider, MD  prochlorperazine (COMPAZINE) 10 MG tablet TAKE 1 TABLET (10 MG TOTAL) BY MOUTH EVERY 6 (SIX) HOURS AS NEEDED FOR NAUSEA OR VOMITING. 04/24/15  Yes Curt Bears, MD  simvastatin (ZOCOR) 40 MG tablet Take 1 tablet (40 mg total) by mouth daily. 01/23/14  Yes Lorayne Marek, MD  vitamin C (ASCORBIC ACID) 500 MG tablet Take 500 mg by mouth daily.   Yes Historical Provider, MD  benzonatate (TESSALON) 100 MG capsule TAKE ONE CAPSULE EVERY  8 HOURS Patient not taking: Reported on 09/30/2015 04/09/15   Curt Bears, MD  dexamethasone (DECADRON) 4 MG tablet 2 TABLETS BY MOUTH TWICE A DAY THE DAY BEFORE, DAY OF AND DAY AFTER THE CHEMOTHERAPY EVERY 3 WEEKS Patient not taking: Reported on 09/30/2015 08/12/15   Curt Bears, MD  metFORMIN (GLUCOPHAGE) 1000 MG tablet Take 1 tablet (1,000 mg total) by mouth 2 (two) times daily. Patient not taking: Reported on 09/30/2015 01/23/14   Lorayne Marek, MD  ondansetron (ZOFRAN) 8 MG tablet TAKE 1 TABLET EVERY 8 HOURS AS NEEDED FOR NAUSEA AND VOMITING Patient not taking: Reported on 09/30/2015 06/09/15   Curt Bears, MD  temazepam (RESTORIL) 30 MG capsule TAKE ONE CAPSULE AT BEDTIME Patient not taking: Reported on 09/30/2015 06/19/15   Curt Bears, MD    Family History Family History  Problem Relation Age of Onset  . Alzheimer's disease Brother   . Cancer Father   . Hypertension Father   . Heart attack Father   . Hypertension Mother   . Gout Mother   . Diabetes Sister   . Hypertension Sister   . Hypertension Sister   . Hypertension Sister     Social History Social History  Substance Use Topics  . Smoking status: Current Every Day Smoker    Packs/day: 0.25    Types: Cigarettes  . Smokeless tobacco: Never Used     Comment: just recently began to smoke again after quitting in 2013, 3 cigarettes a week  . Alcohol use 1.2 oz/week    2 Cans of beer per week     Comment: states occassionally drinks beer     Allergies   Review of patient's allergies indicates no known allergies.   Review of Systems Review of Systems  Constitutional: Negative for fever.  HENT: Negative for sore throat.   Eyes: Negative for visual disturbance.  Respiratory: Positive for cough (chronic unchanged). Negative for shortness of breath.   Cardiovascular: Negative for chest pain.  Gastrointestinal: Negative for abdominal pain, constipation, diarrhea, nausea and vomiting.  Genitourinary: Negative  for difficulty urinating.  Musculoskeletal: Negative for back pain and neck stiffness.  Skin: Positive for wound (prior i/d gluteal cleft weeks ago). Negative for rash.  Neurological: Negative for syncope and headaches.     Physical Exam Updated Vital Signs BP (!) 141/77 (BP Location: Left Arm)   Pulse (!) 105   Temp 97.5 F (36.4 C) (Oral)   Resp 19   Ht '5\' 11"'$  (1.803 m)   SpO2 100%   Physical Exam  Constitutional: He is oriented to person, place, and time. He appears well-developed and well-nourished. No distress.  HENT:  Head: Normocephalic and atraumatic.  Eyes: Conjunctivae and EOM are normal.  Neck: Normal range of motion.  Cardiovascular: Normal rate, regular rhythm, normal heart sounds and intact distal pulses.  Exam reveals no gallop and no friction rub.   No murmur heard. Pulmonary/Chest: Effort normal and  breath sounds normal. No respiratory distress. He has no wheezes. He has no rales.  Abdominal: Soft. He exhibits no distension. There is no tenderness. There is no guarding.  Genitourinary:  Genitourinary Comments: Gluteal cleft healing I/d site, no fluctuance or erythema  Musculoskeletal: He exhibits no edema.  Neurological: He is alert and oriented to person, place, and time.  Skin: Skin is warm and dry. He is not diaphoretic.  Nursing note and vitals reviewed.    ED Treatments / Results  Labs (all labs ordered are listed, but only abnormal results are displayed) Labs Reviewed  PHOSPHORUS - Abnormal; Notable for the following:       Result Value   Phosphorus 2.4 (*)    All other components within normal limits  CBC - Abnormal; Notable for the following:    WBC 14.1 (*)    RBC 3.50 (*)    Hemoglobin 8.3 (*)    HCT 24.5 (*)    MCV 70.0 (*)    MCH 23.7 (*)    RDW 18.5 (*)    All other components within normal limits  CREATININE, SERUM - Abnormal; Notable for the following:    Creatinine, Ser 1.78 (*)    GFR calc non Af Amer 41 (*)    GFR calc Af Amer  48 (*)    All other components within normal limits  LACTIC ACID, PLASMA - Abnormal; Notable for the following:    Lactic Acid, Venous 5.6 (*)    All other components within normal limits  POTASSIUM - Abnormal; Notable for the following:    Potassium 6.9 (*)    All other components within normal limits  GLUCOSE, CAPILLARY - Abnormal; Notable for the following:    Glucose-Capillary >600 (*)    All other components within normal limits  GLUCOSE, CAPILLARY - Abnormal; Notable for the following:    Glucose-Capillary 479 (*)    All other components within normal limits  MRSA PCR SCREENING  MAGNESIUM  CK  PROCALCITONIN  PROTIME-INR  APTT  URIC ACID  BASIC METABOLIC PANEL  LACTIC ACID, PLASMA  URINE RAPID DRUG SCREEN, HOSP PERFORMED  HEMOGLOBIN A1C    EKG  EKG Interpretation  Date/Time:  Tuesday September 30 2015 14:03:53 EDT Ventricular Rate:  73 PR Interval:    QRS Duration: 96 QT Interval:  345 QTC Calculation: 381 R Axis:   -20 Text Interpretation:  Sinus rhythm Prolonged PR interval Borderline left axis deviation Peaked T waves in comparison to prior ECG Confirmed by [facility] MD, Gibson Lad ([id number]) on 09/30/2015 2:56:45 PM       Radiology No results found.  Procedures Procedures (including critical care time)  Medications Ordered in ED Medications  HYDROcodone-acetaminophen (NORCO/VICODIN) 5-325 MG per tablet 2 tablet (not administered)  ferrous sulfate tablet 325 mg (not administered)  vitamin C (ASCORBIC ACID) tablet 500 mg (not administered)  aspirin EC tablet 81 mg (not administered)  insulin aspart protamine- aspart (NOVOLOG MIX 70/30) injection 15 Units (not administered)  simvastatin (ZOCOR) tablet 40 mg (40 mg Oral Given 09/30/15 1839)  heparin injection 5,000 Units (not administered)  0.9 %  sodium chloride infusion ( Intravenous New Bag/Given 09/30/15 1839)  acetaminophen (TYLENOL) tablet 650 mg (not administered)    Or  acetaminophen (TYLENOL) suppository  650 mg (not administered)  ondansetron (ZOFRAN) tablet 4 mg (not administered)    Or  ondansetron (ZOFRAN) injection 4 mg (not administered)  LORazepam (ATIVAN) injection 1 mg (not administered)  insulin aspart (novoLOG) injection 0-9 Units (not administered)  insulin aspart (novoLOG) injection 0-5 Units (0 Units Subcutaneous Hold 09/30/15 2121)  sodium chloride flush (NS) 0.9 % injection 10-40 mL (10 mLs Intracatheter Given 09/30/15 2015)  calcium gluconate 1 g in sodium chloride 0.9 % 100 mL IVPB (not administered)  dextrose 50 % solution 50 mL (not administered)  insulin aspart (novoLOG) injection 10 Units (not administered)  sodium chloride 0.9 % bolus 1,000 mL (not administered)  sodium polystyrene (KAYEXALATE) 15 GM/60ML suspension 30 g (not administered)  insulin aspart (novoLOG) injection 10 Units (10 Units Intravenous Given 09/30/15 1412)  dextrose 50 % solution 50 mL (50 mLs Intravenous Given 09/30/15 1411)  sodium chloride 0.9 % bolus 1,000 mL (0 mLs Intravenous Stopped 09/30/15 1553)  albuterol (PROVENTIL,VENTOLIN) solution continuous neb (20 mg/hr Nebulization Given 09/30/15 1414)  calcium gluconate 1 g in sodium chloride 0.9 % 100 mL IVPB (0 g Intravenous Stopped 09/30/15 1553)  sodium polystyrene (KAYEXALATE) 15 GM/60ML suspension 30 g (30 g Oral Given 09/30/15 1433)   CRITICAL CARE: hyperkalemia Performed by: Alvino Chapel   Total critical care time: 30 minutes  Critical care time was exclusive of separately billable procedures and treating other patients.  Critical care was necessary to treat or prevent imminent or life-threatening deterioration.  Critical care was time spent personally by me on the following activities: development of treatment plan with patient and/or surrogate as well as nursing, discussions with consultants, evaluation of patient's response to treatment, examination of patient, obtaining history from patient or surrogate, ordering and  performing treatments and interventions, ordering and review of laboratory studies, ordering and review of radiographic studies, pulse oximetry and re-evaluation of patient's condition.   Initial Impression / Assessment and Plan / ED Course  I have reviewed the triage vital signs and the nursing notes.  Pertinent labs & imaging results that were available during my care of the patient were reviewed by me and considered in my medical decision making (see chart for details).  Clinical Course   55yo male with history of DM, htn, CHF, nonsmall cell lung cancer (last chemo 3wk ago) presents from oncology for concern for hyperkalemia. Pt with K 6.1 on 8/8 with oncology and given kayexalate as outpatient, however on arrival to clinic today K was 8.  Pt with peaked T waves on ECG. Cr 1.7, patient making urine.  Given insulin, dextrose, continuous albuterol, calcium, IV fluids and kayexalate. Unclear etiology of hyperkalemia, possible tumor lysis, will continue to evaluate as inpt.  Consulted hospitalist for admission.   Final Clinical Impressions(s) / ED Diagnoses   Final diagnoses:  Hyperkalemia    New Prescriptions Current Discharge Medication List       Gareth Morgan, MD 09/30/15 2135

## 2015-09-30 NOTE — ED Notes (Addendum)
Delay charge nurse starting blood and having previous rapid response.Jessica 17:00 PT TO FLOOR.

## 2015-09-30 NOTE — ED Notes (Signed)
20 minute timer started. Patient can go to floor at 17:28

## 2015-09-30 NOTE — ED Notes (Signed)
Respiratory notified of continuous nebulization need.

## 2015-09-30 NOTE — Progress Notes (Signed)
Delay in starting treatment d/t need for recollection of CMP.  Pt's potassium level 8.0.  Pt being taken to the ED via wheelchair per Dr Julien Nordmann by Stanton Kidney, RN

## 2015-09-30 NOTE — Progress Notes (Signed)
Pt. CBG 479. NP made aware, STAT lab verification order requested. Will continue to monitor pt. Closely and will carry out any new orders.

## 2015-10-01 DIAGNOSIS — Z9221 Personal history of antineoplastic chemotherapy: Secondary | ICD-10-CM | POA: Diagnosis not present

## 2015-10-01 DIAGNOSIS — E1122 Type 2 diabetes mellitus with diabetic chronic kidney disease: Secondary | ICD-10-CM | POA: Diagnosis present

## 2015-10-01 DIAGNOSIS — Z8249 Family history of ischemic heart disease and other diseases of the circulatory system: Secondary | ICD-10-CM | POA: Diagnosis not present

## 2015-10-01 DIAGNOSIS — F17210 Nicotine dependence, cigarettes, uncomplicated: Secondary | ICD-10-CM | POA: Diagnosis present

## 2015-10-01 DIAGNOSIS — I129 Hypertensive chronic kidney disease with stage 1 through stage 4 chronic kidney disease, or unspecified chronic kidney disease: Secondary | ICD-10-CM | POA: Diagnosis present

## 2015-10-01 DIAGNOSIS — E875 Hyperkalemia: Principal | ICD-10-CM

## 2015-10-01 DIAGNOSIS — E1142 Type 2 diabetes mellitus with diabetic polyneuropathy: Secondary | ICD-10-CM | POA: Diagnosis present

## 2015-10-01 DIAGNOSIS — Z7982 Long term (current) use of aspirin: Secondary | ICD-10-CM | POA: Diagnosis not present

## 2015-10-01 DIAGNOSIS — E11319 Type 2 diabetes mellitus with unspecified diabetic retinopathy without macular edema: Secondary | ICD-10-CM | POA: Diagnosis present

## 2015-10-01 DIAGNOSIS — R6510 Systemic inflammatory response syndrome (SIRS) of non-infectious origin without acute organ dysfunction: Secondary | ICD-10-CM | POA: Diagnosis present

## 2015-10-01 DIAGNOSIS — Z809 Family history of malignant neoplasm, unspecified: Secondary | ICD-10-CM | POA: Diagnosis not present

## 2015-10-01 DIAGNOSIS — N183 Chronic kidney disease, stage 3 (moderate): Secondary | ICD-10-CM | POA: Diagnosis present

## 2015-10-01 DIAGNOSIS — E7800 Pure hypercholesterolemia, unspecified: Secondary | ICD-10-CM | POA: Diagnosis present

## 2015-10-01 DIAGNOSIS — Z79891 Long term (current) use of opiate analgesic: Secondary | ICD-10-CM | POA: Diagnosis not present

## 2015-10-01 DIAGNOSIS — E785 Hyperlipidemia, unspecified: Secondary | ICD-10-CM | POA: Diagnosis present

## 2015-10-01 DIAGNOSIS — C3490 Malignant neoplasm of unspecified part of unspecified bronchus or lung: Secondary | ICD-10-CM | POA: Diagnosis present

## 2015-10-01 DIAGNOSIS — H548 Legal blindness, as defined in USA: Secondary | ICD-10-CM | POA: Diagnosis present

## 2015-10-01 DIAGNOSIS — F1410 Cocaine abuse, uncomplicated: Secondary | ICD-10-CM | POA: Diagnosis present

## 2015-10-01 DIAGNOSIS — Z820 Family history of epilepsy and other diseases of the nervous system: Secondary | ICD-10-CM | POA: Diagnosis not present

## 2015-10-01 DIAGNOSIS — N179 Acute kidney failure, unspecified: Secondary | ICD-10-CM | POA: Diagnosis present

## 2015-10-01 DIAGNOSIS — Z833 Family history of diabetes mellitus: Secondary | ICD-10-CM | POA: Diagnosis not present

## 2015-10-01 DIAGNOSIS — Z794 Long term (current) use of insulin: Secondary | ICD-10-CM | POA: Diagnosis not present

## 2015-10-01 LAB — RAPID URINE DRUG SCREEN, HOSP PERFORMED
AMPHETAMINES: NOT DETECTED
BARBITURATES: NOT DETECTED
BENZODIAZEPINES: NOT DETECTED
COCAINE: NOT DETECTED
Opiates: NOT DETECTED
Tetrahydrocannabinol: NOT DETECTED

## 2015-10-01 LAB — BASIC METABOLIC PANEL
ANION GAP: 2 — AB (ref 5–15)
Anion gap: 5 (ref 5–15)
BUN: 21 mg/dL — AB (ref 6–20)
BUN: 19 mg/dL (ref 6–20)
CHLORIDE: 114 mmol/L — AB (ref 101–111)
CHLORIDE: 115 mmol/L — AB (ref 101–111)
CO2: 19 mmol/L — ABNORMAL LOW (ref 22–32)
CO2: 21 mmol/L — ABNORMAL LOW (ref 22–32)
CREATININE: 1.48 mg/dL — AB (ref 0.61–1.24)
Calcium: 8.4 mg/dL — ABNORMAL LOW (ref 8.9–10.3)
Calcium: 8.4 mg/dL — ABNORMAL LOW (ref 8.9–10.3)
Creatinine, Ser: 1.34 mg/dL — ABNORMAL HIGH (ref 0.61–1.24)
GFR calc Af Amer: 60 mL/min — ABNORMAL LOW (ref >60)
GFR calc Af Amer: >60 mL/min (ref >60)
GFR calc non Af Amer: 52 mL/min — ABNORMAL LOW (ref >60)
GFR calc non Af Amer: 58 mL/min — ABNORMAL LOW (ref >60)
GLUCOSE: 114 mg/dL — AB (ref 65–99)
Glucose, Bld: 263 mg/dL — ABNORMAL HIGH (ref 65–99)
POTASSIUM: 4.7 mmol/L (ref 3.5–5.1)
Potassium: 5.4 mmol/L — ABNORMAL HIGH (ref 3.5–5.1)
SODIUM: 138 mmol/L (ref 135–145)
SODIUM: 138 mmol/L (ref 135–145)

## 2015-10-01 LAB — LACTIC ACID, PLASMA
LACTIC ACID, VENOUS: 1.2 mmol/L (ref 0.5–1.9)
LACTIC ACID, VENOUS: 3.3 mmol/L — AB (ref 0.5–1.9)
LACTIC ACID, VENOUS: 3.7 mmol/L — AB (ref 0.5–1.9)

## 2015-10-01 LAB — GLUCOSE, CAPILLARY
GLUCOSE-CAPILLARY: 134 mg/dL — AB (ref 65–99)
GLUCOSE-CAPILLARY: 79 mg/dL (ref 65–99)
Glucose-Capillary: 220 mg/dL — ABNORMAL HIGH (ref 65–99)
Glucose-Capillary: 154 mg/dL — ABNORMAL HIGH (ref 65–99)
Glucose-Capillary: 64 mg/dL — ABNORMAL LOW (ref 65–99)
Glucose-Capillary: 105 mg/dL — ABNORMAL HIGH (ref 65–99)

## 2015-10-01 LAB — POTASSIUM: Potassium: 5.7 mmol/L — ABNORMAL HIGH (ref 3.5–5.1)

## 2015-10-01 MED ORDER — INSULIN ASPART 100 UNIT/ML ~~LOC~~ SOLN
0.0000 [IU] | Freq: Three times a day (TID) | SUBCUTANEOUS | Status: DC
  Administered 2015-10-01: 2 [IU] via SUBCUTANEOUS
  Administered 2015-10-01: 3 [IU] via SUBCUTANEOUS

## 2015-10-01 MED ORDER — SODIUM CHLORIDE 0.9 % IV BOLUS (SEPSIS)
500.0000 mL | Freq: Once | INTRAVENOUS | Status: AC
  Administered 2015-10-01: 500 mL via INTRAVENOUS

## 2015-10-01 MED ORDER — INSULIN ASPART 100 UNIT/ML ~~LOC~~ SOLN: 0.0000 [IU] | Freq: Every day | SUBCUTANEOUS | Status: DC

## 2015-10-01 NOTE — Progress Notes (Signed)
CRITICAL VALUE ALERT  Critical value received:  Lactic acid 3.3  Date of notification:  10/01/15   Time of notification:  0226  Critical value read back:Yes.    Nurse who received alert: Yves Dill   MD notified (1st page):  Baltazar Najjar   Time of first page:  0227  MD notified (2nd page):  Time of second page:  Responding MD:  Baltazar Najjar   Time MD responded:  [phone]

## 2015-10-01 NOTE — Progress Notes (Signed)
CRITICAL VALUE ALERT  Critical value received:  Lactic acid 3.7  Date of notification:  10/01/15   Time of notification: 0017  Critical value read back:Yes.    Nurse who received alert:  Mena Pauls   MD notified (1st page):  Baltazar Najjar   Time of first page:  0018  MD notified (2nd page):  Time of second page:  Responding MD: Baltazar Najjar   Time MD responded:  [phone]

## 2015-10-01 NOTE — Progress Notes (Signed)
[facility] Progress Note  Patient: Keith Holloway WUX:[id number]   PCP: [name], MD DOB: 1960-06-30   DOA: 09/30/2015   DOS: 10/01/2015   Date of Service: the patient was seen and examined on 10/01/2015  Subjective: Denies any acute complaint. Continues to have some fatigue. No nausea no vomiting, chest pain. Nutrition: Tolerating oral diet  Brief hospital course: Keith Holloway is an 55 y.o. male with a PMH of non-small cell lung cancer, diagnosed April 2016, status post chemotherapy and immunotherapy with disease progression, under the care of Dr. Julien Nordmann, who presented to the clinic for a follow-up visit today for routine labs to be done prior to receiving chemotherapy with docetaxel and Cyramza, but the scheduled dose of chemotherapy was held secondary to the finding of an elevated potassium level of 8.0.  He was transported to the ED for further evaluation. The patient endorses some fatigue/weakness, occasional shortness of breath, but no other significant symptoms. The patient admits to cocaine use. Last used last week. Currently further plan is continue monitoring on telemetry.  Assessment and Plan: Principal Problem:   Hyperkalemia with peaked T waves Patient has chronic renal insufficiency/stage III chronic kidney disease which may be responsible for his hyperkalemia. Tumor lysis syndrome could also be contributing, but phosphorus is low and magnesium is normal. Low potassium diet. Monitor on telemetry.  Active Problems:   Systemic inflammatory response syndrome Tachycardia and tachypnea in this patient with a history of cocaine abuse and alcohol abuse likely related to that and not underlying infection.  The patient is afebrile and nontoxic appearing.    Essential hypertension, benign/acute kidney injury Hold losartan/HCTZ and gently hydrate. Baseline creatinine 1.2.    Non-small cell carcinoma of lung, stage 4 (HCC) Chemotherapy held secondary to hyperkalemia. Was  scheduled for a cycle of chemotherapy.    Cocaine abuse, history of alcohol abuse Monitor for systemic signs of withdrawal. Tachycardia and tachypnea may be related to acute cocaine exposure, urine drug screen. Ativan as needed.    Type 2 diabetes with ophthalmic complications Continue 25/36 twice a day. Add insulin sensitive SSI Q AC/HS.    Hyperlipidemia Continue Zocor.  Pain management: When necessary Tylenol Activity: Consulted physical therapy Bowel regimen: last BM 10/01/2015 Diet: Cardiac diet DVT Prophylaxis: subcutaneous Heparin  Advance goals of care discussion: Full code  Family Communication: no family was present at bedside, at the time of interview.  Disposition:  Discharge to home. Expected discharge date: 10/02/2015 pending PT consult,   Consultants: None Procedures: None  Antibiotics: Anti-infectives    None        Intake/Output Summary (Last 24 hours) at 10/01/15 1851 Last data filed at 10/01/15 1848  Gross per 24 hour  Intake          3242.92 ml  Output             1200 ml  Net          2042.92 ml   Filed Weights   10/01/15 0819  Weight: 79.4 kg (175 lb)    Objective: Physical Exam: Vitals:   09/30/15 2110 10/01/15 0540 10/01/15 0819 10/01/15 1424  BP: (!) 141/77 113/76  127/84  Pulse: (!) 105 81  90  Resp: 19 18    Temp: 97.5 F (36.4 C) 98.2 F (36.8 C)  98.2 F (36.8 C)  TempSrc: Oral Oral  Oral  SpO2: 100% 100%  100%  Weight:   79.4 kg (175 lb)   Height:   '5\' 11"'$  (1.803 m)  General: Alert, Awake and Oriented to Time, Place and Person. Appear in mild distress Eyes: PERRL, Conjunctiva normal ENT: Oral Mucosa clear moist. Neck: no JVD, no Abnormal Mass Or lumps Cardiovascular: S1 and S2 Present, no Murmur, Respiratory: Bilateral Air entry equal and Decreased, Clear to Auscultation, no Crackles, no wheezes Abdomen: Bowel Sound present, Soft and no tenderness Skin: no redness, no Rash  Extremities: no Pedal edema, no calf  tenderness Neurologic: Grossly no focal neuro deficit. Bilaterally Equal motor strength  Data Reviewed: CBC:  Recent Labs Lab 09/30/15 0955 09/30/15 1922  WBC 13.9* 14.1*  NEUTROABS 12.6*  --   HGB 10.1* 8.3*  HCT 32.5* 24.5*  MCV 73.1* 70.0*  PLT 352 431   Basic Metabolic Panel:  Recent Labs Lab 09/30/15 0955 09/30/15 1512 09/30/15 1922 10/01/15 0045 10/01/15 0345 10/01/15 1545  NA 134*  --   --   --  138 138  K 8.0 No visable hemolysis, recollected to verify*  --  6.9* 5.7* 5.4* 4.7  CL  --   --   --   --  114* 115*  CO2 19*  --   --   --  19* 21*  GLUCOSE 328*  --   --   --  263* 114*  BUN 24.7  --   --   --  21* 19  CREATININE 1.7*  --  1.78*  --  1.48* 1.34*  CALCIUM 9.1  --   --   --  8.4* 8.4*  MG  --  1.7  --   --   --   --   PHOS  --  2.4*  --   --   --   --     Liver Function Tests:  Recent Labs Lab 09/30/15 0955  AST 15  ALT 16  ALKPHOS 85  BILITOT <0.30  PROT 6.7  ALBUMIN 3.3*   No results for input(s): LIPASE, AMYLASE in the last 168 hours. No results for input(s): AMMONIA in the last 168 hours. Coagulation Profile:  Recent Labs Lab 09/30/15 1922  INR 1.16   Cardiac Enzymes:  Recent Labs Lab 09/30/15 1512  CKTOTAL 116   BNP (last 3 results) No results for input(s): PROBNP in the last 8760 hours.  CBG:  Recent Labs Lab 09/30/15 2116 09/30/15 2355 10/01/15 0744 10/01/15 1153 10/01/15 1707  GLUCAP 479* 394* 220* 154* 134*    Studies: No results found.   Scheduled Meds: . aspirin EC  81 mg Oral q morning - 10a  . ferrous sulfate  325 mg Oral q morning - 10a  . heparin  5,000 Units Subcutaneous Q8H  . insulin aspart  0-15 Units Subcutaneous TID WC  . insulin aspart  0-5 Units Subcutaneous QHS  . insulin aspart protamine- aspart  15 Units Subcutaneous BID WC  . simvastatin  40 mg Oral Daily   Continuous Infusions: . sodium chloride 75 mL/hr at 10/01/15 1808   PRN Meds: acetaminophen **OR** acetaminophen,  HYDROcodone-acetaminophen, LORazepam, ondansetron **OR** ondansetron (ZOFRAN) IV, sodium chloride flush  Time spent: 30 minutes  Author: Berle Mull, MD [facility] Pager: [phone] 10/01/2015 6:51 PM  If 7PM-7AM, please contact night-coverage at [email or web address], password [facility]

## 2015-10-02 LAB — CBC
HCT: 24.1 % — ABNORMAL LOW (ref 39.0–52.0)
HEMOGLOBIN: 8 g/dL — AB (ref 13.0–17.0)
MCH: 23.3 pg — AB (ref 26.0–34.0)
MCHC: 33.2 g/dL (ref 30.0–36.0)
MCV: 70.1 fL — ABNORMAL LOW (ref 78.0–100.0)
PLATELETS: 219 10*3/uL (ref 150–400)
RBC: 3.44 MIL/uL — ABNORMAL LOW (ref 4.22–5.81)
RDW: 18.2 % — ABNORMAL HIGH (ref 11.5–15.5)
WBC: 9.6 10*3/uL (ref 4.0–10.5)

## 2015-10-02 LAB — BASIC METABOLIC PANEL
Anion gap: 3 — ABNORMAL LOW (ref 5–15)
BUN: 22 mg/dL — AB (ref 6–20)
CALCIUM: 8.3 mg/dL — AB (ref 8.9–10.3)
CO2: 21 mmol/L — AB (ref 22–32)
CREATININE: 1.41 mg/dL — AB (ref 0.61–1.24)
Chloride: 116 mmol/L — ABNORMAL HIGH (ref 101–111)
GFR calc Af Amer: >60 mL/min (ref >60)
GFR calc non Af Amer: 55 mL/min — ABNORMAL LOW (ref >60)
GLUCOSE: 94 mg/dL (ref 65–99)
Potassium: 5.1 mmol/L (ref 3.5–5.1)
Sodium: 140 mmol/L (ref 135–145)

## 2015-10-02 LAB — GLUCOSE, CAPILLARY
GLUCOSE-CAPILLARY: 75 mg/dL (ref 65–99)
Glucose-Capillary: 98 mg/dL (ref 65–99)

## 2015-10-02 LAB — HEMOGLOBIN A1C
Hgb A1c MFr Bld: 7.1 % — ABNORMAL HIGH (ref 4.8–5.6)
Mean Plasma Glucose: 157 mg/dL

## 2015-10-02 MED ORDER — HEPARIN SOD (PORK) LOCK FLUSH 100 UNIT/ML IV SOLN
500.0000 [IU] | INTRAVENOUS | Status: AC | PRN
  Administered 2015-10-02: 500 [IU]

## 2015-10-02 MED ORDER — ACETAMINOPHEN 325 MG PO TABS: 650.0000 mg | ORAL_TABLET | Freq: Four times a day (QID) | ORAL | 0 refills | Status: DC | PRN

## 2015-10-02 NOTE — Care Management Note (Signed)
Case Management Note  Patient Details  Name: Keith Holloway MRN: [id number] Date of Birth: [birth year]  Subjective/Objective:                   Action/Plan:d/c home no needs or orders.   Expected Discharge Date:   (unknown)               Expected Discharge Plan:  Home/Self Care  In-House Referral:     Discharge planning Services  CM Consult  Post Acute Care Choice:    Choice offered to:     DME Arranged:    DME Agency:     HH Arranged:    [place] Agency:     Status of Service:  Completed, signed off  If discussed at [employer] of Stay Meetings, dates discussed:    Additional Comments:  Dessa Phi, RN 10/02/2015, 12:29 PM

## 2015-10-02 NOTE — Progress Notes (Signed)
Inpatient Diabetes Program Recommendations  AACE/ADA: New Consensus Statement on Inpatient Glycemic Control (2015)  Target Ranges:  Prepandial:   less than 140 mg/dL      Peak postprandial:   less than 180 mg/dL (1-2 hours)      Critically ill patients:  140 - 180 mg/dL   Results for STEPHAN, NELIS (MRN [id number]) as of 10/02/2015 08:31  Ref. Range 10/01/2015 07:44 10/01/2015 11:53 10/01/2015 17:07 10/01/2015 21:15 10/01/2015 21:37 10/01/2015 22:34  Glucose-Capillary Latest Ref Range: 65 - 99 mg/dL 220 (H) 154 (H) 134 (H) 64 (L) 79 105 (H)   Results for PASHA, BROAD (MRN [id number]) as of 10/02/2015 08:31  Ref. Range 10/02/2015 07:43  Glucose-Capillary Latest Ref Range: 65 - 99 mg/dL 98    Admit Hyperkalemia.   History: DM, Lung Cancer, CKD, Cocaine Use  Home DM Meds: 70/30 Insulin: 15 units bidwc if CBG >200       Metformin (not taking)  Current Insulin Orders: 70/30 Insulin 15 units bidwc         Novolog Moderate Correction Scale/ SSI (0-15 units) TID AC + HS      MD- Patient with mild Hypoglycemia last night at bedtime.    Please consider the following:  1. Reduce 70/30 Insulin to 13 units bidwc  2. Reduce Novolog SSI to Novolog Sensitive Correction Scale/ SSI (0-9 units) TID AC + HS (currently ordered as Moderate scale 0-15 units)     --Will follow patient during hospitalization--  Wyn Quaker RN, MSN, CDE Diabetes Coordinator Inpatient Glycemic Control Team Team Pager: [phone] (8a-5p)

## 2015-10-02 NOTE — Evaluation (Signed)
Physical Therapy Evaluation Patient Details Name: Keith Holloway MRN: [id number] DOB: [birth year] Today's Date: 10/02/2015   History of Present Illness  Keith Holloway is an 55 y.o. male with a PMH of non-small cell lung cancer, diagnosed April 2016, status post chemotherapy and immunotherapy with disease progressionand admitted with elevated potassium level of 8.0. PMH includes blindness, CHF, depression, arthritis   Clinical Impression  Pt admitted with above diagnosis. Pt currently with functional limitations due to the deficits listed below (see PT Problem List).  Pt will benefit from skilled PT to increase their independence and safety with mobility to allow discharge to the venue listed below.  Pt moving close to baseline level.  Pt not interested in follow up PT. Did educate pt on having wife with him with gait and he verbalized understanding and stated she has been with him when he is out.  Will follow acutely.     Follow Up Recommendations No PT follow up    Equipment Recommendations  None recommended by PT    Recommendations for Other Services       Precautions / Restrictions Precautions Precautions: Fall;Other (comment) Precaution Comments: Blindness Restrictions Weight Bearing Restrictions: No      Mobility  Bed Mobility Overal bed mobility: Modified Independent                Transfers Overall transfer level: Needs assistance   Transfers: Sit to/from Stand Sit to Stand: Supervision         General transfer comment: Pt able to power up without physical A.  S due to lines.  Ambulation/Gait Ambulation/Gait assistance: Supervision;Min guard Ambulation Distance (Feet): 250 Feet Assistive device:  (blindness (red/white) cane) Gait Pattern/deviations: Step-through pattern;Leaning posteriorly     General Gait Details: Pt ambulate with cane and tendency for posterior sway, but no LOB.  Stairs            Wheelchair Mobility    Modified  Rankin (Stroke Patients Only)       Balance Overall balance assessment: History of Falls;Needs assistance Sitting-balance support: Feet supported Sitting balance-Leahy Scale: Good     Standing balance support: Single extremity supported Standing balance-Leahy Scale: Fair                               Pertinent Vitals/Pain Pain Assessment: 0-10 Pain Score: 5  Pain Location: L ribs Pain Descriptors / Indicators: Sore Pain Intervention(s): Premedicated before session    Home Living Family/patient expects to be discharged to:: Private residence Living Arrangements: Spouse/significant other   Type of Home: House Home Access: Stairs to enter Entrance Stairs-Rails: Right;Left;Can reach both Entrance Stairs-Number of Steps: 3 Home Layout: Two level;Able to live on main level with bedroom/bathroom Home Equipment: Other (comment);Shower seat (blind cane)      Prior Function Level of Independence: Independent with assistive device(s)         Comments: with use of blind cane     Hand Dominance   Dominant Hand: Right    Extremity/Trunk Assessment   Upper Extremity Assessment: Overall WFL for tasks assessed           Lower Extremity Assessment: Overall WFL for tasks assessed      Cervical / Trunk Assessment: Normal  Communication   Communication: No difficulties  Cognition Arousal/Alertness: Awake/alert Behavior During Therapy: WFL for tasks assessed/performed Overall Cognitive Status: Within Functional Limits for tasks assessed  General Comments General comments (skin integrity, edema, etc.): Fell 3 weeks ago due to insulin being low    Exercises        Assessment/Plan    PT Assessment Patient needs continued PT services  PT Diagnosis Difficulty walking   PT Problem List Decreased activity tolerance;Decreased balance;Decreased mobility  PT Treatment Interventions DME instruction;Gait training;Stair  training;Functional mobility training;Therapeutic exercise;Therapeutic activities;Patient/family education   PT Goals (Current goals can be found in the Care Plan section) Acute Rehab PT Goals Patient Stated Goal: go home and start chemo again next week PT Goal Formulation: With patient Time For Goal Achievement: 10/08/15 Potential to Achieve Goals: Good    Frequency Min 3X/week   Barriers to discharge        Co-evaluation               End of Session Equipment Utilized During Treatment: Gait belt Activity Tolerance: Patient tolerated treatment well Patient left: in chair;with call bell/phone within reach;with chair alarm set Nurse Communication: Mobility status         Time: 7124-5809 PT Time Calculation (min) (ACUTE ONLY): 26 min   Charges:   PT Evaluation $PT Eval Low Complexity: 1 Procedure PT Treatments $Gait Training: 8-22 mins   PT G Codes:        Leinani Lisbon LUBECK 10/02/2015, 11:39 AM

## 2015-10-02 NOTE — Progress Notes (Signed)
Patient discharge instructions gone over at the bedside. Wife not present. Patient verbally understood the instructions and did not have any further questions.

## 2015-10-05 NOTE — Discharge Summary (Signed)
[facility] Discharge Summary   Patient: Keith Holloway VQQ:[id number]   PCP: [name], MD DOB: [birth year]   Date of admission: 09/30/2015   Date of discharge: 10/02/2015    Discharge Diagnoses:  Principal Problem:   Hyperkalemia Active Problems:   Essential hypertension, benign   Non-small cell carcinoma of lung, stage 4 (HCC)   Acute kidney injury ([place])   SIRS (systemic inflammatory response syndrome) ([place])   Admitted From: home Disposition:  home  Recommendations for Outpatient Follow-up:  1. Follow-up with PCP in 1 week with BMP to monitor potassium level. 2. Follow-up with oncology regarding chemotherapy.   Follow-up Information    [name], MD Follow up in 1 week(s).   Specialty:  Internal Medicine Why:  with BMP Contact information: [place] [address] [phone]        Eilleen Kempf., MD. Schedule an appointment as soon as possible for a visit in 1 week(s).   Specialty:  Oncology Contact information: [place] [address] [phone]          Diet recommendation: Cardiac diet  Activity: The patient is advised to gradually reintroduce usual activities.  Discharge Condition: good  Code Status: full code  History of present illness: As per the H and P dictated on admission, "Keith Holloway is an 55 y.o. male with a PMH of non-small cell lung cancer, diagnosed April 2016, status post chemotherapy and immunotherapy with disease progression, under the care of Dr. Julien Nordmann, who presented to the clinic for a follow-up visit today for routine labs to be done prior to receiving chemotherapy with docetaxel and Cyramza, but the scheduled dose of chemotherapy was held secondary to the finding of an elevated potassium level of 8.0.  He was transported to the ED for further evaluation. The patient endorses some fatigue/weakness, occasional shortness of breath, but no other significant symptoms. The patient admits to  cocaine use. Last used last week. "  Hospital Course:  Summary of his active problems in the hospital is as following. Principal Problem: Hyperkalemiawith peaked T waves Patient has chronic renal insufficiency/stage III chronic kidney disease which may be responsible for his hyperkalemia. Tumor lysis syndrome was also thought to be contributing, but phosphorus is low and magnesium is normal. Low potassium diet.   Active Problems: Systemic inflammatory response syndrome Tachycardia and tachypnea in this patient with a history of cocaine abuse and alcohol abuse likely related to that and not underlying infection.  The patient is afebrile and nontoxic appearing.  Essential hypertension, benign/acute kidney injury Hold losartan/HCTZ Patient was gently hydrate. Baseline creatinine 1.2. The patient stable despite holding antihypertensive medications.  Non-small cell carcinoma of lung, stage 4 (HCC) Chemotherapy held secondary to hyperkalemia. Was scheduled for a cycle of chemotherapy.  Cocaine abuse, history of alcohol abuse Monitor for systemic signs of withdrawal. Tachycardia and tachypnea may be related to acute cocaine exposure, urine drug screen. Ativan as needed.  Type 2 diabetes with ophthalmic complications Continue 22/02 twice a day. Add insulin sensitive SSI Q AC/HS.  Hyperlipidemia Continue Zocor.  All other chronic medical condition were stable during the hospitalization.  Patient was seen by physical therapy, who recommended no further therapy Requirement. On the day of the discharge the patient's potassium was stable, and no other acute medical condition were reported by patient. the patient was felt safe to be discharge at home with family.  Procedures and Results:  none   Consultations:  none  DISCHARGE MEDICATION: Discharge Medication List as  of 10/02/2015  2:15 PM    START taking these medications   Details  acetaminophen (TYLENOL) 325  MG tablet Take 2 tablets (650 mg total) by mouth every 6 (six) hours as needed for mild pain (or Fever >/= 101)., Starting Thu 10/02/2015, Normal      CONTINUE these medications which have NOT CHANGED   Details  aspirin EC 81 MG tablet Take 1 tablet by mouth every morning. , Historical Med    !! dexamethasone (DECADRON) 4 MG tablet 2 tablets by mouth twice a day the day before, day of and day after the chemotherapy every 3 weeks, Normal    dextrose ([facility] GLUCOSE) 40 % GEL Take by mouth., Until Discontinued, Historical Med    Ferrous Sulfate (IRON) 325 (65 Fe) MG TABS Take 1 tablet by mouth every morning. , Historical Med    HYDROcodone-acetaminophen (NORCO/VICODIN) 5-325 MG tablet Take 2 tablets by mouth every 6 (six) hours as needed., Starting Fri 09/12/2015, Print    insulin NPH-regular Human (NOVOLIN 70/30) (70-30) 100 UNIT/ML injection Inject 35 Units into the skin 2 (two) times daily with a meal., Starting 01/23/2014, Until Discontinued, Normal    lidocaine-prilocaine (EMLA) cream Apply 1 application topically as needed., Starting 02/03/2015, Until Discontinued, Normal    prochlorperazine (COMPAZINE) 10 MG tablet TAKE 1 TABLET (10 MG TOTAL) BY MOUTH EVERY 6 (SIX) HOURS AS NEEDED FOR NAUSEA OR VOMITING., Normal    simvastatin (ZOCOR) 40 MG tablet Take 1 tablet (40 mg total) by mouth daily., Starting 01/23/2014, Until Discontinued, Normal    vitamin C (ASCORBIC ACID) 500 MG tablet Take 500 mg by mouth daily., Until Discontinued, Historical Med    !! dexamethasone (DECADRON) 4 MG tablet 2 TABLETS BY MOUTH TWICE A DAY THE DAY BEFORE, DAY OF AND DAY AFTER THE CHEMOTHERAPY EVERY 3 WEEKS, Normal    metFORMIN (GLUCOPHAGE) 1000 MG tablet Take 1 tablet (1,000 mg total) by mouth 2 (two) times daily., Starting 01/23/2014, Until Discontinued, Normal    ondansetron (ZOFRAN) 8 MG tablet TAKE 1 TABLET EVERY 8 HOURS AS NEEDED FOR NAUSEA AND VOMITING, Normal    temazepam (RESTORIL) 30 MG capsule TAKE ONE  CAPSULE AT BEDTIME, Print     !! - Potential duplicate medications found. Please discuss with provider.    STOP taking these medications     benzonatate (TESSALON) 100 MG capsule      losartan-hydrochlorothiazide (HYZAAR) 100-25 MG per tablet        No Known Allergies Discharge Instructions    Diet - low sodium heart healthy    Complete by:  As directed   Diet Carb Modified    Complete by:  As directed   Discharge instructions    Complete by:  As directed   It is important that you read following instructions as well as go over your medication list with RN to help you understand your care after this hospitalization.  Discharge Instructions: Please follow-up with PCP in one week, get bmp in 1 week.  Please request your primary care physician to go over all Hospital Tests and Procedure/Radiological results at the follow up,  Please get all Hospital records sent to your PCP by signing hospital release before you go home.   Do not take more than prescribed Pain, Sleep and Anxiety Medications. You were cared for by a hospitalist during your hospital stay. If you have any questions about your discharge medications or the care you received while you were in the hospital after you are discharged,  you can call the unit and ask to speak with the hospitalist on call if the hospitalist that took care of you is not available.  Once you are discharged, your primary care physician will handle any further medical issues. Please note that NO REFILLS for any discharge medications will be authorized once you are discharged, as it is imperative that you return to your primary care physician (or establish a relationship with a primary care physician if you do not have one) for your aftercare needs so that they can reassess your need for medications and monitor your lab values. You Must read complete instructions/literature along with all the possible adverse reactions/side effects for all the Medicines you  take and that have been prescribed to you. Take any new Medicines after you have completely understood and accept all the possible adverse reactions/side effects. Wear Seat belts while driving.   Increase activity slowly    Complete by:  As directed     Discharge Exam: Filed Weights   10/01/15 0819  Weight: 79.4 kg (175 lb)   Vitals:   10/02/15 0524 10/02/15 0529  BP: (!) 138/96 126/77  Pulse: 81 72  Resp: 18   Temp: 98.6 F (37 C)    General: Appear in no distress, no Rash; Oral Mucosa moist. Cardiovascular: S1 and S2 Present, no Murmur, no JVD Respiratory: Bilateral Air entry present and Clear to Auscultation, no Crackles, no wheezes Abdomen: Bowel Sound present, Soft and no tenderness Extremities: no Pedal edema, no calf tenderness Neurology: Grossly no focal neuro deficit.  The results of significant diagnostics from this hospitalization (including imaging, microbiology, ancillary and laboratory) are listed below for reference.    Significant Diagnostic Studies: No results found.  Microbiology: Recent Results (from the past 240 hour(s))  MRSA PCR Screening     Status: None   Collection Time: 09/30/15  6:32 PM  Result Value Ref Range Status   MRSA by PCR NEGATIVE NEGATIVE Final    Comment:        The GeneXpert MRSA Assay (FDA approved for NASAL specimens only), is one component of a comprehensive MRSA colonization surveillance program. It is not intended to diagnose MRSA infection nor to guide or monitor treatment for MRSA infections.      Labs: CBC:  Recent Labs Lab 09/30/15 0955 09/30/15 1922 10/02/15 0425  WBC 13.9* 14.1* 9.6  NEUTROABS 12.6*  --   --   HGB 10.1* 8.3* 8.0*  HCT 32.5* 24.5* 24.1*  MCV 73.1* 70.0* 70.1*  PLT 352 305 834   Basic Metabolic Panel:  Recent Labs Lab 09/30/15 0955 09/30/15 1512 09/30/15 1922 10/01/15 0045 10/01/15 0345 10/01/15 1545 10/02/15 0425  NA 134*  --   --   --  138 138 140  K 8.0 No visable hemolysis,  recollected to verify*  --  6.9* 5.7* 5.4* 4.7 5.1  CL  --   --   --   --  114* 115* 116*  CO2 19*  --   --   --  19* 21* 21*  GLUCOSE 328*  --   --   --  263* 114* 94  BUN 24.7  --   --   --  21* 19 22*  CREATININE 1.7*  --  1.78*  --  1.48* 1.34* 1.41*  CALCIUM 9.1  --   --   --  8.4* 8.4* 8.3*  MG  --  1.7  --   --   --   --   --  PHOS  --  2.4*  --   --   --   --   --    Liver Function Tests:  Recent Labs Lab 09/30/15 0955  AST 15  ALT 16  ALKPHOS 85  BILITOT <0.30  PROT 6.7  ALBUMIN 3.3*   No results for input(s): LIPASE, AMYLASE in the last 168 hours. No results for input(s): AMMONIA in the last 168 hours. Cardiac Enzymes:  Recent Labs Lab 09/30/15 1512  CKTOTAL 116   BNP (last 3 results) No results for input(s): BNP in the last 8760 hours. CBG:  Recent Labs Lab 10/01/15 2115 10/01/15 2137 10/01/15 2234 10/02/15 0743 10/02/15 1159  GLUCAP 64* 79 105* 98 75   Time spent: 30 minutes  Signed:  Yasmen Cortner  [facility] 10/02/2015 , 5:22 PM

## 2015-10-07 ENCOUNTER — Other Ambulatory Visit (HOSPITAL_BASED_OUTPATIENT_CLINIC_OR_DEPARTMENT_OTHER): Payer: Medicare Other

## 2015-10-07 DIAGNOSIS — N182 Chronic kidney disease, stage 2 (mild): Secondary | ICD-10-CM

## 2015-10-07 DIAGNOSIS — C3432 Malignant neoplasm of lower lobe, left bronchus or lung: Secondary | ICD-10-CM | POA: Diagnosis not present

## 2015-10-07 DIAGNOSIS — Z5111 Encounter for antineoplastic chemotherapy: Secondary | ICD-10-CM

## 2015-10-07 DIAGNOSIS — Z79899 Other long term (current) drug therapy: Secondary | ICD-10-CM

## 2015-10-07 DIAGNOSIS — C3492 Malignant neoplasm of unspecified part of left bronchus or lung: Secondary | ICD-10-CM

## 2015-10-07 LAB — CBC WITH DIFFERENTIAL/PLATELET
BASO%: 0.4 % (ref 0.0–2.0)
Basophils Absolute: 0 10*3/uL (ref 0.0–0.1)
EOS%: 1.8 % (ref 0.0–7.0)
Eosinophils Absolute: 0.1 10*3/uL (ref 0.0–0.5)
HEMATOCRIT: 26.7 % — AB (ref 38.4–49.9)
HGB: 8.7 g/dL — ABNORMAL LOW (ref 13.0–17.1)
LYMPH%: 21.1 % (ref 14.0–49.0)
MCH: 23.3 pg — AB (ref 27.2–33.4)
MCHC: 32.6 g/dL (ref 32.0–36.0)
MCV: 71.6 fL — ABNORMAL LOW (ref 79.3–98.0)
MONO#: 0.6 10*3/uL (ref 0.1–0.9)
MONO%: 11.2 % (ref 0.0–14.0)
NEUT%: 65.5 % (ref 39.0–75.0)
NEUTROS ABS: 3.7 10*3/uL (ref 1.5–6.5)
Platelets: 174 10*3/uL (ref 140–400)
RBC: 3.73 10*6/uL — AB (ref 4.20–5.82)
RDW: 18.8 % — ABNORMAL HIGH (ref 11.0–14.6)
WBC: 5.7 10*3/uL (ref 4.0–10.3)
lymph#: 1.2 10*3/uL (ref 0.9–3.3)
nRBC: 0 % (ref 0–0)

## 2015-10-07 LAB — COMPREHENSIVE METABOLIC PANEL
ALT: 12 U/L (ref 0–55)
ANION GAP: 5 meq/L (ref 3–11)
AST: 15 U/L (ref 5–34)
Albumin: 3 g/dL — ABNORMAL LOW (ref 3.5–5.0)
Alkaline Phosphatase: 66 U/L (ref 40–150)
BUN: 14.7 mg/dL (ref 7.0–26.0)
CALCIUM: 8.9 mg/dL (ref 8.4–10.4)
CHLORIDE: 114 meq/L — AB (ref 98–109)
CO2: 23 meq/L (ref 22–29)
CREATININE: 1.2 mg/dL (ref 0.7–1.3)
EGFR: 80 mL/min/{1.73_m2} — ABNORMAL LOW (ref >90)
Glucose: 129 mg/dl (ref 70–140)
POTASSIUM: 5.2 meq/L — AB (ref 3.5–5.1)
Sodium: 142 mEq/L (ref 136–145)
Total Bilirubin: <0.3 mg/dL (ref 0.20–1.20)
Total Protein: 6.2 g/dL — ABNORMAL LOW (ref 6.4–8.3)

## 2015-10-07 LAB — UA PROTEIN, DIPSTICK - CHCC: PROTEIN: 30 mg/dL

## 2015-10-08 ENCOUNTER — Other Ambulatory Visit: Payer: Self-pay | Admitting: Internal Medicine

## 2015-10-09 ENCOUNTER — Ambulatory Visit (HOSPITAL_COMMUNITY)
Admission: RE | Admit: 2015-10-09 | Discharge: 2015-10-09 | Disposition: A | Discharge status: Home / Self Care | Payer: Medicare Other | Source: Ambulatory Visit | Attending: Internal Medicine | Admitting: Internal Medicine

## 2015-10-09 DIAGNOSIS — J90 Pleural effusion, not elsewhere classified: Secondary | ICD-10-CM | POA: Diagnosis not present

## 2015-10-09 DIAGNOSIS — R601 Generalized edema: Secondary | ICD-10-CM | POA: Insufficient documentation

## 2015-10-09 DIAGNOSIS — R590 Localized enlarged lymph nodes: Secondary | ICD-10-CM | POA: Diagnosis not present

## 2015-10-09 DIAGNOSIS — C3492 Malignant neoplasm of unspecified part of left bronchus or lung: Secondary | ICD-10-CM | POA: Diagnosis present

## 2015-10-09 DIAGNOSIS — M899 Disorder of bone, unspecified: Secondary | ICD-10-CM | POA: Diagnosis not present

## 2015-10-09 DIAGNOSIS — N182 Chronic kidney disease, stage 2 (mild): Secondary | ICD-10-CM | POA: Insufficient documentation

## 2015-10-09 DIAGNOSIS — K769 Liver disease, unspecified: Secondary | ICD-10-CM | POA: Insufficient documentation

## 2015-10-09 DIAGNOSIS — Z5111 Encounter for antineoplastic chemotherapy: Secondary | ICD-10-CM

## 2015-10-09 DIAGNOSIS — I700 Atherosclerosis of aorta: Secondary | ICD-10-CM | POA: Diagnosis not present

## 2015-10-10 ENCOUNTER — Telehealth: Payer: Self-pay | Admitting: *Deleted

## 2015-10-10 DIAGNOSIS — C3492 Malignant neoplasm of unspecified part of left bronchus or lung: Secondary | ICD-10-CM

## 2015-10-10 MED ORDER — HYDROCODONE-ACETAMINOPHEN 5-325 MG PO TABS: 2.0000 | ORAL_TABLET | Freq: Four times a day (QID) | ORAL | 0 refills | Status: DC | PRN

## 2015-10-10 NOTE — Telephone Encounter (Signed)
Call from pt wife request for refill on pain medication. Rx will be ready Monday. Wife verbalized understanding.

## 2015-10-14 ENCOUNTER — Ambulatory Visit: Payer: Medicare Other | Admitting: Internal Medicine

## 2015-10-14 ENCOUNTER — Other Ambulatory Visit (HOSPITAL_BASED_OUTPATIENT_CLINIC_OR_DEPARTMENT_OTHER): Payer: Medicare Other

## 2015-10-14 ENCOUNTER — Other Ambulatory Visit: Payer: Self-pay | Admitting: General Surgery

## 2015-10-14 ENCOUNTER — Ambulatory Visit: Payer: Medicare Other

## 2015-10-14 DIAGNOSIS — Z5111 Encounter for antineoplastic chemotherapy: Secondary | ICD-10-CM

## 2015-10-14 DIAGNOSIS — C3432 Malignant neoplasm of lower lobe, left bronchus or lung: Secondary | ICD-10-CM

## 2015-10-14 DIAGNOSIS — N182 Chronic kidney disease, stage 2 (mild): Secondary | ICD-10-CM

## 2015-10-14 DIAGNOSIS — C3492 Malignant neoplasm of unspecified part of left bronchus or lung: Secondary | ICD-10-CM

## 2015-10-14 LAB — COMPREHENSIVE METABOLIC PANEL
ALBUMIN: 3.3 g/dL — AB (ref 3.5–5.0)
ALK PHOS: 82 U/L (ref 40–150)
ALT: 14 U/L (ref 0–55)
AST: 20 U/L (ref 5–34)
Anion Gap: 7 mEq/L (ref 3–11)
BILIRUBIN TOTAL: 0.44 mg/dL (ref 0.20–1.20)
BUN: 12.8 mg/dL (ref 7.0–26.0)
CALCIUM: 9.2 mg/dL (ref 8.4–10.4)
CO2: 24 mEq/L (ref 22–29)
Chloride: 114 mEq/L — ABNORMAL HIGH (ref 98–109)
Creatinine: 1.2 mg/dL (ref 0.7–1.3)
EGFR: 80 mL/min/{1.73_m2} — AB (ref >90)
GLUCOSE: 151 mg/dL — AB (ref 70–140)
Potassium: 4.7 mEq/L (ref 3.5–5.1)
SODIUM: 145 meq/L (ref 136–145)
TOTAL PROTEIN: 6.6 g/dL (ref 6.4–8.3)

## 2015-10-14 LAB — CBC WITH DIFFERENTIAL/PLATELET
BASO%: 0.8 % (ref 0.0–2.0)
BASOS ABS: 0 10*3/uL (ref 0.0–0.1)
EOS ABS: 0.1 10*3/uL (ref 0.0–0.5)
EOS%: 1.8 % (ref 0.0–7.0)
HEMATOCRIT: 31.8 % — AB (ref 38.4–49.9)
HEMOGLOBIN: 9.9 g/dL — AB (ref 13.0–17.1)
LYMPH#: 1 10*3/uL (ref 0.9–3.3)
LYMPH%: 20.6 % (ref 14.0–49.0)
MCH: 23 pg — ABNORMAL LOW (ref 27.2–33.4)
MCHC: 31 g/dL — ABNORMAL LOW (ref 32.0–36.0)
MCV: 74.2 fL — AB (ref 79.3–98.0)
MONO#: 0.4 10*3/uL (ref 0.1–0.9)
MONO%: 8.6 % (ref 0.0–14.0)
NEUT%: 68.2 % (ref 39.0–75.0)
NEUTROS ABS: 3.4 10*3/uL (ref 1.5–6.5)
Platelets: 177 10*3/uL (ref 140–400)
RBC: 4.29 10*6/uL (ref 4.20–5.82)
RDW: 17.9 % — AB (ref 11.0–14.6)
WBC: 5 10*3/uL (ref 4.0–10.3)

## 2015-10-14 NOTE — H&P (Signed)
History of Present Illness Leighton Ruff MD; 06/09/9561 12:38 PM) Patient words: pil. cyst.  The patient is a 55 year old male who presents with a pilonidal cyst. 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 He stated that these began when he started chemotherapy for stage IV lung cancer. He receives treatments approximately every 3 weeks. Following the treatment he gets an abscess which usually self resolves with antibiotics. He states that this instance has become significant with severe pain. He also has low back pain as a result. He had been placed on Bactrim by his oncologist but unfortunately the area persists. He has placed warm compresses to the area with little resolution. Approximately 3 weeks ago he underwent an I&D of the abscess. It has continued to drain purulence for the past few weeks. Of note is been off of his chemotherapy due to a high potassium level.   Problem List/Past Medical Leighton Ruff, MD; 8/75/6433 12:39 PM) PILONIDAL ABSCESS (L05.01)  Other Problems Leighton Ruff, MD; 2/95/1884 12:39 PM) Lung Cancer High blood pressure Hypercholesterolemia Diabetes Mellitus  Past Surgical History Leighton Ruff, MD; 1/66/0630 12:39 PM) Cataract Surgery Bilateral.  Diagnostic Studies History Leighton Ruff, MD; 1/60/1093 12:39 PM) Colonoscopy 1-5 years ago  Allergies Mammie Lorenzo, LPN; 2/35/5732 2:02 AM) No Known Drug Allergies08/04/2015  Medication History Leighton Ruff, MD; 5/42/7062 12:39 PM) Aspirin ('81MG'$  Tablet, Oral) Active. Medications Reconciled Hydrocodone-Acetaminophen (5-'325MG'$  Tablet, Oral) Active. Simvastatin ('40MG'$  Tablet, Oral) Active. Losartan Potassium-HCTZ (100-'25MG'$  Tablet, Oral) Active. NovoLIN 70/30 ((70-30) 100UNIT/ML Suspension, Subcutaneous) Active. Vitamin D (Cholecalciferol) (1000UNIT Capsule, Oral)  Active. Vitamin C ('500MG'$  Capsule, Oral) Active. Iron (325 (65 Fe)MG Tablet, Oral) Active.  Social History Leighton Ruff, MD; 3/76/2831 12:39 PM) Alcohol use Occasional alcohol use. Tobacco use Current every day smoker. Caffeine use Coffee. Illicit drug use Uses socially only.  Family History Leighton Ruff, MD; 07/02/6158 12:39 PM) Alcohol Abuse Brother, Sister. Cancer Brother. Hypertension Sister. Heart Disease Father. Cerebrovascular Accident Brother.    Review of Systems Leighton Ruff MD; 7/37/1062 12:39 PM) General Present- Fatigue. Not Present- Appetite Loss, Chills, Fever, Night Sweats, Weight Gain and Weight Loss. Skin Not Present- Change in Wart/Mole, Dryness, Hives, Jaundice, New Lesions, Non-Healing Wounds, Rash and Ulcer. HEENT Present- Visual Disturbances. Not Present- Earache, Hearing Loss, Hoarseness, Nose Bleed, Oral Ulcers, Ringing in the Ears, Seasonal Allergies, Sinus Pain, Sore Throat, Wears glasses/contact lenses and Yellow Eyes. Respiratory Not Present- Bloody sputum, Chronic Cough, Difficulty Breathing, Snoring and Wheezing. Breast Not Present- Breast Mass, Breast Pain, Nipple Discharge and Skin Changes. Cardiovascular Present- Shortness of Breath. Not Present- Chest Pain, Difficulty Breathing Lying Down, Leg Cramps, Palpitations, Rapid Heart Rate and Swelling of Extremities. Gastrointestinal Not Present- Abdominal Pain, Bloating, Bloody Stool, Change in Bowel Habits, Chronic diarrhea, Constipation, Difficulty Swallowing, Excessive gas, Gets full quickly at meals, Hemorrhoids, Indigestion, Nausea, Rectal Pain and Vomiting. Male Genitourinary Not Present- Blood in Urine, Change in Urinary Stream, Frequency, Impotence, Nocturia, Painful Urination, Urgency and Urine Leakage. Musculoskeletal Present- Back Pain. Not Present- Joint Pain, Joint Stiffness, Muscle Pain, Muscle Weakness and Swelling of Extremities. Neurological Not Present- Decreased Memory,  Fainting, Headaches, Numbness, Seizures, Tingling, Tremor, Trouble walking and Weakness. Psychiatric Not Present- Anxiety, Bipolar, Change in Sleep Pattern, Depression, Fearful and Frequent crying. Endocrine Not Present- Cold Intolerance, Excessive Hunger, Hair Changes, Heat Intolerance and New Diabetes. Hematology Present- Persistent Infections. Not Present- Blood Thinners, Easy Bruising, Excessive bleeding, Gland problems  and HIV.  Vitals Claiborne Billings Dockery LPN; 0/30/0923 3:00 AM) 10/07/2015 9:37 AM Weight: 189 lb Height: 71in Body Surface Area: 2.06 m Body Mass Index: 26.36 kg/m  Temp.: 56F(Oral)  Pulse: 64 (Regular)  BP: 128/86 (Sitting, Left Arm, Standard)       Physical Exam Leighton Ruff MD; 7/62/2633 12:39 PM) General Mental Status-Alert. General Appearance-Cooperative, Not in acute distress. Orientation-Oriented X4.  Integumentary General Characteristics Overall examination of the patient's skin reveals - no rashes. Color - normal coloration of skin. Skin Moisture - normal skin moisture.  Head and Neck Head-normocephalic, atraumatic with no lesions or palpable masses.  Chest and Lung Exam Chest and lung exam reveals -quiet, even and easy respiratory effort with no use of accessory muscles.  Rectal Note: There is a mildly tender, fluctuant area to the pilonidal crease just to the left of midline. There is a small pilonidal pit at midline   Neurologic Neurologic evaluation reveals -normal attention span and ability to concentrate and able to name objects and repeat phrases. Appropriate fund of knowledge .  Musculoskeletal Global Assessment Gait and Station - normal gait and station and normal posture.    Assessment & Plan Leighton Ruff MD; 3/54/5625 10:07 AM) PILONIDAL ABSCESS (L05.01) Impression: 55 year old male who presents to the office for evaluation of a recurrent pilonidal abscess. He underwent I&D of this approximately 2 weeks  ago. This has been recurring for the past 8 months or so. He is on chemotherapy for lung cancer. On exam he has an obvious pilonidal pit with inflammation on the left side of his gluteus. I have recommended excision of this area. I will discuss this with his oncologist prior to scheduling surgery.

## 2015-10-15 ENCOUNTER — Encounter (HOSPITAL_BASED_OUTPATIENT_CLINIC_OR_DEPARTMENT_OTHER): Payer: Self-pay | Admitting: *Deleted

## 2015-10-15 NOTE — Progress Notes (Signed)
SPOKE W/ PT'S WIFE AND PT VIA SPEAKER PHONE.  PT IS LEGALLY BLIND WILL NEED WIFE IN PRE-OP.  NPO AFTER MN.  ARRIVE AT 0930.  CURRENT LAB RESULTS AND EKG IN CHART AND EPIC.   REVIEWED CHART W/ DR DENENNY MDA,  STATED OK TO PROCEED AND ASSESSED DOS AND ASK MDA DOS IF URINE DRUG SCREEN NEEDED .  LAST UDS DONE 09-30-2015 POSITIVE FOR COCAINE.

## 2015-10-16 ENCOUNTER — Ambulatory Visit: Payer: Medicare Other

## 2015-10-17 ENCOUNTER — Ambulatory Visit (HOSPITAL_BASED_OUTPATIENT_CLINIC_OR_DEPARTMENT_OTHER): Payer: Medicare Other | Admitting: Anesthesiology

## 2015-10-17 ENCOUNTER — Encounter (HOSPITAL_BASED_OUTPATIENT_CLINIC_OR_DEPARTMENT_OTHER)
Admission: RE | Disposition: A | Discharge status: Home / Self Care | Payer: Self-pay | Source: Ambulatory Visit | Attending: General Surgery

## 2015-10-17 ENCOUNTER — Ambulatory Visit (HOSPITAL_BASED_OUTPATIENT_CLINIC_OR_DEPARTMENT_OTHER)
Admission: RE | Admit: 2015-10-17 | Discharge: 2015-10-17 | Disposition: A | Discharge status: Home / Self Care | Payer: Medicare Other | Source: Ambulatory Visit | Attending: General Surgery | Admitting: General Surgery

## 2015-10-17 ENCOUNTER — Encounter (HOSPITAL_BASED_OUTPATIENT_CLINIC_OR_DEPARTMENT_OTHER): Payer: Self-pay | Admitting: Anesthesiology

## 2015-10-17 DIAGNOSIS — Z7982 Long term (current) use of aspirin: Secondary | ICD-10-CM | POA: Diagnosis not present

## 2015-10-17 DIAGNOSIS — E7800 Pure hypercholesterolemia, unspecified: Secondary | ICD-10-CM | POA: Diagnosis not present

## 2015-10-17 DIAGNOSIS — C3490 Malignant neoplasm of unspecified part of unspecified bronchus or lung: Secondary | ICD-10-CM | POA: Insufficient documentation

## 2015-10-17 DIAGNOSIS — I110 Hypertensive heart disease with heart failure: Secondary | ICD-10-CM | POA: Diagnosis not present

## 2015-10-17 DIAGNOSIS — J449 Chronic obstructive pulmonary disease, unspecified: Secondary | ICD-10-CM | POA: Insufficient documentation

## 2015-10-17 DIAGNOSIS — I509 Heart failure, unspecified: Secondary | ICD-10-CM | POA: Diagnosis not present

## 2015-10-17 DIAGNOSIS — Z794 Long term (current) use of insulin: Secondary | ICD-10-CM | POA: Insufficient documentation

## 2015-10-17 DIAGNOSIS — F17200 Nicotine dependence, unspecified, uncomplicated: Secondary | ICD-10-CM | POA: Insufficient documentation

## 2015-10-17 DIAGNOSIS — M1990 Unspecified osteoarthritis, unspecified site: Secondary | ICD-10-CM | POA: Insufficient documentation

## 2015-10-17 DIAGNOSIS — C787 Secondary malignant neoplasm of liver and intrahepatic bile duct: Secondary | ICD-10-CM | POA: Insufficient documentation

## 2015-10-17 DIAGNOSIS — L0591 Pilonidal cyst without abscess: Secondary | ICD-10-CM | POA: Diagnosis present

## 2015-10-17 DIAGNOSIS — Z79899 Other long term (current) drug therapy: Secondary | ICD-10-CM | POA: Insufficient documentation

## 2015-10-17 DIAGNOSIS — C3492 Malignant neoplasm of unspecified part of left bronchus or lung: Secondary | ICD-10-CM

## 2015-10-17 DIAGNOSIS — E119 Type 2 diabetes mellitus without complications: Secondary | ICD-10-CM | POA: Diagnosis not present

## 2015-10-17 DIAGNOSIS — D649 Anemia, unspecified: Secondary | ICD-10-CM | POA: Insufficient documentation

## 2015-10-17 DIAGNOSIS — L0501 Pilonidal cyst with abscess: Secondary | ICD-10-CM | POA: Insufficient documentation

## 2015-10-17 HISTORY — DX: Pilonidal cyst with abscess: L05.01

## 2015-10-17 HISTORY — DX: Chronic kidney disease, stage 3 unspecified: N18.30

## 2015-10-17 HISTORY — DX: Type 2 diabetes mellitus without complications: E11.9

## 2015-10-17 HISTORY — DX: Dyspnea, unspecified: R06.00

## 2015-10-17 HISTORY — DX: Pleural effusion, not elsewhere classified: J90

## 2015-10-17 HISTORY — DX: Personal history of other endocrine, nutritional and metabolic disease: Z86.39

## 2015-10-17 HISTORY — DX: Cocaine abuse, uncomplicated: F14.10

## 2015-10-17 HISTORY — DX: Personal history of other (healed) physical injury and trauma: Z87.828

## 2015-10-17 HISTORY — DX: Other nonspecific abnormal finding of lung field: R91.8

## 2015-10-17 HISTORY — DX: Presence of other vascular implants and grafts: Z95.828

## 2015-10-17 HISTORY — DX: Personal history of other diseases of urinary system: Z87.448

## 2015-10-17 HISTORY — DX: Hyperlipidemia, unspecified: E78.5

## 2015-10-17 HISTORY — DX: Legal blindness, as defined in USA: H54.8

## 2015-10-17 HISTORY — DX: Malignant neoplasm of unspecified part of unspecified bronchus or lung: C34.90

## 2015-10-17 HISTORY — DX: Chronic kidney disease, stage 3 (moderate): N18.3

## 2015-10-17 HISTORY — PX: PILONIDAL CYST EXCISION: SHX744

## 2015-10-17 HISTORY — DX: Presence of dental prosthetic device (complete) (partial): Z97.2

## 2015-10-17 HISTORY — DX: Other forms of dyspnea: R06.09

## 2015-10-17 HISTORY — DX: Elevated blood-pressure reading, without diagnosis of hypertension: R03.0

## 2015-10-17 HISTORY — DX: Type 2 diabetes mellitus without complications: Z79.4

## 2015-10-17 HISTORY — DX: Legal blindness, as defined in USA: E11.319

## 2015-10-17 HISTORY — DX: Nocturia: R35.1

## 2015-10-17 SURGERY — EXCISION, PILONIDAL CYST, EXTENSIVE
Anesthesia: Monitor Anesthesia Care | Site: Rectum

## 2015-10-17 MED ORDER — SODIUM CHLORIDE 0.9 % IV SOLN
INTRAVENOUS | Status: DC
  Administered 2015-10-17: 11:00:00 via INTRAVENOUS
  Filled 2015-10-17: qty 1000

## 2015-10-17 MED ORDER — CEFAZOLIN SODIUM-DEXTROSE 2-4 GM/100ML-% IV SOLN
2.0000 g | INTRAVENOUS | Status: AC
  Administered 2015-10-17: 2 g via INTRAVENOUS
  Filled 2015-10-17: qty 100

## 2015-10-17 MED ORDER — FENTANYL CITRATE (PF) 100 MCG/2ML IJ SOLN
25.0000 ug | INTRAMUSCULAR | Status: DC | PRN
  Filled 2015-10-17: qty 1

## 2015-10-17 MED ORDER — FENTANYL CITRATE (PF) 100 MCG/2ML IJ SOLN
INTRAMUSCULAR | Status: DC | PRN
  Administered 2015-10-17 (×2): 25 ug via INTRAVENOUS

## 2015-10-17 MED ORDER — MIDAZOLAM HCL 5 MG/5ML IJ SOLN
INTRAMUSCULAR | Status: DC | PRN
  Administered 2015-10-17: 2 mg via INTRAVENOUS

## 2015-10-17 MED ORDER — ACETAMINOPHEN 500 MG PO TABS
1000.0000 mg | ORAL_TABLET | ORAL | Status: AC
  Administered 2015-10-17: 1000 mg via ORAL
  Filled 2015-10-17: qty 2

## 2015-10-17 MED ORDER — MIDAZOLAM HCL 2 MG/2ML IJ SOLN
0.5000 mg | Freq: Once | INTRAMUSCULAR | Status: DC | PRN
  Filled 2015-10-17: qty 2

## 2015-10-17 MED ORDER — ACETAMINOPHEN 500 MG PO TABS
ORAL_TABLET | ORAL | Status: AC
  Filled 2015-10-17: qty 2

## 2015-10-17 MED ORDER — HYDROCODONE-ACETAMINOPHEN 5-325 MG PO TABS: 2.0000 | ORAL_TABLET | Freq: Four times a day (QID) | ORAL | 0 refills | Status: DC | PRN

## 2015-10-17 MED ORDER — DEXAMETHASONE SODIUM PHOSPHATE 4 MG/ML IJ SOLN
INTRAMUSCULAR | Status: DC | PRN
  Administered 2015-10-17: 5 mg via INTRAVENOUS

## 2015-10-17 MED ORDER — FENTANYL CITRATE (PF) 100 MCG/2ML IJ SOLN
INTRAMUSCULAR | Status: AC
  Filled 2015-10-17: qty 2

## 2015-10-17 MED ORDER — PROMETHAZINE HCL 25 MG/ML IJ SOLN
6.2500 mg | INTRAMUSCULAR | Status: DC | PRN
Start: 2015-10-17 — End: 2015-10-17
  Filled 2015-10-17: qty 1

## 2015-10-17 MED ORDER — MIDAZOLAM HCL 2 MG/2ML IJ SOLN
INTRAMUSCULAR | Status: AC
  Filled 2015-10-17: qty 2

## 2015-10-17 MED ORDER — CEFAZOLIN SODIUM-DEXTROSE 2-4 GM/100ML-% IV SOLN
INTRAVENOUS | Status: AC
  Filled 2015-10-17: qty 100

## 2015-10-17 MED ORDER — BUPIVACAINE-EPINEPHRINE 0.5% -1:200000 IJ SOLN
INTRAMUSCULAR | Status: DC | PRN
  Administered 2015-10-17: 30 mL

## 2015-10-17 MED ORDER — MEPERIDINE HCL 25 MG/ML IJ SOLN
6.2500 mg | INTRAMUSCULAR | Status: DC | PRN
  Filled 2015-10-17: qty 1

## 2015-10-17 MED ORDER — PROPOFOL 500 MG/50ML IV EMUL
INTRAVENOUS | Status: DC | PRN
  Administered 2015-10-17: 200 ug/kg/min via INTRAVENOUS

## 2015-10-17 MED ORDER — LIDOCAINE HCL (CARDIAC) 20 MG/ML IV SOLN
INTRAVENOUS | Status: DC | PRN
  Administered 2015-10-17: 50 mg via INTRAVENOUS

## 2015-10-17 MED ORDER — ONDANSETRON HCL 4 MG/2ML IJ SOLN
INTRAMUSCULAR | Status: DC | PRN
  Administered 2015-10-17: 4 mg via INTRAVENOUS

## 2015-10-17 SURGICAL SUPPLY — 64 items
APL SKNCLS STERI-STRIP NONHPOA (GAUZE/BANDAGES/DRESSINGS) ×1
BENZOIN TINCTURE PRP APPL 2/3 (GAUZE/BANDAGES/DRESSINGS) ×3 IMPLANT
BLADE HEX COATED 2.75 (ELECTRODE) ×3 IMPLANT
BLADE SURG 10 STRL SS (BLADE) IMPLANT
BLADE SURG 15 STRL LF DISP TIS (BLADE) ×1 IMPLANT
BLADE SURG 15 STRL SS (BLADE) ×3
BRIEF STRETCH FOR OB PAD LRG (UNDERPADS AND DIAPERS) ×3 IMPLANT
CANISTER SUCTION 2500CC (MISCELLANEOUS) ×3 IMPLANT
COVER BACK TABLE 60X90IN (DRAPES) ×3 IMPLANT
COVER MAYO STAND STRL (DRAPES) ×3 IMPLANT
DECANTER SPIKE VIAL GLASS SM (MISCELLANEOUS) ×1 IMPLANT
DRAIN PENROSE 18X1/4 LTX STRL (WOUND CARE) ×1 IMPLANT
DRAPE LAPAROTOMY 100X72 PEDS (DRAPES) ×3 IMPLANT
DRAPE LG THREE QUARTER DISP (DRAPES) ×6 IMPLANT
DRAPE UTILITY XL STRL (DRAPES) ×3 IMPLANT
DRSG PAD ABDOMINAL 8X10 ST (GAUZE/BANDAGES/DRESSINGS) ×2 IMPLANT
ELECT BLADE 6.5 .24CM SHAFT (ELECTRODE) IMPLANT
ELECT REM PT RETURN 9FT ADLT (ELECTROSURGICAL) ×3
ELECTRODE REM PT RTRN 9FT ADLT (ELECTROSURGICAL) ×1 IMPLANT
GAUZE SPONGE 4X4 16PLY XRAY LF (GAUZE/BANDAGES/DRESSINGS) IMPLANT
GAUZE VASELINE 3X9 (GAUZE/BANDAGES/DRESSINGS) IMPLANT
GLOVE BIO SURGEON STRL SZ 6.5 (GLOVE) ×4 IMPLANT
GLOVE BIO SURGEONS STRL SZ 6.5 (GLOVE) ×2
GLOVE BIOGEL M 6.5 STRL (GLOVE) ×2 IMPLANT
GLOVE BIOGEL PI IND STRL 6.5 (GLOVE) IMPLANT
GLOVE BIOGEL PI IND STRL 7.5 (GLOVE) IMPLANT
GLOVE BIOGEL PI INDICATOR 6.5 (GLOVE) ×2
GLOVE BIOGEL PI INDICATOR 7.5 (GLOVE) ×2
GLOVE INDICATOR 7.0 STRL GRN (GLOVE) ×6 IMPLANT
GOWN STRL REUS TWL 2XL XL LVL4 (GOWN DISPOSABLE) ×2 IMPLANT
GOWN STRL REUS W/ TWL LRG LVL3 (GOWN DISPOSABLE) ×1 IMPLANT
GOWN STRL REUS W/ TWL XL LVL3 (GOWN DISPOSABLE) ×2 IMPLANT
GOWN STRL REUS W/TWL LRG LVL3 (GOWN DISPOSABLE) ×2 IMPLANT
GOWN STRL REUS W/TWL XL LVL3 (GOWN DISPOSABLE)
KIT ROOM TURNOVER WOR (KITS) ×3 IMPLANT
MANIFOLD NEPTUNE II (INSTRUMENTS) IMPLANT
NDL SAFETY ECLIPSE 18X1.5 (NEEDLE) IMPLANT
NEEDLE HYPO 18GX1.5 SHARP (NEEDLE)
NEEDLE HYPO 22GX1.5 SAFETY (NEEDLE) ×3 IMPLANT
NS IRRIG 500ML POUR BTL (IV SOLUTION) ×3 IMPLANT
PACK BASIN DAY SURGERY FS (CUSTOM PROCEDURE TRAY) ×3 IMPLANT
PAD ABD 8X10 STRL (GAUZE/BANDAGES/DRESSINGS) IMPLANT
PAD ARMBOARD 7.5X6 YLW CONV (MISCELLANEOUS) IMPLANT
PENCIL BUTTON HOLSTER BLD 10FT (ELECTRODE) ×3 IMPLANT
SPONGE GAUZE 4X4 12PLY (GAUZE/BANDAGES/DRESSINGS) IMPLANT
SPONGE GAUZE 4X4 12PLY STER LF (GAUZE/BANDAGES/DRESSINGS) ×2 IMPLANT
SPONGE LAP 18X18 X RAY DECT (DISPOSABLE) IMPLANT
SPONGE LAP 4X18 X RAY DECT (DISPOSABLE) IMPLANT
SPONGE SURGIFOAM ABS GEL 12-7 (HEMOSTASIS) IMPLANT
SUT CHROMIC 2 0 SH (SUTURE) IMPLANT
SUT CHROMIC 3 0 SH 27 (SUTURE) IMPLANT
SUT ETHILON 2 0 PS N (SUTURE) ×2 IMPLANT
SUT ETHILON 3 0 PS 1 (SUTURE) IMPLANT
SUT VIC AB 2-0 SH 18 (SUTURE) ×5 IMPLANT
SUT VIC AB 3-0 SH 18 (SUTURE) ×2 IMPLANT
SUT VIC AB 4-0 P-3 18XBRD (SUTURE) IMPLANT
SUT VIC AB 4-0 P3 18 (SUTURE)
SYR BULB IRRIGATION 50ML (SYRINGE) ×3 IMPLANT
SYR CONTROL 10ML LL (SYRINGE) ×3 IMPLANT
TOWEL OR 17X24 6PK STRL BLUE (TOWEL DISPOSABLE) ×6 IMPLANT
TRAY DSU PREP LF (CUSTOM PROCEDURE TRAY) ×3 IMPLANT
TUBE CONNECTING 12'X1/4 (SUCTIONS) ×1
TUBE CONNECTING 12X1/4 (SUCTIONS) ×2 IMPLANT
YANKAUER SUCT BULB TIP NO VENT (SUCTIONS) ×3 IMPLANT

## 2015-10-17 NOTE — Anesthesia Procedure Notes (Signed)
Procedure Name: MAC Date/Time: 10/17/2015 12:07 PM Performed by: Wanita Chamberlain Pre-anesthesia Checklist: Patient identified, Timeout performed, Emergency Drugs available, Suction available and Patient being monitored Patient Re-evaluated:Patient Re-evaluated prior to inductionOxygen Delivery Method: Nasal cannula Intubation Type: IV induction Placement Confirmation: positive ETCO2 and breath sounds checked- equal and bilateral Dental Injury: Teeth and Oropharynx as per pre-operative assessment

## 2015-10-17 NOTE — Interval H&P Note (Signed)
History and Physical Interval Note:  10/17/2015 12:02 PM  Keith Holloway  has presented today for surgery, with the diagnosis of pilonidal disease  The various methods of treatment have been discussed with the patient and family. After consideration of risks, benefits and other options for treatment, the patient has consented to  Procedure(s): EXCISION PILONIDAL DISEASE (N/A) as a surgical intervention .  The patient's history has been reviewed, patient examined, no change in status, stable for surgery.  I have reviewed the patient's chart and labs.  Questions were answered to the patient's satisfaction.     Rosario Adie, MD  [facility]

## 2015-10-17 NOTE — Op Note (Signed)
10/17/2015  1:25 PM  PATIENT:  Keith Holloway  55 y.o. male  Patient Care Team: [name], MD as PCP - General (Internal Medicine)  PRE-OPERATIVE DIAGNOSIS:  pilonidal disease  POST-OPERATIVE DIAGNOSIS:  pilonidal disease  PROCEDURE:   EXCISION PILONIDAL DISEASE   Surgeon(s): Leighton Ruff, MD  ASSISTANT: none   ANESTHESIA:   local and MAC  SPECIMEN:  Source of Specimen:  pilonidal cyst  DISPOSITION OF SPECIMEN:  PATHOLOGY  COUNTS:  YES  PLAN OF CARE: Discharge to home after PACU  PATIENT DISPOSITION:  PACU - hemodynamically stable.  INDICATION: 55 y.o. 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     OR FINDINGS: pilonidal disease with fistula tract  DESCRIPTION: the patient was identified in the preoperative holding area and taken to the OR where they were laid on the operating room table.  MAC anesthesia was induced without difficulty. The patient was then positioned in prone jackknife position with buttocks gently taped apart.  The patient was then prepped and draped in usual sterile fashion.  SCDs were noted to be in place prior to the initiation of anesthesia. A surgical timeout was performed indicating the correct patient, procedure, positioning and need for preoperative antibiotics.  A field block was performed using Marcaine with epinephrine.    I began making an incision on the patient's left gluteus lateral to the area of inflammation and scar. Dissection was carried down underneath the pilonidal disease and across the gluteal cleft. I used a metal probe to confirm position of the fistula and abscess cavity. I then separated the cavity from the dermal layer using sharp dissection. I closed one skin hole using a 3-0 Vicryl suture. I then removed the entire pilonidal tract and cavity with continued dissection. I then closed the fistula tract using 20 and 3-0 Vicryl interrupted sutures. After this was completed,  we obtained hemostasis within the cavity. I advanced a small flap on the left side to allow for better closure. I placed a quarter inch Penrose drain into the base of the wound. I then closed the subcutaneous tissues over this drain using 2-0 Vicryl sutures. The dermal layer was then also reapproximated using 2-0 Vicryl sutures. The skin was then closed using 2-0 nylon sutures. The drain was secured with a separate 2-0 nylon suture. The patient tolerated this well. He was awakened from anesthesia and sent to the postanesthesia care unit in stable condition. All counts were correct per operating room staff.

## 2015-10-17 NOTE — Anesthesia Preprocedure Evaluation (Addendum)
Anesthesia Evaluation  Patient identified by MRN, date of birth, ID band Patient awake    Reviewed: Allergy & Precautions, NPO status , Patient's Chart, lab work & pertinent test results  History of Anesthesia Complications Negative for: history of anesthetic complications  Airway Mallampati: I  TM Distance: >3 FB Neck ROM: Full    Dental  (+) Edentulous Upper, Poor Dentition, Missing, Dental Advisory Given   Pulmonary shortness of breath, COPD, Current Smoker, former smoker,  Stage 4 Lung cancer: chemo    + decreased breath sounds      Cardiovascular hypertension (no meds presently), +CHF   Rhythm:Regular Rate:Normal     Neuro/Psych  Headaches, Depression    GI/Hepatic neg GERD  ,(+)     substance abuse (patient and his wife adamant that pt has not used cocaine or Alcohol since his hospitalization 09/30/15)  alcohol use and cocaine use, Lung cancer mets to liver   Endo/Other  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), Type 2, Insulin Dependent  Renal/GU Renal InsufficiencyRenal disease (creat 1.2)     Musculoskeletal  (+) Arthritis ,   Abdominal   Peds  Hematology  (+) Blood dyscrasia (Hb 9.9), anemia ,   Anesthesia Other Findings   Reproductive/Obstetrics                          Anesthesia Physical Anesthesia Plan  ASA: III  Anesthesia Plan: MAC   Post-op Pain Management:    Induction: Intravenous  Airway Management Planned: Natural Airway and Simple Face Mask  Additional Equipment:   Intra-op Plan:   Post-operative Plan:   Informed Consent: I have reviewed the patients History and Physical, chart, labs and discussed the procedure including the risks, benefits and alternatives for the proposed anesthesia with the patient or authorized representative who has indicated his/her understanding and acceptance.   Dental advisory given  Plan Discussed with: CRNA and Surgeon  Anesthesia Plan Comments:  (Plan routine monitors, MAC)        Anesthesia Quick Evaluation

## 2015-10-17 NOTE — Progress Notes (Signed)
Dr. Seward Speck aware of BP 181/105. No new orders at this time.

## 2015-10-17 NOTE — H&P (View-Only) (Signed)
History of Present Illness Keith Ruff MD; 9/56/2130 12:38 PM) Patient words: pil. cyst.  The patient is a 55 year old male who presents with a pilonidal cyst. Keith Holloway is a 55 year old gentleman who comes in as a new patient today to urgent clinic for an 8 month history of recurrent pilonidal abscesses. He stated that these began when he started chemotherapy for stage IV lung cancer. He receives treatments approximately every 3 weeks. Following the treatment he gets an abscess which usually self resolves with antibiotics. He states that this instance has become significant with severe pain. He also has low back pain as a result. He had been placed on Bactrim by his oncologist but unfortunately the area persists. He has placed warm compresses to the area with little resolution. Approximately 3 weeks ago he underwent an I&D of the abscess. It has continued to drain purulence for the past few weeks. Of note is been off of his chemotherapy due to a high potassium level.   Problem List/Past Medical Keith Ruff, MD; 8/65/7846 12:39 PM) PILONIDAL ABSCESS (L05.01)  Other Problems Keith Ruff, MD; 9/62/9528 12:39 PM) Lung Cancer High blood pressure Hypercholesterolemia Diabetes Mellitus  Past Surgical History Keith Ruff, MD; 05/29/2438 12:39 PM) Cataract Surgery Bilateral.  Diagnostic Studies History Keith Ruff, MD; 02/17/7251 12:39 PM) Colonoscopy 1-5 years ago  Allergies Mammie Lorenzo, LPN; 6/64/4034 7:42 AM) No Known Drug Allergies08/04/2015  Medication History Keith Ruff, MD; 5/95/6387 12:39 PM) Aspirin ('81MG'$  Tablet, Oral) Active. Medications Reconciled Hydrocodone-Acetaminophen (5-'325MG'$  Tablet, Oral) Active. Simvastatin ('40MG'$  Tablet, Oral) Active. Losartan Potassium-HCTZ (100-'25MG'$  Tablet, Oral) Active. NovoLIN 70/30 ((70-30) 100UNIT/ML Suspension, Subcutaneous) Active. Vitamin D (Cholecalciferol) (1000UNIT Capsule, Oral)  Active. Vitamin C ('500MG'$  Capsule, Oral) Active. Iron (325 (65 Fe)MG Tablet, Oral) Active.  Social History Keith Ruff, MD; 5/64/3329 12:39 PM) Alcohol use Occasional alcohol use. Tobacco use Current every day smoker. Caffeine use Coffee. Illicit drug use Uses socially only.  Family History Keith Ruff, MD; 07/03/8414 12:39 PM) Alcohol Abuse Brother, Sister. Cancer Brother. Hypertension Sister. Heart Disease Father. Cerebrovascular Accident Brother.    Review of Systems Keith Ruff MD; 07/22/3014 12:39 PM) General Present- Fatigue. Not Present- Appetite Loss, Chills, Fever, Night Sweats, Weight Gain and Weight Loss. Skin Not Present- Change in Wart/Mole, Dryness, Hives, Jaundice, New Lesions, Non-Healing Wounds, Rash and Ulcer. HEENT Present- Visual Disturbances. Not Present- Earache, Hearing Loss, Hoarseness, Nose Bleed, Oral Ulcers, Ringing in the Ears, Seasonal Allergies, Sinus Pain, Sore Throat, Wears glasses/contact lenses and Yellow Eyes. Respiratory Not Present- Bloody sputum, Chronic Cough, Difficulty Breathing, Snoring and Wheezing. Breast Not Present- Breast Mass, Breast Pain, Nipple Discharge and Skin Changes. Cardiovascular Present- Shortness of Breath. Not Present- Chest Pain, Difficulty Breathing Lying Down, Leg Cramps, Palpitations, Rapid Heart Rate and Swelling of Extremities. Gastrointestinal Not Present- Abdominal Pain, Bloating, Bloody Stool, Change in Bowel Habits, Chronic diarrhea, Constipation, Difficulty Swallowing, Excessive gas, Gets full quickly at meals, Hemorrhoids, Indigestion, Nausea, Rectal Pain and Vomiting. Male Genitourinary Not Present- Blood in Urine, Change in Urinary Stream, Frequency, Impotence, Nocturia, Painful Urination, Urgency and Urine Leakage. Musculoskeletal Present- Back Pain. Not Present- Joint Pain, Joint Stiffness, Muscle Pain, Muscle Weakness and Swelling of Extremities. Neurological Not Present- Decreased Memory,  Fainting, Headaches, Numbness, Seizures, Tingling, Tremor, Trouble walking and Weakness. Psychiatric Not Present- Anxiety, Bipolar, Change in Sleep Pattern, Depression, Fearful and Frequent crying. Endocrine Not Present- Cold Intolerance, Excessive Hunger, Hair Changes, Heat Intolerance and New Diabetes. Hematology Present- Persistent Infections. Not Present- Blood Thinners, Easy Bruising, Excessive bleeding, Gland problems  and HIV.  Vitals Claiborne Billings Dockery LPN; 6/38/9373 4:28 AM) 10/07/2015 9:37 AM Weight: 189 lb Height: 71in Body Surface Area: 2.06 m Body Mass Index: 26.36 kg/m  Temp.: 17F(Oral)  Pulse: 64 (Regular)  BP: 128/86 (Sitting, Left Arm, Standard)       Physical Exam Keith Ruff MD; 7/68/1157 12:39 PM) General Mental Status-Alert. General Appearance-Cooperative, Not in acute distress. Orientation-Oriented X4.  Integumentary General Characteristics Overall examination of the patient's skin reveals - no rashes. Color - normal coloration of skin. Skin Moisture - normal skin moisture.  Head and Neck Head-normocephalic, atraumatic with no lesions or palpable masses.  Chest and Lung Exam Chest and lung exam reveals -quiet, even and easy respiratory effort with no use of accessory muscles.  Rectal Note: There is a mildly tender, fluctuant area to the pilonidal crease just to the left of midline. There is a small pilonidal pit at midline   Neurologic Neurologic evaluation reveals -normal attention span and ability to concentrate and able to name objects and repeat phrases. Appropriate fund of knowledge .  Musculoskeletal Global Assessment Gait and Station - normal gait and station and normal posture.    Assessment & Plan Keith Ruff MD; 2/62/0355 10:07 AM) PILONIDAL ABSCESS (L05.01) Impression: 55 year old male who presents to the office for evaluation of a recurrent pilonidal abscess. He underwent I&D of this approximately 2 weeks  ago. This has been recurring for the past 8 months or so. He is on chemotherapy for lung cancer. On exam he has an obvious pilonidal pit with inflammation on the left side of his gluteus. I have recommended excision of this area. I will discuss this with his oncologist prior to scheduling surgery.

## 2015-10-17 NOTE — Transfer of Care (Signed)
Immediate Anesthesia Transfer of Care Note  Patient: Keith Holloway  Procedure(s) Performed: Procedure(s): EXCISION PILONIDAL DISEASE (N/A)  Patient Location: PACU  Anesthesia Type:MAC  Level of Consciousness: awake, alert , oriented and patient cooperative  Airway & Oxygen Therapy: Patient Spontanous Breathing and Patient connected to nasal cannula oxygen  Post-op Assessment: Report given to RN and Post -op Vital signs reviewed and stable  Post vital signs: Reviewed and stable  Last Vitals:  Vitals:   10/17/15 0941  BP: (!) 201/109  Pulse: 87  Resp: 16  Temp: 36.4 C    Last Pain:  Vitals:   10/17/15 0941  TempSrc: Oral      Patients Stated Pain Goal: 9 (75/30/05 1102)  Complications: No apparent anesthesia complications

## 2015-10-17 NOTE — Discharge Instructions (Addendum)
Post Anesthesia Home Care Instructions  Activity: Get plenty of rest for the remainder of the day. A responsible adult should stay with you for 24 hours following the procedure.  For the next 24 hours, DO NOT: -Drive a car -Paediatric nurse -Drink alcoholic beverages -Take any medication unless instructed by your physician -Make any legal decisions or sign important papers.  Meals: Start with liquid foods such as gelatin or soup. Progress to regular foods as tolerated. Avoid greasy, spicy, heavy foods. If nausea and/or vomiting occur, drink only clear liquids until the nausea and/or vomiting subsides. Call your physician if vomiting continues.  Special Instructions/Symptoms: Your throat may feel dry or sore from the anesthesia or the breathing tube placed in your throat during surgery. If this causes discomfort, gargle with warm salt water. The discomfort should disappear within 24 hours.  If you had a scopolamine patch placed behind your ear for the management of post- operative nausea and/or vomiting:  1. The medication in the patch is effective for 72 hours, after which it should be removed.  Wrap patch in a tissue and discard in the trash. Wash hands thoroughly with soap and water. 2. You may remove the patch earlier than 72 hours if you experience unpleasant side effects which may include dry mouth, dizziness or visual disturbances. 3. Avoid touching the patch. Wash your hands with soap and water after contact with the patch.   GENERAL SURGERY: POST OP INSTRUCTIONS  1. DIET: Follow a light bland diet the first 24 hours after arrival home, such as soup, liquids, crackers, etc.  Be sure to include lots of fluids daily.  Avoid fast food or heavy meals as your are more likely to get nauseated.   2. Take your usually prescribed home medications unless otherwise directed. 3. PAIN CONTROL: a. Pain is best controlled by a usual combination of three different methods  TOGETHER: i. Ice/Heat ii. Over the counter pain medication iii. Prescription pain medication b. Most patients will experience some swelling and bruising around the incisions.  Ice packs or heating pads (30-60 minutes up to 6 times a day) will help. Use ice for the first few days to help decrease swelling and bruising, then switch to heat to help relax tight/sore spots and speed recovery.  Some people prefer to use ice alone, heat alone, alternating between ice & heat.  Experiment to what works for you.  Swelling and bruising can take several weeks to resolve.   c. It is helpful to take an over-the-counter pain medication regularly for the first few weeks.  Choose one of the following that works best for you: i. Naproxen (Aleve, etc)  Two '220mg'$  tabs twice a day ii. Ibuprofen (Advil, etc) Three '200mg'$  tabs four times a day (every meal & bedtime) d. A  prescription for pain medication (such as Percocet, oxycodone, hydrocodone, etc) should be given to you upon discharge.  Take your pain medication as prescribed.  i. If you are having problems/concerns with the prescription medicine (does not control pain, nausea, vomiting, rash, itching, etc), please call us [phone] to see if we need to switch you to a different pain medicine that will work better for you and/or control your side effect better. ii. If you need a refill on your pain medication, please contact your pharmacy.  They will contact our office to request authorization. Prescriptions will not be filled after 5 pm or on week-ends. 4. Avoid getting constipated.  Between the surgery and the pain medications, it  is common to experience some constipation.  Increasing fluid intake and taking a fiber supplement (such as Metamucil, Citrucel, FiberCon, MiraLax, etc) 1-2 times a day regularly will usually help prevent this problem from occurring.  A mild laxative (prune juice, Milk of Magnesia, MiraLax, etc) should be taken according to package directions  if there are no bowel movements after 48 hours.   5. Wash / shower every day.  You may shower over the dressings as they are waterproof.  Continue to shower over incision(s) after the dressing is off. 6. Remove your waterproof bandages 5 days after surgery.  You may leave the incision open to air.  You may have skin tapes (Steri Strips) covering the incision(s).  Leave them on until one week, then remove.  You may replace a dressing/Band-Aid to cover the incision for comfort if you wish.      7. ACTIVITIES as tolerated:   a. You may resume regular (light) daily activities beginning the next day--such as daily self-care, walking, climbing stairs--gradually increasing activities as tolerated.  If you can walk 30 minutes without difficulty, it is safe to try more intense activity such as jogging, treadmill, bicycling, low-impact aerobics, swimming, etc. b. Save the most intensive and strenuous activity for last such as sit-ups, heavy lifting, contact sports, etc  Refrain from any heavy lifting or straining until you are off narcotics for pain control.   c. DO NOT PUSH THROUGH PAIN.  Let pain be your guide: If it hurts to do something, don't do it.  Pain is your body warning you to avoid that activity for another week until the pain goes down. d. You may drive when you are no longer taking prescription pain medication, you can comfortably wear a seatbelt, and you can safely maneuver your car and apply brakes. e. Dennis Bast may have sexual intercourse when it is comfortable.  8. FOLLOW UP in our office a. Please call [facility] at [phone] to set up an appointment to see your surgeon in the office for a follow-up appointment approximately 2-3 weeks after your surgery. b. Make sure that you call for this appointment the day you arrive home to insure a convenient appointment time. 9. IF YOU HAVE DISABILITY OR FAMILY LEAVE FORMS, BRING THEM TO THE OFFICE FOR PROCESSING.  DO NOT GIVE THEM TO YOUR DOCTOR.   WHEN TO  CALL us [phone]: 1. Poor pain control 2. Reactions / problems with new medications (rash/itching, nausea, etc)  3. Fever over 101.5 F (38.5 C) 4. Worsening swelling or bruising 5. Continued bleeding from incision. 6. Increased pain, redness, or drainage from the incision   The clinic staff is available to answer your questions during regular business hours (8:30am-5pm).  Please dont hesitate to call and ask to speak to one of our nurses for clinical concerns.   If you have a medical emergency, go to the nearest emergency room or call 911.  A surgeon from [facility] is always on call at the [facility], [place], [place], [place], [place]  [address] ? MAIN: [phone] ? TOLL FREE: [phone] ?  FAX [phone] [email or web address]

## 2015-10-20 NOTE — Anesthesia Postprocedure Evaluation (Signed)
Anesthesia Post Note  Patient: Keith Holloway  Procedure(s) Performed: Procedure(s) (LRB): EXCISION PILONIDAL DISEASE (N/A)  Patient location during evaluation: PACU Anesthesia Type: MAC Level of consciousness: awake and alert Pain management: pain level controlled Vital Signs Assessment: post-procedure vital signs reviewed and stable Respiratory status: spontaneous breathing, nonlabored ventilation, respiratory function stable and patient connected to nasal cannula oxygen Cardiovascular status: stable and blood pressure returned to baseline Anesthetic complications: no    Last Vitals:  Vitals:   10/17/15 1400 10/17/15 1430  BP: (!) 194/109 (!) 196/107  Pulse: 91 95  Resp: 19 17  Temp:      Last Pain:  Vitals:   10/17/15 1430  TempSrc:   PainSc: 0-No pain                 Rennie Rouch EDWARD

## 2015-10-21 ENCOUNTER — Telehealth: Payer: Self-pay | Admitting: Internal Medicine

## 2015-10-21 ENCOUNTER — Encounter (HOSPITAL_BASED_OUTPATIENT_CLINIC_OR_DEPARTMENT_OTHER): Payer: Self-pay | Admitting: General Surgery

## 2015-10-21 ENCOUNTER — Ambulatory Visit: Payer: Medicare Other

## 2015-10-21 ENCOUNTER — Ambulatory Visit (HOSPITAL_BASED_OUTPATIENT_CLINIC_OR_DEPARTMENT_OTHER): Payer: Medicare Other | Admitting: Internal Medicine

## 2015-10-21 ENCOUNTER — Other Ambulatory Visit: Payer: Medicare Other

## 2015-10-21 ENCOUNTER — Other Ambulatory Visit (HOSPITAL_BASED_OUTPATIENT_CLINIC_OR_DEPARTMENT_OTHER): Payer: Medicare Other

## 2015-10-21 VITALS — BP 207/98 | HR 79 | Temp 98.1°F | Resp 18 | Ht 71.0 in | Wt 194.5 lb

## 2015-10-21 DIAGNOSIS — I10 Essential (primary) hypertension: Secondary | ICD-10-CM

## 2015-10-21 DIAGNOSIS — N182 Chronic kidney disease, stage 2 (mild): Secondary | ICD-10-CM

## 2015-10-21 DIAGNOSIS — C3492 Malignant neoplasm of unspecified part of left bronchus or lung: Secondary | ICD-10-CM

## 2015-10-21 DIAGNOSIS — G4700 Insomnia, unspecified: Secondary | ICD-10-CM

## 2015-10-21 DIAGNOSIS — Z5111 Encounter for antineoplastic chemotherapy: Secondary | ICD-10-CM

## 2015-10-21 DIAGNOSIS — E875 Hyperkalemia: Secondary | ICD-10-CM

## 2015-10-21 DIAGNOSIS — C3432 Malignant neoplasm of lower lobe, left bronchus or lung: Secondary | ICD-10-CM

## 2015-10-21 LAB — CBC WITH DIFFERENTIAL/PLATELET
BASO%: 0.4 % (ref 0.0–2.0)
Basophils Absolute: 0 10*3/uL (ref 0.0–0.1)
EOS ABS: 0 10*3/uL (ref 0.0–0.5)
EOS%: 0 % (ref 0.0–7.0)
HEMATOCRIT: 32.3 % — AB (ref 38.4–49.9)
HEMOGLOBIN: 10.2 g/dL — AB (ref 13.0–17.1)
LYMPH%: 9.7 % — ABNORMAL LOW (ref 14.0–49.0)
MCH: 23.4 pg — ABNORMAL LOW (ref 27.2–33.4)
MCHC: 31.7 g/dL — ABNORMAL LOW (ref 32.0–36.0)
MCV: 73.7 fL — AB (ref 79.3–98.0)
MONO#: 0.5 10*3/uL (ref 0.1–0.9)
MONO%: 9.6 % (ref 0.0–14.0)
NEUT%: 80.3 % — ABNORMAL HIGH (ref 39.0–75.0)
NEUTROS ABS: 4.4 10*3/uL (ref 1.5–6.5)
PLATELETS: 196 10*3/uL (ref 140–400)
RBC: 4.38 10*6/uL (ref 4.20–5.82)
RDW: 17.2 % — AB (ref 11.0–14.6)
WBC: 5.5 10*3/uL (ref 4.0–10.3)
lymph#: 0.5 10*3/uL — ABNORMAL LOW (ref 0.9–3.3)

## 2015-10-21 LAB — COMPREHENSIVE METABOLIC PANEL
ALBUMIN: 3.2 g/dL — AB (ref 3.5–5.0)
ALK PHOS: 94 U/L (ref 40–150)
ALT: 15 U/L (ref 0–55)
ANION GAP: 10 meq/L (ref 3–11)
AST: 15 U/L (ref 5–34)
BILIRUBIN TOTAL: 0.3 mg/dL (ref 0.20–1.20)
BUN: 22.7 mg/dL (ref 7.0–26.0)
CO2: 18 mEq/L — ABNORMAL LOW (ref 22–29)
Calcium: 8.7 mg/dL (ref 8.4–10.4)
Chloride: 110 mEq/L — ABNORMAL HIGH (ref 98–109)
Creatinine: 1.4 mg/dL — ABNORMAL HIGH (ref 0.7–1.3)
EGFR: 64 mL/min/{1.73_m2} — AB (ref >90)
GLUCOSE: 327 mg/dL — AB (ref 70–140)
Potassium: 5 mEq/L (ref 3.5–5.1)
Sodium: 137 mEq/L (ref 136–145)
TOTAL PROTEIN: 6.5 g/dL (ref 6.4–8.3)

## 2015-10-21 LAB — POCT I-STAT, CHEM 8
BUN: 15 mg/dL (ref 6–20)
CHLORIDE: 109 mmol/L (ref 101–111)
Calcium, Ion: 1.27 mmol/L (ref 1.15–1.40)
Creatinine, Ser: 1.2 mg/dL (ref 0.61–1.24)
Glucose, Bld: 95 mg/dL (ref 65–99)
HEMATOCRIT: 31 % — AB (ref 39.0–52.0)
Hemoglobin: 10.5 g/dL — ABNORMAL LOW (ref 13.0–17.0)
Potassium: 4.3 mmol/L (ref 3.5–5.1)
SODIUM: 145 mmol/L (ref 135–145)
TCO2: 23 mmol/L (ref 0–100)

## 2015-10-21 MED ORDER — CLONIDINE HCL 0.1 MG PO TABS
0.3000 mg | ORAL_TABLET | Freq: Once | ORAL | Status: AC
  Administered 2015-10-21: 0.3 mg via ORAL

## 2015-10-21 MED ORDER — CLONIDINE HCL 0.1 MG PO TABS
ORAL_TABLET | ORAL | Status: AC
  Filled 2015-10-21: qty 3

## 2015-10-21 NOTE — Progress Notes (Signed)
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  PRIOR THERAPY:  1) Systemic chemotherapy with carboplatin for AUC of 5 given on day 1 and gemcitabine 1000 MG/M2 on days 1 and 8 every 3 weeks. Status post 6 cycles with partial response. 2) Nivolumab 240 MG IV every 2 weeks status post 4 cycles, last dose was given 03/18/2015 discontinued today secondary to disease progression.  CURRENT THERAPY: Systemic chemotherapy with docetaxel 75 MG/M2 and Cyramza 10 MG/KG every 3 weeks, first dose 04/08/2015. Status post 9 cycles.  CODE STATUS: Full code initially but no prolonged resuscitation.   INTERVAL HISTORY: Keith Holloway 55 y.o. male returns to the clinic today for follow-up visit accompanied by his wife. The patient is feeling fine today with no specific complaints. He underwent excision of pilonidal cyst under the care of Dr. Marcello Moores on 10/17/2015. He is recovering well from the surgery. He tolerated the last cycle of his systemic chemotherapy with docetaxel and Cyramza fairly well. He has been off treatment for the last few weeks because of his significant hyperkalemia and the excision of the pilonidal cyst. He denied having any significant chest pain, shortness of breath or hemoptysis. The patient denied having any fever or chills. He has no nausea or vomiting. He had repeat CT scan of the chest, abdomen and pelvis performed recently and he is here for evaluation before resuming his systemic chemotherapy. His blood pressure is uncontrolled after his primary care physician discontinued losartan secondary to hyperkalemia.  MEDICAL HISTORY: Past Medical History:  Diagnosis Date  . Alcohol abuse   . Arthritis   . Borderline hypertension   . CHF  (congestive heart failure) ([place])   . CKD (chronic kidney disease), stage III   . Cocaine abuse    last [employer] post[employer] for cocaine 09-30-2015  . Depression   . Dyspnea on effort   . History of acute renal failure    09-30-2015  . History of hyperkalemia    admission 09-30-2015 potassiom 8.0  with medications currently 10-14-2015 down to 4.7 normal  . History of traumatic head injury    1987  w/ LOC   --- no residual  . Hyperlipidemia   . 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 Memory disorder 08/22/2013   alcohol abuse  . Migraine   . Nocturia   . Non-small cell carcinoma of lung, stage 4 [facility]) oncologist-  dr Julien Nordmann   w/ METs to liver dx 04/ 2016--  Stage IV (T2a, N3, M1b), squamous cell carcinoma of left lower lobe---  systemic chemotherapy every 3 weeks and currently due to evidence for disease progression has started immunotherapy  . Pilonidal abscess    recurrent  . Pleural effusion, bilateral    per ct 10-09-2015  new small right effusion and left small to moderate effusion , increased  . Polyneuropathy in diabetes(357.2)   . Port-a-cath in place   . Pulmonary nodules/lesions, multiple    pt has stage 4 lung cancer  . Tobacco abuse counseling 10/09/2014  . Type 2 diabetes mellitus with insulin therapy ([place])   . Wears dentures    upper    ALLERGIES:  has No Known Allergies.  MEDICATIONS:  Current Outpatient  Prescriptions  Medication Sig Dispense Refill  . acetaminophen (TYLENOL) 325 MG tablet Take 2 tablets (650 mg total) by mouth every 6 (six) hours as needed for mild pain (or Fever >/= 101). 30 tablet 0  . aspirin EC 81 MG tablet Take 1 tablet by mouth every morning.     Marland Kitchen dexamethasone (DECADRON) 4 MG tablet 2 TABLETS BY MOUTH TWICE A DAY THE DAY BEFORE, DAY OF AND DAY AFTER THE CHEMOTHERAPY EVERY 3 WEEKS 40 tablet 1  . Ferrous Sulfate (IRON) 325 (65 Fe) MG TABS Take 1 tablet by mouth every morning.     Marland Kitchen  HYDROcodone-acetaminophen (NORCO/VICODIN) 5-325 MG tablet Take 2 tablets by mouth every 6 (six) hours as needed. 30 tablet 0  . insulin NPH-regular Human (NOVOLIN 70/30) (70-30) 100 UNIT/ML injection Inject 35 Units into the skin 2 (two) times daily with a meal. (Patient taking differently: Inject 15 Units into the skin 2 (two) times daily with a meal. ONLY IF CBG IS OVER 200.) 10 mL 11  . lidocaine-prilocaine (EMLA) cream Apply 1 application topically as needed. 30 g 0  . nicotine (NICODERM CQ - DOSED IN MG/24 HOURS) 14 mg/24hr patch Place 14 mg onto the skin daily.    . ondansetron (ZOFRAN) 8 MG tablet TAKE 1 TABLET EVERY 8 HOURS AS NEEDED FOR NAUSEA AND VOMITING 20 tablet 1  . prochlorperazine (COMPAZINE) 10 MG tablet TAKE 1 TABLET (10 MG TOTAL) BY MOUTH EVERY 6 (SIX) HOURS AS NEEDED FOR NAUSEA OR VOMITING. 30 tablet 0  . simvastatin (ZOCOR) 40 MG tablet Take 1 tablet (40 mg total) by mouth daily. (Patient taking differently: Take 40 mg by mouth every morning. ) 30 tablet 3  . temazepam (RESTORIL) 30 MG capsule TAKE ONE CAPSULE AT BEDTIME (Patient taking differently: TAKE ONE CAPSULE AT BEDTIME--  PRN) 30 capsule 0  . vitamin C (ASCORBIC ACID) 500 MG tablet Take 500 mg by mouth daily.     No current facility-administered medications for this visit.     SURGICAL HISTORY:  Past Surgical History:  Procedure Laterality Date  . CATARACT EXTRACTION, BILATERAL  08/2012  . PILONIDAL CYST EXCISION N/A 10/17/2015   Procedure: EXCISION PILONIDAL DISEASE;  Surgeon: Leighton Ruff, MD;  Location: [facility];  Service: General;  Laterality: N/A;  . PORTACATH PLACEMENT  01/30/2015  . RETINAL DETACHMENT SURGERY Bilateral 04/2012    REVIEW OF SYSTEMS:  Constitutional: negative Eyes: negative Ears, nose, mouth, throat, and face: negative Respiratory: positive for dyspnea on exertion Cardiovascular: negative Gastrointestinal: negative Genitourinary:negative Integument/breast:  negative Hematologic/lymphatic: negative Musculoskeletal:negative Neurological: negative Behavioral/Psych: negative Endocrine: negative Allergic/Immunologic: negative   PHYSICAL EXAMINATION: General appearance: alert, cooperative, fatigued and no distress Head: Normocephalic, without obvious abnormality, atraumatic Neck: no adenopathy, no JVD, supple, symmetrical, trachea midline and thyroid not enlarged, symmetric, no tenderness/mass/nodules Lymph nodes: Cervical, supraclavicular, and axillary nodes normal. Resp: clear to auscultation bilaterally Back: symmetric, no curvature. ROM normal. No CVA tenderness. Cardio: regular rate and rhythm, S1, S2 normal, no murmur, click, rub or gallop GI: soft, non-tender; bowel sounds normal; no masses,  no organomegaly Extremities: extremities normal, atraumatic, no cyanosis or edema Neurologic: Alert and oriented X 3, normal strength and tone. Normal symmetric reflexes. Normal coordination and gait  ECOG PERFORMANCE STATUS: 1 - Symptomatic but completely ambulatory  Blood pressure (!) 207/98, pulse 79, temperature 98.1 F (36.7 C), temperature source Oral, resp. rate 18, height '5\' 11"'$  (1.803 m), weight 194 lb 8 oz (88.2 kg).  LABORATORY DATA: Lab Results  Component Value Date   WBC 5.5 10/21/2015   HGB 10.2 (L) 10/21/2015   HCT 32.3 (L) 10/21/2015   MCV 73.7 (L) 10/21/2015   PLT 196 10/21/2015      Chemistry      Component Value Date/Time   NA 145 10/14/2015 1029   K 4.7 10/14/2015 1029   CL 116 (H) 10/02/2015 0425   CO2 24 10/14/2015 1029   BUN 12.8 10/14/2015 1029   CREATININE 1.2 10/14/2015 1029      Component Value Date/Time   CALCIUM 9.2 10/14/2015 1029   ALKPHOS 82 10/14/2015 1029   AST 20 10/14/2015 1029   ALT 14 10/14/2015 1029   BILITOT 0.44 10/14/2015 1029       RADIOGRAPHIC STUDIES: Ct Abdomen Pelvis Wo Contrast  Result Date: 10/09/2015 CLINICAL DATA:  Re- stage stage IV non-small-cell left lung carcinoma  diagnosed in March 2016 with ongoing chemotherapy. EXAM: CT CHEST, ABDOMEN AND PELVIS WITHOUT CONTRAST TECHNIQUE: Multidetector CT imaging of the chest, abdomen and pelvis was performed following the standard protocol without IV contrast. COMPARISON:  08/07/2015 CT chest, abdomen and pelvis. FINDINGS: CT CHEST FINDINGS Mediastinum/Nodes: Normal heart size. Stable small pericardial effusion/ thickening measuring up to 9 mm thickness anteriorly. Right internal jugular MediPort terminates in the lower third of the superior vena cava. Great vessels are normal in course and caliber. No discrete thyroid nodules. Unremarkable esophagus. No axillary adenopathy. Conglomerate anterior mediastinal adenopathy measures 6.0 x 3.0 cm (series 2/ image 25), previously 6.2 x 3.3 cm using similar measurement technique, slightly decreased. No additional pathologically enlarged mediastinal or hilar nodes on this noncontrast study. Lungs/Pleura: No pneumothorax. Small layering right pleural effusion is new. Mild compressive atelectasis in the dependent right lower lobe. Small to moderate layering left pleural effusion is increased. Moderate to marked left lower lobe compressive atelectasis. Central left lower lobe pulmonary nodule measures approximately 2.0 x 1.7 cm (series 2/ image 35), previously 2.5 x 1.9 cm, decreased. Numerous additional pulmonary nodules in both lungs are stable to decreased, for example in the anterior right upper lobe 2 mm solid pulmonary nodule (series 4/ image 61), previously 4 mm, decreased. Central left upper lobe 1.2 cm spiculated nodule (series 4/ image 49), previously 1.3 cm, not appreciably changed. No new significant pulmonary nodules. Musculoskeletal: Stable patchy sclerosis in the sternum and manubrium. No new focal osseous lesions in the chest. Mild thoracic spondylosis. CT ABDOMEN PELVIS FINDINGS Hepatobiliary: Normal size liver. Low-attenuation 1.5 x 0.6 cm superior right liver lobe lesion (series  2/ image 48), previously 1.7 x 0.6 cm, slightly decreased. Far inferior right liver lobe 0.6 x 0.6 cm low-attenuation lesion (series 2/ image 64), previously 0.7 x 0.7 cm, slightly decreased. No new liver lesions. Normal gallbladder with no radiopaque cholelithiasis. No biliary ductal dilatation. Pancreas: Normal, with no mass or duct dilation. Spleen: Normal size. No mass. Adrenals/Urinary Tract: Normal adrenals. No hydronephrosis. No renal stones. No contour deforming renal mass. Collapsed and grossly normal bladder. Stomach/Bowel: Grossly normal stomach. Normal caliber small bowel with no small bowel wall thickening. Appendix not discretely visualized. No definite large bowel wall thickening. Moderate colonic stool volume. Vascular/Lymphatic: Atherosclerotic nonaneurysmal abdominal aorta. No pathologically enlarged lymph nodes in the abdomen or pelvis. Reproductive: Stable top-normal size prostate. Other: No pneumoperitoneum. Mild fat stranding throughout the peritoneal cavity without ascites or focal fluid collection. Mild anasarca. Musculoskeletal: No aggressive appearing focal osseous lesions. Mild lumbar spondylosis. IMPRESSION: 1. No new or progressive metastatic disease. 2. Central left lower lobe pulmonary nodule  is mildly decreased. Numerous additional smaller pulmonary nodules are stable to decreased. 3. Confluent anterior mediastinal adenopathy is slightly decreased. 4. Small liver lesions are slightly decreased. 5. Stable sclerotic sternomanubrial bone lesions. 6. Small right pleural effusion is new. Small to moderate left pleural effusion is increased. Mild anasarca. 7. Aortic atherosclerosis. Electronically Signed   By: Ilona Sorrel M.D.   On: 10/09/2015 16:24   Ct Chest Wo Contrast  Result Date: 10/09/2015 CLINICAL DATA:  Re- stage stage IV non-small-cell left lung carcinoma diagnosed in March 2016 with ongoing chemotherapy. EXAM: CT CHEST, ABDOMEN AND PELVIS WITHOUT CONTRAST TECHNIQUE:  Multidetector CT imaging of the chest, abdomen and pelvis was performed following the standard protocol without IV contrast. COMPARISON:  08/07/2015 CT chest, abdomen and pelvis. FINDINGS: CT CHEST FINDINGS Mediastinum/Nodes: Normal heart size. Stable small pericardial effusion/ thickening measuring up to 9 mm thickness anteriorly. Right internal jugular MediPort terminates in the lower third of the superior vena cava. Great vessels are normal in course and caliber. No discrete thyroid nodules. Unremarkable esophagus. No axillary adenopathy. Conglomerate anterior mediastinal adenopathy measures 6.0 x 3.0 cm (series 2/ image 25), previously 6.2 x 3.3 cm using similar measurement technique, slightly decreased. No additional pathologically enlarged mediastinal or hilar nodes on this noncontrast study. Lungs/Pleura: No pneumothorax. Small layering right pleural effusion is new. Mild compressive atelectasis in the dependent right lower lobe. Small to moderate layering left pleural effusion is increased. Moderate to marked left lower lobe compressive atelectasis. Central left lower lobe pulmonary nodule measures approximately 2.0 x 1.7 cm (series 2/ image 35), previously 2.5 x 1.9 cm, decreased. Numerous additional pulmonary nodules in both lungs are stable to decreased, for example in the anterior right upper lobe 2 mm solid pulmonary nodule (series 4/ image 61), previously 4 mm, decreased. Central left upper lobe 1.2 cm spiculated nodule (series 4/ image 49), previously 1.3 cm, not appreciably changed. No new significant pulmonary nodules. Musculoskeletal: Stable patchy sclerosis in the sternum and manubrium. No new focal osseous lesions in the chest. Mild thoracic spondylosis. CT ABDOMEN PELVIS FINDINGS Hepatobiliary: Normal size liver. Low-attenuation 1.5 x 0.6 cm superior right liver lobe lesion (series 2/ image 48), previously 1.7 x 0.6 cm, slightly decreased. Far inferior right liver lobe 0.6 x 0.6 cm  low-attenuation lesion (series 2/ image 64), previously 0.7 x 0.7 cm, slightly decreased. No new liver lesions. Normal gallbladder with no radiopaque cholelithiasis. No biliary ductal dilatation. Pancreas: Normal, with no mass or duct dilation. Spleen: Normal size. No mass. Adrenals/Urinary Tract: Normal adrenals. No hydronephrosis. No renal stones. No contour deforming renal mass. Collapsed and grossly normal bladder. Stomach/Bowel: Grossly normal stomach. Normal caliber small bowel with no small bowel wall thickening. Appendix not discretely visualized. No definite large bowel wall thickening. Moderate colonic stool volume. Vascular/Lymphatic: Atherosclerotic nonaneurysmal abdominal aorta. No pathologically enlarged lymph nodes in the abdomen or pelvis. Reproductive: Stable top-normal size prostate. Other: No pneumoperitoneum. Mild fat stranding throughout the peritoneal cavity without ascites or focal fluid collection. Mild anasarca. Musculoskeletal: No aggressive appearing focal osseous lesions. Mild lumbar spondylosis. IMPRESSION: 1. No new or progressive metastatic disease. 2. Central left lower lobe pulmonary nodule is mildly decreased. Numerous additional smaller pulmonary nodules are stable to decreased. 3. Confluent anterior mediastinal adenopathy is slightly decreased. 4. Small liver lesions are slightly decreased. 5. Stable sclerotic sternomanubrial bone lesions. 6. Small right pleural effusion is new. Small to moderate left pleural effusion is increased. Mild anasarca. 7. Aortic atherosclerosis. Electronically Signed   By: Rinaldo Ratel  Poff M.D.   On: 10/09/2015 16:24    ASSESSMENT AND PLAN: This is a very pleasant 55 years old African-American male with a stage IV non-small cell lung cancer, squamous cell carcinoma who completed systemic chemotherapy with carboplatin and gemcitabine status post 6 cycles. The patient has tolerated his treatment fairly well with no significant adverse effects except for  mild fatigue and nausea. He had evidence for disease progression and the patient was started on treatment with immunotherapy with Nivolumab currently status post 4 cycles. Unfortunately the recent CT scan of the chest, abdomen and pelvis showed interval progression of his disease with increase in the size of the dominant anterior mediastinal mass and left lower lobe perihilar mass. There was also development of new subcarinal adenopathy and multiple new pulmonary nodules as well as questionable liver lesion. He is currently undergoing systemic chemotherapy with docetaxel and Cyramza status post 8 cycles. He is tolerating his treatment fairly well. The recent CT scan of the chest, abdomen and pelvis showed no evidence for disease progression with further improvement of the pulmonary nodules and liver lesions. I discussed the scan results with the patient and his wife. I recommended for him to continue his treatment with docetaxel and Cyramza that I will delay the start of cycle #9 by 1 week to give him more time for the healing after the recent surgery. For the hypertension, I will give the patient a dose of clonidine 0.3 mg by mouth 1 and he was advised to call Dr. Nancy Fetter today to resume some form of treatment for his hypertension. He was also advised to go to the emergency department immediately if there is not improvement in his blood pressure. For insomnia, he will continue on Restoril 30 mg by mouth daily at bedtime when necessary. He would come back for follow-up visit in 4 weeks for reevaluation before starting cycle #10. For the hyperkalemia, his potassium level is still pending for today. We will call the patient with recommendation depending on his potassium level today.  He was advised to call immediately if he has any concerning symptoms in the interval. The patient voices understanding of current disease status and treatment options and is in agreement with the current care plan.  All  questions were answered. The patient knows to call the clinic with any problems, questions or concerns. We can certainly see the patient much sooner if necessary.  Disclaimer: This note was dictated with voice recognition software. Similar sounding words can inadvertently be transcribed and may not be corrected upon review.

## 2015-10-21 NOTE — Telephone Encounter (Signed)
Gave relative avs report and appointments for September and October

## 2015-10-28 ENCOUNTER — Ambulatory Visit (HOSPITAL_BASED_OUTPATIENT_CLINIC_OR_DEPARTMENT_OTHER): Payer: Medicare Other

## 2015-10-28 ENCOUNTER — Other Ambulatory Visit: Payer: Medicare Other

## 2015-10-28 ENCOUNTER — Other Ambulatory Visit (HOSPITAL_BASED_OUTPATIENT_CLINIC_OR_DEPARTMENT_OTHER): Payer: Medicare Other

## 2015-10-28 ENCOUNTER — Ambulatory Visit: Payer: Medicare Other

## 2015-10-28 VITALS — BP 142/87 | HR 60 | Temp 98.2°F | Resp 18

## 2015-10-28 DIAGNOSIS — E1139 Type 2 diabetes mellitus with other diabetic ophthalmic complication: Secondary | ICD-10-CM

## 2015-10-28 DIAGNOSIS — E860 Dehydration: Secondary | ICD-10-CM

## 2015-10-28 DIAGNOSIS — C3432 Malignant neoplasm of lower lobe, left bronchus or lung: Secondary | ICD-10-CM

## 2015-10-28 DIAGNOSIS — Z794 Long term (current) use of insulin: Secondary | ICD-10-CM

## 2015-10-28 DIAGNOSIS — C3492 Malignant neoplasm of unspecified part of left bronchus or lung: Secondary | ICD-10-CM

## 2015-10-28 DIAGNOSIS — E119 Type 2 diabetes mellitus without complications: Secondary | ICD-10-CM

## 2015-10-28 DIAGNOSIS — N182 Chronic kidney disease, stage 2 (mild): Secondary | ICD-10-CM

## 2015-10-28 DIAGNOSIS — Z5111 Encounter for antineoplastic chemotherapy: Secondary | ICD-10-CM

## 2015-10-28 DIAGNOSIS — Z5112 Encounter for antineoplastic immunotherapy: Secondary | ICD-10-CM

## 2015-10-28 LAB — COMPREHENSIVE METABOLIC PANEL
ALBUMIN: 3.2 g/dL — AB (ref 3.5–5.0)
ALK PHOS: 104 U/L (ref 40–150)
ALT: 20 U/L (ref 0–55)
AST: 14 U/L (ref 5–34)
Anion Gap: 9 mEq/L (ref 3–11)
BUN: 27.9 mg/dL — AB (ref 7.0–26.0)
CALCIUM: 9.2 mg/dL (ref 8.4–10.4)
CO2: 23 mEq/L (ref 22–29)
Chloride: 104 mEq/L (ref 98–109)
Creatinine: 1.6 mg/dL — ABNORMAL HIGH (ref 0.7–1.3)
EGFR: 55 mL/min/{1.73_m2} — AB (ref >90)
Glucose: 428 mg/dl — ABNORMAL HIGH (ref 70–140)
POTASSIUM: 5.1 meq/L (ref 3.5–5.1)
Sodium: 136 mEq/L (ref 136–145)
Total Bilirubin: 0.4 mg/dL (ref 0.20–1.20)
Total Protein: 6.8 g/dL (ref 6.4–8.3)

## 2015-10-28 LAB — CBC WITH DIFFERENTIAL/PLATELET
BASO%: 0.5 % (ref 0.0–2.0)
BASOS ABS: 0 10*3/uL (ref 0.0–0.1)
EOS ABS: 0 10*3/uL (ref 0.0–0.5)
EOS%: 0.1 % (ref 0.0–7.0)
HEMATOCRIT: 35.5 % — AB (ref 38.4–49.9)
HEMOGLOBIN: 11.2 g/dL — AB (ref 13.0–17.1)
LYMPH#: 0.5 10*3/uL — AB (ref 0.9–3.3)
LYMPH%: 9.2 % — ABNORMAL LOW (ref 14.0–49.0)
MCH: 23.1 pg — AB (ref 27.2–33.4)
MCHC: 31.6 g/dL — ABNORMAL LOW (ref 32.0–36.0)
MCV: 73.1 fL — AB (ref 79.3–98.0)
MONO#: 0.5 10*3/uL (ref 0.1–0.9)
MONO%: 8.8 % (ref 0.0–14.0)
NEUT#: 4.2 10*3/uL (ref 1.5–6.5)
NEUT%: 81.4 % — ABNORMAL HIGH (ref 39.0–75.0)
Platelets: 246 10*3/uL (ref 140–400)
RBC: 4.86 10*6/uL (ref 4.20–5.82)
RDW: 16.1 % — AB (ref 11.0–14.6)
WBC: 5.2 10*3/uL (ref 4.0–10.3)

## 2015-10-28 LAB — WHOLE BLOOD GLUCOSE
GLUCOSE: 403 mg/dL — AB (ref 70–100)
Glucose: 396 mg/dL — ABNORMAL HIGH (ref 70–100)
HRS PC: 3 Hours
HRS PC: 4 Hours

## 2015-10-28 MED ORDER — HEPARIN SOD (PORK) LOCK FLUSH 100 UNIT/ML IV SOLN
500.0000 [IU] | Freq: Once | INTRAVENOUS | Status: AC | PRN
  Administered 2015-10-28: 500 [IU]
  Filled 2015-10-28: qty 5

## 2015-10-28 MED ORDER — INSULIN REGULAR HUMAN 100 UNIT/ML IJ SOLN
15.0000 [IU] | Freq: Once | INTRAMUSCULAR | Status: AC
  Administered 2015-10-28: 15 [IU] via SUBCUTANEOUS
  Filled 2015-10-28: qty 0.15

## 2015-10-28 MED ORDER — DIPHENHYDRAMINE HCL 50 MG/ML IJ SOLN
50.0000 mg | Freq: Once | INTRAMUSCULAR | Status: AC
  Administered 2015-10-28: 50 mg via INTRAVENOUS

## 2015-10-28 MED ORDER — SODIUM CHLORIDE 0.9 % IV SOLN
10.0000 mg/kg | Freq: Once | INTRAVENOUS | Status: AC
  Administered 2015-10-28: 800 mg via INTRAVENOUS
  Filled 2015-10-28: qty 50

## 2015-10-28 MED ORDER — SODIUM CHLORIDE 0.9 % IV SOLN
Freq: Once | INTRAVENOUS | Status: AC
  Administered 2015-10-28: 14:00:00 via INTRAVENOUS

## 2015-10-28 MED ORDER — SODIUM CHLORIDE 0.9 % IV SOLN
75.0000 mg/m2 | Freq: Once | INTRAVENOUS | Status: AC
  Administered 2015-10-28: 150 mg via INTRAVENOUS
  Filled 2015-10-28: qty 15

## 2015-10-28 MED ORDER — CLONIDINE HCL 0.2 MG PO TABS
0.3000 mg | ORAL_TABLET | Freq: Once | ORAL | Status: AC
  Administered 2015-10-28: 0.3 mg via ORAL
  Filled 2015-10-28: qty 1.5

## 2015-10-28 MED ORDER — DIPHENHYDRAMINE HCL 50 MG/ML IJ SOLN
INTRAMUSCULAR | Status: AC
  Filled 2015-10-28: qty 1

## 2015-10-28 MED ORDER — DIPHENHYDRAMINE HCL 25 MG PO CAPS
ORAL_CAPSULE | ORAL | Status: AC
  Filled 2015-10-28: qty 1

## 2015-10-28 MED ORDER — ACETAMINOPHEN 325 MG PO TABS
650.0000 mg | ORAL_TABLET | Freq: Once | ORAL | Status: AC
  Administered 2015-10-28: 650 mg via ORAL

## 2015-10-28 MED ORDER — SODIUM CHLORIDE 0.9% FLUSH
10.0000 mL | INTRAVENOUS | Status: DC | PRN
  Administered 2015-10-28: 10 mL
  Filled 2015-10-28: qty 10

## 2015-10-28 MED ORDER — SODIUM CHLORIDE 0.9 % IV SOLN
10.0000 mg | Freq: Once | INTRAVENOUS | Status: AC
  Administered 2015-10-28: 10 mg via INTRAVENOUS
  Filled 2015-10-28: qty 1

## 2015-10-28 MED ORDER — ACETAMINOPHEN 325 MG PO TABS
ORAL_TABLET | ORAL | Status: AC
  Filled 2015-10-28: qty 2

## 2015-10-28 NOTE — Patient Instructions (Signed)
[facility] Discharge Instructions for Patients Receiving Chemotherapy  Today you received the following chemotherapy agents:  Taxotere and Cyramza  To help prevent nausea and vomiting after your treatment, we encourage you to take your nausea medication as ordered per MD.   If you develop nausea and vomiting that is not controlled by your nausea medication, call the clinic.   BELOW ARE SYMPTOMS THAT SHOULD BE REPORTED IMMEDIATELY:  *FEVER GREATER THAN 100.5 F  *CHILLS WITH OR WITHOUT FEVER  NAUSEA AND VOMITING THAT IS NOT CONTROLLED WITH YOUR NAUSEA MEDICATION  *UNUSUAL SHORTNESS OF BREATH  *UNUSUAL BRUISING OR BLEEDING  TENDERNESS IN MOUTH AND THROAT WITH OR WITHOUT PRESENCE OF ULCERS  *URINARY PROBLEMS  *BOWEL PROBLEMS  UNUSUAL RASH Items with * indicate a potential emergency and should be followed up as soon as possible.  Feel free to call the clinic you have any questions or concerns. The clinic phone number is [phone].  Please show the [facility] at check-in to the Emergency Department and triage nurse.

## 2015-10-28 NOTE — Progress Notes (Signed)
Dr. Julien Nordmann notified of BP-183/99 and glucose 428.  Order received to give pt Clonidine 0.3 mg PO now and 15 units of regular insulin now.   1155-Repeat BP-172/105 and CBG-403.  Dr. Julien Nordmann notified and order received to wait additional 30 minutes and obtain BP and CBG at that time. 1245-BP-142/87 [id number], Dr. Julien Nordmann notified.  OK to treat with Taxotere and Cyramza today.

## 2015-10-30 ENCOUNTER — Ambulatory Visit (HOSPITAL_BASED_OUTPATIENT_CLINIC_OR_DEPARTMENT_OTHER): Payer: Medicare Other

## 2015-10-30 VITALS — BP 155/77 | HR 85 | Temp 98.3°F | Resp 20

## 2015-10-30 DIAGNOSIS — C3432 Malignant neoplasm of lower lobe, left bronchus or lung: Secondary | ICD-10-CM | POA: Diagnosis present

## 2015-10-30 DIAGNOSIS — C3492 Malignant neoplasm of unspecified part of left bronchus or lung: Secondary | ICD-10-CM

## 2015-10-30 MED ORDER — PEGFILGRASTIM INJECTION 6 MG/0.6ML ~~LOC~~
6.0000 mg | PREFILLED_SYRINGE | Freq: Once | SUBCUTANEOUS | Status: AC
  Administered 2015-10-30: 6 mg via SUBCUTANEOUS
  Filled 2015-10-30: qty 0.6

## 2015-10-30 NOTE — Patient Instructions (Signed)
Pegfilgrastim injection What is this medicine? PEGFILGRASTIM (PEG fil gra stim) is a long-acting granulocyte colony-stimulating factor that stimulates the growth of neutrophils, a type of white blood cell important in the body's fight against infection. It is used to reduce the incidence of fever and infection in patients with certain types of cancer who are receiving chemotherapy that affects the bone marrow, and to increase survival after being exposed to high doses of radiation. This medicine may be used for other purposes; ask your health care provider or pharmacist if you have questions. What should I tell my health care provider before I take this medicine? They need to know if you have any of these conditions: -kidney disease -latex allergy -ongoing radiation therapy -sickle cell disease -skin reactions to acrylic adhesives (On-Body Injector only) -an unusual or allergic reaction to pegfilgrastim, filgrastim, other medicines, foods, dyes, or preservatives -pregnant or trying to get pregnant -breast-feeding How should I use this medicine? This medicine is for injection under the skin. If you get this medicine at home, you will be taught how to prepare and give the pre-filled syringe or how to use the On-body Injector. Refer to the patient Instructions for Use for detailed instructions. Use exactly as directed. Take your medicine at regular intervals. Do not take your medicine more often than directed. It is important that you put your used needles and syringes in a special sharps container. Do not put them in a trash can. If you do not have a sharps container, call your pharmacist or healthcare provider to get one. Talk to your pediatrician regarding the use of this medicine in children. While this drug may be prescribed for selected conditions, precautions do apply. Overdosage: If you think you have taken too much of this medicine contact a poison control center or emergency room at  once. NOTE: This medicine is only for you. Do not share this medicine with others. What if I miss a dose? It is important not to miss your dose. Call your doctor or health care professional if you miss your dose. If you miss a dose due to an On-body Injector failure or leakage, a new dose should be administered as soon as possible using a single prefilled syringe for manual use. What may interact with this medicine? Interactions have not been studied. Give your health care provider a list of all the medicines, herbs, non-prescription drugs, or dietary supplements you use. Also tell them if you smoke, drink alcohol, or use illegal drugs. Some items may interact with your medicine. This list may not describe all possible interactions. Give your health care provider a list of all the medicines, herbs, non-prescription drugs, or dietary supplements you use. Also tell them if you smoke, drink alcohol, or use illegal drugs. Some items may interact with your medicine. What should I watch for while using this medicine? You may need blood work done while you are taking this medicine. If you are going to need a MRI, CT scan, or other procedure, tell your doctor that you are using this medicine (On-Body Injector only). What side effects may I notice from receiving this medicine? Side effects that you should report to your doctor or health care professional as soon as possible: -allergic reactions like skin rash, itching or hives, swelling of the face, lips, or tongue -dizziness -fever -pain, redness, or irritation at site where injected -pinpoint red spots on the skin -red or dark-brown urine -shortness of breath or breathing problems -stomach or side pain, or pain   at the shoulder -swelling -tiredness -trouble passing urine or change in the amount of urine Side effects that usually do not require medical attention (report to your doctor or health care professional if they continue or are  bothersome): -bone pain -muscle pain This list may not describe all possible side effects. Call your doctor for medical advice about side effects. You may report side effects to FDA at 1-800-FDA-1088. Where should I keep my medicine? Keep out of the reach of children. Store pre-filled syringes in a refrigerator between 2 and 8 degrees C (36 and 46 degrees F). Do not freeze. Keep in carton to protect from light. Throw away this medicine if it is left out of the refrigerator for more than 48 hours. Throw away any unused medicine after the expiration date. NOTE: This sheet is a summary. It may not cover all possible information. If you have questions about this medicine, talk to your doctor, pharmacist, or health care provider.    2016, [employer] Standard. (2014-02-21 14:30:14)  

## 2015-11-04 ENCOUNTER — Ambulatory Visit: Payer: Medicare Other | Admitting: Internal Medicine

## 2015-11-04 ENCOUNTER — Ambulatory Visit: Payer: Medicare Other

## 2015-11-04 ENCOUNTER — Other Ambulatory Visit (HOSPITAL_BASED_OUTPATIENT_CLINIC_OR_DEPARTMENT_OTHER): Payer: Medicare Other

## 2015-11-04 DIAGNOSIS — Z79899 Other long term (current) drug therapy: Secondary | ICD-10-CM | POA: Diagnosis not present

## 2015-11-04 DIAGNOSIS — C3492 Malignant neoplasm of unspecified part of left bronchus or lung: Secondary | ICD-10-CM

## 2015-11-04 DIAGNOSIS — Z5111 Encounter for antineoplastic chemotherapy: Secondary | ICD-10-CM

## 2015-11-04 DIAGNOSIS — N182 Chronic kidney disease, stage 2 (mild): Secondary | ICD-10-CM

## 2015-11-04 DIAGNOSIS — C3432 Malignant neoplasm of lower lobe, left bronchus or lung: Secondary | ICD-10-CM | POA: Diagnosis present

## 2015-11-04 LAB — CBC WITH DIFFERENTIAL/PLATELET
BASO%: 0.6 % (ref 0.0–2.0)
BASOS ABS: 0 10*3/uL (ref 0.0–0.1)
EOS%: 0.5 % (ref 0.0–7.0)
Eosinophils Absolute: 0 10*3/uL (ref 0.0–0.5)
HEMATOCRIT: 36.5 % — AB (ref 38.4–49.9)
HGB: 11.5 g/dL — ABNORMAL LOW (ref 13.0–17.1)
LYMPH#: 1.1 10*3/uL (ref 0.9–3.3)
LYMPH%: 29.7 % (ref 14.0–49.0)
MCH: 22.6 pg — AB (ref 27.2–33.4)
MCHC: 31.4 g/dL — AB (ref 32.0–36.0)
MCV: 71.9 fL — ABNORMAL LOW (ref 79.3–98.0)
MONO#: 1.1 10*3/uL — ABNORMAL HIGH (ref 0.1–0.9)
MONO%: 31.5 % — ABNORMAL HIGH (ref 0.0–14.0)
NEUT#: 1.4 10*3/uL — ABNORMAL LOW (ref 1.5–6.5)
NEUT%: 37.7 % — AB (ref 39.0–75.0)
PLATELETS: 117 10*3/uL — AB (ref 140–400)
RBC: 5.08 10*6/uL (ref 4.20–5.82)
RDW: 15.8 % — ABNORMAL HIGH (ref 11.0–14.6)
WBC: 3.6 10*3/uL — ABNORMAL LOW (ref 4.0–10.3)

## 2015-11-04 LAB — COMPREHENSIVE METABOLIC PANEL
ALT: 13 U/L (ref 0–55)
ANION GAP: 8 meq/L (ref 3–11)
AST: 13 U/L (ref 5–34)
Albumin: 3.1 g/dL — ABNORMAL LOW (ref 3.5–5.0)
Alkaline Phosphatase: 90 U/L (ref 40–150)
BUN: 18.4 mg/dL (ref 7.0–26.0)
CALCIUM: 9.7 mg/dL (ref 8.4–10.4)
CHLORIDE: 103 meq/L (ref 98–109)
CO2: 27 mEq/L (ref 22–29)
Creatinine: 1.2 mg/dL (ref 0.7–1.3)
EGFR: 76 mL/min/{1.73_m2} — ABNORMAL LOW (ref >90)
Glucose: 127 mg/dl (ref 70–140)
POTASSIUM: 4.6 meq/L (ref 3.5–5.1)
Sodium: 138 mEq/L (ref 136–145)
Total Bilirubin: 0.39 mg/dL (ref 0.20–1.20)
Total Protein: 6.7 g/dL (ref 6.4–8.3)

## 2015-11-04 LAB — UA PROTEIN, DIPSTICK - CHCC: PROTEIN: 100 mg/dL

## 2015-11-06 ENCOUNTER — Ambulatory Visit: Payer: Medicare Other

## 2015-11-11 ENCOUNTER — Other Ambulatory Visit (HOSPITAL_BASED_OUTPATIENT_CLINIC_OR_DEPARTMENT_OTHER): Payer: Medicare Other

## 2015-11-11 ENCOUNTER — Ambulatory Visit: Payer: Medicare Other | Admitting: Internal Medicine

## 2015-11-11 ENCOUNTER — Ambulatory Visit: Payer: Medicare Other

## 2015-11-11 ENCOUNTER — Other Ambulatory Visit: Payer: Medicare Other

## 2015-11-11 DIAGNOSIS — N182 Chronic kidney disease, stage 2 (mild): Secondary | ICD-10-CM

## 2015-11-11 DIAGNOSIS — Z5111 Encounter for antineoplastic chemotherapy: Secondary | ICD-10-CM

## 2015-11-11 DIAGNOSIS — C3492 Malignant neoplasm of unspecified part of left bronchus or lung: Secondary | ICD-10-CM

## 2015-11-11 LAB — CBC WITH DIFFERENTIAL/PLATELET
BASO%: 0.2 % (ref 0.0–2.0)
BASOS ABS: 0 10*3/uL (ref 0.0–0.1)
EOS ABS: 0 10*3/uL (ref 0.0–0.5)
EOS%: 0 % (ref 0.0–7.0)
HEMATOCRIT: 36.5 % — AB (ref 38.4–49.9)
HGB: 11.3 g/dL — ABNORMAL LOW (ref 13.0–17.1)
LYMPH#: 1.7 10*3/uL (ref 0.9–3.3)
LYMPH%: 9.6 % — ABNORMAL LOW (ref 14.0–49.0)
MCH: 22.2 pg — AB (ref 27.2–33.4)
MCHC: 30.9 g/dL — AB (ref 32.0–36.0)
MCV: 72 fL — AB (ref 79.3–98.0)
MONO#: 1.2 10*3/uL — ABNORMAL HIGH (ref 0.1–0.9)
MONO%: 6.6 % (ref 0.0–14.0)
NEUT#: 14.9 10*3/uL — ABNORMAL HIGH (ref 1.5–6.5)
NEUT%: 83.6 % — AB (ref 39.0–75.0)
Platelets: 247 10*3/uL (ref 140–400)
RBC: 5.07 10*6/uL (ref 4.20–5.82)
RDW: 16.3 % — ABNORMAL HIGH (ref 11.0–14.6)
WBC: 17.8 10*3/uL — ABNORMAL HIGH (ref 4.0–10.3)

## 2015-11-11 LAB — COMPREHENSIVE METABOLIC PANEL
ALK PHOS: 130 U/L (ref 40–150)
ALT: 11 U/L (ref 0–55)
AST: 15 U/L (ref 5–34)
Albumin: 3.4 g/dL — ABNORMAL LOW (ref 3.5–5.0)
Anion Gap: 9 mEq/L (ref 3–11)
BUN: 19.5 mg/dL (ref 7.0–26.0)
CALCIUM: 9.1 mg/dL (ref 8.4–10.4)
CHLORIDE: 107 meq/L (ref 98–109)
CO2: 24 mEq/L (ref 22–29)
Creatinine: 1.3 mg/dL (ref 0.7–1.3)
EGFR: 70 mL/min/{1.73_m2} — AB (ref >90)
Glucose: 150 mg/dl — ABNORMAL HIGH (ref 70–140)
POTASSIUM: 5.1 meq/L (ref 3.5–5.1)
Sodium: 141 mEq/L (ref 136–145)
Total Bilirubin: <0.3 mg/dL (ref 0.20–1.20)
Total Protein: 7 g/dL (ref 6.4–8.3)

## 2015-11-18 ENCOUNTER — Encounter: Payer: Self-pay | Admitting: Internal Medicine

## 2015-11-18 ENCOUNTER — Ambulatory Visit (HOSPITAL_BASED_OUTPATIENT_CLINIC_OR_DEPARTMENT_OTHER): Payer: Medicare Other | Admitting: Internal Medicine

## 2015-11-18 ENCOUNTER — Other Ambulatory Visit: Payer: Self-pay | Admitting: Medical Oncology

## 2015-11-18 ENCOUNTER — Observation Stay (HOSPITAL_COMMUNITY)
Admission: EM | Admit: 2015-11-18 | Discharge: 2015-11-19 | Disposition: A | Discharge status: Home / Self Care | Payer: Medicare Other | Attending: Internal Medicine | Admitting: Internal Medicine

## 2015-11-18 ENCOUNTER — Other Ambulatory Visit: Payer: Self-pay

## 2015-11-18 ENCOUNTER — Encounter (HOSPITAL_COMMUNITY): Payer: Self-pay | Admitting: Emergency Medicine

## 2015-11-18 ENCOUNTER — Ambulatory Visit: Payer: Medicare Other

## 2015-11-18 ENCOUNTER — Other Ambulatory Visit: Payer: Medicare Other

## 2015-11-18 ENCOUNTER — Telehealth: Payer: Self-pay | Admitting: Internal Medicine

## 2015-11-18 ENCOUNTER — Other Ambulatory Visit (HOSPITAL_BASED_OUTPATIENT_CLINIC_OR_DEPARTMENT_OTHER): Payer: Medicare Other

## 2015-11-18 VITALS — BP 150/83 | HR 82 | Temp 98.0°F | Resp 17 | Ht 71.0 in | Wt 178.8 lb

## 2015-11-18 DIAGNOSIS — Z794 Long term (current) use of insulin: Secondary | ICD-10-CM | POA: Insufficient documentation

## 2015-11-18 DIAGNOSIS — Z833 Family history of diabetes mellitus: Secondary | ICD-10-CM | POA: Insufficient documentation

## 2015-11-18 DIAGNOSIS — N182 Chronic kidney disease, stage 2 (mild): Secondary | ICD-10-CM | POA: Diagnosis not present

## 2015-11-18 DIAGNOSIS — E1142 Type 2 diabetes mellitus with diabetic polyneuropathy: Secondary | ICD-10-CM | POA: Diagnosis not present

## 2015-11-18 DIAGNOSIS — G43909 Migraine, unspecified, not intractable, without status migrainosus: Secondary | ICD-10-CM | POA: Diagnosis not present

## 2015-11-18 DIAGNOSIS — C3432 Malignant neoplasm of lower lobe, left bronchus or lung: Secondary | ICD-10-CM | POA: Diagnosis not present

## 2015-11-18 DIAGNOSIS — F1010 Alcohol abuse, uncomplicated: Secondary | ICD-10-CM

## 2015-11-18 DIAGNOSIS — C3492 Malignant neoplasm of unspecified part of left bronchus or lung: Secondary | ICD-10-CM

## 2015-11-18 DIAGNOSIS — Z9889 Other specified postprocedural states: Secondary | ICD-10-CM | POA: Insufficient documentation

## 2015-11-18 DIAGNOSIS — E875 Hyperkalemia: Secondary | ICD-10-CM

## 2015-11-18 DIAGNOSIS — Z5111 Encounter for antineoplastic chemotherapy: Secondary | ICD-10-CM

## 2015-11-18 DIAGNOSIS — Z79891 Long term (current) use of opiate analgesic: Secondary | ICD-10-CM | POA: Diagnosis not present

## 2015-11-18 DIAGNOSIS — Z9842 Cataract extraction status, left eye: Secondary | ICD-10-CM | POA: Insufficient documentation

## 2015-11-18 DIAGNOSIS — F1410 Cocaine abuse, uncomplicated: Secondary | ICD-10-CM

## 2015-11-18 DIAGNOSIS — E1122 Type 2 diabetes mellitus with diabetic chronic kidney disease: Secondary | ICD-10-CM | POA: Insufficient documentation

## 2015-11-18 DIAGNOSIS — I5032 Chronic diastolic (congestive) heart failure: Secondary | ICD-10-CM | POA: Diagnosis not present

## 2015-11-18 DIAGNOSIS — Z7982 Long term (current) use of aspirin: Secondary | ICD-10-CM | POA: Diagnosis not present

## 2015-11-18 DIAGNOSIS — E860 Dehydration: Secondary | ICD-10-CM | POA: Diagnosis not present

## 2015-11-18 DIAGNOSIS — Z9841 Cataract extraction status, right eye: Secondary | ICD-10-CM | POA: Diagnosis not present

## 2015-11-18 DIAGNOSIS — R110 Nausea: Secondary | ICD-10-CM | POA: Diagnosis not present

## 2015-11-18 DIAGNOSIS — R5383 Other fatigue: Secondary | ICD-10-CM | POA: Diagnosis not present

## 2015-11-18 DIAGNOSIS — Z820 Family history of epilepsy and other diseases of the nervous system: Secondary | ICD-10-CM | POA: Insufficient documentation

## 2015-11-18 DIAGNOSIS — I509 Heart failure, unspecified: Secondary | ICD-10-CM | POA: Diagnosis not present

## 2015-11-18 DIAGNOSIS — N179 Acute kidney failure, unspecified: Secondary | ICD-10-CM | POA: Diagnosis not present

## 2015-11-18 DIAGNOSIS — F17210 Nicotine dependence, cigarettes, uncomplicated: Secondary | ICD-10-CM | POA: Insufficient documentation

## 2015-11-18 DIAGNOSIS — I10 Essential (primary) hypertension: Secondary | ICD-10-CM | POA: Diagnosis not present

## 2015-11-18 DIAGNOSIS — F329 Major depressive disorder, single episode, unspecified: Secondary | ICD-10-CM | POA: Insufficient documentation

## 2015-11-18 DIAGNOSIS — N189 Chronic kidney disease, unspecified: Secondary | ICD-10-CM | POA: Diagnosis present

## 2015-11-18 DIAGNOSIS — E785 Hyperlipidemia, unspecified: Secondary | ICD-10-CM | POA: Insufficient documentation

## 2015-11-18 DIAGNOSIS — M1990 Unspecified osteoarthritis, unspecified site: Secondary | ICD-10-CM | POA: Insufficient documentation

## 2015-11-18 DIAGNOSIS — E1139 Type 2 diabetes mellitus with other diabetic ophthalmic complication: Secondary | ICD-10-CM | POA: Diagnosis not present

## 2015-11-18 DIAGNOSIS — Z8349 Family history of other endocrine, nutritional and metabolic diseases: Secondary | ICD-10-CM | POA: Insufficient documentation

## 2015-11-18 DIAGNOSIS — Z716 Tobacco abuse counseling: Secondary | ICD-10-CM

## 2015-11-18 DIAGNOSIS — Z8249 Family history of ischemic heart disease and other diseases of the circulatory system: Secondary | ICD-10-CM | POA: Insufficient documentation

## 2015-11-18 DIAGNOSIS — C787 Secondary malignant neoplasm of liver and intrahepatic bile duct: Secondary | ICD-10-CM | POA: Diagnosis not present

## 2015-11-18 DIAGNOSIS — I130 Hypertensive heart and chronic kidney disease with heart failure and stage 1 through stage 4 chronic kidney disease, or unspecified chronic kidney disease: Secondary | ICD-10-CM | POA: Insufficient documentation

## 2015-11-18 DIAGNOSIS — Z79899 Other long term (current) drug therapy: Secondary | ICD-10-CM | POA: Insufficient documentation

## 2015-11-18 LAB — CBC WITH DIFFERENTIAL/PLATELET
BASO%: 0.4 % (ref 0.0–2.0)
Basophils Absolute: 0.1 10*3/uL (ref 0.0–0.1)
EOS%: 0 % (ref 0.0–7.0)
Eosinophils Absolute: 0 10*3/uL (ref 0.0–0.5)
HEMATOCRIT: 37.6 % — AB (ref 38.4–49.9)
HGB: 11.7 g/dL — ABNORMAL LOW (ref 13.0–17.1)
LYMPH#: 1.3 10*3/uL (ref 0.9–3.3)
LYMPH%: 4.8 % — ABNORMAL LOW (ref 14.0–49.0)
MCH: 22.3 pg — ABNORMAL LOW (ref 27.2–33.4)
MCHC: 31.1 g/dL — AB (ref 32.0–36.0)
MCV: 71.9 fL — ABNORMAL LOW (ref 79.3–98.0)
MONO#: 0.8 10*3/uL (ref 0.1–0.9)
MONO%: 3 % (ref 0.0–14.0)
NEUT#: 25.3 10*3/uL — ABNORMAL HIGH (ref 1.5–6.5)
NEUT%: 91.8 % — AB (ref 39.0–75.0)
Platelets: 318 10*3/uL (ref 140–400)
RBC: 5.23 10*6/uL (ref 4.20–5.82)
RDW: 16.3 % — ABNORMAL HIGH (ref 11.0–14.6)
WBC: 27.6 10*3/uL — ABNORMAL HIGH (ref 4.0–10.3)

## 2015-11-18 LAB — COMPREHENSIVE METABOLIC PANEL
ALT: 13 U/L (ref 0–55)
ANION GAP: 9 meq/L (ref 3–11)
AST: 13 U/L (ref 5–34)
Albumin: 3.8 g/dL (ref 3.5–5.0)
Alkaline Phosphatase: 98 U/L (ref 40–150)
BUN: 30.3 mg/dL — ABNORMAL HIGH (ref 7.0–26.0)
CHLORIDE: 108 meq/L (ref 98–109)
CO2: 19 meq/L — AB (ref 22–29)
Calcium: 10 mg/dL (ref 8.4–10.4)
Creatinine: 2.1 mg/dL — ABNORMAL HIGH (ref 0.7–1.3)
EGFR: 40 mL/min/{1.73_m2} — AB (ref >90)
GLUCOSE: 203 mg/dL — AB (ref 70–140)
SODIUM: 136 meq/L (ref 136–145)
TOTAL PROTEIN: 7.5 g/dL (ref 6.4–8.3)
Total Bilirubin: <0.3 mg/dL (ref 0.20–1.20)

## 2015-11-18 LAB — RAPID URINE DRUG SCREEN, HOSP PERFORMED
Amphetamines: NOT DETECTED
Barbiturates: NOT DETECTED
Benzodiazepines: NOT DETECTED
Cocaine: NOT DETECTED
OPIATES: POSITIVE — AB
Tetrahydrocannabinol: NOT DETECTED

## 2015-11-18 LAB — CREATININE, SERUM
Creatinine, Ser: 1.83 mg/dL — ABNORMAL HIGH (ref 0.61–1.24)
GFR calc Af Amer: 46 mL/min — ABNORMAL LOW (ref >60)
GFR, EST NON AFRICAN AMERICAN: 40 mL/min — AB (ref >60)

## 2015-11-18 LAB — CBC
HCT: 32.7 % — ABNORMAL LOW (ref 39.0–52.0)
Hemoglobin: 10.9 g/dL — ABNORMAL LOW (ref 13.0–17.0)
MCH: 22.9 pg — ABNORMAL LOW (ref 26.0–34.0)
MCHC: 33.3 g/dL (ref 30.0–36.0)
MCV: 68.6 fL — ABNORMAL LOW (ref 78.0–100.0)
PLATELETS: 273 10*3/uL (ref 150–400)
RBC: 4.77 MIL/uL (ref 4.22–5.81)
RDW: 15.8 % — AB (ref 11.5–15.5)
WBC: 26.8 10*3/uL — ABNORMAL HIGH (ref 4.0–10.5)

## 2015-11-18 LAB — POTASSIUM (CC13): Potassium: 6.7 meq/L (ref 3.5–5.1)

## 2015-11-18 LAB — MAGNESIUM: Magnesium: 1.5 mg/dL — ABNORMAL LOW (ref 1.7–2.4)

## 2015-11-18 LAB — MRSA PCR SCREENING: MRSA by PCR: NEGATIVE

## 2015-11-18 LAB — GLUCOSE, CAPILLARY
GLUCOSE-CAPILLARY: 184 mg/dL — AB (ref 65–99)
GLUCOSE-CAPILLARY: 276 mg/dL — AB (ref 65–99)

## 2015-11-18 LAB — UA PROTEIN, DIPSTICK - CHCC: Protein, ur: 100 mg/dL

## 2015-11-18 LAB — PHOSPHORUS: Phosphorus: 3.3 mg/dL (ref 2.5–4.6)

## 2015-11-18 LAB — TSH: TSH: 2.503 u[IU]/mL (ref 0.350–4.500)

## 2015-11-18 MED ORDER — NICOTINE 14 MG/24HR TD PT24
14.0000 mg | MEDICATED_PATCH | Freq: Every day | TRANSDERMAL | Status: DC
  Administered 2015-11-18 – 2015-11-19 (×2): 14 mg via TRANSDERMAL
  Filled 2015-11-18 (×2): qty 1

## 2015-11-18 MED ORDER — INSULIN ASPART 100 UNIT/ML ~~LOC~~ SOLN
0.0000 [IU] | Freq: Every day | SUBCUTANEOUS | Status: DC
  Administered 2015-11-18: 3 [IU] via SUBCUTANEOUS

## 2015-11-18 MED ORDER — INFLUENZA VAC SPLIT QUAD 0.5 ML IM SUSY
0.5000 mL | PREFILLED_SYRINGE | INTRAMUSCULAR | Status: AC
  Administered 2015-11-19: 0.5 mL via INTRAMUSCULAR
  Filled 2015-11-18: qty 0.5

## 2015-11-18 MED ORDER — ACETAMINOPHEN 325 MG PO TABS: 650.0000 mg | ORAL_TABLET | Freq: Four times a day (QID) | ORAL | Status: DC | PRN

## 2015-11-18 MED ORDER — FERROUS SULFATE 325 (65 FE) MG PO TABS
325.0000 mg | ORAL_TABLET | Freq: Two times a day (BID) | ORAL | Status: DC
  Administered 2015-11-18 – 2015-11-19 (×2): 325 mg via ORAL
  Filled 2015-11-18 (×2): qty 1

## 2015-11-18 MED ORDER — DEXTROSE 50 % IV SOLN
25.0000 g | Freq: Once | INTRAVENOUS | Status: AC
  Administered 2015-11-18: 25 g via INTRAVENOUS
  Filled 2015-11-18: qty 50

## 2015-11-18 MED ORDER — CALCIUM GLUCONATE 10 % IV SOLN: 1.0000 g | Freq: Once | INTRAVENOUS | Status: DC

## 2015-11-18 MED ORDER — SODIUM CHLORIDE 0.9 % IV SOLN
INTRAVENOUS | Status: DC
  Administered 2015-11-18 – 2015-11-19 (×3): via INTRAVENOUS

## 2015-11-18 MED ORDER — HYDROCODONE-ACETAMINOPHEN 5-325 MG PO TABS: 1.0000 | ORAL_TABLET | ORAL | Status: DC | PRN

## 2015-11-18 MED ORDER — IRON 325 (65 FE) MG PO TABS: 1.0000 | ORAL_TABLET | Freq: Two times a day (BID) | ORAL | Status: DC

## 2015-11-18 MED ORDER — ACETAMINOPHEN 650 MG RE SUPP
650.0000 mg | Freq: Four times a day (QID) | RECTAL | Status: DC | PRN
Start: 2015-11-18 — End: 2015-11-19

## 2015-11-18 MED ORDER — HYDRALAZINE HCL 10 MG PO TABS
10.0000 mg | ORAL_TABLET | Freq: Three times a day (TID) | ORAL | Status: DC
  Administered 2015-11-18 – 2015-11-19 (×4): 10 mg via ORAL
  Filled 2015-11-18 (×4): qty 1

## 2015-11-18 MED ORDER — SODIUM CHLORIDE 0.9 % IV SOLN: INTRAVENOUS | Status: DC

## 2015-11-18 MED ORDER — ASPIRIN EC 81 MG PO TBEC
81.0000 mg | DELAYED_RELEASE_TABLET | Freq: Every morning | ORAL | Status: DC
  Administered 2015-11-19: 81 mg via ORAL
  Filled 2015-11-18: qty 1

## 2015-11-18 MED ORDER — SIMVASTATIN 40 MG PO TABS
40.0000 mg | ORAL_TABLET | Freq: Every morning | ORAL | Status: DC
  Administered 2015-11-19: 40 mg via ORAL
  Filled 2015-11-18: qty 1

## 2015-11-18 MED ORDER — INSULIN ASPART 100 UNIT/ML ~~LOC~~ SOLN
3.0000 [IU] | Freq: Three times a day (TID) | SUBCUTANEOUS | Status: DC
  Administered 2015-11-18 – 2015-11-19 (×3): 3 [IU] via SUBCUTANEOUS

## 2015-11-18 MED ORDER — SODIUM CHLORIDE 0.9% FLUSH
10.0000 mL | INTRAVENOUS | Status: DC | PRN
  Administered 2015-11-19: 10 mL
  Filled 2015-11-18: qty 40

## 2015-11-18 MED ORDER — SODIUM BICARBONATE 8.4 % IV SOLN
50.0000 meq | Freq: Once | INTRAVENOUS | Status: AC
  Administered 2015-11-18: 50 meq via INTRAVENOUS
  Filled 2015-11-18: qty 50

## 2015-11-18 MED ORDER — INSULIN ASPART 100 UNIT/ML IV SOLN
8.0000 [IU] | Freq: Once | INTRAVENOUS | Status: AC
  Administered 2015-11-18: 8 [IU] via INTRAVENOUS
  Filled 2015-11-18: qty 0.08

## 2015-11-18 MED ORDER — SODIUM CHLORIDE 0.9 % IV SOLN
1.0000 g | Freq: Once | INTRAVENOUS | Status: AC
  Administered 2015-11-18: 1 g via INTRAVENOUS
  Filled 2015-11-18: qty 10

## 2015-11-18 MED ORDER — TEMAZEPAM 15 MG PO CAPS
30.0000 mg | ORAL_CAPSULE | Freq: Every day | ORAL | Status: DC
  Administered 2015-11-18: 30 mg via ORAL
  Filled 2015-11-18: qty 2

## 2015-11-18 MED ORDER — SODIUM CHLORIDE 0.9% FLUSH: 10.0000 mL | Freq: Two times a day (BID) | INTRAVENOUS | Status: DC

## 2015-11-18 MED ORDER — SODIUM POLYSTYRENE SULFONATE 15 GM/60ML PO SUSP
15.0000 g | Freq: Once | ORAL | Status: AC
  Administered 2015-11-18: 15 g via ORAL
  Filled 2015-11-18: qty 60

## 2015-11-18 MED ORDER — SODIUM CHLORIDE 0.9% FLUSH: 3.0000 mL | Freq: Two times a day (BID) | INTRAVENOUS | Status: DC

## 2015-11-18 MED ORDER — DOCUSATE SODIUM 100 MG PO CAPS
100.0000 mg | ORAL_CAPSULE | Freq: Two times a day (BID) | ORAL | Status: DC
  Administered 2015-11-18 – 2015-11-19 (×2): 100 mg via ORAL
  Filled 2015-11-18 (×2): qty 1

## 2015-11-18 MED ORDER — ALBUTEROL SULFATE (2.5 MG/3ML) 0.083% IN NEBU: 2.5000 mg | INHALATION_SOLUTION | RESPIRATORY_TRACT | Status: DC | PRN

## 2015-11-18 MED ORDER — SENNOSIDES-DOCUSATE SODIUM 8.6-50 MG PO TABS: 1.0000 | ORAL_TABLET | Freq: Every evening | ORAL | Status: DC | PRN

## 2015-11-18 MED ORDER — SODIUM POLYSTYRENE SULFONATE 15 GM/60ML PO SUSP
30.0000 g | Freq: Once | ORAL | Status: AC
  Administered 2015-11-18: 30 g via ORAL
  Filled 2015-11-18: qty 120

## 2015-11-18 MED ORDER — ONDANSETRON HCL 4 MG PO TABS: 4.0000 mg | ORAL_TABLET | Freq: Four times a day (QID) | ORAL | Status: DC | PRN

## 2015-11-18 MED ORDER — ENOXAPARIN SODIUM 40 MG/0.4ML ~~LOC~~ SOLN
40.0000 mg | SUBCUTANEOUS | Status: DC
  Administered 2015-11-18: 40 mg via SUBCUTANEOUS
  Filled 2015-11-18: qty 0.4

## 2015-11-18 MED ORDER — VITAMIN C 500 MG PO TABS
500.0000 mg | ORAL_TABLET | Freq: Two times a day (BID) | ORAL | Status: DC
  Administered 2015-11-18 – 2015-11-19 (×2): 500 mg via ORAL
  Filled 2015-11-18 (×2): qty 1

## 2015-11-18 MED ORDER — INSULIN ASPART 100 UNIT/ML ~~LOC~~ SOLN
0.0000 [IU] | Freq: Three times a day (TID) | SUBCUTANEOUS | Status: DC
  Administered 2015-11-18 – 2015-11-19 (×3): 3 [IU] via SUBCUTANEOUS

## 2015-11-18 MED ORDER — ONDANSETRON HCL 4 MG/2ML IJ SOLN: 4.0000 mg | Freq: Four times a day (QID) | INTRAMUSCULAR | Status: DC | PRN

## 2015-11-18 MED ORDER — SODIUM CHLORIDE 0.9 % IV BOLUS (SEPSIS)
1000.0000 mL | Freq: Once | INTRAVENOUS | Status: AC
  Administered 2015-11-18: 1000 mL via INTRAVENOUS

## 2015-11-18 NOTE — Progress Notes (Signed)
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  PRIOR THERAPY:  1) Systemic chemotherapy with carboplatin for AUC of 5 given on day 1 and gemcitabine 1000 MG/M2 on days 1 and 8 every 3 weeks. Status post 6 cycles with partial response. 2) Nivolumab 240 MG IV every 2 weeks status post 4 cycles, last dose was given 03/18/2015 discontinued today secondary to disease progression.  CURRENT THERAPY: Systemic chemotherapy with docetaxel 75 MG/M2 and Cyramza 10 MG/KG every 3 weeks, first dose 04/08/2015. Status post 9 cycles.  CODE STATUS: Full code initially but no prolonged resuscitation.   INTERVAL HISTORY: Keith Holloway 55 y.o. male returns to the clinic today for follow-up visit accompanied by his daughter. The patient is feeling fine today with no specific complaints. The patient is feeling fine today was no specific complaints. He tolerated the last cycle of his treatment fairly well with no significant adverse effects. He is currently on losartan for hypertension and his potassium has always been elevated recently. Repeat comprehensive metabolic panel today showed potassium of 6.7. The patient denied having any significant chest pain, shortness breath, cough or hemoptysis. He denied having any nausea or vomiting but has constipation. No significant fever or chills. He is here today for evaluation before starting cycle #10.  MEDICAL HISTORY: Past Medical History:  Diagnosis Date  . Alcohol abuse   . Arthritis   . Borderline hypertension   . CHF (congestive heart failure) ([place])   . CKD (chronic kidney disease), stage III   . Cocaine abuse    last [employer] postivie for cocaine 09-30-2015  . Depression   . Dyspnea on effort   . History of acute  renal failure    09-30-2015  . History of hyperkalemia    admission 09-30-2015 potassiom 8.0  with medications currently 10-14-2015 down to 4.7 normal  . History of traumatic head injury    1987  w/ LOC   --- no residual  . Hyperlipidemia   . 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 Memory disorder 08/22/2013   alcohol abuse  . Migraine   . Nocturia   . Non-small cell carcinoma of lung, stage 4 [facility]) oncologist-  dr Julien Nordmann   w/ METs to liver dx 04/ 2016--  Stage IV (T2a, N3, M1b), squamous cell carcinoma of left lower lobe---  systemic chemotherapy every 3 weeks and currently due to evidence for disease progression has started immunotherapy  . Pilonidal abscess    recurrent  . Pleural effusion, bilateral    per ct 10-09-2015  new small right effusion and left small to moderate effusion , increased  . Polyneuropathy in diabetes(357.2)   . Port-a-cath in place   . Pulmonary nodules/lesions, multiple    pt has stage 4 lung cancer  . Tobacco abuse counseling 10/09/2014  . Type 2 diabetes mellitus with insulin therapy ([place])   . Wears dentures    upper    ALLERGIES:  has No Known Allergies.  MEDICATIONS:  Current Outpatient Prescriptions  Medication Sig Dispense Refill  . acetaminophen (TYLENOL) 325 MG tablet Take 2 tablets (650 mg total) by mouth every 6 (six) hours as needed for mild pain (or Fever >/= 101). 30 tablet  0  . aspirin EC 81 MG tablet Take 1 tablet by mouth every morning.     Marland Kitchen dexamethasone (DECADRON) 4 MG tablet 2 TABLETS BY MOUTH TWICE A DAY THE DAY BEFORE, DAY OF AND DAY AFTER THE CHEMOTHERAPY EVERY 3 WEEKS 40 tablet 1  . Ferrous Sulfate (IRON) 325 (65 Fe) MG TABS Take 1 tablet by mouth every morning.     Marland Kitchen HYDROcodone-acetaminophen (NORCO/VICODIN) 5-325 MG tablet Take 2 tablets by mouth every 6 (six) hours as needed. 30 tablet 0  . insulin NPH-regular Human (NOVOLIN 70/30) (70-30) 100 UNIT/ML injection Inject 35 Units  into the skin 2 (two) times daily with a meal. (Patient taking differently: Inject 15 Units into the skin 2 (two) times daily with a meal. ONLY IF CBG IS OVER 200.) 10 mL 11  . lidocaine-prilocaine (EMLA) cream Apply 1 application topically as needed. 30 g 0  . losartan-hydrochlorothiazide (HYZAAR) 100-25 MG tablet Take 1 tablet by mouth daily.    . nicotine (NICODERM CQ - DOSED IN MG/24 HOURS) 14 mg/24hr patch Place 14 mg onto the skin daily.    . ondansetron (ZOFRAN) 8 MG tablet TAKE 1 TABLET EVERY 8 HOURS AS NEEDED FOR NAUSEA AND VOMITING 20 tablet 1  . prochlorperazine (COMPAZINE) 10 MG tablet TAKE 1 TABLET (10 MG TOTAL) BY MOUTH EVERY 6 (SIX) HOURS AS NEEDED FOR NAUSEA OR VOMITING. 30 tablet 0  . simvastatin (ZOCOR) 40 MG tablet Take 1 tablet (40 mg total) by mouth daily. (Patient taking differently: Take 40 mg by mouth every morning. ) 30 tablet 3  . temazepam (RESTORIL) 30 MG capsule TAKE ONE CAPSULE AT BEDTIME (Patient taking differently: TAKE ONE CAPSULE AT BEDTIME--  PRN) 30 capsule 0  . vitamin C (ASCORBIC ACID) 500 MG tablet Take 500 mg by mouth daily.     No current facility-administered medications for this visit.     SURGICAL HISTORY:  Past Surgical History:  Procedure Laterality Date  . CATARACT EXTRACTION, BILATERAL  08/2012  . PILONIDAL CYST EXCISION N/A 10/17/2015   Procedure: EXCISION PILONIDAL DISEASE;  Surgeon: Leighton Ruff, MD;  Location[facility];  Service: General;  Laterality: N/A;  . PORTACATH PLACEMENT  01/30/2015  . RETINAL DETACHMENT SURGERY Bilateral 04/2012    REVIEW OF SYSTEMS:  Constitutional: positive for fatigue Eyes: negative Ears, nose, mouth, throat, and face: negative Respiratory: positive for dyspnea on exertion Cardiovascular: negative Gastrointestinal: positive for constipation Genitourinary:negative Integument/breast: negative Hematologic/lymphatic: negative Musculoskeletal:negative Neurological: negative Behavioral/Psych:  negative Endocrine: negative Allergic/Immunologic: negative   PHYSICAL EXAMINATION: General appearance: alert, cooperative, fatigued and no distress Head: Normocephalic, without obvious abnormality, atraumatic Neck: no adenopathy, no JVD, supple, symmetrical, trachea midline and thyroid not enlarged, symmetric, no tenderness/mass/nodules Lymph nodes: Cervical, supraclavicular, and axillary nodes normal. Resp: clear to auscultation bilaterally Back: symmetric, no curvature. ROM normal. No CVA tenderness. Cardio: regular rate and rhythm, S1, S2 normal, no murmur, click, rub or gallop GI: soft, non-tender; bowel sounds normal; no masses,  no organomegaly Extremities: extremities normal, atraumatic, no cyanosis or edema Neurologic: Alert and oriented X 3, normal strength and tone. Normal symmetric reflexes. Normal coordination and gait  ECOG PERFORMANCE STATUS: 1 - Symptomatic but completely ambulatory  Blood pressure (!) 150/83, pulse 82, temperature 98 F (36.7 C), temperature source Oral, resp. rate 17, height '5\' 11"'$  (1.803 m), weight 178 lb 12.8 oz (81.1 kg), SpO2 100 %.  LABORATORY DATA: Lab Results  Component Value Date   WBC 27.6 (H) 11/18/2015   HGB 11.7 (  L) 11/18/2015   HCT 37.6 (L) 11/18/2015   MCV 71.9 (L) 11/18/2015   PLT 318 11/18/2015      Chemistry      Component Value Date/Time   NA 141 11/11/2015 1457   K 5.1 11/11/2015 1457   CL 109 10/17/2015 1040   CO2 24 11/11/2015 1457   BUN 19.5 11/11/2015 1457   CREATININE 1.3 11/11/2015 1457   GLU 396 (H) 10/28/2015 1249      Component Value Date/Time   CALCIUM 9.1 11/11/2015 1457   ALKPHOS 130 11/11/2015 1457   AST 15 11/11/2015 1457   ALT 11 11/11/2015 1457   BILITOT <0.30 11/11/2015 1457       RADIOGRAPHIC STUDIES: No results found.  ASSESSMENT AND PLAN: This is a very pleasant 55 years old African-American male with a stage IV non-small cell lung cancer, squamous cell carcinoma who completed systemic  chemotherapy with carboplatin and gemcitabine status post 6 cycles. The patient has tolerated his treatment fairly well with no significant adverse effects except for mild fatigue and nausea. He had evidence for disease progression and the patient was started on treatment with immunotherapy with Nivolumab currently status post 4 cycles. Unfortunately the recent CT scan of the chest, abdomen and pelvis showed interval progression of his disease with increase in the size of the dominant anterior mediastinal mass and left lower lobe perihilar mass. There was also development of new subcarinal adenopathy and multiple new pulmonary nodules as well as questionable liver lesion. He is currently undergoing systemic chemotherapy with docetaxel and Cyramza status post 9 cycles. He is tolerating his treatment fairly well.I will start cycle #10 after improvement of his potassium. For the hyperkalemia, I will repeat basic metabolic panel today. If his potassium continues to be elevated, I will send the patient to the emergency department for further evaluation and management of his hyperkalemia. For hypertension, he is followed by Dr. Nancy Fetter and he may need change of his blood pressure medication because of the persistent hyperkalemia. For insomnia, he will continue on Restoril 30 mg by mouth daily at bedtime when necessary. He would come back for follow-up visit in 4 weeks for reevaluation before starting cycle #11. For the hyperkalemia, his potassium level is still pending for today. We will call the patient with recommendation depending on his potassium level today.  He was advised to call immediately if he has any concerning symptoms in the interval. The patient voices understanding of current disease status and treatment options and is in agreement with the current care plan.  All questions were answered. The patient knows to call the clinic with any problems, questions or concerns. We can certainly see the patient  much sooner if necessary.  Disclaimer: This note was dictated with voice recognition software. Similar sounding words can inadvertently be transcribed and may not be corrected upon review.

## 2015-11-18 NOTE — ED Provider Notes (Addendum)
[facility] DEPT Provider Note   CSN: [id number] Arrival date & time: 11/18/15  1240     History   Chief Complaint Chief Complaint  Patient presents with  . high potassium    HPI Keith Holloway is a 55 y.o. male.  Patient with hx lung ca, presents from cancer center, indicating they checked his potassium and it was high. - they told him it was very high.  Patient indicates is getting chemo for his lung cancer, but they elected not to give today, due to high K.  Patient denies any recent change in meds, or new meds. Denies nsaid use. Indicates has been eating and drinking normally, and making normal amount urine. Notes hx similar symptoms in past, several months ago - pt indicates no cause found.  Patient indicates otherwise does not feel acutely ill or sick.    The history is provided by the patient.    Past Medical History:  Diagnosis Date  . Alcohol abuse   . Arthritis   . Borderline hypertension   . CHF (congestive heart failure) ([place])   . CKD (chronic kidney disease), stage III   . Cocaine abuse    last [employer] postivie for cocaine 09-30-2015  . Depression   . Dyspnea on effort   . History of acute renal failure    09-30-2015  . History of hyperkalemia    admission 09-30-2015 potassiom 8.0  with medications currently 10-14-2015 down to 4.7 normal  . History of traumatic head injury    1987  w/ LOC   --- no residual  . Hyperlipidemia   . 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 Memory disorder 08/22/2013   alcohol abuse  . Migraine   . Nocturia   . Non-small cell carcinoma of lung, stage 4 [facility]) oncologist-  dr Julien Nordmann   w/ METs to liver dx 04/ 2016--  Stage IV (T2a, N3, M1b), squamous cell carcinoma of left lower lobe---  systemic chemotherapy every 3 weeks and currently due to evidence for disease progression has started immunotherapy  . Pilonidal abscess    recurrent  . Pleural effusion, bilateral    per ct  10-09-2015  new small right effusion and left small to moderate effusion , increased  . Polyneuropathy in diabetes(357.2)   . Port-a-cath in place   . Pulmonary nodules/lesions, multiple    pt has stage 4 lung cancer  . Tobacco abuse counseling 10/09/2014  . Type 2 diabetes mellitus with insulin therapy ([place])   . Wears dentures    upper    Patient Active Problem List   Diagnosis Date Noted  . Non-small cell carcinoma of left lung, stage 4 ([place]) 11/18/2015  . Acute kidney injury ([place]) 09/30/2015  . SIRS (systemic inflammatory response syndrome) ([place]) 09/30/2015  . Hyperkalemia 09/23/2015  . Skin infection 06/10/2015  . Insomnia 05/20/2015  . Dehydration 04/29/2015  . Encounter for antineoplastic immunotherapy 02/18/2015  . Chronic renal insufficiency 01/23/2015  . Tobacco abuse counseling 10/09/2014  . Encounter for antineoplastic chemotherapy 08/28/2014  . Non-small cell carcinoma of lung, stage 4 ([place]) 06/13/2014  . Other specified diabetes mellitus without complications 16/96/7893  . Essential hypertension, benign 01/23/2014  . Memory disorder 08/22/2013  . Alcohol abuse 07/31/2013  . Diabetes mellitus with ophthalmic complication ([place]) 81/02/7508  . HYPERLIPIDEMIA 04/14/2006    Past Surgical History:  Procedure Laterality Date  . CATARACT EXTRACTION, BILATERAL  08/2012  . PILONIDAL CYST EXCISION N/A 10/17/2015  Procedure: EXCISION PILONIDAL DISEASE;  Surgeon: Leighton Ruff, MD;  Location[facility];  Service: General;  Laterality: N/A;  . PORTACATH PLACEMENT  01/30/2015  . RETINAL DETACHMENT SURGERY Bilateral 04/2012       Home Medications    Prior to Admission medications   Medication Sig Start Date End Date Taking? Authorizing Provider  acetaminophen (TYLENOL) 325 MG tablet Take 2 tablets (650 mg total) by mouth every 6 (six) hours as needed for mild pain (or Fever >/= 101). 10/02/15   Lavina Hamman, MD  aspirin EC 81 MG tablet Take 1 tablet by  mouth every morning.     Historical Provider, MD  dexamethasone (DECADRON) 4 MG tablet 2 TABLETS BY MOUTH TWICE A DAY THE DAY BEFORE, DAY OF AND DAY AFTER THE CHEMOTHERAPY EVERY 3 WEEKS 08/12/15   Curt Bears, MD  Ferrous Sulfate (IRON) 325 (65 Fe) MG TABS Take 1 tablet by mouth every morning.     Historical Provider, MD  HYDROcodone-acetaminophen (NORCO/VICODIN) 5-325 MG tablet Take 2 tablets by mouth every 6 (six) hours as needed. 06/22/30   Leighton Ruff, MD  insulin NPH-regular Human (NOVOLIN 70/30) (70-30) 100 UNIT/ML injection Inject 35 Units into the skin 2 (two) times daily with a meal. Patient taking differently: Inject 15 Units into the skin 2 (two) times daily with a meal. ONLY IF CBG IS OVER 200. 01/23/14   Lorayne Marek, MD  lidocaine-prilocaine (EMLA) cream Apply 1 application topically as needed. 02/03/15   Curt Bears, MD  losartan-hydrochlorothiazide (HYZAAR) 100-25 MG tablet Take 1 tablet by mouth daily.    Historical Provider, MD  nicotine (NICODERM CQ - DOSED IN MG/24 HOURS) 14 mg/24hr patch Place 14 mg onto the skin daily.    Historical Provider, MD  ondansetron (ZOFRAN) 8 MG tablet TAKE 1 TABLET EVERY 8 HOURS AS NEEDED FOR NAUSEA AND VOMITING 06/09/15   Curt Bears, MD  prochlorperazine (COMPAZINE) 10 MG tablet TAKE 1 TABLET (10 MG TOTAL) BY MOUTH EVERY 6 (SIX) HOURS AS NEEDED FOR NAUSEA OR VOMITING. Patient not taking: Reported on 11/18/2015 04/24/15   Curt Bears, MD  simvastatin (ZOCOR) 40 MG tablet Take 1 tablet (40 mg total) by mouth daily. Patient taking differently: Take 40 mg by mouth every morning.  01/23/14   Lorayne Marek, MD  temazepam (RESTORIL) 30 MG capsule TAKE ONE CAPSULE AT BEDTIME Patient taking differently: TAKE ONE CAPSULE AT BEDTIME--  PRN 06/19/15   Curt Bears, MD  vitamin C (ASCORBIC ACID) 500 MG tablet Take 500 mg by mouth daily.    Historical Provider, MD    Family History Family History  Problem Relation Age of Onset  . Alzheimer's  disease Brother   . Cancer Father   . Hypertension Father   . Heart attack Father   . Hypertension Mother   . Gout Mother   . Diabetes Sister   . Hypertension Sister   . Hypertension Sister   . Hypertension Sister     Social History Social History  Substance Use Topics  . Smoking status: Former Smoker    Packs/day: 0.25    Years: 32.00    Types: Cigarettes    Quit date: 10/01/2015  . Smokeless tobacco: Never Used     Comment: recently stopped smoking uses nicoderm patch  . Alcohol use Yes     Comment: alcoholic     Allergies   Review of patient's allergies indicates no known allergies.   Review of Systems Review of Systems  Constitutional: Negative  for fever.  HENT: Negative for sore throat.   Eyes: Negative for redness.  Respiratory: Negative for shortness of breath.   Cardiovascular: Negative for chest pain.  Gastrointestinal: Negative for abdominal pain, diarrhea and vomiting.  Genitourinary: Negative for flank pain.  Musculoskeletal: Negative for back pain and neck pain.  Skin: Negative for rash.  Neurological: Negative for weakness and headaches.  Hematological: Does not bruise/bleed easily.  Psychiatric/Behavioral: Negative for confusion.     Physical Exam Updated Vital Signs BP (!) 156/104 (BP Location: Left Arm)   Pulse 65   Temp 97.5 F (36.4 C) (Oral)   Resp 20   SpO2 100%   Physical Exam  Constitutional: He appears well-developed and well-nourished. No distress.  HENT:  Mouth/Throat: Oropharynx is clear and moist.  Eyes: Conjunctivae are normal.  Neck: Neck supple. No tracheal deviation present.  Cardiovascular: Normal rate, regular rhythm, normal heart sounds and intact distal pulses.   Pulmonary/Chest: Effort normal and breath sounds normal. No accessory muscle usage. No respiratory distress.  Port right chest without sign of infection  Abdominal: Soft. Bowel sounds are normal. He exhibits no distension. There is no tenderness.    Musculoskeletal: He exhibits no edema.  Neurological: He is alert.  Skin: Skin is warm and dry. He is not diaphoretic.  Psychiatric: He has a normal mood and affect.  Nursing note and vitals reviewed.    ED Treatments / Results  Labs (all labs ordered are listed, but only abnormal results are displayed) Results for orders placed or performed in visit on 11/18/15  CBC with Differential  Result Value Ref Range   WBC 27.6 (H) 4.0 - 10.3 10e3/uL   NEUT# 25.3 (H) 1.5 - 6.5 10e3/uL   HGB 11.7 (L) 13.0 - 17.1 g/dL   HCT 37.6 (L) 38.4 - 49.9 %   Platelets 318 140 - 400 10e3/uL   MCV 71.9 (L) 79.3 - 98.0 fL   MCH 22.3 (L) 27.2 - 33.4 pg   MCHC 31.1 (L) 32.0 - 36.0 g/dL   RBC 5.23 4.20 - 5.82 10e6/uL   RDW 16.3 (H) 11.0 - 14.6 %   lymph# 1.3 0.9 - 3.3 10e3/uL   MONO# 0.8 0.1 - 0.9 10e3/uL   Eosinophils Absolute 0.0 0.0 - 0.5 10e3/uL   Basophils Absolute 0.1 0.0 - 0.1 10e3/uL   NEUT% 91.8 (H) 39.0 - 75.0 %   LYMPH% 4.8 (L) 14.0 - 49.0 %   MONO% 3.0 0.0 - 14.0 %   EOS% 0.0 0.0 - 7.0 %   BASO% 0.4 0.0 - 2.0 %  Comprehensive metabolic panel  Result Value Ref Range   Sodium 136 136 - 145 mEq/L   Potassium 6.7 No visable hemolysis (HH) 3.5 - 5.1 mEq/L   Chloride 108 98 - 109 mEq/L   CO2 19 (L) 22 - 29 mEq/L   Glucose 203 (H) 70 - 140 mg/dl   BUN 30.3 (H) 7.0 - 26.0 mg/dL   Creatinine 2.1 (H) 0.7 - 1.3 mg/dL   Total Bilirubin <0.30 0.20 - 1.20 mg/dL   Alkaline Phosphatase 98 40 - 150 U/L   AST 13 5 - 34 U/L   ALT 13 0 - 55 U/L   Total Protein 7.5 6.4 - 8.3 g/dL   Albumin 3.8 3.5 - 5.0 g/dL   Calcium 10.0 8.4 - 10.4 mg/dL   Anion Gap 9 3 - 11 mEq/L   EGFR 40 (L) >90 ml/min/1.73 m2  UA Protein, Dipstick  Result Value Ref Range  Protein, ur 100 Negative- <30 mg/dL  Potassium  Result Value Ref Range   Potassium 6.7 (HH) 3.5 - 5.1 mEq/L    EKG  EKG Interpretation  Date/Time:  Tuesday November 18 2015 14:09:04 EDT Ventricular Rate:  82 PR Interval:    QRS Duration: 86 QT  Interval:  350 QTC Calculation: 409 R Axis:   -14 Text Interpretation:  Sinus rhythm `prominent t waves v3-v5 Confirmed by Ashok Cordia  MD, Lennette Bihari ([id number]) on 11/18/2015 2:53:48 PM       Radiology No results found.  Procedures Procedures (including critical care time)  Medications Ordered in ED Medications  dextrose 50 % solution 25 g (not administered)  sodium bicarbonate injection 50 mEq (not administered)  insulin aspart (novoLOG) injection 8 Units (not administered)  sodium polystyrene (KAYEXALATE) 15 GM/60ML suspension 15 g (not administered)  sodium chloride 0.9 % bolus 1,000 mL (not administered)  calcium gluconate 1 g in sodium chloride 0.9 % 100 mL IVPB (not administered)     Initial Impression / Assessment and Plan / ED Course  I have reviewed the triage vital signs and the nursing notes.  Pertinent labs & imaging results that were available during my care of the patient were reviewed by me and considered in my medical decision making (see chart for details).  Clinical Course    Iv ns. Continuous pulse ox and monitor.   Ecg.   Labs from earlier today reviewed.  Calcium gluconate iv.  Hco3 iv, D50 iv, insulin iv.  Kayexalate po.   Hospitalist service consulted for admission.   CRITICAL CARE:  Acute kidney injury with severe hyperkalemia requiring emergent iv tx/Ca, admission Performed by: Mirna Mires Total critical care time: 35 minutes Critical care time was exclusive of separately billable procedures and treating other patients. Critical care was necessary to treat or prevent imminent or life-threatening deterioration. Critical care was time spent personally by me on the following activities: development of treatment plan with patient and/or surrogate as well as nursing, discussions with consultants, evaluation of patient's response to treatment, examination of patient, obtaining history from patient or surrogate, ordering and performing treatments and  interventions, ordering and review of laboratory studies, ordering and review of radiographic studies, pulse oximetry and re-evaluation of patient's condition.   Final Clinical Impressions(s) / ED Diagnoses   Final diagnoses:  None    New Prescriptions New Prescriptions   No medications on file        Lajean Saver, MD 11/18/15 1454

## 2015-11-18 NOTE — ED Notes (Signed)
Bed: CQ19 Expected date:  Expected time:  Means of arrival:  Comments: [facility]

## 2015-11-18 NOTE — H&P (Signed)
History and Physical  Keith Holloway IEP:[id number] DOB: 13-Oct-1960 DOA: 11/18/2015  Referring physician: Ashok Cordia PCP: [name], MD  Outpatient Specialists:  1. [facility] - oncology   Chief Complaint: sent from office with abnormal labs  HPI: Keith Holloway is a 55 y.o. male  with hx lung ca, presents from cancer center indicating they checked his potassium and it was high. - they told him it was very high. This is his second recent admission for hyperkalemia. Apparently he had been taken off losartan by his PCP because of hyperkalemia however the patient reports that he is continuing to take the medication at this time.  Patient indicates is getting chemo for his lung cancer, but they elected not to give today, due to high K.   His potassium was 6.7. There was no peaked T waves on the EKG today. However during the last admission he had significantly peaked T waves. Patient denies any recent change in meds, or new meds. Denies nsaid use. Indicates has been eating and drinking normally, and making normal amount urine. Notes hx similar symptoms in past, several months ago - pt indicates no cause found.  Patient indicates otherwise does not feel acutely ill or sick. He was also found to be dehydrated. The patient has a history of cocaine and other substance abuse. He had a positive cocaine test in August of this year. He is being admitted for further evaluation and management.  Review of Systems: All systems reviewed and apart from history of presenting illness, are negative.  Past Medical History:  Diagnosis Date  . Alcohol abuse   . Arthritis   . Borderline hypertension   . CHF (congestive heart failure) ([place])   . CKD (chronic kidney disease), stage III   . Cocaine abuse    last [employer] post[employer] for cocaine 09-30-2015  . Depression   . Dyspnea on effort   . History of acute renal failure    09-30-2015  . History of hyperkalemia    admission 09-30-2015 potassiom 8.0  with medications  currently 10-14-2015 down to 4.7 normal  . History of traumatic head injury    1987  w/ LOC   --- no residual  . Hyperlipidemia   . 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 Memory disorder 08/22/2013   alcohol abuse  . Migraine   . Nocturia   . 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 Pilonidal abscess    recurrent  . Pleural effusion, bilateral    per ct 10-09-2015  new small right effusion and left small to moderate effusion , increased  . Polyneuropathy in diabetes(357.2)   . Port-a-cath in place   . Pulmonary nodules/lesions, multiple    pt has stage 4 lung cancer  . Tobacco abuse counseling 10/09/2014  . Type 2 diabetes mellitus with insulin therapy ([place])   . Wears dentures    upper   Past Surgical History:  Procedure Laterality Date  . CATARACT EXTRACTION, BILATERAL  08/2012  . PILONIDAL CYST EXCISION N/A 10/17/2015   Procedure: EXCISION PILONIDAL DISEASE;  Surgeon: Leighton Ruff, MD;  Location: [facility];  Service: General;  Laterality: N/A;  . PORTACATH PLACEMENT  01/30/2015  . RETINAL DETACHMENT SURGERY Bilateral 04/2012   Social History:  reports that he quit smoking about 6 weeks ago. His smoking use included Cigarettes. He has a 8.00 pack-year smoking history. He has never used smokeless tobacco. He reports that he drinks alcohol. He reports that he uses drugs, including Cocaine.   No Known Allergies  Family History  Problem Relation Age of Onset  . Alzheimer's disease Brother   . Cancer Father   . Hypertension Father   . Heart attack Father   . Hypertension Mother   . Gout Mother   . Diabetes Sister   . Hypertension Sister   . Hypertension  Sister   . Hypertension Sister    Prior to Admission medications   Medication Sig Start Date End Date Taking? Authorizing Provider  acetaminophen (TYLENOL) 325 MG tablet Take 2 tablets (650 mg total) by mouth every 6 (six) hours as needed for mild pain (or Fever >/= 101). 10/02/15  Yes Lavina Hamman, MD  aspirin EC 81 MG tablet Take 1 tablet by mouth every morning.    Yes Historical Provider, MD  Cholecalciferol (VITAMIN D-3) 1000 units CAPS Take 1,000 Units by mouth daily.   Yes Historical Provider, MD  dexamethasone (DECADRON) 4 MG tablet 2 TABLETS BY MOUTH TWICE A DAY THE DAY BEFORE, DAY OF AND DAY AFTER THE CHEMOTHERAPY EVERY 3 WEEKS 08/12/15  Yes Curt Bears, MD  Ferrous Sulfate (IRON) 325 (65 Fe) MG TABS Take 1 tablet by mouth 2 (two) times daily.    Yes Historical Provider, MD  HYDROcodone-acetaminophen (NORCO/VICODIN) 5-325 MG tablet Take 2 tablets by mouth every 6 (six) hours as needed. Patient taking differently: Take 1-2 tablets by mouth every 6 (six) hours as needed.  03/22/98  Yes Leighton Ruff, MD  ibuprofen (ADVIL,MOTRIN) 200 MG tablet Take 400 mg by mouth every 6 (six) hours as needed for moderate pain.   Yes Historical Provider, MD  insulin NPH-regular Human (NOVOLIN 70/30) (70-30) 100 UNIT/ML injection Inject 35 Units into the skin 2 (two) times daily with a meal. Patient taking differently: Inject 20 Units into the skin 2 (two) times daily with a meal. ONLY IF CBG IS OVER 200. 01/23/14  Yes Deepak Advani, MD  lidocaine-prilocaine (EMLA) cream Apply 1 application topically as needed. 02/03/15  Yes Curt Bears, MD  losartan-hydrochlorothiazide (HYZAAR) 100-25 MG tablet Take 1 tablet by mouth daily.   Yes Historical Provider, MD  nicotine (NICODERM CQ - DOSED IN MG/24 HOURS) 14 mg/24hr patch Place 14 mg onto the skin daily.   Yes Historical Provider, MD  ondansetron (ZOFRAN) 8 MG tablet TAKE 1 TABLET EVERY 8 HOURS AS NEEDED FOR NAUSEA AND VOMITING 06/09/15  Yes Curt Bears,  MD  simvastatin (ZOCOR) 40 MG tablet Take 1 tablet (40 mg total) by mouth daily. Patient taking differently: Take 40 mg by mouth every morning.  01/23/14  Yes Deepak Advani, MD  temazepam (RESTORIL) 30 MG capsule TAKE ONE CAPSULE AT BEDTIME Patient taking differently: TAKE ONE CAPSULE AT BEDTIME--  PRN 06/19/15  Yes Curt Bears, MD  vitamin C (ASCORBIC ACID) 500 MG tablet Take 500 mg by mouth 2 (two) times daily.    Yes Historical Provider, MD  prochlorperazine (COMPAZINE) 10 MG tablet TAKE 1 TABLET (10 MG TOTAL) BY MOUTH EVERY 6 (SIX) HOURS AS NEEDED FOR NAUSEA OR VOMITING. Patient not taking: Reported on 11/18/2015 04/24/15   Curt Bears, MD   Physical Exam: Vitals:   11/18/15 1301  BP: (!) 156/104  Pulse: 65  Resp: 20  Temp: 97.5 F (36.4 C)  TempSrc: Oral  SpO2: 100%    Constitutional: He appears well-developed and well-nourished. No distress.  HENT: Mouth/Throat: Oropharynx is clear and Very dry.  Eyes: Conjunctivae are normal.  Neck: Neck supple. No tracheal deviation present.  Cardiovascular: Normal rate, regular rhythm, normal heart sounds and intact distal pulses.   Pulmonary/Chest: Effort normal and breath sounds normal. No accessory muscle usage. No respiratory distress.  Port right chest without sign of infection  Abdominal: Soft. Bowel sounds are normal. He exhibits no distension. There is no tenderness.  Musculoskeletal: He exhibits no edema.  Neurological: He is alert.  Skin: Skin is warm and dry. He is not diaphoretic.  Psychiatric: He has a normal mood and affect.  Labs on Admission:  Basic Metabolic Panel:  Recent Labs Lab 11/11/15 1457 11/18/15 1036 11/18/15 1154  NA 141 136  --   K 5.1 6.7 No visable hemolysis* 6.7*  CO2 24 19*  --   GLUCOSE 150* 203*  --   BUN 19.5 30.3*  --   CREATININE 1.3 2.1*  --   CALCIUM 9.1 10.0  --    Liver Function Tests:  Recent Labs Lab 11/11/15 1457 11/18/15 1036  AST 15 13  ALT 11 13  ALKPHOS 130 98    BILITOT <0.30 <0.30  PROT 7.0 7.5  ALBUMIN 3.4* 3.8   No results for input(s): LIPASE, AMYLASE in the last 168 hours. No results for input(s): AMMONIA in the last 168 hours. CBC:  Recent Labs Lab 11/11/15 1457 11/18/15 1036  WBC 17.8* 27.6*  NEUTROABS 14.9* 25.3*  HGB 11.3* 11.7*  HCT 36.5* 37.6*  MCV 72.0* 71.9*  PLT 247 318   Cardiac Enzymes: No results for input(s): CKTOTAL, CKMB, CKMBINDEX, TROPONINI in the last 168 hours.  BNP (last 3 results) No results for input(s): PROBNP in the last 8760 hours. CBG: No results for input(s): GLUCAP in the last 168 hours.  Radiological Exams on Admission: No results found.  EKG: Independently reviewed. No peaked T waves seen  Assessment/Plan Principal Problem:   Hyperkalemia Active Problems:   Diabetes mellitus with ophthalmic complication (HCC)   Alcohol abuse   Essential hypertension, benign   Tobacco abuse counseling   Chronic renal insufficiency   Dehydration   Acute kidney injury ([place])   Non-small cell carcinoma of left lung, stage 4 (HCC)   CHF (congestive heart failure) (HCC)   Cocaine abuse   1. Hyperkalemia-likely secondary to tumor lysis syndrome and ongoing use of losartan. He is also dehydrated and has chronic kidney disease which also may be contributing to this phenomenon. He is being immediately treated in the emergency department with calcium, bicarbonate, insulin and dextrose. IV fluid hydration has been ordered as he is significantly clinically dehydrated today. 2. Acute on chronic kidney injury-likely prerenal as he is dehydrated. IV fluid hydration has been ordered. Follow electrolytes with repeat BMP in the morning. 3. Congestive heart failure currently appears compensated-monitor fluids monitor intake and output and daily weights closely with this current IV hydration orders. 4. Non-small cell carcinoma of the lung, stage IV-currently being treated with oncology with chemotherapy. He was supposed to  receive chemotherapy today but it was held because of the hyperkalemia. 5. History of substance abuse and recent positive cocaine test-ordered a urine drug screen to be done rapidly. 6. Tobacco use-ordered for nicotine patch and continue ongoing counseling at bedside. 7. Diabetes mellitus-holding NPH 70/30 insulin secondary to acute renal  insufficiency and high risk for hypoglycemia, sliding scale insulin coverage ordered and low-dose prandial insulin ordered. Monitor blood glucose closely. 8. Essential hypertension-holding losartan as noted above, hydralazine ordered for elevated blood pressure readings.    DVT Prophylaxis: lovenox, SCD Code Status: full but no prolonged resuscitation  Family Communication: Disposition Plan: Home when medically stabilized   Irwin Brakeman, MD [facility] Pager [phone]  If 7PM-7AM, please contact night-coverage [email or web address] Password [email or web address] 11/18/2015, 2:34 PM

## 2015-11-18 NOTE — Telephone Encounter (Signed)
Left message for patient re next appointments for 10/11. Appointments this week delayed to next week per 10/3 los. Schedule mailed.

## 2015-11-18 NOTE — ED Notes (Signed)
Pt being sent by [facility] d/t potassium 6.7(repeated).  C/o emesis x 2 days and constipation x "several days."  Hx of lung CA.  Last chemo 9/13.

## 2015-11-18 NOTE — Consult Note (Signed)
[facility] Nurse wound consult note Reason for Consult: Surgically incised infectious abscess vs cyst performed several weeks ago. Patient is followed by Dr. Joyice Faster.  Next scheduled appointment with Dr. Marcello Moores is not until October 16th.  HRN was concerned about the presence of suture material in the wound, according to patient's wife. Wound type:Surgical, infectious Pressure Ulcer POA: No Measurement:1.4cm x 1cm x 1.5cm with undermining from 5-9 o'clock and with a sinus tract measuring 3.5cm at 6 o'clock Wound bed:red, moist Drainage (amount, consistency, odor) serous to light yellow on old dressing Periwound:Intact, dry Dressing procedure/placement/frequency: I will provide Nursing with guidance via the Orders for once daily filling of the defect with a calcium alginate dressing.  Patient and his wife would like to see Dr. Marcello Moores or her agent during his admission.  If you agree, please order/contact Dr. Marcello Moores at [facility]. [facility] nursing team will not follow, but will remain available to this patient, the nursing and medical teams.  Please re-consult if needed. Thanks, Maudie Flakes, MSN, RN, [place], Arther Abbott  Pager# [phone]

## 2015-11-19 DIAGNOSIS — E875 Hyperkalemia: Secondary | ICD-10-CM | POA: Diagnosis not present

## 2015-11-19 DIAGNOSIS — N179 Acute kidney failure, unspecified: Secondary | ICD-10-CM

## 2015-11-19 DIAGNOSIS — Z794 Long term (current) use of insulin: Secondary | ICD-10-CM

## 2015-11-19 DIAGNOSIS — E860 Dehydration: Secondary | ICD-10-CM

## 2015-11-19 DIAGNOSIS — N182 Chronic kidney disease, stage 2 (mild): Secondary | ICD-10-CM

## 2015-11-19 DIAGNOSIS — E1139 Type 2 diabetes mellitus with other diabetic ophthalmic complication: Secondary | ICD-10-CM

## 2015-11-19 DIAGNOSIS — C3492 Malignant neoplasm of unspecified part of left bronchus or lung: Secondary | ICD-10-CM

## 2015-11-19 DIAGNOSIS — I10 Essential (primary) hypertension: Secondary | ICD-10-CM

## 2015-11-19 LAB — COMPREHENSIVE METABOLIC PANEL
ALBUMIN: 3.2 g/dL — AB (ref 3.5–5.0)
ALT: 12 U/L — ABNORMAL LOW (ref 17–63)
ANION GAP: 4 — AB (ref 5–15)
AST: 14 U/L — AB (ref 15–41)
Alkaline Phosphatase: 59 U/L (ref 38–126)
BILIRUBIN TOTAL: 0.5 mg/dL (ref 0.3–1.2)
BUN: 25 mg/dL — AB (ref 6–20)
CHLORIDE: 111 mmol/L (ref 101–111)
CO2: 24 mmol/L (ref 22–32)
Calcium: 8.8 mg/dL — ABNORMAL LOW (ref 8.9–10.3)
Creatinine, Ser: 1.61 mg/dL — ABNORMAL HIGH (ref 0.61–1.24)
GFR calc Af Amer: 54 mL/min — ABNORMAL LOW (ref >60)
GFR, EST NON AFRICAN AMERICAN: 47 mL/min — AB (ref >60)
Glucose, Bld: 168 mg/dL — ABNORMAL HIGH (ref 65–99)
POTASSIUM: 4.4 mmol/L (ref 3.5–5.1)
Sodium: 139 mmol/L (ref 135–145)
TOTAL PROTEIN: 5.7 g/dL — AB (ref 6.5–8.1)

## 2015-11-19 LAB — CBC
HEMATOCRIT: 28.9 % — AB (ref 39.0–52.0)
HEMOGLOBIN: 9.7 g/dL — AB (ref 13.0–17.0)
MCH: 22.9 pg — ABNORMAL LOW (ref 26.0–34.0)
MCHC: 33.6 g/dL (ref 30.0–36.0)
MCV: 68.3 fL — AB (ref 78.0–100.0)
Platelets: 223 10*3/uL (ref 150–400)
RBC: 4.23 MIL/uL (ref 4.22–5.81)
RDW: 16 % — AB (ref 11.5–15.5)
WBC: 18.2 10*3/uL — AB (ref 4.0–10.5)

## 2015-11-19 LAB — GLUCOSE, CAPILLARY
GLUCOSE-CAPILLARY: 186 mg/dL — AB (ref 65–99)
Glucose-Capillary: 151 mg/dL — ABNORMAL HIGH (ref 65–99)

## 2015-11-19 MED ORDER — DOCUSATE SODIUM 100 MG PO CAPS: 100.0000 mg | ORAL_CAPSULE | Freq: Two times a day (BID) | ORAL | 0 refills | Status: DC

## 2015-11-19 MED ORDER — HEPARIN SOD (PORK) LOCK FLUSH 100 UNIT/ML IV SOLN
500.0000 [IU] | INTRAVENOUS | Status: AC | PRN
  Administered 2015-11-19: 500 [IU]

## 2015-11-19 MED ORDER — AMLODIPINE BESYLATE 10 MG PO TABS: 10.0000 mg | ORAL_TABLET | Freq: Every day | ORAL | 1 refills | Status: DC

## 2015-11-19 NOTE — Discharge Summary (Signed)
Physician Discharge Summary  Keith Holloway PXT:[id number] DOB: [birth year] DOA: 11/18/2015  PCP: [name], MD  Admit date: 11/18/2015 Discharge date: 11/19/2015  Time spent: 30 minutes  Recommendations for Outpatient Follow-up:  1. Repeat BMET to follow electrolytes and renal function  2. Reassess volume status and BP; adjust antihypertensive regimen and initiate diuretics if needed and renal function remains stable.   Discharge Diagnoses:  Principal Problem:   Hyperkalemia Active Problems:   Diabetes mellitus with ophthalmic complication (HCC)   Alcohol abuse   Essential hypertension, benign   Tobacco abuse counseling   Chronic renal insufficiency   Dehydration   Acute kidney injury ([place])   Non-small cell carcinoma of left lung, stage 4 (HCC)   CHF (congestive heart failure) (HCC)   Cocaine abuse   Discharge Condition: stable and improved. Discharge home with instructions to follow medications as prescribed and to keep himself well hydrated. Follow up with PCP in 1 week  Diet recommendation: heart healthy diet and modified carb diet   Filed Weights   11/18/15 1725  Weight: 81 kg (178 lb 8 oz)    History of present illness:  As per H&P written by Dr. Wynetta Emery on 11/18/15 55 y.o. male with hx lung ca, presents from cancer center indicating they checked his potassium and it was high. - they told him it was very high. This is his second recent admission for hyperkalemia. Apparently he had been taken off losartan by his PCP because of hyperkalemia however the patient reports that he is continuing to take the medication at this time. Patient indicates is getting chemo for his lung cancer, but they elected not to give today, due to high K.  His potassium was 6.7. There was no peaked T waves on the EKG today. However during the last admission he had significantly peaked T waves. Patient denies any recent change in meds, or new meds. Denies nsaid use. Indicates has been eating and  drinking normally, and making normal amount urine. Notes hx similar symptoms in past, several months ago - pt indicates no cause found. Patient indicates otherwise does not feel acutely ill or sick. He was also found to be dehydrated. The patient has a history of cocaine and other substance abuse. He had a positive cocaine test in August of this year. He is being admitted for further evaluation and management.  Hospital Course:  1-hyperkalemia: secondary to continue use of losartan; along with presumed tumor lysis syndrome with chemotherapy. -patient received IV calcium, bicarbonate and insulin in ED -electrolytes abnormalities resolved with good hydration and stopping losartan use -advise to follow up with PCP in 1 week to have repeat blood work -instructed to maintain excellent hydration   2-acute on chronic kidney injury: stage 2 at baseline  -resolved and back to baseline at discharge -appears to be pre-renal in nature -patient dehydrated and has continue using nephrotoxic agents -currently losartan and HCTZ has been discontinued -patient advise to follow good hydration   3-chronic diastolic heart failure  -compensated -advise to follow low sodium diet and to check daily weights  -HCTZ will be discontinue due to AKI and dehydration -reassessment of volume status at follow up need it   4-Non-small cell carcinoma of the lung, stage IV-currently being treated with oncology with chemotherapy.  -continue outpatient follow up and treatment by oncology service   5-tobacco abuse disorder: cessation counseling provided  -nicotine patch use while inpatient -patient encourage to use it after discharge; but he had some reservations   6-diabetes  mellitus type 2: chronically on insulin  -will continue 70/30 -advise to follow low carb diet   7-essential HTN: -will discharge on amlodipine  -advise to follow low sodium diet   Procedures:  See below for x-ray reports    Consultations:  Wound care service  Oncology service   Discharge Exam: Vitals:   11/19/15 0544 11/19/15 1300  BP: 140/84 135/79  Pulse: 81 78  Resp: 20 18  Temp: 98 F (36.7 C) 98.7 F (37.1 C)    General: afebrile, denies CP and SOB. Eating and drinking properly w/o any difficulties  Cardiovascular: S1 and S2, no rubs, no gallops Respiratory: CTA bilaterally Abd: with clean dressings in place, no distension, positive BS Extremities: no edema, no cyanosis, no clubbing   Discharge Instructions   Discharge Instructions    Diet - low sodium heart healthy    Complete by:  As directed    Discharge instructions    Complete by:  As directed    Keep yourself well hydrated  Take medications as prescribed  Please follow up with PCP in 1 week Continue follow up with oncology service as previously instructed  Follow a heart healthy diet  Stop Losartan/HCTZ combo pill     Current Discharge Medication List    START taking these medications   Details  amLODipine (NORVASC) 10 MG tablet Take 1 tablet (10 mg total) by mouth daily. Qty: 30 tablet, Refills: 1    docusate sodium (COLACE) 100 MG capsule Take 1 capsule (100 mg total) by mouth 2 (two) times daily. Qty: 40 capsule, Refills: 0      CONTINUE these medications which have NOT CHANGED   Details  acetaminophen (TYLENOL) 325 MG tablet Take 2 tablets (650 mg total) by mouth every 6 (six) hours as needed for mild pain (or Fever >/= 101). Qty: 30 tablet, Refills: 0    aspirin EC 81 MG tablet Take 1 tablet by mouth every morning.     Cholecalciferol (VITAMIN D-3) 1000 units CAPS Take 1,000 Units by mouth daily.    dexamethasone (DECADRON) 4 MG tablet 2 TABLETS BY MOUTH TWICE A DAY THE DAY BEFORE, DAY OF AND DAY AFTER THE CHEMOTHERAPY EVERY 3 WEEKS Qty: 40 tablet, Refills: 1    Ferrous Sulfate (IRON) 325 (65 Fe) MG TABS Take 1 tablet by mouth 2 (two) times daily.     HYDROcodone-acetaminophen (NORCO/VICODIN) 5-325 MG  tablet Take 2 tablets by mouth every 6 (six) hours as needed. Qty: 30 tablet, Refills: 0   Associated Diagnoses: Non-small cell carcinoma of lung, stage 4, left (HCC)    ibuprofen (ADVIL,MOTRIN) 200 MG tablet Take 400 mg by mouth every 6 (six) hours as needed for moderate pain.    insulin NPH-regular Human (NOVOLIN 70/30) (70-30) 100 UNIT/ML injection Inject 35 Units into the skin 2 (two) times daily with a meal. Qty: 10 mL, Refills: 11   Associated Diagnoses: Other specified diabetes mellitus without complications    lidocaine-prilocaine (EMLA) cream Apply 1 application topically as needed. Qty: 30 g, Refills: 0    nicotine (NICODERM CQ - DOSED IN MG/24 HOURS) 14 mg/24hr patch Place 14 mg onto the skin daily.    ondansetron (ZOFRAN) 8 MG tablet TAKE 1 TABLET EVERY 8 HOURS AS NEEDED FOR NAUSEA AND VOMITING Qty: 20 tablet, Refills: 1   Associated Diagnoses: Non-small cell carcinoma of lung, stage 4, left (HCC)    simvastatin (ZOCOR) 40 MG tablet Take 1 tablet (40 mg total) by mouth daily. Qty: 30 tablet,  Refills: 3   Associated Diagnoses: Hyperlipidemia    temazepam (RESTORIL) 30 MG capsule TAKE ONE CAPSULE AT BEDTIME Qty: 30 capsule, Refills: 0    vitamin C (ASCORBIC ACID) 500 MG tablet Take 500 mg by mouth 2 (two) times daily.     prochlorperazine (COMPAZINE) 10 MG tablet TAKE 1 TABLET (10 MG TOTAL) BY MOUTH EVERY 6 (SIX) HOURS AS NEEDED FOR NAUSEA OR VOMITING. Qty: 30 tablet, Refills: 0   Associated Diagnoses: Non-small cell carcinoma of lung, stage 4, left (HCC)      STOP taking these medications     losartan-hydrochlorothiazide (HYZAAR) 100-25 MG tablet        No Known Allergies Follow-up Information    [name], MD. Schedule an appointment as soon as possible for a visit in 1 week(s).   Specialty:  Internal Medicine Contact information: [place] [address] [phone]           The results of significant diagnostics from this  hospitalization (including imaging, microbiology, ancillary and laboratory) are listed below for reference.    Significant Diagnostic Studies: No results found.  Microbiology: Recent Results (from the past 240 hour(s))  MRSA PCR Screening     Status: None   Collection Time: 11/18/15  3:55 PM  Result Value Ref Range Status   MRSA by PCR NEGATIVE NEGATIVE Final    Comment:        The GeneXpert MRSA Assay (FDA approved for NASAL specimens only), is one component of a comprehensive MRSA colonization surveillance program. It is not intended to diagnose MRSA infection nor to guide or monitor treatment for MRSA infections.      Labs: Basic Metabolic Panel:  Recent Labs Lab 11/18/15 1036 11/18/15 1154 11/18/15 1710 11/19/15 0448  NA 136  --   --  139  K 6.7 No visable hemolysis* 6.7*  --  4.4  CL  --   --   --  111  CO2 19*  --   --  24  GLUCOSE 203*  --   --  168*  BUN 30.3*  --   --  25*  CREATININE 2.1*  --  1.83* 1.61*  CALCIUM 10.0  --   --  8.8*  MG  --   --  1.5*  --   PHOS  --   --  3.3  --    Liver Function Tests:  Recent Labs Lab 11/18/15 1036 11/19/15 0448  AST 13 14*  ALT 13 12*  ALKPHOS 98 59  BILITOT <0.30 0.5  PROT 7.5 5.7*  ALBUMIN 3.8 3.2*   CBC:  Recent Labs Lab 11/18/15 1036 11/18/15 1710 11/19/15 0448  WBC 27.6* 26.8* 18.2*  NEUTROABS 25.3*  --   --   HGB 11.7* 10.9* 9.7*  HCT 37.6* 32.7* 28.9*  MCV 71.9* 68.6* 68.3*  PLT 318 273 223   CBG:  Recent Labs Lab 11/18/15 1631 11/18/15 2214 11/19/15 0738 11/19/15 1127  GLUCAP 184* 276* 151* 186*    Signed:  Barton Dubois MD.  [facility] 11/19/2015, 4:04 PM

## 2015-11-20 ENCOUNTER — Ambulatory Visit: Payer: Medicare Other

## 2015-11-25 ENCOUNTER — Ambulatory Visit: Payer: Medicare Other | Admitting: Internal Medicine

## 2015-11-25 ENCOUNTER — Other Ambulatory Visit: Payer: Medicare Other

## 2015-11-25 ENCOUNTER — Ambulatory Visit: Payer: Medicare Other

## 2015-11-26 ENCOUNTER — Other Ambulatory Visit: Payer: Self-pay | Admitting: Internal Medicine

## 2015-11-26 ENCOUNTER — Other Ambulatory Visit: Payer: Self-pay | Admitting: Medical Oncology

## 2015-11-26 ENCOUNTER — Other Ambulatory Visit (HOSPITAL_BASED_OUTPATIENT_CLINIC_OR_DEPARTMENT_OTHER): Payer: Medicare Other

## 2015-11-26 ENCOUNTER — Other Ambulatory Visit: Payer: Self-pay

## 2015-11-26 ENCOUNTER — Ambulatory Visit (HOSPITAL_BASED_OUTPATIENT_CLINIC_OR_DEPARTMENT_OTHER): Payer: Medicare Other

## 2015-11-26 ENCOUNTER — Encounter: Payer: Self-pay | Admitting: Internal Medicine

## 2015-11-26 VITALS — BP 154/93 | HR 80 | Temp 97.7°F | Resp 18

## 2015-11-26 DIAGNOSIS — C3492 Malignant neoplasm of unspecified part of left bronchus or lung: Secondary | ICD-10-CM

## 2015-11-26 DIAGNOSIS — R739 Hyperglycemia, unspecified: Secondary | ICD-10-CM

## 2015-11-26 DIAGNOSIS — Z5112 Encounter for antineoplastic immunotherapy: Secondary | ICD-10-CM | POA: Diagnosis present

## 2015-11-26 DIAGNOSIS — C3432 Malignant neoplasm of lower lobe, left bronchus or lung: Secondary | ICD-10-CM

## 2015-11-26 DIAGNOSIS — Z5111 Encounter for antineoplastic chemotherapy: Secondary | ICD-10-CM

## 2015-11-26 DIAGNOSIS — Z794 Long term (current) use of insulin: Principal | ICD-10-CM

## 2015-11-26 DIAGNOSIS — C787 Secondary malignant neoplasm of liver and intrahepatic bile duct: Secondary | ICD-10-CM | POA: Diagnosis not present

## 2015-11-26 DIAGNOSIS — E1139 Type 2 diabetes mellitus with other diabetic ophthalmic complication: Secondary | ICD-10-CM

## 2015-11-26 DIAGNOSIS — N182 Chronic kidney disease, stage 2 (mild): Secondary | ICD-10-CM

## 2015-11-26 DIAGNOSIS — E119 Type 2 diabetes mellitus without complications: Secondary | ICD-10-CM

## 2015-11-26 HISTORY — DX: Hyperglycemia, unspecified: R73.9

## 2015-11-26 LAB — COMPREHENSIVE METABOLIC PANEL
ALBUMIN: 3.2 g/dL — AB (ref 3.5–5.0)
ALK PHOS: 78 U/L (ref 40–150)
ALT: 14 U/L (ref 0–55)
AST: 12 U/L (ref 5–34)
Anion Gap: 9 mEq/L (ref 3–11)
BUN: 25.9 mg/dL (ref 7.0–26.0)
CO2: 19 meq/L — AB (ref 22–29)
CREATININE: 1.4 mg/dL — AB (ref 0.7–1.3)
Calcium: 9.2 mg/dL (ref 8.4–10.4)
Chloride: 109 mEq/L (ref 98–109)
EGFR: 65 mL/min/{1.73_m2} — AB (ref >90)
GLUCOSE: 395 mg/dL — AB (ref 70–140)
Potassium: 4.9 mEq/L (ref 3.5–5.1)
SODIUM: 137 meq/L (ref 136–145)
TOTAL PROTEIN: 6.8 g/dL (ref 6.4–8.3)

## 2015-11-26 LAB — CBC WITH DIFFERENTIAL/PLATELET
BASO%: 0.1 % (ref 0.0–2.0)
Basophils Absolute: 0 10*3/uL (ref 0.0–0.1)
EOS%: 0 % (ref 0.0–7.0)
Eosinophils Absolute: 0 10*3/uL (ref 0.0–0.5)
HCT: 31.9 % — ABNORMAL LOW (ref 38.4–49.9)
HEMOGLOBIN: 10.8 g/dL — AB (ref 13.0–17.1)
LYMPH#: 1.1 10*3/uL (ref 0.9–3.3)
LYMPH%: 7.1 % — AB (ref 14.0–49.0)
MCH: 22.7 pg — ABNORMAL LOW (ref 27.2–33.4)
MCHC: 33.9 g/dL (ref 32.0–36.0)
MCV: 67 fL — AB (ref 79.3–98.0)
MONO#: 0.9 10*3/uL (ref 0.1–0.9)
MONO%: 5.8 % (ref 0.0–14.0)
NEUT#: 13.5 10*3/uL — ABNORMAL HIGH (ref 1.5–6.5)
NEUT%: 87 % — AB (ref 39.0–75.0)
PLATELETS: 208 10*3/uL (ref 140–400)
RBC: 4.76 10*6/uL (ref 4.20–5.82)
RDW: 16.3 % — AB (ref 11.0–14.6)
WBC: 15.5 10*3/uL — AB (ref 4.0–10.3)
nRBC: 0 % (ref 0–0)

## 2015-11-26 LAB — UA PROTEIN, DIPSTICK - CHCC: PROTEIN: 100 mg/dL

## 2015-11-26 MED ORDER — SODIUM CHLORIDE 0.9 % IV SOLN
Freq: Once | INTRAVENOUS | Status: AC
  Administered 2015-11-26: 15:00:00 via INTRAVENOUS

## 2015-11-26 MED ORDER — HEPARIN SOD (PORK) LOCK FLUSH 100 UNIT/ML IV SOLN
500.0000 [IU] | Freq: Once | INTRAVENOUS | Status: AC | PRN
  Administered 2015-11-26: 500 [IU]
  Filled 2015-11-26: qty 5

## 2015-11-26 MED ORDER — ACETAMINOPHEN 325 MG PO TABS
ORAL_TABLET | ORAL | Status: AC
  Filled 2015-11-26: qty 2

## 2015-11-26 MED ORDER — INSULIN REGULAR HUMAN 100 UNIT/ML IJ SOLN
15.0000 [IU] | Freq: Once | INTRAMUSCULAR | Status: AC
  Administered 2015-11-26: 15 [IU] via SUBCUTANEOUS
  Filled 2015-11-26: qty 0.15

## 2015-11-26 MED ORDER — DEXAMETHASONE SODIUM PHOSPHATE 100 MG/10ML IJ SOLN
10.0000 mg | Freq: Once | INTRAMUSCULAR | Status: AC
  Administered 2015-11-26: 10 mg via INTRAVENOUS
  Filled 2015-11-26: qty 1

## 2015-11-26 MED ORDER — DIPHENHYDRAMINE HCL 50 MG/ML IJ SOLN
INTRAMUSCULAR | Status: AC
  Filled 2015-11-26: qty 1

## 2015-11-26 MED ORDER — DIPHENHYDRAMINE HCL 50 MG/ML IJ SOLN
50.0000 mg | Freq: Once | INTRAMUSCULAR | Status: AC
  Administered 2015-11-26: 50 mg via INTRAVENOUS

## 2015-11-26 MED ORDER — ACETAMINOPHEN 325 MG PO TABS
650.0000 mg | ORAL_TABLET | Freq: Once | ORAL | Status: AC
Start: 2015-11-26 — End: 2015-11-26
  Administered 2015-11-26: 650 mg via ORAL

## 2015-11-26 MED ORDER — SODIUM CHLORIDE 0.9 % IV SOLN
10.0000 mg/kg | Freq: Once | INTRAVENOUS | Status: AC
  Administered 2015-11-26: 800 mg via INTRAVENOUS
  Filled 2015-11-26: qty 50

## 2015-11-26 MED ORDER — SODIUM CHLORIDE 0.9 % IV SOLN
75.0000 mg/m2 | Freq: Once | INTRAVENOUS | Status: AC
  Administered 2015-11-26: 150 mg via INTRAVENOUS
  Filled 2015-11-26: qty 15

## 2015-11-26 MED ORDER — SODIUM CHLORIDE 0.9% FLUSH
10.0000 mL | INTRAVENOUS | Status: DC | PRN
  Administered 2015-11-26: 10 mL
  Filled 2015-11-26: qty 10

## 2015-11-26 NOTE — Patient Instructions (Signed)
[facility] Discharge Instructions for Patients Receiving Chemotherapy  Today you received the following chemotherapy agents:  Taxotere and Cyramza  To help prevent nausea and vomiting after your treatment, we encourage you to take your nausea medication as ordered per MD.   If you develop nausea and vomiting that is not controlled by your nausea medication, call the clinic.   BELOW ARE SYMPTOMS THAT SHOULD BE REPORTED IMMEDIATELY:  *FEVER GREATER THAN 100.5 F  *CHILLS WITH OR WITHOUT FEVER  NAUSEA AND VOMITING THAT IS NOT CONTROLLED WITH YOUR NAUSEA MEDICATION  *UNUSUAL SHORTNESS OF BREATH  *UNUSUAL BRUISING OR BLEEDING  TENDERNESS IN MOUTH AND THROAT WITH OR WITHOUT PRESENCE OF ULCERS  *URINARY PROBLEMS  *BOWEL PROBLEMS  UNUSUAL RASH Items with * indicate a potential emergency and should be followed up as soon as possible.  Feel free to call the clinic you have any questions or concerns. The clinic phone number is [phone].  Please show the [facility] at check-in to the Emergency Department and triage nurse.

## 2015-11-26 NOTE — Progress Notes (Signed)
Ok to treat with Cyramza and Taxotere today with elevated blood pressure.  Dr. Julien Nordmann to enter order to be given of  15 units of Novolog for elevated glucose.

## 2015-11-27 ENCOUNTER — Ambulatory Visit: Payer: Medicare Other

## 2015-11-28 ENCOUNTER — Ambulatory Visit (HOSPITAL_BASED_OUTPATIENT_CLINIC_OR_DEPARTMENT_OTHER): Payer: Medicare Other

## 2015-11-28 VITALS — BP 150/88 | HR 91 | Temp 97.9°F | Resp 18

## 2015-11-28 DIAGNOSIS — C787 Secondary malignant neoplasm of liver and intrahepatic bile duct: Secondary | ICD-10-CM

## 2015-11-28 DIAGNOSIS — C3492 Malignant neoplasm of unspecified part of left bronchus or lung: Secondary | ICD-10-CM

## 2015-11-28 DIAGNOSIS — C3490 Malignant neoplasm of unspecified part of unspecified bronchus or lung: Secondary | ICD-10-CM

## 2015-11-28 DIAGNOSIS — C3432 Malignant neoplasm of lower lobe, left bronchus or lung: Secondary | ICD-10-CM

## 2015-11-28 MED ORDER — PEGFILGRASTIM INJECTION 6 MG/0.6ML ~~LOC~~
6.0000 mg | PREFILLED_SYRINGE | Freq: Once | SUBCUTANEOUS | Status: AC
  Administered 2015-11-28: 6 mg via SUBCUTANEOUS
  Filled 2015-11-28: qty 0.6

## 2015-12-02 ENCOUNTER — Ambulatory Visit: Payer: Medicare Other

## 2015-12-02 ENCOUNTER — Ambulatory Visit: Payer: Medicare Other | Admitting: Internal Medicine

## 2015-12-02 ENCOUNTER — Other Ambulatory Visit (HOSPITAL_BASED_OUTPATIENT_CLINIC_OR_DEPARTMENT_OTHER): Payer: Medicare Other

## 2015-12-02 DIAGNOSIS — C3432 Malignant neoplasm of lower lobe, left bronchus or lung: Secondary | ICD-10-CM

## 2015-12-02 DIAGNOSIS — Z5111 Encounter for antineoplastic chemotherapy: Secondary | ICD-10-CM

## 2015-12-02 DIAGNOSIS — N182 Chronic kidney disease, stage 2 (mild): Secondary | ICD-10-CM

## 2015-12-02 DIAGNOSIS — C3492 Malignant neoplasm of unspecified part of left bronchus or lung: Secondary | ICD-10-CM

## 2015-12-02 LAB — CBC WITH DIFFERENTIAL/PLATELET
BASO%: 0.9 % (ref 0.0–2.0)
Basophils Absolute: 0.1 10*3/uL (ref 0.0–0.1)
EOS ABS: 0.1 10*3/uL (ref 0.0–0.5)
EOS%: 1.1 % (ref 0.0–7.0)
HEMATOCRIT: 29.7 % — AB (ref 38.4–49.9)
HGB: 10 g/dL — ABNORMAL LOW (ref 13.0–17.1)
LYMPH%: 19.7 % (ref 14.0–49.0)
MCH: 22.4 pg — ABNORMAL LOW (ref 27.2–33.4)
MCHC: 33.7 g/dL (ref 32.0–36.0)
MCV: 66.6 fL — ABNORMAL LOW (ref 79.3–98.0)
MONO#: 0.5 10*3/uL (ref 0.1–0.9)
MONO%: 8.6 % (ref 0.0–14.0)
NEUT%: 69.7 % (ref 39.0–75.0)
NEUTROS ABS: 3.9 10*3/uL (ref 1.5–6.5)
PLATELETS: 87 10*3/uL — AB (ref 140–400)
RBC: 4.46 10*6/uL (ref 4.20–5.82)
RDW: 16.5 % — ABNORMAL HIGH (ref 11.0–14.6)
WBC: 5.6 10*3/uL (ref 4.0–10.3)
lymph#: 1.1 10*3/uL (ref 0.9–3.3)
nRBC: 0 % (ref 0–0)

## 2015-12-02 LAB — COMPREHENSIVE METABOLIC PANEL
ALBUMIN: 2.9 g/dL — AB (ref 3.5–5.0)
ALK PHOS: 113 U/L (ref 40–150)
ALT: 15 U/L (ref 0–55)
ANION GAP: 7 meq/L (ref 3–11)
AST: 17 U/L (ref 5–34)
BILIRUBIN TOTAL: 0.41 mg/dL (ref 0.20–1.20)
BUN: 20.1 mg/dL (ref 7.0–26.0)
CALCIUM: 8.8 mg/dL (ref 8.4–10.4)
CHLORIDE: 111 meq/L — AB (ref 98–109)
CO2: 22 mEq/L (ref 22–29)
CREATININE: 1.1 mg/dL (ref 0.7–1.3)
EGFR: 87 mL/min/{1.73_m2} — ABNORMAL LOW (ref >90)
Glucose: 266 mg/dl — ABNORMAL HIGH (ref 70–140)
Potassium: 4.5 mEq/L (ref 3.5–5.1)
Sodium: 141 mEq/L (ref 136–145)
Total Protein: 6.2 g/dL — ABNORMAL LOW (ref 6.4–8.3)

## 2015-12-09 ENCOUNTER — Other Ambulatory Visit (HOSPITAL_BASED_OUTPATIENT_CLINIC_OR_DEPARTMENT_OTHER): Payer: Medicare Other

## 2015-12-09 ENCOUNTER — Ambulatory Visit: Payer: Medicare Other | Admitting: Internal Medicine

## 2015-12-09 ENCOUNTER — Ambulatory Visit: Payer: Medicare Other

## 2015-12-09 DIAGNOSIS — Z79899 Other long term (current) drug therapy: Secondary | ICD-10-CM

## 2015-12-09 DIAGNOSIS — C3432 Malignant neoplasm of lower lobe, left bronchus or lung: Secondary | ICD-10-CM | POA: Diagnosis present

## 2015-12-09 DIAGNOSIS — C3492 Malignant neoplasm of unspecified part of left bronchus or lung: Secondary | ICD-10-CM

## 2015-12-09 DIAGNOSIS — N182 Chronic kidney disease, stage 2 (mild): Secondary | ICD-10-CM

## 2015-12-09 DIAGNOSIS — Z5111 Encounter for antineoplastic chemotherapy: Secondary | ICD-10-CM

## 2015-12-09 LAB — CBC WITH DIFFERENTIAL/PLATELET
BASO%: 0.2 % (ref 0.0–2.0)
BASOS ABS: 0 10*3/uL (ref 0.0–0.1)
EOS%: 0.2 % (ref 0.0–7.0)
Eosinophils Absolute: 0 10*3/uL (ref 0.0–0.5)
HCT: 27.3 % — ABNORMAL LOW (ref 38.4–49.9)
HGB: 9.1 g/dL — ABNORMAL LOW (ref 13.0–17.1)
LYMPH%: 10.7 % — AB (ref 14.0–49.0)
MCH: 22.5 pg — AB (ref 27.2–33.4)
MCHC: 33.3 g/dL (ref 32.0–36.0)
MCV: 67.4 fL — AB (ref 79.3–98.0)
MONO#: 1.3 10*3/uL — ABNORMAL HIGH (ref 0.1–0.9)
MONO%: 7.6 % (ref 0.0–14.0)
NEUT#: 13.9 10*3/uL — ABNORMAL HIGH (ref 1.5–6.5)
NEUT%: 81.3 % — AB (ref 39.0–75.0)
Platelets: 94 10*3/uL — ABNORMAL LOW (ref 140–400)
RBC: 4.05 10*6/uL — ABNORMAL LOW (ref 4.20–5.82)
RDW: 17.6 % — ABNORMAL HIGH (ref 11.0–14.6)
WBC: 17.1 10*3/uL — ABNORMAL HIGH (ref 4.0–10.3)
lymph#: 1.8 10*3/uL (ref 0.9–3.3)
nRBC: 0 % (ref 0–0)

## 2015-12-09 LAB — COMPREHENSIVE METABOLIC PANEL
ALBUMIN: 2.8 g/dL — AB (ref 3.5–5.0)
ALK PHOS: 171 U/L — AB (ref 40–150)
ALT: 22 U/L (ref 0–55)
AST: 21 U/L (ref 5–34)
Anion Gap: 6 mEq/L (ref 3–11)
BUN: 16.8 mg/dL (ref 7.0–26.0)
CALCIUM: 8.4 mg/dL (ref 8.4–10.4)
CO2: 25 mEq/L (ref 22–29)
Chloride: 112 mEq/L — ABNORMAL HIGH (ref 98–109)
Creatinine: 1.2 mg/dL (ref 0.7–1.3)
EGFR: 75 mL/min/{1.73_m2} — AB (ref >90)
GLUCOSE: 223 mg/dL — AB (ref 70–140)
POTASSIUM: 5.1 meq/L (ref 3.5–5.1)
SODIUM: 142 meq/L (ref 136–145)
Total Bilirubin: <0.22 mg/dL (ref 0.20–1.20)
Total Protein: 5.9 g/dL — ABNORMAL LOW (ref 6.4–8.3)

## 2015-12-09 LAB — UA PROTEIN, DIPSTICK - CHCC: PROTEIN: 100 mg/dL

## 2015-12-11 ENCOUNTER — Ambulatory Visit: Payer: Medicare Other

## 2015-12-16 ENCOUNTER — Ambulatory Visit (HOSPITAL_BASED_OUTPATIENT_CLINIC_OR_DEPARTMENT_OTHER): Payer: Medicare Other

## 2015-12-16 ENCOUNTER — Ambulatory Visit (HOSPITAL_BASED_OUTPATIENT_CLINIC_OR_DEPARTMENT_OTHER): Payer: Medicare Other | Admitting: Internal Medicine

## 2015-12-16 ENCOUNTER — Telehealth: Payer: Self-pay | Admitting: Internal Medicine

## 2015-12-16 ENCOUNTER — Other Ambulatory Visit (HOSPITAL_BASED_OUTPATIENT_CLINIC_OR_DEPARTMENT_OTHER): Payer: Medicare Other

## 2015-12-16 ENCOUNTER — Encounter: Payer: Self-pay | Admitting: Internal Medicine

## 2015-12-16 VITALS — BP 167/102 | HR 99 | Temp 98.3°F | Resp 16 | Ht 71.0 in | Wt 193.7 lb

## 2015-12-16 VITALS — BP 144/88 | HR 92 | Resp 16

## 2015-12-16 DIAGNOSIS — E119 Type 2 diabetes mellitus without complications: Secondary | ICD-10-CM | POA: Diagnosis not present

## 2015-12-16 DIAGNOSIS — I10 Essential (primary) hypertension: Secondary | ICD-10-CM | POA: Diagnosis not present

## 2015-12-16 DIAGNOSIS — Z5111 Encounter for antineoplastic chemotherapy: Secondary | ICD-10-CM | POA: Diagnosis not present

## 2015-12-16 DIAGNOSIS — C3432 Malignant neoplasm of lower lobe, left bronchus or lung: Secondary | ICD-10-CM

## 2015-12-16 DIAGNOSIS — E875 Hyperkalemia: Secondary | ICD-10-CM | POA: Diagnosis not present

## 2015-12-16 DIAGNOSIS — C3492 Malignant neoplasm of unspecified part of left bronchus or lung: Secondary | ICD-10-CM

## 2015-12-16 DIAGNOSIS — C787 Secondary malignant neoplasm of liver and intrahepatic bile duct: Secondary | ICD-10-CM

## 2015-12-16 DIAGNOSIS — N182 Chronic kidney disease, stage 2 (mild): Secondary | ICD-10-CM

## 2015-12-16 DIAGNOSIS — Z5112 Encounter for antineoplastic immunotherapy: Secondary | ICD-10-CM

## 2015-12-16 HISTORY — DX: Essential (primary) hypertension: I10

## 2015-12-16 LAB — COMPREHENSIVE METABOLIC PANEL
ALBUMIN: 3 g/dL — AB (ref 3.5–5.0)
ALK PHOS: 134 U/L (ref 40–150)
ALT: 15 U/L (ref 0–55)
AST: 12 U/L (ref 5–34)
Anion Gap: 9 mEq/L (ref 3–11)
BUN: 26 mg/dL (ref 7.0–26.0)
CHLORIDE: 105 meq/L (ref 98–109)
CO2: 22 mEq/L (ref 22–29)
Calcium: 9.1 mg/dL (ref 8.4–10.4)
Creatinine: 1.6 mg/dL — ABNORMAL HIGH (ref 0.7–1.3)
EGFR: 57 mL/min/{1.73_m2} — AB (ref >90)
GLUCOSE: 428 mg/dL — AB (ref 70–140)
POTASSIUM: 5.5 meq/L — AB (ref 3.5–5.1)
SODIUM: 137 meq/L (ref 136–145)
Total Bilirubin: 0.25 mg/dL (ref 0.20–1.20)
Total Protein: 6.7 g/dL (ref 6.4–8.3)

## 2015-12-16 LAB — CBC WITH DIFFERENTIAL/PLATELET
BASO%: 0.1 % (ref 0.0–2.0)
BASOS ABS: 0 10*3/uL (ref 0.0–0.1)
EOS%: 0 % (ref 0.0–7.0)
Eosinophils Absolute: 0 10*3/uL (ref 0.0–0.5)
HCT: 29.2 % — ABNORMAL LOW (ref 38.4–49.9)
HGB: 9.5 g/dL — ABNORMAL LOW (ref 13.0–17.1)
LYMPH#: 0.8 10*3/uL — AB (ref 0.9–3.3)
LYMPH%: 5.6 % — AB (ref 14.0–49.0)
MCH: 22 pg — AB (ref 27.2–33.4)
MCHC: 32.5 g/dL (ref 32.0–36.0)
MCV: 67.7 fL — AB (ref 79.3–98.0)
MONO#: 1 10*3/uL — ABNORMAL HIGH (ref 0.1–0.9)
MONO%: 6.9 % (ref 0.0–14.0)
NEUT#: 12.9 10*3/uL — ABNORMAL HIGH (ref 1.5–6.5)
NEUT%: 87.4 % — AB (ref 39.0–75.0)
Platelets: 183 10*3/uL (ref 140–400)
RBC: 4.31 10*6/uL (ref 4.20–5.82)
RDW: 17.8 % — ABNORMAL HIGH (ref 11.0–14.6)
WBC: 14.8 10*3/uL — ABNORMAL HIGH (ref 4.0–10.3)
nRBC: 0 % (ref 0–0)

## 2015-12-16 LAB — TECHNOLOGIST REVIEW

## 2015-12-16 MED ORDER — RAMUCIRUMAB CHEMO INJECTION 500 MG/50ML
10.0000 mg/kg | Freq: Once | INTRAVENOUS | Status: AC
  Administered 2015-12-16: 800 mg via INTRAVENOUS
  Filled 2015-12-16: qty 50

## 2015-12-16 MED ORDER — DEXAMETHASONE SODIUM PHOSPHATE 10 MG/ML IJ SOLN
INTRAMUSCULAR | Status: AC
  Filled 2015-12-16: qty 1

## 2015-12-16 MED ORDER — DIPHENHYDRAMINE HCL 50 MG/ML IJ SOLN
50.0000 mg | Freq: Once | INTRAMUSCULAR | Status: AC
  Administered 2015-12-16: 50 mg via INTRAVENOUS

## 2015-12-16 MED ORDER — HEPARIN SOD (PORK) LOCK FLUSH 100 UNIT/ML IV SOLN
500.0000 [IU] | Freq: Once | INTRAVENOUS | Status: DC | PRN
  Filled 2015-12-16: qty 5

## 2015-12-16 MED ORDER — ACETAMINOPHEN 325 MG PO TABS
ORAL_TABLET | ORAL | Status: AC
  Filled 2015-12-16: qty 2

## 2015-12-16 MED ORDER — SODIUM CHLORIDE 0.9% FLUSH
10.0000 mL | INTRAVENOUS | Status: DC | PRN
  Filled 2015-12-16: qty 10

## 2015-12-16 MED ORDER — CLONIDINE HCL 0.1 MG PO TABS
0.2000 mg | ORAL_TABLET | Freq: Once | ORAL | Status: DC
  Administered 2015-12-16: 0.2 mg via ORAL

## 2015-12-16 MED ORDER — SODIUM CHLORIDE 0.9 % IV SOLN
75.0000 mg/m2 | Freq: Once | INTRAVENOUS | Status: AC
  Administered 2015-12-16: 150 mg via INTRAVENOUS
  Filled 2015-12-16: qty 15

## 2015-12-16 MED ORDER — DEXAMETHASONE SODIUM PHOSPHATE 10 MG/ML IJ SOLN
10.0000 mg | Freq: Once | INTRAMUSCULAR | Status: AC
  Administered 2015-12-16: 10 mg via INTRAVENOUS

## 2015-12-16 MED ORDER — DIPHENHYDRAMINE HCL 50 MG/ML IJ SOLN
INTRAMUSCULAR | Status: AC
  Filled 2015-12-16: qty 1

## 2015-12-16 MED ORDER — SODIUM CHLORIDE 0.9 % IV SOLN
Freq: Once | INTRAVENOUS | Status: AC
  Administered 2015-12-16: 14:00:00 via INTRAVENOUS

## 2015-12-16 MED ORDER — DOCETAXEL CHEMO INJECTION 160 MG/16ML: 75.0000 mg/m2 | Freq: Once | INTRAVENOUS | Status: DC

## 2015-12-16 MED ORDER — ACETAMINOPHEN 325 MG PO TABS
650.0000 mg | ORAL_TABLET | Freq: Once | ORAL | Status: AC
  Administered 2015-12-16: 650 mg via ORAL

## 2015-12-16 MED ORDER — CLONIDINE HCL 0.1 MG PO TABS
ORAL_TABLET | ORAL | Status: AC
  Filled 2015-12-16: qty 2

## 2015-12-16 NOTE — Progress Notes (Signed)
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  PRIOR THERAPY:  1) Systemic chemotherapy with carboplatin for AUC of 5 given on day 1 and gemcitabine 1000 MG/M2 on days 1 and 8 every 3 weeks. Status post 6 cycles with partial response. 2) Nivolumab 240 MG IV every 2 weeks status post 4 cycles, last dose was given 03/18/2015 discontinued today secondary to disease progression.  CURRENT THERAPY: Systemic chemotherapy with docetaxel 75 MG/M2 and Cyramza 10 MG/KG every 3 weeks, first dose 04/08/2015. Status post 10 cycles.  CODE STATUS: Full code initially but no prolonged resuscitation.   INTERVAL HISTORY: Keith Holloway 55 y.o. male returns to the clinic today for follow-up visit accompanied by his daughter. The patient is feeling fine today with no specific complaints except for increasing cough and shortness breath with exertion started a week ago. He tolerated the last cycle of his treatment fairly well with no significant adverse effects. The patient denied having any significant chest pain or hemoptysis. He denied having any nausea or vomiting but has constipation. No significant fever or chills. He is here today for evaluation before starting cycle #11.  MEDICAL HISTORY: Past Medical History:  Diagnosis Date  . Acute hyperglycemia 11/26/2015  . Alcohol abuse   . Arthritis   . Borderline hypertension   . CHF (congestive heart failure) ([place])   . CKD (chronic kidney disease), stage III   . Cocaine abuse    last [employer] post[employer] for cocaine 09-30-2015  . Depression   . Dyspnea on effort   . Elevated blood sugar 11/26/2015  . History of acute renal failure    09-30-2015  . History of hyperkalemia    admission 09-30-2015 potassiom 8.0  with  medications currently 10-14-2015 down to 4.7 normal  . History of traumatic head injury    1987  w/ LOC   --- no residual  . Hyperlipidemia   . 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 Memory disorder 08/22/2013   alcohol abuse  . Migraine   . Nocturia   . Non-small cell carcinoma of lung, stage 4 [facility]) oncologist-  dr Julien Nordmann   w/ METs to liver dx 04/ 2016--  Stage IV (T2a, N3, M1b), squamous cell carcinoma of left lower lobe---  systemic chemotherapy every 3 weeks and currently due to evidence for disease progression has started immunotherapy  . Pilonidal abscess    recurrent  . Pleural effusion, bilateral    per ct 10-09-2015  new small right effusion and left small to moderate effusion , increased  . Polyneuropathy in diabetes(357.2)   . Port-a-cath in place   . Pulmonary nodules/lesions, multiple    pt has stage 4 lung cancer  . Tobacco abuse counseling 10/09/2014  . Type 2 diabetes mellitus with insulin therapy ([place])   . Wears dentures    upper    ALLERGIES:  has No Known Allergies.  MEDICATIONS:  Current Outpatient Prescriptions  Medication Sig Dispense Refill  . acetaminophen (TYLENOL) 325 MG tablet Take 2 tablets (650 mg total) by mouth every 6 (six) hours as needed for mild pain (or Fever >/= 101). 30 tablet 0  . amLODipine (NORVASC) 10 MG tablet Take 1 tablet (10 mg  total) by mouth daily. 30 tablet 1  . aspirin EC 81 MG tablet Take 1 tablet by mouth every morning.     . Cholecalciferol (VITAMIN D-3) 1000 units CAPS Take 1,000 Units by mouth daily.    Marland Kitchen dexamethasone (DECADRON) 4 MG tablet 2 TABLETS BY MOUTH TWICE A DAY THE DAY BEFORE, DAY OF AND DAY AFTER THE CHEMOTHERAPY EVERY 3 WEEKS 40 tablet 1  . docusate sodium (COLACE) 100 MG capsule Take 1 capsule (100 mg total) by mouth 2 (two) times daily. 40 capsule 0  . Ferrous Sulfate (IRON) 325 (65 Fe) MG TABS Take 1 tablet by mouth 2 (two) times daily.     Marland Kitchen ibuprofen  (ADVIL,MOTRIN) 200 MG tablet Take 400 mg by mouth every 6 (six) hours as needed for moderate pain.    Marland Kitchen insulin NPH-regular Human (NOVOLIN 70/30) (70-30) 100 UNIT/ML injection Inject 35 Units into the skin 2 (two) times daily with a meal. (Patient taking differently: Inject 20 Units into the skin 2 (two) times daily with a meal. ONLY IF CBG IS OVER 200.) 10 mL 11  . lidocaine-prilocaine (EMLA) cream Apply 1 application topically as needed. 30 g 0  . simvastatin (ZOCOR) 40 MG tablet Take 1 tablet (40 mg total) by mouth daily. (Patient taking differently: Take 40 mg by mouth every morning. ) 30 tablet 3  . vitamin C (ASCORBIC ACID) 500 MG tablet Take 500 mg by mouth 2 (two) times daily.     Marland Kitchen HYDROcodone-acetaminophen (NORCO/VICODIN) 5-325 MG tablet Take 2 tablets by mouth every 6 (six) hours as needed. (Patient not taking: Reported on 12/16/2015) 30 tablet 0  . ondansetron (ZOFRAN) 8 MG tablet TAKE 1 TABLET EVERY 8 HOURS AS NEEDED FOR NAUSEA AND VOMITING (Patient not taking: Reported on 12/16/2015) 20 tablet 1  . prochlorperazine (COMPAZINE) 10 MG tablet TAKE 1 TABLET (10 MG TOTAL) BY MOUTH EVERY 6 (SIX) HOURS AS NEEDED FOR NAUSEA OR VOMITING. (Patient not taking: Reported on 12/16/2015) 30 tablet 0  . temazepam (RESTORIL) 30 MG capsule TAKE ONE CAPSULE AT BEDTIME (Patient not taking: Reported on 12/16/2015) 30 capsule 0   No current facility-administered medications for this visit.     SURGICAL HISTORY:  Past Surgical History:  Procedure Laterality Date  . CATARACT EXTRACTION, BILATERAL  08/2012  . PILONIDAL CYST EXCISION N/A 10/17/2015   Procedure: EXCISION PILONIDAL DISEASE;  Surgeon: Leighton Ruff, MD;  Location: [facility];  Service: General;  Laterality: N/A;  . PORTACATH PLACEMENT  01/30/2015  . RETINAL DETACHMENT SURGERY Bilateral 04/2012    REVIEW OF SYSTEMS:  Constitutional: positive for fatigue Eyes: negative Ears, nose, mouth, throat, and face: negative Respiratory:  positive for cough and dyspnea on exertion Cardiovascular: negative Gastrointestinal: positive for constipation Genitourinary:negative Integument/breast: negative Hematologic/lymphatic: negative Musculoskeletal:negative Neurological: negative Behavioral/Psych: negative Endocrine: negative Allergic/Immunologic: negative   PHYSICAL EXAMINATION: General appearance: alert, cooperative, fatigued and no distress Head: Normocephalic, without obvious abnormality, atraumatic Neck: no adenopathy, no JVD, supple, symmetrical, trachea midline and thyroid not enlarged, symmetric, no tenderness/mass/nodules Lymph nodes: Cervical, supraclavicular, and axillary nodes normal. Resp: clear to auscultation bilaterally Back: symmetric, no curvature. ROM normal. No CVA tenderness. Cardio: regular rate and rhythm, S1, S2 normal, no murmur, click, rub or gallop GI: soft, non-tender; bowel sounds normal; no masses,  no organomegaly Extremities: extremities normal, atraumatic, no cyanosis or edema Neurologic: Alert and oriented X 3, normal strength and tone. Normal symmetric reflexes. Normal coordination and gait  ECOG PERFORMANCE STATUS: 1 - Symptomatic but completely  ambulatory  Blood pressure (!) 167/102, pulse 99, temperature 98.3 F (36.8 C), temperature source Oral, resp. rate 16, height '5\' 11"'$  (1.803 m), weight 193 lb 11.2 oz (87.9 kg), SpO2 100 %.  LABORATORY DATA: Lab Results  Component Value Date   WBC 14.8 (H) 12/16/2015   HGB 9.5 (L) 12/16/2015   HCT 29.2 (L) 12/16/2015   MCV 67.7 (L) 12/16/2015   PLT 183 12/16/2015      Chemistry      Component Value Date/Time   NA 137 12/16/2015 1101   K 5.5 (H) 12/16/2015 1101   CL 111 11/19/2015 0448   CO2 22 12/16/2015 1101   BUN 26.0 12/16/2015 1101   CREATININE 1.6 (H) 12/16/2015 1101   GLU 396 (H) 10/28/2015 1249      Component Value Date/Time   CALCIUM 9.1 12/16/2015 1101   ALKPHOS 134 12/16/2015 1101   AST 12 12/16/2015 1101   ALT 15  12/16/2015 1101   BILITOT 0.25 12/16/2015 1101       RADIOGRAPHIC STUDIES: No results found.  ASSESSMENT AND PLAN: This is a very pleasant 55 years old African-American male with a stage IV non-small cell lung cancer, squamous cell carcinoma who completed systemic chemotherapy with carboplatin and gemcitabine status post 6 cycles. The patient has tolerated his treatment fairly well with no significant adverse effects except for mild fatigue and nausea. He had evidence for disease progression and the patient was started on treatment with immunotherapy with Nivolumab currently status post 4 cycles. Unfortunately the recent CT scan of the chest, abdomen and pelvis showed interval progression of his disease with increase in the size of the dominant anterior mediastinal mass and left lower lobe perihilar mass. There was also development of new subcarinal adenopathy and multiple new pulmonary nodules as well as questionable liver lesion. He is currently undergoing systemic chemotherapy with docetaxel and Cyramza status post 10 cycles. He is tolerating his treatment fairly well. I recommended for the patient to proceed with cycle #11 today as a scheduled. For the hyperkalemia, he was advised to decrease any potassium-rich diet. For hypertension, I will give the patient a dose of clonidine 0.2 mg by mouth 1 today and he was advised to follow-up with his primary care physician. For insomnia, he will continue on Restoril 30 mg by mouth daily at bedtime when necessary. He would come back for follow-up visit in 3 weeks for reevaluation before starting cycle #12 after repeating CT scan of the chest, abdomen and pelvis for restaging of his disease.Marland Kitchen He was advised to call immediately if he has any concerning symptoms in the interval. The patient voices understanding of current disease status and treatment options and is in agreement with the current care plan.  All questions were answered. The patient knows  to call the clinic with any problems, questions or concerns. We can certainly see the patient much sooner if necessary.  Disclaimer: This note was dictated with voice recognition software. Similar sounding words can inadvertently be transcribed and may not be corrected upon review.

## 2015-12-16 NOTE — Patient Instructions (Signed)
[facility] Discharge Instructions for Patients Receiving Chemotherapy  Today you received the following chemotherapy agents: Cyramza and Toxotere.  To help prevent nausea and vomiting after your treatment, we encourage you to take your nausea medication as prescribed. If you develop nausea and vomiting that is not controlled by your nausea medication, call the clinic.   BELOW ARE SYMPTOMS THAT SHOULD BE REPORTED IMMEDIATELY:  *FEVER GREATER THAN 100.5 F  *CHILLS WITH OR WITHOUT FEVER  NAUSEA AND VOMITING THAT IS NOT CONTROLLED WITH YOUR NAUSEA MEDICATION  *UNUSUAL SHORTNESS OF BREATH  *UNUSUAL BRUISING OR BLEEDING  TENDERNESS IN MOUTH AND THROAT WITH OR WITHOUT PRESENCE OF ULCERS  *URINARY PROBLEMS  *BOWEL PROBLEMS  UNUSUAL RASH Items with * indicate a potential emergency and should be followed up as soon as possible.  Feel free to call the clinic you have any questions or concerns. The clinic phone number is [phone].  Please show the [facility] at check-in to the Emergency Department and triage nurse.

## 2015-12-16 NOTE — Telephone Encounter (Signed)
Message sent to chemo scheduler to add chemo per 12/16/15 los.  2 bottles of contrast given to patient per CT ordered. All weekly  labs and  follow up appointments scheduled per 12/16/15 los. AVS report and appointment schedule given to patient, per 12/16/15 los.

## 2015-12-17 ENCOUNTER — Telehealth: Payer: Self-pay | Admitting: *Deleted

## 2015-12-17 NOTE — Telephone Encounter (Signed)
Per LOS I have scheduled appts and notified the scheduler 

## 2015-12-18 ENCOUNTER — Ambulatory Visit (HOSPITAL_BASED_OUTPATIENT_CLINIC_OR_DEPARTMENT_OTHER): Payer: Medicare Other

## 2015-12-18 DIAGNOSIS — C3492 Malignant neoplasm of unspecified part of left bronchus or lung: Secondary | ICD-10-CM

## 2015-12-18 DIAGNOSIS — C3432 Malignant neoplasm of lower lobe, left bronchus or lung: Secondary | ICD-10-CM | POA: Diagnosis present

## 2015-12-18 MED ORDER — PEGFILGRASTIM INJECTION 6 MG/0.6ML ~~LOC~~
6.0000 mg | PREFILLED_SYRINGE | Freq: Once | SUBCUTANEOUS | Status: AC
  Administered 2015-12-18: 6 mg via SUBCUTANEOUS
  Filled 2015-12-18: qty 0.6

## 2015-12-23 ENCOUNTER — Other Ambulatory Visit (HOSPITAL_BASED_OUTPATIENT_CLINIC_OR_DEPARTMENT_OTHER): Payer: Medicare Other

## 2015-12-23 DIAGNOSIS — R0602 Shortness of breath: Secondary | ICD-10-CM

## 2015-12-23 DIAGNOSIS — R059 Cough, unspecified: Secondary | ICD-10-CM

## 2015-12-23 DIAGNOSIS — C3432 Malignant neoplasm of lower lobe, left bronchus or lung: Secondary | ICD-10-CM | POA: Diagnosis present

## 2015-12-23 DIAGNOSIS — R05 Cough: Secondary | ICD-10-CM

## 2015-12-23 LAB — CBC WITH DIFFERENTIAL/PLATELET
BASO%: 0.5 % (ref 0.0–2.0)
BASOS ABS: 0.1 10*3/uL (ref 0.0–0.1)
EOS%: 0.8 % (ref 0.0–7.0)
Eosinophils Absolute: 0.1 10*3/uL (ref 0.0–0.5)
HEMATOCRIT: 27.2 % — AB (ref 38.4–49.9)
HEMOGLOBIN: 9 g/dL — AB (ref 13.0–17.1)
LYMPH%: 11.8 % — AB (ref 14.0–49.0)
MCH: 22.2 pg — AB (ref 27.2–33.4)
MCHC: 33.1 g/dL (ref 32.0–36.0)
MCV: 67 fL — AB (ref 79.3–98.0)
MONO#: 2.8 10*3/uL — AB (ref 0.1–0.9)
MONO%: 21.5 % — ABNORMAL HIGH (ref 0.0–14.0)
NEUT#: 8.6 10*3/uL — ABNORMAL HIGH (ref 1.5–6.5)
NEUT%: 65.4 % (ref 39.0–75.0)
PLATELETS: 84 10*3/uL — AB (ref 140–400)
RBC: 4.06 10*6/uL — ABNORMAL LOW (ref 4.20–5.82)
RDW: 18.1 % — ABNORMAL HIGH (ref 11.0–14.6)
WBC: 13.1 10*3/uL — ABNORMAL HIGH (ref 4.0–10.3)
lymph#: 1.6 10*3/uL (ref 0.9–3.3)
nRBC: 1 % — ABNORMAL HIGH (ref 0–0)

## 2015-12-23 LAB — COMPREHENSIVE METABOLIC PANEL
ALBUMIN: 2.6 g/dL — AB (ref 3.5–5.0)
ALK PHOS: 156 U/L — AB (ref 40–150)
ALT: 13 U/L (ref 0–55)
ANION GAP: 7 meq/L (ref 3–11)
AST: 19 U/L (ref 5–34)
BUN: 13.4 mg/dL (ref 7.0–26.0)
CHLORIDE: 110 meq/L — AB (ref 98–109)
CO2: 24 mEq/L (ref 22–29)
Calcium: 8.5 mg/dL (ref 8.4–10.4)
Creatinine: 1.3 mg/dL (ref 0.7–1.3)
EGFR: 73 mL/min/{1.73_m2} — AB (ref >90)
Glucose: 225 mg/dl — ABNORMAL HIGH (ref 70–140)
POTASSIUM: 4.7 meq/L (ref 3.5–5.1)
Sodium: 141 mEq/L (ref 136–145)
Total Bilirubin: 0.35 mg/dL (ref 0.20–1.20)
Total Protein: 6 g/dL — ABNORMAL LOW (ref 6.4–8.3)

## 2015-12-29 ENCOUNTER — Telehealth: Payer: Self-pay | Admitting: Medical Oncology

## 2015-12-29 ENCOUNTER — Other Ambulatory Visit: Payer: Self-pay | Admitting: Medical Oncology

## 2015-12-29 DIAGNOSIS — R059 Cough, unspecified: Secondary | ICD-10-CM

## 2015-12-29 DIAGNOSIS — R05 Cough: Secondary | ICD-10-CM

## 2015-12-29 MED ORDER — BENZONATATE 100 MG PO CAPS: 100.0000 mg | ORAL_CAPSULE | Freq: Three times a day (TID) | ORAL | 0 refills | Status: DC | PRN

## 2015-12-29 NOTE — Telephone Encounter (Signed)
Scan not authorized. CC Keith Holloway

## 2015-12-30 ENCOUNTER — Other Ambulatory Visit: Payer: Self-pay | Admitting: Medical Oncology

## 2015-12-30 ENCOUNTER — Other Ambulatory Visit (HOSPITAL_BASED_OUTPATIENT_CLINIC_OR_DEPARTMENT_OTHER): Payer: Medicare Other

## 2015-12-30 DIAGNOSIS — C3492 Malignant neoplasm of unspecified part of left bronchus or lung: Secondary | ICD-10-CM

## 2015-12-30 DIAGNOSIS — Z79899 Other long term (current) drug therapy: Secondary | ICD-10-CM | POA: Diagnosis not present

## 2015-12-30 DIAGNOSIS — C3432 Malignant neoplasm of lower lobe, left bronchus or lung: Secondary | ICD-10-CM | POA: Diagnosis present

## 2015-12-30 DIAGNOSIS — Z5111 Encounter for antineoplastic chemotherapy: Secondary | ICD-10-CM

## 2015-12-30 DIAGNOSIS — N182 Chronic kidney disease, stage 2 (mild): Secondary | ICD-10-CM

## 2015-12-30 LAB — CBC WITH DIFFERENTIAL/PLATELET
BASO%: 0.2 % (ref 0.0–2.0)
Basophils Absolute: 0 10*3/uL (ref 0.0–0.1)
EOS%: 0.1 % (ref 0.0–7.0)
Eosinophils Absolute: 0 10*3/uL (ref 0.0–0.5)
HCT: 29 % — ABNORMAL LOW (ref 38.4–49.9)
HEMOGLOBIN: 9.4 g/dL — AB (ref 13.0–17.1)
LYMPH#: 2.3 10*3/uL (ref 0.9–3.3)
LYMPH%: 10.3 % — ABNORMAL LOW (ref 14.0–49.0)
MCH: 22.3 pg — AB (ref 27.2–33.4)
MCHC: 32.4 g/dL (ref 32.0–36.0)
MCV: 68.7 fL — ABNORMAL LOW (ref 79.3–98.0)
MONO#: 1.5 10*3/uL — AB (ref 0.1–0.9)
MONO%: 6.6 % (ref 0.0–14.0)
NEUT%: 82.8 % — AB (ref 39.0–75.0)
NEUTROS ABS: 18.5 10*3/uL — AB (ref 1.5–6.5)
Platelets: 118 10*3/uL — ABNORMAL LOW (ref 140–400)
RBC: 4.22 10*6/uL (ref 4.20–5.82)
RDW: 19.5 % — ABNORMAL HIGH (ref 11.0–14.6)
WBC: 22.4 10*3/uL — AB (ref 4.0–10.3)
nRBC: 0 % (ref 0–0)

## 2015-12-30 LAB — COMPREHENSIVE METABOLIC PANEL
ALBUMIN: 2.7 g/dL — AB (ref 3.5–5.0)
ALT: 10 U/L (ref 0–55)
AST: 18 U/L (ref 5–34)
Alkaline Phosphatase: 176 U/L — ABNORMAL HIGH (ref 40–150)
Anion Gap: 7 mEq/L (ref 3–11)
BUN: 15.4 mg/dL (ref 7.0–26.0)
CHLORIDE: 112 meq/L — AB (ref 98–109)
CO2: 24 mEq/L (ref 22–29)
CREATININE: 1.6 mg/dL — AB (ref 0.7–1.3)
Calcium: 8.7 mg/dL (ref 8.4–10.4)
EGFR: 55 mL/min/{1.73_m2} — ABNORMAL LOW (ref >90)
Glucose: 174 mg/dl — ABNORMAL HIGH (ref 70–140)
POTASSIUM: 4.7 meq/L (ref 3.5–5.1)
SODIUM: 143 meq/L (ref 136–145)
Total Bilirubin: 0.28 mg/dL (ref 0.20–1.20)
Total Protein: 6.3 g/dL — ABNORMAL LOW (ref 6.4–8.3)

## 2015-12-30 LAB — UA PROTEIN, DIPSTICK - CHCC: PROTEIN: 300 mg/dL

## 2016-01-01 ENCOUNTER — Encounter (HOSPITAL_COMMUNITY): Payer: Self-pay

## 2016-01-01 ENCOUNTER — Ambulatory Visit (HOSPITAL_COMMUNITY)
Admission: RE | Admit: 2016-01-01 | Discharge: 2016-01-01 | Disposition: A | Discharge status: Home / Self Care | Payer: Medicare Other | Source: Ambulatory Visit | Attending: Internal Medicine | Admitting: Internal Medicine

## 2016-01-01 DIAGNOSIS — J9811 Atelectasis: Secondary | ICD-10-CM | POA: Diagnosis not present

## 2016-01-01 DIAGNOSIS — I10 Essential (primary) hypertension: Secondary | ICD-10-CM

## 2016-01-01 DIAGNOSIS — C3490 Malignant neoplasm of unspecified part of unspecified bronchus or lung: Secondary | ICD-10-CM | POA: Diagnosis present

## 2016-01-01 DIAGNOSIS — Z5111 Encounter for antineoplastic chemotherapy: Secondary | ICD-10-CM

## 2016-01-01 DIAGNOSIS — C3492 Malignant neoplasm of unspecified part of left bronchus or lung: Secondary | ICD-10-CM

## 2016-01-01 DIAGNOSIS — J90 Pleural effusion, not elsewhere classified: Secondary | ICD-10-CM | POA: Diagnosis not present

## 2016-01-01 DIAGNOSIS — I313 Pericardial effusion (noninflammatory): Secondary | ICD-10-CM | POA: Diagnosis not present

## 2016-01-01 DIAGNOSIS — E875 Hyperkalemia: Secondary | ICD-10-CM

## 2016-01-02 ENCOUNTER — Other Ambulatory Visit: Payer: Self-pay | Admitting: Internal Medicine

## 2016-01-05 ENCOUNTER — Other Ambulatory Visit: Payer: Self-pay | Admitting: Medical Oncology

## 2016-01-05 DIAGNOSIS — C3492 Malignant neoplasm of unspecified part of left bronchus or lung: Secondary | ICD-10-CM

## 2016-01-06 ENCOUNTER — Ambulatory Visit (HOSPITAL_BASED_OUTPATIENT_CLINIC_OR_DEPARTMENT_OTHER): Payer: Medicare Other | Admitting: Internal Medicine

## 2016-01-06 ENCOUNTER — Encounter: Payer: Self-pay | Admitting: Internal Medicine

## 2016-01-06 ENCOUNTER — Other Ambulatory Visit (HOSPITAL_BASED_OUTPATIENT_CLINIC_OR_DEPARTMENT_OTHER): Payer: Medicare Other

## 2016-01-06 ENCOUNTER — Ambulatory Visit (HOSPITAL_BASED_OUTPATIENT_CLINIC_OR_DEPARTMENT_OTHER): Payer: Medicare Other

## 2016-01-06 VITALS — HR 95

## 2016-01-06 VITALS — BP 150/98 | HR 107 | Temp 97.9°F | Resp 17 | Ht 71.0 in | Wt 190.3 lb

## 2016-01-06 DIAGNOSIS — Z5111 Encounter for antineoplastic chemotherapy: Secondary | ICD-10-CM

## 2016-01-06 DIAGNOSIS — C3492 Malignant neoplasm of unspecified part of left bronchus or lung: Secondary | ICD-10-CM

## 2016-01-06 DIAGNOSIS — R05 Cough: Secondary | ICD-10-CM | POA: Diagnosis not present

## 2016-01-06 DIAGNOSIS — J910 Malignant pleural effusion: Secondary | ICD-10-CM

## 2016-01-06 DIAGNOSIS — R911 Solitary pulmonary nodule: Secondary | ICD-10-CM | POA: Diagnosis not present

## 2016-01-06 DIAGNOSIS — R599 Enlarged lymph nodes, unspecified: Secondary | ICD-10-CM

## 2016-01-06 DIAGNOSIS — R0609 Other forms of dyspnea: Secondary | ICD-10-CM | POA: Diagnosis not present

## 2016-01-06 DIAGNOSIS — C3432 Malignant neoplasm of lower lobe, left bronchus or lung: Secondary | ICD-10-CM

## 2016-01-06 DIAGNOSIS — Z5112 Encounter for antineoplastic immunotherapy: Secondary | ICD-10-CM | POA: Diagnosis present

## 2016-01-06 DIAGNOSIS — I10 Essential (primary) hypertension: Secondary | ICD-10-CM

## 2016-01-06 DIAGNOSIS — G4700 Insomnia, unspecified: Secondary | ICD-10-CM | POA: Diagnosis not present

## 2016-01-06 DIAGNOSIS — J90 Pleural effusion, not elsewhere classified: Secondary | ICD-10-CM

## 2016-01-06 HISTORY — DX: Pleural effusion, not elsewhere classified: J90

## 2016-01-06 LAB — COMPREHENSIVE METABOLIC PANEL
ALT: 10 U/L (ref 0–55)
ANION GAP: 8 meq/L (ref 3–11)
AST: 13 U/L (ref 5–34)
Albumin: 2.9 g/dL — ABNORMAL LOW (ref 3.5–5.0)
Alkaline Phosphatase: 121 U/L (ref 40–150)
BUN: 36.9 mg/dL — ABNORMAL HIGH (ref 7.0–26.0)
CHLORIDE: 110 meq/L — AB (ref 98–109)
CO2: 21 meq/L — AB (ref 22–29)
Calcium: 9.5 mg/dL (ref 8.4–10.4)
Creatinine: 1.5 mg/dL — ABNORMAL HIGH (ref 0.7–1.3)
EGFR: 60 mL/min/{1.73_m2} — AB (ref >90)
Glucose: 314 mg/dl — ABNORMAL HIGH (ref 70–140)
Potassium: 5.3 mEq/L — ABNORMAL HIGH (ref 3.5–5.1)
Sodium: 139 mEq/L (ref 136–145)
Total Protein: 6.8 g/dL (ref 6.4–8.3)

## 2016-01-06 LAB — CBC WITH DIFFERENTIAL/PLATELET
BASO%: 1 % (ref 0.0–2.0)
Basophils Absolute: 0.2 10*3/uL — ABNORMAL HIGH (ref 0.0–0.1)
EOS ABS: 0 10*3/uL (ref 0.0–0.5)
EOS%: 0 % (ref 0.0–7.0)
HCT: 29.9 % — ABNORMAL LOW (ref 38.4–49.9)
HGB: 9.4 g/dL — ABNORMAL LOW (ref 13.0–17.1)
LYMPH%: 5.6 % — AB (ref 14.0–49.0)
MCH: 21.8 pg — AB (ref 27.2–33.4)
MCHC: 31.3 g/dL — AB (ref 32.0–36.0)
MCV: 69.6 fL — AB (ref 79.3–98.0)
MONO#: 0.8 10*3/uL (ref 0.1–0.9)
MONO%: 4.2 % (ref 0.0–14.0)
NEUT#: 17.7 10*3/uL — ABNORMAL HIGH (ref 1.5–6.5)
NEUT%: 89.2 % — AB (ref 39.0–75.0)
PLATELETS: 266 10*3/uL (ref 140–400)
RBC: 4.3 10*6/uL (ref 4.20–5.82)
RDW: 19 % — ABNORMAL HIGH (ref 11.0–14.6)
WBC: 19.8 10*3/uL — ABNORMAL HIGH (ref 4.0–10.3)
lymph#: 1.1 10*3/uL (ref 0.9–3.3)

## 2016-01-06 MED ORDER — SODIUM CHLORIDE 0.9 % IV SOLN
Freq: Once | INTRAVENOUS | Status: AC
  Administered 2016-01-06: 12:00:00 via INTRAVENOUS

## 2016-01-06 MED ORDER — RAMUCIRUMAB CHEMO INJECTION 500 MG/50ML
10.0000 mg/kg | Freq: Once | INTRAVENOUS | Status: AC
  Administered 2016-01-06: 800 mg via INTRAVENOUS
  Filled 2016-01-06: qty 50

## 2016-01-06 MED ORDER — DIPHENHYDRAMINE HCL 50 MG/ML IJ SOLN
50.0000 mg | Freq: Once | INTRAMUSCULAR | Status: AC
  Administered 2016-01-06: 50 mg via INTRAVENOUS

## 2016-01-06 MED ORDER — DIPHENHYDRAMINE HCL 50 MG/ML IJ SOLN
INTRAMUSCULAR | Status: AC
  Filled 2016-01-06: qty 1

## 2016-01-06 MED ORDER — ACETAMINOPHEN 325 MG PO TABS
650.0000 mg | ORAL_TABLET | Freq: Once | ORAL | Status: AC
  Administered 2016-01-06: 650 mg via ORAL

## 2016-01-06 MED ORDER — SODIUM CHLORIDE 0.9% FLUSH
10.0000 mL | INTRAVENOUS | Status: DC | PRN
  Administered 2016-01-06: 10 mL
  Filled 2016-01-06: qty 10

## 2016-01-06 MED ORDER — DOCETAXEL CHEMO INJECTION 160 MG/16ML
75.0000 mg/m2 | Freq: Once | INTRAVENOUS | Status: AC
  Administered 2016-01-06: 150 mg via INTRAVENOUS
  Filled 2016-01-06: qty 15

## 2016-01-06 MED ORDER — HEPARIN SOD (PORK) LOCK FLUSH 100 UNIT/ML IV SOLN
500.0000 [IU] | Freq: Once | INTRAVENOUS | Status: AC | PRN
  Administered 2016-01-06: 500 [IU]
  Filled 2016-01-06: qty 5

## 2016-01-06 MED ORDER — DOCETAXEL CHEMO INJECTION 160 MG/16ML: 75.0000 mg/m2 | Freq: Once | INTRAVENOUS | Status: DC

## 2016-01-06 MED ORDER — DEXAMETHASONE SODIUM PHOSPHATE 10 MG/ML IJ SOLN
10.0000 mg | Freq: Once | INTRAMUSCULAR | Status: AC
  Administered 2016-01-06: 10 mg via INTRAVENOUS

## 2016-01-06 MED ORDER — ACETAMINOPHEN 325 MG PO TABS
ORAL_TABLET | ORAL | Status: AC
  Filled 2016-01-06: qty 2

## 2016-01-06 MED ORDER — DEXAMETHASONE SODIUM PHOSPHATE 10 MG/ML IJ SOLN
INTRAMUSCULAR | Status: AC
  Filled 2016-01-06: qty 1

## 2016-01-06 NOTE — Progress Notes (Signed)
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  PRIOR THERAPY:  1) Systemic chemotherapy with carboplatin for AUC of 5 given on day 1 and gemcitabine 1000 MG/M2 on days 1 and 8 every 3 weeks. Status post 6 cycles with partial response. 2) Nivolumab 240 MG IV every 2 weeks status post 4 cycles, last dose was given 03/18/2015 discontinued today secondary to disease progression.  CURRENT THERAPY: Systemic chemotherapy with docetaxel 75 MG/M2 and Cyramza 10 MG/KG every 3 weeks, first dose 04/08/2015. Status post 11 cycles.  CODE STATUS: Full code initially but no prolonged resuscitation.   INTERVAL HISTORY: Keith Holloway 55 y.o. male returns to the clinic today for follow-up visit accompanied by his wife. The patient is feeling fine today with no specific complaints except for increasing cough and shortness of breath with exertion. He tolerated the last cycle of his treatment fairly well with no significant adverse effects. He denied having any bleeding issues. The patient denied having any significant chest pain or hemoptysis. He denied having any nausea or vomiting but has constipation. No significant fever or chills. He mentioned that his blood pressure is always elevated when he takes the steroid premedication. He has repeat CT scan of the chest, abdomen and pelvis performed recently and he is here for evaluation and discussion of his scan results.  MEDICAL HISTORY: Past Medical History:  Diagnosis Date  . Acute hyperglycemia 11/26/2015  . Alcohol abuse   . Arthritis   . Borderline hypertension   . CHF (congestive heart failure) ([place])   . CKD (chronic kidney disease), stage III   . Cocaine abuse    last [employer] post[employer] for cocaine 09-30-2015    . Depression   . Dyspnea on effort   . Elevated blood sugar 11/26/2015  . History of acute renal failure    09-30-2015  . History of hyperkalemia    admission 09-30-2015 potassiom 8.0  with medications currently 10-14-2015 down to 4.7 normal  . History of traumatic head injury    1987  w/ LOC   --- no residual  . Hyperlipidemia   . Hypertension 12/16/2015  . 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 Memory disorder 08/22/2013   alcohol abuse  . Migraine   . Nocturia   . Non-small cell carcinoma of lung, stage 4 [facility]) oncologist-  dr Julien Nordmann   w/ METs to liver dx 04/ 2016--  Stage IV (T2a, N3, M1b), squamous cell carcinoma of left lower lobe---  systemic chemotherapy every 3 weeks and currently due to evidence for disease progression has started immunotherapy  . Pilonidal abscess    recurrent  . Pleural effusion, bilateral    per ct 10-09-2015  new small right effusion and left small to moderate effusion , increased  . Polyneuropathy in diabetes(357.2)   . Port-a-cath in place   . Pulmonary nodules/lesions, multiple    pt has stage 4 lung cancer  . Tobacco abuse counseling 10/09/2014  . Type 2 diabetes mellitus with insulin therapy ([place])   . Wears dentures    upper    ALLERGIES:  has No Known Allergies.  MEDICATIONS:  Current Outpatient Prescriptions  Medication Sig Dispense Refill  . amLODipine (NORVASC) 10  MG tablet Take 1 tablet (10 mg total) by mouth daily. 30 tablet 1  . aspirin EC 81 MG tablet Take 1 tablet by mouth every morning.     . benzonatate (TESSALON) 100 MG capsule Take 1 capsule (100 mg total) by mouth 3 (three) times daily as needed for cough. 20 capsule 0  . Cholecalciferol (VITAMIN D-3) 1000 units CAPS Take 1,000 Units by mouth daily.    Marland Kitchen dexamethasone (DECADRON) 4 MG tablet 2 TABLETS BY MOUTH TWICE A DAY THE DAY BEFORE, DAY OF AND DAY AFTER THE CHEMOTHERAPY EVERY 3 WEEKS 40 tablet 1  . docusate sodium  (COLACE) 100 MG capsule Take 1 capsule (100 mg total) by mouth 2 (two) times daily. 40 capsule 0  . Ferrous Sulfate (IRON) 325 (65 Fe) MG TABS Take 1 tablet by mouth 2 (two) times daily.     . insulin NPH-regular Human (NOVOLIN 70/30) (70-30) 100 UNIT/ML injection Inject 35 Units into the skin 2 (two) times daily with a meal. (Patient taking differently: Inject 20 Units into the skin 2 (two) times daily with a meal. ONLY IF CBG IS OVER 200.) 10 mL 11  . lidocaine-prilocaine (EMLA) cream Apply 1 application topically as needed. 30 g 0  . simvastatin (ZOCOR) 40 MG tablet Take 1 tablet (40 mg total) by mouth daily. (Patient taking differently: Take 40 mg by mouth every morning. ) 30 tablet 3  . vitamin C (ASCORBIC ACID) 500 MG tablet Take 500 mg by mouth 2 (two) times daily.     Marland Kitchen acetaminophen (TYLENOL) 325 MG tablet Take 2 tablets (650 mg total) by mouth every 6 (six) hours as needed for mild pain (or Fever >/= 101). (Patient not taking: Reported on 01/06/2016) 30 tablet 0  . HYDROcodone-acetaminophen (NORCO/VICODIN) 5-325 MG tablet Take 2 tablets by mouth every 6 (six) hours as needed. (Patient not taking: Reported on 01/06/2016) 30 tablet 0  . ibuprofen (ADVIL,MOTRIN) 200 MG tablet Take 400 mg by mouth every 6 (six) hours as needed for moderate pain.    Marland Kitchen ondansetron (ZOFRAN) 8 MG tablet TAKE 1 TABLET EVERY 8 HOURS AS NEEDED FOR NAUSEA AND VOMITING (Patient not taking: Reported on 01/06/2016) 20 tablet 1  . prochlorperazine (COMPAZINE) 10 MG tablet TAKE 1 TABLET (10 MG TOTAL) BY MOUTH EVERY 6 (SIX) HOURS AS NEEDED FOR NAUSEA OR VOMITING. (Patient not taking: Reported on 01/06/2016) 30 tablet 0  . temazepam (RESTORIL) 30 MG capsule TAKE ONE CAPSULE AT BEDTIME (Patient not taking: Reported on 01/06/2016) 30 capsule 0   No current facility-administered medications for this visit.     SURGICAL HISTORY:  Past Surgical History:  Procedure Laterality Date  . CATARACT EXTRACTION, BILATERAL  08/2012  .  PILONIDAL CYST EXCISION N/A 10/17/2015   Procedure: EXCISION PILONIDAL DISEASE;  Surgeon: Leighton Ruff, MD;  [facility];  Service: General;  Laterality: N/A;  . PORTACATH PLACEMENT  01/30/2015  . RETINAL DETACHMENT SURGERY Bilateral 04/2012    REVIEW OF SYSTEMS:  Constitutional: positive for fatigue Eyes: negative Ears, nose, mouth, throat, and face: negative Respiratory: positive for cough and dyspnea on exertion Cardiovascular: negative Gastrointestinal: positive for constipation Genitourinary:negative Integument/breast: negative Hematologic/lymphatic: negative Musculoskeletal:negative Neurological: negative Behavioral/Psych: negative Endocrine: negative Allergic/Immunologic: negative   PHYSICAL EXAMINATION: General appearance: alert, cooperative, fatigued and no distress Head: Normocephalic, without obvious abnormality, atraumatic Neck: no adenopathy, no JVD, supple, symmetrical, trachea midline and thyroid not enlarged, symmetric, no tenderness/mass/nodules Lymph nodes: Cervical, supraclavicular, and axillary nodes normal. Resp: clear to  auscultation bilaterally Back: symmetric, no curvature. ROM normal. No CVA tenderness. Cardio: regular rate and rhythm, S1, S2 normal, no murmur, click, rub or gallop GI: soft, non-tender; bowel sounds normal; no masses,  no organomegaly Extremities: extremities normal, atraumatic, no cyanosis or edema Neurologic: Alert and oriented X 3, normal strength and tone. Normal symmetric reflexes. Normal coordination and gait  ECOG PERFORMANCE STATUS: 1 - Symptomatic but completely ambulatory  Blood pressure (!) 150/98, pulse (!) 107, temperature 97.9 F (36.6 C), temperature source Oral, resp. rate 17, height '5\' 11"'$  (1.803 m), weight 190 lb 4.8 oz (86.3 kg), SpO2 99 %.  LABORATORY DATA: Lab Results  Component Value Date   WBC 19.8 (H) 01/06/2016   HGB 9.4 (L) 01/06/2016   HCT 29.9 (L) 01/06/2016   MCV 69.6 (L)  01/06/2016   PLT 266 01/06/2016      Chemistry      Component Value Date/Time   NA 143 12/30/2015 1501   K 4.7 12/30/2015 1501   CL 111 11/19/2015 0448   CO2 24 12/30/2015 1501   BUN 15.4 12/30/2015 1501   CREATININE 1.6 (H) 12/30/2015 1501   GLU 396 (H) 10/28/2015 1249      Component Value Date/Time   CALCIUM 8.7 12/30/2015 1501   ALKPHOS 176 (H) 12/30/2015 1501   AST 18 12/30/2015 1501   ALT 10 12/30/2015 1501   BILITOT 0.28 12/30/2015 1501       RADIOGRAPHIC STUDIES: Ct Abdomen Pelvis Wo Contrast  Result Date: 01/01/2016 CLINICAL DATA:  Followup stage IV left lung non-small cell carcinoma. Ongoing chemotherapy. Restaging. EXAM: CT CHEST, ABDOMEN AND PELVIS WITHOUT CONTRAST TECHNIQUE: Multidetector CT imaging of the chest, abdomen and pelvis was performed following the standard protocol without IV contrast. COMPARISON:  10/09/2015 FINDINGS: CT CHEST FINDINGS Cardiovascular: Normal heart size. Stable small pericardial effusion. Right-sided Port-A-Cath in appropriate position. Mediastinum/Lymph Nodes: Anterior mediastinal mass or lymphadenopathy currently measures 6.6 x 3.9 cm on image 28/2 compared to 5.9 x 3.0 cm previously. No new sites of lymphadenopathy visualized on this unenhanced exam. Lungs/Pleura: Moderate to large left pleural effusion has increased in size since prior study. Increased compressive atelectasis is seen involving the left lung. Mild right lower lobe scarring is stable. Previously seen tiny right pleural effusion has nearly completely resolved since prior study. Musculoskeletal: No suspicious bone lesions or other significant abnormality. CT ABDOMEN AND PELVIS FINDINGS Hepatobiliary: Several subtle ill-defined low-attenuation lesions are seen in the anterior dome of the right hepatic lobe and in the left hepatic lobe. Largest measures 2 cm in the lateral segment left lobe, and these were not definitely seen on previous noncontrast study. Liver metastases cannot be  excluded. Gallbladder is unremarkable. Pancreas: No mass or inflammatory changes identified on this unenhanced exam. Spleen: Within normal limits in size. Adrenals/Urinary Tract: No evidence of urolithiasis or hydronephrosis. Unremarkable appearance of bladder. Stomach/Bowel: No evidence of obstruction, inflammatory process, or abnormal fluid collections. Vascular/Lymphatic: No pathologically enlarged lymph nodes identified. No abdominal aortic aneurysm. Reproductive:  No mass or other significant abnormality identified. Other:  None. Musculoskeletal:  No suspicious bone lesions identified. IMPRESSION: Mild increase in size of anterior mediastinal mass or lymphadenopathy since prior exam. Increased moderate to large left pleural effusion and left lung compressive atelectasis. Stable small pericardial effusion. Subtle ill-defined low-attenuation liver lesions measuring up to 2 cm, not definitely seen on previous noncontrast study, and suspicious for liver metastases. Consider abdomen MRI without and with contrast versus continued attention on follow-up CT. Electronically Signed  By: Earle Gell M.D.   On: 01/01/2016 10:14   Ct Chest Wo Contrast  Result Date: 01/01/2016 CLINICAL DATA:  Followup stage IV left lung non-small cell carcinoma. Ongoing chemotherapy. Restaging. EXAM: CT CHEST, ABDOMEN AND PELVIS WITHOUT CONTRAST TECHNIQUE: Multidetector CT imaging of the chest, abdomen and pelvis was performed following the standard protocol without IV contrast. COMPARISON:  10/09/2015 FINDINGS: CT CHEST FINDINGS Cardiovascular: Normal heart size. Stable small pericardial effusion. Right-sided Port-A-Cath in appropriate position. Mediastinum/Lymph Nodes: Anterior mediastinal mass or lymphadenopathy currently measures 6.6 x 3.9 cm on image 28/2 compared to 5.9 x 3.0 cm previously. No new sites of lymphadenopathy visualized on this unenhanced exam. Lungs/Pleura: Moderate to large left pleural effusion has increased in  size since prior study. Increased compressive atelectasis is seen involving the left lung. Mild right lower lobe scarring is stable. Previously seen tiny right pleural effusion has nearly completely resolved since prior study. Musculoskeletal: No suspicious bone lesions or other significant abnormality. CT ABDOMEN AND PELVIS FINDINGS Hepatobiliary: Several subtle ill-defined low-attenuation lesions are seen in the anterior dome of the right hepatic lobe and in the left hepatic lobe. Largest measures 2 cm in the lateral segment left lobe, and these were not definitely seen on previous noncontrast study. Liver metastases cannot be excluded. Gallbladder is unremarkable. Pancreas: No mass or inflammatory changes identified on this unenhanced exam. Spleen: Within normal limits in size. Adrenals/Urinary Tract: No evidence of urolithiasis or hydronephrosis. Unremarkable appearance of bladder. Stomach/Bowel: No evidence of obstruction, inflammatory process, or abnormal fluid collections. Vascular/Lymphatic: No pathologically enlarged lymph nodes identified. No abdominal aortic aneurysm. Reproductive:  No mass or other significant abnormality identified. Other:  None. Musculoskeletal:  No suspicious bone lesions identified. IMPRESSION: Mild increase in size of anterior mediastinal mass or lymphadenopathy since prior exam. Increased moderate to large left pleural effusion and left lung compressive atelectasis. Stable small pericardial effusion. Subtle ill-defined low-attenuation liver lesions measuring up to 2 cm, not definitely seen on previous noncontrast study, and suspicious for liver metastases. Consider abdomen MRI without and with contrast versus continued attention on follow-up CT. Electronically Signed   By: Earle Gell M.D.   On: 01/01/2016 10:14    ASSESSMENT AND PLAN: This is a very pleasant 55 years old African-American male with a stage IV non-small cell lung cancer, squamous cell carcinoma who completed  systemic chemotherapy with carboplatin and gemcitabine status post 6 cycles. The patient has tolerated his treatment fairly well with no significant adverse effects except for mild fatigue and nausea. He had evidence for disease progression and the patient was started on treatment with immunotherapy with Nivolumab currently status post 4 cycles. Unfortunately the recent CT scan of the chest, abdomen and pelvis showed interval progression of his disease with increase in the size of the dominant anterior mediastinal mass and left lower lobe perihilar mass. There was also development of new subcarinal adenopathy and multiple new pulmonary nodules as well as questionable liver lesion. He is currently undergoing systemic chemotherapy with docetaxel and Cyramza status post 11 cycles. He is tolerating his treatment fairly well. The recent CT scan of the chest, abdomen and pelvis showed mild increase in the size of the anterior mediastinal mass with increase in the size of the moderate to large left pleural effusion. I personally reviewed the scan images and the most concerning finding is increase in the left pleural effusion. I discussed the scan results with the patient and his wife. I recommended for him to continue his current treatment with  docetaxel and Cyramza and he will receive cycle #12 today. For the left pleural effusion, I would send the patient to IR for ultrasound-guided therapeutic left thoracentesis. For hypertension, he will continue on his current pressure medication.  For insomnia, he will continue on Restoril 30 mg by mouth daily at bedtime when necessary. He would come back for follow-up visit in 3 weeks for reevaluation before starting cycle #13.  He was advised to call immediately if he has any concerning symptoms in the interval. The patient voices understanding of current disease status and treatment options and is in agreement with the current care plan.  All questions were answered.  The patient knows to call the clinic with any problems, questions or concerns. We can certainly see the patient much sooner if necessary.  Disclaimer: This note was dictated with voice recognition software. Similar sounding words can inadvertently be transcribed and may not be corrected upon review.

## 2016-01-06 NOTE — Patient Instructions (Signed)
[facility] Discharge Instructions for Patients Receiving Chemotherapy  Today you received the following chemotherapy agents Cyramza and Taxotere To help prevent nausea and vomiting after your treatment, we encourage you to take your nausea medication as directed   If you develop nausea and vomiting that is not controlled by your nausea medication, call the clinic.   BELOW ARE SYMPTOMS THAT SHOULD BE REPORTED IMMEDIATELY:  *FEVER GREATER THAN 100.5 F  *CHILLS WITH OR WITHOUT FEVER  NAUSEA AND VOMITING THAT IS NOT CONTROLLED WITH YOUR NAUSEA MEDICATION  *UNUSUAL SHORTNESS OF BREATH  *UNUSUAL BRUISING OR BLEEDING  TENDERNESS IN MOUTH AND THROAT WITH OR WITHOUT PRESENCE OF ULCERS  *URINARY PROBLEMS  *BOWEL PROBLEMS  UNUSUAL RASH Items with * indicate a potential emergency and should be followed up as soon as possible.  Feel free to call the clinic you have any questions or concerns. The clinic phone number is [phone].  Please show the CHEMO ALERT CARD at check-in to the Emergency Department and triage nurse.   

## 2016-01-07 ENCOUNTER — Ambulatory Visit (HOSPITAL_BASED_OUTPATIENT_CLINIC_OR_DEPARTMENT_OTHER): Payer: Medicare Other

## 2016-01-07 VITALS — BP 161/95 | HR 104 | Temp 98.3°F | Resp 18

## 2016-01-07 DIAGNOSIS — C3492 Malignant neoplasm of unspecified part of left bronchus or lung: Secondary | ICD-10-CM

## 2016-01-07 DIAGNOSIS — C3432 Malignant neoplasm of lower lobe, left bronchus or lung: Secondary | ICD-10-CM

## 2016-01-07 MED ORDER — PEGFILGRASTIM INJECTION 6 MG/0.6ML ~~LOC~~
6.0000 mg | PREFILLED_SYRINGE | Freq: Once | SUBCUTANEOUS | Status: AC
  Administered 2016-01-07: 6 mg via SUBCUTANEOUS
  Filled 2016-01-07: qty 0.6

## 2016-01-13 ENCOUNTER — Telehealth: Payer: Self-pay | Admitting: Internal Medicine

## 2016-01-13 ENCOUNTER — Other Ambulatory Visit (HOSPITAL_BASED_OUTPATIENT_CLINIC_OR_DEPARTMENT_OTHER): Payer: Medicare Other

## 2016-01-13 DIAGNOSIS — C3432 Malignant neoplasm of lower lobe, left bronchus or lung: Secondary | ICD-10-CM | POA: Diagnosis present

## 2016-01-13 DIAGNOSIS — C3492 Malignant neoplasm of unspecified part of left bronchus or lung: Secondary | ICD-10-CM

## 2016-01-13 LAB — CBC WITH DIFFERENTIAL/PLATELET
BASO%: 0.2 % (ref 0.0–2.0)
BASOS ABS: 0 10*3/uL (ref 0.0–0.1)
EOS ABS: 0.1 10*3/uL (ref 0.0–0.5)
EOS%: 0.6 % (ref 0.0–7.0)
HEMATOCRIT: 26.2 % — AB (ref 38.4–49.9)
HEMOGLOBIN: 8.4 g/dL — AB (ref 13.0–17.1)
LYMPH#: 1.7 10*3/uL (ref 0.9–3.3)
LYMPH%: 11.7 % — ABNORMAL LOW (ref 14.0–49.0)
MCH: 21.7 pg — AB (ref 27.2–33.4)
MCHC: 32.1 g/dL (ref 32.0–36.0)
MCV: 67.7 fL — AB (ref 79.3–98.0)
MONO#: 1.9 10*3/uL — AB (ref 0.1–0.9)
MONO%: 12.4 % (ref 0.0–14.0)
NEUT#: 11.2 10*3/uL — ABNORMAL HIGH (ref 1.5–6.5)
NEUT%: 75.1 % — AB (ref 39.0–75.0)
NRBC: 0 % (ref 0–0)
Platelets: 76 10*3/uL — ABNORMAL LOW (ref 140–400)
RBC: 3.87 10*6/uL — ABNORMAL LOW (ref 4.20–5.82)
RDW: 19.8 % — AB (ref 11.0–14.6)
WBC: 14.9 10*3/uL — ABNORMAL HIGH (ref 4.0–10.3)

## 2016-01-13 LAB — COMPREHENSIVE METABOLIC PANEL
ALBUMIN: 2.5 g/dL — AB (ref 3.5–5.0)
ALK PHOS: 171 U/L — AB (ref 40–150)
ALT: 14 U/L (ref 0–55)
AST: 17 U/L (ref 5–34)
Anion Gap: 5 mEq/L (ref 3–11)
BILIRUBIN TOTAL: 0.37 mg/dL (ref 0.20–1.20)
BUN: 25 mg/dL (ref 7.0–26.0)
CALCIUM: 8.7 mg/dL (ref 8.4–10.4)
CO2: 24 mEq/L (ref 22–29)
Chloride: 113 mEq/L — ABNORMAL HIGH (ref 98–109)
Creatinine: 1.3 mg/dL (ref 0.7–1.3)
EGFR: 71 mL/min/{1.73_m2} — AB (ref >90)
GLUCOSE: 161 mg/dL — AB (ref 70–140)
Potassium: 4.8 mEq/L (ref 3.5–5.1)
SODIUM: 142 meq/L (ref 136–145)
TOTAL PROTEIN: 5.8 g/dL — AB (ref 6.4–8.3)

## 2016-01-13 NOTE — Telephone Encounter (Signed)
Patient's wife stopped by for a copy of the appointment schedule. Copy given. 01/13/16

## 2016-01-15 ENCOUNTER — Ambulatory Visit (HOSPITAL_COMMUNITY)
Admission: RE | Admit: 2016-01-15 | Discharge: 2016-01-15 | Disposition: A | Discharge status: Home / Self Care | Payer: Medicare Other | Source: Ambulatory Visit | Attending: Radiology | Admitting: Radiology

## 2016-01-15 ENCOUNTER — Ambulatory Visit (HOSPITAL_COMMUNITY)
Admission: RE | Admit: 2016-01-15 | Discharge: 2016-01-15 | Disposition: A | Discharge status: Home / Self Care | Payer: Medicare Other | Source: Ambulatory Visit | Attending: Internal Medicine | Admitting: Internal Medicine

## 2016-01-15 DIAGNOSIS — J90 Pleural effusion, not elsewhere classified: Secondary | ICD-10-CM | POA: Diagnosis present

## 2016-01-15 DIAGNOSIS — Z5111 Encounter for antineoplastic chemotherapy: Secondary | ICD-10-CM

## 2016-01-15 DIAGNOSIS — C3492 Malignant neoplasm of unspecified part of left bronchus or lung: Secondary | ICD-10-CM

## 2016-01-15 DIAGNOSIS — Z9889 Other specified postprocedural states: Secondary | ICD-10-CM

## 2016-01-15 NOTE — Procedures (Signed)
Ultrasound-guided left thoracentesis performed yielding only 20 cc slightly hazy, yellow fluid.  No immediate complications. Follow-up chest x-ray pending. Despite use of multiple needles only the above amount of fluid could be aspirated from pleural space secondary to multiloculated appearance.

## 2016-01-20 ENCOUNTER — Telehealth: Payer: Self-pay | Admitting: *Deleted

## 2016-01-20 ENCOUNTER — Other Ambulatory Visit (HOSPITAL_BASED_OUTPATIENT_CLINIC_OR_DEPARTMENT_OTHER): Payer: Medicare Other

## 2016-01-20 DIAGNOSIS — N182 Chronic kidney disease, stage 2 (mild): Secondary | ICD-10-CM

## 2016-01-20 DIAGNOSIS — C3492 Malignant neoplasm of unspecified part of left bronchus or lung: Secondary | ICD-10-CM | POA: Diagnosis present

## 2016-01-20 DIAGNOSIS — Z5111 Encounter for antineoplastic chemotherapy: Secondary | ICD-10-CM

## 2016-01-20 LAB — COMPREHENSIVE METABOLIC PANEL
ALBUMIN: 2.6 g/dL — AB (ref 3.5–5.0)
ALK PHOS: 171 U/L — AB (ref 40–150)
ALT: 12 U/L (ref 0–55)
AST: 18 U/L (ref 5–34)
Anion Gap: 7 mEq/L (ref 3–11)
BILIRUBIN TOTAL: 0.23 mg/dL (ref 0.20–1.20)
BUN: 18.3 mg/dL (ref 7.0–26.0)
CO2: 25 mEq/L (ref 22–29)
CREATININE: 1.6 mg/dL — AB (ref 0.7–1.3)
Calcium: 8.4 mg/dL (ref 8.4–10.4)
Chloride: 111 mEq/L — ABNORMAL HIGH (ref 98–109)
EGFR: 56 mL/min/{1.73_m2} — AB (ref >90)
GLUCOSE: 227 mg/dL — AB (ref 70–140)
Potassium: 5.3 mEq/L — ABNORMAL HIGH (ref 3.5–5.1)
SODIUM: 142 meq/L (ref 136–145)
TOTAL PROTEIN: 6 g/dL — AB (ref 6.4–8.3)

## 2016-01-20 LAB — CBC WITH DIFFERENTIAL/PLATELET
BASO%: 0.4 % (ref 0.0–2.0)
Basophils Absolute: 0.1 10*3/uL (ref 0.0–0.1)
EOS ABS: 0 10*3/uL (ref 0.0–0.5)
EOS%: 0.2 % (ref 0.0–7.0)
HCT: 27.8 % — ABNORMAL LOW (ref 38.4–49.9)
HEMOGLOBIN: 8.7 g/dL — AB (ref 13.0–17.1)
LYMPH%: 9.9 % — ABNORMAL LOW (ref 14.0–49.0)
MCH: 22 pg — ABNORMAL LOW (ref 27.2–33.4)
MCHC: 31.4 g/dL — ABNORMAL LOW (ref 32.0–36.0)
MCV: 70.1 fL — AB (ref 79.3–98.0)
MONO#: 1.6 10*3/uL — AB (ref 0.1–0.9)
MONO%: 8.4 % (ref 0.0–14.0)
NEUT%: 81.1 % — ABNORMAL HIGH (ref 39.0–75.0)
NEUTROS ABS: 15 10*3/uL — AB (ref 1.5–6.5)
PLATELETS: 166 10*3/uL (ref 140–400)
RBC: 3.97 10*6/uL — ABNORMAL LOW (ref 4.20–5.82)
RDW: 19.9 % — AB (ref 11.0–14.6)
WBC: 18.5 10*3/uL — AB (ref 4.0–10.3)
lymph#: 1.8 10*3/uL (ref 0.9–3.3)

## 2016-01-20 LAB — UA PROTEIN, DIPSTICK - CHCC: Protein, ur: 2000 mg/dL

## 2016-01-20 NOTE — Telephone Encounter (Signed)
Marcie ( sister ) called with concerns regarding pt needing additional tests this week prior to MD f/u on 12/12.  Returned call to [facility] left message per MD pt will not need any testing prior to MD follow up.

## 2016-01-26 ENCOUNTER — Other Ambulatory Visit: Payer: Self-pay

## 2016-01-26 DIAGNOSIS — C3492 Malignant neoplasm of unspecified part of left bronchus or lung: Secondary | ICD-10-CM

## 2016-01-27 ENCOUNTER — Ambulatory Visit (HOSPITAL_BASED_OUTPATIENT_CLINIC_OR_DEPARTMENT_OTHER): Payer: Medicare Other | Admitting: Internal Medicine

## 2016-01-27 ENCOUNTER — Encounter: Payer: Self-pay | Admitting: Internal Medicine

## 2016-01-27 ENCOUNTER — Other Ambulatory Visit (HOSPITAL_BASED_OUTPATIENT_CLINIC_OR_DEPARTMENT_OTHER): Payer: Medicare Other

## 2016-01-27 ENCOUNTER — Other Ambulatory Visit: Payer: Self-pay | Admitting: Medical Oncology

## 2016-01-27 ENCOUNTER — Ambulatory Visit (HOSPITAL_BASED_OUTPATIENT_CLINIC_OR_DEPARTMENT_OTHER): Payer: Medicare Other

## 2016-01-27 ENCOUNTER — Telehealth: Payer: Self-pay | Admitting: Internal Medicine

## 2016-01-27 VITALS — BP 150/94 | HR 94 | Temp 97.6°F | Resp 18 | Ht 71.0 in | Wt 185.7 lb

## 2016-01-27 DIAGNOSIS — Z794 Long term (current) use of insulin: Secondary | ICD-10-CM | POA: Diagnosis not present

## 2016-01-27 DIAGNOSIS — E1139 Type 2 diabetes mellitus with other diabetic ophthalmic complication: Secondary | ICD-10-CM

## 2016-01-27 DIAGNOSIS — Z5111 Encounter for antineoplastic chemotherapy: Secondary | ICD-10-CM | POA: Diagnosis not present

## 2016-01-27 DIAGNOSIS — I10 Essential (primary) hypertension: Secondary | ICD-10-CM

## 2016-01-27 DIAGNOSIS — C3432 Malignant neoplasm of lower lobe, left bronchus or lung: Secondary | ICD-10-CM | POA: Diagnosis present

## 2016-01-27 DIAGNOSIS — C3492 Malignant neoplasm of unspecified part of left bronchus or lung: Secondary | ICD-10-CM

## 2016-01-27 DIAGNOSIS — Z79899 Other long term (current) drug therapy: Secondary | ICD-10-CM | POA: Diagnosis not present

## 2016-01-27 DIAGNOSIS — J90 Pleural effusion, not elsewhere classified: Secondary | ICD-10-CM | POA: Diagnosis not present

## 2016-01-27 DIAGNOSIS — R809 Proteinuria, unspecified: Secondary | ICD-10-CM

## 2016-01-27 DIAGNOSIS — R05 Cough: Secondary | ICD-10-CM | POA: Diagnosis not present

## 2016-01-27 DIAGNOSIS — R059 Cough, unspecified: Secondary | ICD-10-CM

## 2016-01-27 LAB — CBC WITH DIFFERENTIAL/PLATELET
BASO%: 0 % (ref 0.0–2.0)
Basophils Absolute: 0 10*3/uL (ref 0.0–0.1)
EOS ABS: 0 10*3/uL (ref 0.0–0.5)
EOS%: 0 % (ref 0.0–7.0)
HEMATOCRIT: 27.4 % — AB (ref 38.4–49.9)
HEMOGLOBIN: 8.9 g/dL — AB (ref 13.0–17.1)
LYMPH#: 0.8 10*3/uL — AB (ref 0.9–3.3)
LYMPH%: 5.1 % — ABNORMAL LOW (ref 14.0–49.0)
MCH: 22.2 pg — ABNORMAL LOW (ref 27.2–33.4)
MCHC: 32.5 g/dL (ref 32.0–36.0)
MCV: 68.3 fL — ABNORMAL LOW (ref 79.3–98.0)
MONO#: 0.6 10*3/uL (ref 0.1–0.9)
MONO%: 4 % (ref 0.0–14.0)
NEUT%: 90.9 % — ABNORMAL HIGH (ref 39.0–75.0)
NEUTROS ABS: 13.8 10*3/uL — AB (ref 1.5–6.5)
NRBC: 0 % (ref 0–0)
PLATELETS: 213 10*3/uL (ref 140–400)
RBC: 4.01 10*6/uL — ABNORMAL LOW (ref 4.20–5.82)
RDW: 19.9 % — AB (ref 11.0–14.6)
WBC: 15.2 10*3/uL — AB (ref 4.0–10.3)

## 2016-01-27 LAB — COMPREHENSIVE METABOLIC PANEL
ALBUMIN: 2.8 g/dL — AB (ref 3.5–5.0)
ALK PHOS: 118 U/L (ref 40–150)
ALT: 11 U/L (ref 0–55)
AST: 14 U/L (ref 5–34)
Anion Gap: 8 mEq/L (ref 3–11)
BUN: 33.7 mg/dL — ABNORMAL HIGH (ref 7.0–26.0)
CALCIUM: 8.9 mg/dL (ref 8.4–10.4)
CO2: 20 meq/L — AB (ref 22–29)
CREATININE: 1.9 mg/dL — AB (ref 0.7–1.3)
Chloride: 110 mEq/L — ABNORMAL HIGH (ref 98–109)
EGFR: 44 mL/min/{1.73_m2} — AB (ref >90)
Glucose: 250 mg/dl — ABNORMAL HIGH (ref 70–140)
Potassium: 5.2 mEq/L — ABNORMAL HIGH (ref 3.5–5.1)
Sodium: 138 mEq/L (ref 136–145)
TOTAL PROTEIN: 6.3 g/dL — AB (ref 6.4–8.3)

## 2016-01-27 LAB — UA PROTEIN, DIPSTICK - CHCC: PROTEIN: 300 mg/dL

## 2016-01-27 MED ORDER — BENZONATATE 100 MG PO CAPS: 100.0000 mg | ORAL_CAPSULE | Freq: Three times a day (TID) | ORAL | 0 refills | Status: DC | PRN

## 2016-01-27 MED ORDER — SODIUM CHLORIDE 0.9 % IV SOLN: 10.0000 mg/kg | Freq: Once | INTRAVENOUS | Status: DC

## 2016-01-27 MED ORDER — ACETAMINOPHEN 325 MG PO TABS
650.0000 mg | ORAL_TABLET | Freq: Once | ORAL | Status: AC
  Administered 2016-01-27: 650 mg via ORAL

## 2016-01-27 MED ORDER — SODIUM CHLORIDE 0.9% FLUSH
10.0000 mL | INTRAVENOUS | Status: DC | PRN
  Administered 2016-01-27: 10 mL
  Filled 2016-01-27: qty 10

## 2016-01-27 MED ORDER — ACETAMINOPHEN 325 MG PO TABS
ORAL_TABLET | ORAL | Status: AC
  Filled 2016-01-27: qty 2

## 2016-01-27 MED ORDER — DIPHENHYDRAMINE HCL 50 MG/ML IJ SOLN
INTRAMUSCULAR | Status: AC
  Filled 2016-01-27: qty 1

## 2016-01-27 MED ORDER — HEPARIN SOD (PORK) LOCK FLUSH 100 UNIT/ML IV SOLN
500.0000 [IU] | Freq: Once | INTRAVENOUS | Status: AC | PRN
  Administered 2016-01-27: 500 [IU]
  Filled 2016-01-27: qty 5

## 2016-01-27 MED ORDER — DIPHENHYDRAMINE HCL 50 MG/ML IJ SOLN
50.0000 mg | Freq: Once | INTRAMUSCULAR | Status: AC
  Administered 2016-01-27: 50 mg via INTRAVENOUS

## 2016-01-27 MED ORDER — SODIUM CHLORIDE 0.9 % IV SOLN
Freq: Once | INTRAVENOUS | Status: AC
  Administered 2016-01-27: 13:00:00 via INTRAVENOUS

## 2016-01-27 MED ORDER — DEXAMETHASONE SODIUM PHOSPHATE 10 MG/ML IJ SOLN
INTRAMUSCULAR | Status: AC
  Filled 2016-01-27: qty 1

## 2016-01-27 MED ORDER — DEXAMETHASONE SODIUM PHOSPHATE 10 MG/ML IJ SOLN
10.0000 mg | Freq: Once | INTRAMUSCULAR | Status: AC
  Administered 2016-01-27: 10 mg via INTRAVENOUS

## 2016-01-27 MED ORDER — SODIUM CHLORIDE 0.9 % IV SOLN
75.0000 mg/m2 | Freq: Once | INTRAVENOUS | Status: AC
  Administered 2016-01-27: 150 mg via INTRAVENOUS
  Filled 2016-01-27: qty 15

## 2016-01-27 NOTE — Patient Instructions (Addendum)
[facility] Discharge Instructions for Patients Receiving Chemotherapy  Today you received the following chemotherapy agents: Cyramza and Taxotere.  To help prevent nausea and vomiting after your treatment, we encourage you to take your nausea medication as prescribed. If you develop nausea and vomiting that is not controlled by your nausea medication, call the clinic.   BELOW ARE SYMPTOMS THAT SHOULD BE REPORTED IMMEDIATELY:  *FEVER GREATER THAN 100.5 F  *CHILLS WITH OR WITHOUT FEVER  NAUSEA AND VOMITING THAT IS NOT CONTROLLED WITH YOUR NAUSEA MEDICATION  *UNUSUAL SHORTNESS OF BREATH  *UNUSUAL BRUISING OR BLEEDING  TENDERNESS IN MOUTH AND THROAT WITH OR WITHOUT PRESENCE OF ULCERS  *URINARY PROBLEMS  *BOWEL PROBLEMS  UNUSUAL RASH Items with * indicate a potential emergency and should be followed up as soon as possible.  Feel free to call the clinic you have any questions or concerns. The clinic phone number is [phone].  Please show the [facility] at check-in to the Emergency Department and triage nurse.

## 2016-01-27 NOTE — Progress Notes (Signed)
Urine protein resulted at 300 today - MD Mohamed ordered US to hold Cyramza but give TAxotere today.

## 2016-01-27 NOTE — Progress Notes (Signed)
Per mohamed it is okay to treat pt today with chemo and cyramza and today cmp results.

## 2016-01-27 NOTE — Progress Notes (Signed)
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 Status post 6 cycles with partial response. 2) Nivolumab 240 MG IV every 2 weeks status post 4 cycles, last dose was given 03/18/2015 discontinued today secondary to disease progression.  CURRENT THERAPY: Systemic chemotherapy with docetaxel 75 MG/M2 and Cyramza 10 MG/KG every 3 weeks. Status post 12 cycles. First dose was given 04/08/2015.  INTERVAL HISTORY: Keith Holloway 55 y.o. male returns to the clinic today for follow-up visit accompanied by his wife. The patient continues to complain of pain on the right side of the chest as well as shortness of breath with exertion and cough productive of whitish sputum. He denied having any hemoptysis. He lost around 5 pounds since his last visit. He had left pleural effusion and he was referred for ultrasound-guided left thoracentesis but today and only around 20 mL of pleural fluid secondary to loculation. His blood pressure is still elevated but usually normal at home. He is requesting refill of his cough medication. He has no fever or chills today. He has no nausea or vomiting. He has no bleeding, bruises or ecchymosis.  MEDICAL HISTORY: Past Medical History:  Diagnosis Date  . Acute hyperglycemia 11/26/2015  . Alcohol abuse   . Arthritis   . Borderline hypertension   . CHF (congestive heart failure) ([place])   . CKD (chronic kidney disease), stage III   . Cocaine abuse    last [employer] post[employer] for cocaine 09-30-2015  . Depression   . Dyspnea on effort   . Elevated blood sugar 11/26/2015  . History of acute renal  failure    09-30-2015  . History of hyperkalemia    admission 09-30-2015 potassiom 8.0  with medications currently 10-14-2015 down to 4.7 normal  . History of traumatic head injury    1987  w/ LOC   --- no residual  . Hyperlipidemia   . Hypertension 12/16/2015  . 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 Memory disorder 08/22/2013   alcohol abuse  . Migraine   . Nocturia   . Non-small cell carcinoma of lung, stage 4 [facility]) oncologist-  dr Julien Nordmann   w/ METs to liver dx 04/ 2016--  Stage IV (T2a, N3, M1b), squamous cell carcinoma of left lower lobe---  systemic chemotherapy every 3 weeks and currently due to evidence for disease progression has started immunotherapy  . Pilonidal abscess    recurrent  . Pleural effusion, bilateral    per ct 10-09-2015  new small right effusion and left small to moderate effusion , increased  . Polyneuropathy in diabetes(357.2)   . Port-a-cath in place   . Pulmonary nodules/lesions, multiple    pt has stage 4 lung cancer  . Recurrent left pleural effusion 01/06/2016  . Tobacco abuse counseling 10/09/2014  . Type 2 diabetes mellitus with insulin therapy ([place])   . Wears dentures    upper    ALLERGIES:  has No Known Allergies.  MEDICATIONS:  Current Outpatient Prescriptions  Medication Sig Dispense Refill  . acetaminophen (TYLENOL) 325 MG tablet  Take 2 tablets (650 mg total) by mouth every 6 (six) hours as needed for mild pain (or Fever >/= 101). 30 tablet 0  . amLODipine (NORVASC) 10 MG tablet Take 1 tablet (10 mg total) by mouth daily. 30 tablet 1  . aspirin EC 81 MG tablet Take 1 tablet by mouth every morning.     . benzonatate (TESSALON) 100 MG capsule Take 1 capsule (100 mg total) by mouth 3 (three) times daily as needed for cough. 20 capsule 0  . Cholecalciferol (VITAMIN D-3) 1000 units CAPS Take 1,000 Units by mouth daily.    Marland Kitchen dexamethasone (DECADRON) 4 MG tablet 2 TABLETS BY MOUTH TWICE A DAY THE  DAY BEFORE, DAY OF AND DAY AFTER THE CHEMOTHERAPY EVERY 3 WEEKS 40 tablet 1  . docusate sodium (COLACE) 100 MG capsule Take 1 capsule (100 mg total) by mouth 2 (two) times daily. 40 capsule 0  . Ferrous Sulfate (IRON) 325 (65 Fe) MG TABS Take 1 tablet by mouth 2 (two) times daily.     Marland Kitchen ibuprofen (ADVIL,MOTRIN) 200 MG tablet Take 400 mg by mouth every 6 (six) hours as needed for moderate pain.    Marland Kitchen insulin NPH-regular Human (NOVOLIN 70/30) (70-30) 100 UNIT/ML injection Inject 35 Units into the skin 2 (two) times daily with a meal. (Patient taking differently: Inject 20 Units into the skin 2 (two) times daily with a meal. ONLY IF CBG IS OVER 200.) 10 mL 11  . lidocaine-prilocaine (EMLA) cream Apply 1 application topically as needed. 30 g 0  . simvastatin (ZOCOR) 40 MG tablet Take 1 tablet (40 mg total) by mouth daily. (Patient taking differently: Take 40 mg by mouth every morning. ) 30 tablet 3  . vitamin C (ASCORBIC ACID) 500 MG tablet Take 500 mg by mouth 2 (two) times daily.     Marland Kitchen HYDROcodone-acetaminophen (NORCO/VICODIN) 5-325 MG tablet Take 2 tablets by mouth every 6 (six) hours as needed. (Patient not taking: Reported on 01/27/2016) 30 tablet 0  . ondansetron (ZOFRAN) 8 MG tablet TAKE 1 TABLET EVERY 8 HOURS AS NEEDED FOR NAUSEA AND VOMITING (Patient not taking: Reported on 01/27/2016) 20 tablet 1  . prochlorperazine (COMPAZINE) 10 MG tablet TAKE 1 TABLET (10 MG TOTAL) BY MOUTH EVERY 6 (SIX) HOURS AS NEEDED FOR NAUSEA OR VOMITING. (Patient not taking: Reported on 01/27/2016) 30 tablet 0  . temazepam (RESTORIL) 30 MG capsule TAKE ONE CAPSULE AT BEDTIME (Patient not taking: Reported on 01/27/2016) 30 capsule 0   No current facility-administered medications for this visit.     SURGICAL HISTORY:  Past Surgical History:  Procedure Laterality Date  . CATARACT EXTRACTION, BILATERAL  08/2012  . PILONIDAL CYST EXCISION N/A 10/17/2015   Procedure: EXCISION PILONIDAL DISEASE;  Surgeon: Leighton Ruff, MD;   Location: [facility];  Service: General;  Laterality: N/A;  . PORTACATH PLACEMENT  01/30/2015  . RETINAL DETACHMENT SURGERY Bilateral 04/2012    REVIEW OF SYSTEMS:  Constitutional: positive for fatigue and weight loss Eyes: negative Ears, nose, mouth, throat, and face: negative Respiratory: positive for cough, dyspnea on exertion and pleurisy/chest pain Cardiovascular: negative Gastrointestinal: negative Genitourinary:negative for dysuria, frequency and hematuria Integument/breast: negative Hematologic/lymphatic: negative Musculoskeletal:positive for muscle weakness Neurological: negative Behavioral/Psych: negative Endocrine: negative Allergic/Immunologic: negative   PHYSICAL EXAMINATION: General appearance: alert, cooperative, fatigued and no distress Head: Normocephalic, without obvious abnormality, atraumatic Neck: no adenopathy, no JVD, supple, symmetrical, trachea midline and thyroid not enlarged, symmetric, no tenderness/mass/nodules Lymph nodes: Cervical, supraclavicular, and axillary nodes normal.  Resp: diminished breath sounds LLL and dullness to percussion LLL Back: negative, symmetric, no curvature. ROM normal. No CVA tenderness. Cardio: regular rate and rhythm, S1, S2 normal, no murmur, click, rub or gallop and normal apical impulse GI: soft, non-tender; bowel sounds normal; no masses,  no organomegaly Extremities: extremities normal, atraumatic, no cyanosis or edema Neurologic: Alert and oriented X 3, normal strength and tone. Normal symmetric reflexes. Normal coordination and gait  ECOG PERFORMANCE STATUS: 1 - Symptomatic but completely ambulatory  Blood pressure (!) 150/94, pulse 94, temperature 97.6 F (36.4 C), temperature source Oral, resp. rate 18, height '5\' 11"'$  (1.803 m), weight 185 lb 11.2 oz (84.2 kg), SpO2 99 %.  LABORATORY DATA: Lab Results  Component Value Date   WBC 15.2 (H) 01/27/2016   HGB 8.9 (L) 01/27/2016   HCT 27.4 (L)  01/27/2016   MCV 68.3 (L) 01/27/2016   PLT 213 01/27/2016      Chemistry      Component Value Date/Time   NA 138 01/27/2016 1052   K 5.2 (H) 01/27/2016 1052   CL 111 11/19/2015 0448   CO2 20 (L) 01/27/2016 1052   BUN 33.7 (H) 01/27/2016 1052   CREATININE 1.9 (H) 01/27/2016 1052   GLU 396 (H) 10/28/2015 1249      Component Value Date/Time   CALCIUM 8.9 01/27/2016 1052   ALKPHOS 118 01/27/2016 1052   AST 14 01/27/2016 1052   ALT 11 01/27/2016 1052   BILITOT <0.22 01/27/2016 1052       RADIOGRAPHIC STUDIES: Ct Abdomen Pelvis Wo Contrast  Result Date: 01/01/2016 CLINICAL DATA:  Followup stage IV left lung non-small cell carcinoma. Ongoing chemotherapy. Restaging. EXAM: CT CHEST, ABDOMEN AND PELVIS WITHOUT CONTRAST TECHNIQUE: Multidetector CT imaging of the chest, abdomen and pelvis was performed following the standard protocol without IV contrast. COMPARISON:  10/09/2015 FINDINGS: CT CHEST FINDINGS Cardiovascular: Normal heart size. Stable small pericardial effusion. Right-sided Port-A-Cath in appropriate position. Mediastinum/Lymph Nodes: Anterior mediastinal mass or lymphadenopathy currently measures 6.6 x 3.9 cm on image 28/2 compared to 5.9 x 3.0 cm previously. No new sites of lymphadenopathy visualized on this unenhanced exam. Lungs/Pleura: Moderate to large left pleural effusion has increased in size since prior study. Increased compressive atelectasis is seen involving the left lung. Mild right lower lobe scarring is stable. Previously seen tiny right pleural effusion has nearly completely resolved since prior study. Musculoskeletal: No suspicious bone lesions or other significant abnormality. CT ABDOMEN AND PELVIS FINDINGS Hepatobiliary: Several subtle ill-defined low-attenuation lesions are seen in the anterior dome of the right hepatic lobe and in the left hepatic lobe. Largest measures 2 cm in the lateral segment left lobe, and these were not definitely seen on previous  noncontrast study. Liver metastases cannot be excluded. Gallbladder is unremarkable. Pancreas: No mass or inflammatory changes identified on this unenhanced exam. Spleen: Within normal limits in size. Adrenals/Urinary Tract: No evidence of urolithiasis or hydronephrosis. Unremarkable appearance of bladder. Stomach/Bowel: No evidence of obstruction, inflammatory process, or abnormal fluid collections. Vascular/Lymphatic: No pathologically enlarged lymph nodes identified. No abdominal aortic aneurysm. Reproductive:  No mass or other significant abnormality identified. Other:  None. Musculoskeletal:  No suspicious bone lesions identified. IMPRESSION: Mild increase in size of anterior mediastinal mass or lymphadenopathy since prior exam. Increased moderate to large left pleural effusion and left lung compressive atelectasis. Stable small pericardial effusion. Subtle ill-defined low-attenuation liver lesions measuring up to 2 cm, not definitely seen on previous noncontrast study, and suspicious for liver metastases. Consider abdomen MRI  without and with contrast versus continued attention on follow-up CT. Electronically Signed   By: Earle Gell M.D.   On: 01/01/2016 10:14   Dg Chest 1 View  Result Date: 01/15/2016 CLINICAL DATA:  Thoracentesis. EXAM: CHEST 1 VIEW COMPARISON:  01/01/2016. FINDINGS: PowerPort catheter noted with lead tip projected over the right atrium. Cardiomegaly.No focal infiltrate. Prominent left pleural effusion. No pneumothorax. No acute bony abnormality. IMPRESSION: 1. PowerPort catheter with lead tip projected over the right atrium. 2.  Prominent left effusion.  No pneumothorax. 3. Cardiomegaly.  No pulmonary venous congestion. Electronically Signed   By: Marcello Moores  Register   On: 01/15/2016 15:16   Ct Chest Wo Contrast  Result Date: 01/01/2016 CLINICAL DATA:  Followup stage IV left lung non-small cell carcinoma. Ongoing chemotherapy. Restaging. EXAM: CT CHEST, ABDOMEN AND PELVIS WITHOUT  CONTRAST TECHNIQUE: Multidetector CT imaging of the chest, abdomen and pelvis was performed following the standard protocol without IV contrast. COMPARISON:  10/09/2015 FINDINGS: CT CHEST FINDINGS Cardiovascular: Normal heart size. Stable small pericardial effusion. Right-sided Port-A-Cath in appropriate position. Mediastinum/Lymph Nodes: Anterior mediastinal mass or lymphadenopathy currently measures 6.6 x 3.9 cm on image 28/2 compared to 5.9 x 3.0 cm previously. No new sites of lymphadenopathy visualized on this unenhanced exam. Lungs/Pleura: Moderate to large left pleural effusion has increased in size since prior study. Increased compressive atelectasis is seen involving the left lung. Mild right lower lobe scarring is stable. Previously seen tiny right pleural effusion has nearly completely resolved since prior study. Musculoskeletal: No suspicious bone lesions or other significant abnormality. CT ABDOMEN AND PELVIS FINDINGS Hepatobiliary: Several subtle ill-defined low-attenuation lesions are seen in the anterior dome of the right hepatic lobe and in the left hepatic lobe. Largest measures 2 cm in the lateral segment left lobe, and these were not definitely seen on previous noncontrast study. Liver metastases cannot be excluded. Gallbladder is unremarkable. Pancreas: No mass or inflammatory changes identified on this unenhanced exam. Spleen: Within normal limits in size. Adrenals/Urinary Tract: No evidence of urolithiasis or hydronephrosis. Unremarkable appearance of bladder. Stomach/Bowel: No evidence of obstruction, inflammatory process, or abnormal fluid collections. Vascular/Lymphatic: No pathologically enlarged lymph nodes identified. No abdominal aortic aneurysm. Reproductive:  No mass or other significant abnormality identified. Other:  None. Musculoskeletal:  No suspicious bone lesions identified. IMPRESSION: Mild increase in size of anterior mediastinal mass or lymphadenopathy since prior exam.  Increased moderate to large left pleural effusion and left lung compressive atelectasis. Stable small pericardial effusion. Subtle ill-defined low-attenuation liver lesions measuring up to 2 cm, not definitely seen on previous noncontrast study, and suspicious for liver metastases. Consider abdomen MRI without and with contrast versus continued attention on follow-up CT. Electronically Signed   By: Earle Gell M.D.   On: 01/01/2016 10:14   US Thoracentesis Asp Pleural Space W/img Guide  Result Date: 01/15/2016 INDICATION: Left lung cancer, left pleural effusion. Request made for therapeutic left thoracentesis. EXAM: ULTRASOUND GUIDED THERAPEUTIC LEFT THORACENTESIS MEDICATIONS: None. COMPLICATIONS: None immediate. PROCEDURE: An ultrasound guided thoracentesis was thoroughly discussed with the patient and questions answered. The benefits, risks, alternatives and complications were also discussed. The patient understands and wishes to proceed with the procedure. Written consent was obtained. Ultrasound was performed to localize and mark an adequate pocket of fluid in the left chest. The area was then prepped and draped in the normal sterile fashion. 1% Lidocaine was used for local anesthesia. Under ultrasound guidance a Safe-T-Centesis/Yueh catheter was introduced. Thoracentesis was performed. The catheter was removed and a dressing  applied. FINDINGS: A total of approximately 20 cc of slightly hazy, yellow fluid was removed. Despite use of multiple access needles only the above amount of fluid could be removed from the multiloculated pleural space at this time. IMPRESSION: Successful ultrasound guided therapeutic left thoracentesis yielding 20 cc of pleural fluid. Patient may benefit from follow-up CT chest prior to re-attempt at drainage of pleural effusion. Read by: Rowe Matix, PA-C Electronically Signed   By: Marybelle Killings M.D.   On: 01/15/2016 16:24    ASSESSMENT AND PLAN: This is a very pleasant 55 years  old African-American male with: 1) metastatic non-small cell lung cancer, squamous cell carcinoma status post several chemotherapy regimens and he is currently on treatment with docetaxel and Cyramza status post 12 cycles. The patient is tolerating his treatment well with no significant adverse effects. I recommended for him to proceed with cycle #16 today as a scheduled. 2) hypertension: It is usually elevated when he comes to the clinic. It is much better at home. I recommended for the patient to continue with his blood pressure medication. 3) left pleural effusion: It is loculated and ultrasound-guided thoracentesis did not drain much fluid. 4) diabetes mellitus: Not well-controlled. I recommended for the patient to take his antidiabetic medications and to reconsult with his primary care physician regarding adjustment of his doses. It is usually worse when he takes Decadron premedication. 5) pain management: He will continue on Vicodin for now. 6) cough: I gave the patient refill of Tessalon. 7) hyperkalemia: Very mild. We'll continue to monitor for now. He would come back for follow-up visit in 3 weeks for reevaluation before starting cycle #13 of his treatment. The patient voices understanding of current disease status and treatment options and is in agreement with the current care plan.  All questions were answered. The patient knows to call the clinic with any problems, questions or concerns. We can certainly see the patient much sooner if necessary.  Disclaimer: This note was dictated with voice recognition software. Similar sounding words can inadvertently be transcribed and may not be corrected upon review.

## 2016-01-27 NOTE — Telephone Encounter (Signed)
Message sent to chemo scheduler to be added per 01/27/16 los. Appointments scheduled per 01/27/16 los. A copy of the AVS report and appointment schedule was given to the patient per 01/27/16 los.

## 2016-01-27 NOTE — Progress Notes (Signed)
Dr. Earlie Server aware that urine protein from 12/5 was 2000 and treatment is being held today UNTIL a new urine is collected and resulted. Will only treat if urine protein is below 100 today per MD Mohammed.

## 2016-01-28 ENCOUNTER — Telehealth: Payer: Self-pay | Admitting: *Deleted

## 2016-01-28 NOTE — Telephone Encounter (Signed)
Per LOS I have scheduled appts and notified scheduler 

## 2016-01-29 ENCOUNTER — Ambulatory Visit (HOSPITAL_BASED_OUTPATIENT_CLINIC_OR_DEPARTMENT_OTHER): Payer: Medicare Other

## 2016-01-29 VITALS — BP 160/88 | HR 98 | Temp 97.8°F | Resp 18

## 2016-01-29 DIAGNOSIS — C3492 Malignant neoplasm of unspecified part of left bronchus or lung: Secondary | ICD-10-CM

## 2016-01-29 DIAGNOSIS — C3432 Malignant neoplasm of lower lobe, left bronchus or lung: Secondary | ICD-10-CM

## 2016-01-29 MED ORDER — PEGFILGRASTIM INJECTION 6 MG/0.6ML ~~LOC~~
6.0000 mg | PREFILLED_SYRINGE | Freq: Once | SUBCUTANEOUS | Status: AC
  Administered 2016-01-29: 6 mg via SUBCUTANEOUS
  Filled 2016-01-29: qty 0.6

## 2016-02-03 ENCOUNTER — Other Ambulatory Visit: Payer: Self-pay | Admitting: Medical Oncology

## 2016-02-03 ENCOUNTER — Other Ambulatory Visit (HOSPITAL_BASED_OUTPATIENT_CLINIC_OR_DEPARTMENT_OTHER): Payer: Medicare Other

## 2016-02-03 DIAGNOSIS — C3492 Malignant neoplasm of unspecified part of left bronchus or lung: Secondary | ICD-10-CM

## 2016-02-03 DIAGNOSIS — C3432 Malignant neoplasm of lower lobe, left bronchus or lung: Secondary | ICD-10-CM

## 2016-02-03 DIAGNOSIS — R809 Proteinuria, unspecified: Secondary | ICD-10-CM

## 2016-02-03 LAB — CBC WITH DIFFERENTIAL/PLATELET
BASO%: 0.6 % (ref 0.0–2.0)
BASOS ABS: 0 10*3/uL (ref 0.0–0.1)
EOS%: 0.8 % (ref 0.0–7.0)
Eosinophils Absolute: 0.1 10*3/uL (ref 0.0–0.5)
HCT: 26.3 % — ABNORMAL LOW (ref 38.4–49.9)
HEMOGLOBIN: 8.3 g/dL — AB (ref 13.0–17.1)
LYMPH#: 1.2 10*3/uL (ref 0.9–3.3)
LYMPH%: 19.1 % (ref 14.0–49.0)
MCH: 22 pg — ABNORMAL LOW (ref 27.2–33.4)
MCHC: 31.6 g/dL — ABNORMAL LOW (ref 32.0–36.0)
MCV: 69.8 fL — ABNORMAL LOW (ref 79.3–98.0)
MONO#: 1.3 10*3/uL — ABNORMAL HIGH (ref 0.1–0.9)
MONO%: 20.7 % — AB (ref 0.0–14.0)
NEUT%: 58.8 % (ref 39.0–75.0)
NEUTROS ABS: 3.7 10*3/uL (ref 1.5–6.5)
NRBC: 0 % (ref 0–0)
Platelets: 72 10*3/uL — ABNORMAL LOW (ref 140–400)
RBC: 3.77 10*6/uL — AB (ref 4.20–5.82)
RDW: 19.7 % — AB (ref 11.0–14.6)
WBC: 6.3 10*3/uL (ref 4.0–10.3)

## 2016-02-03 LAB — COMPREHENSIVE METABOLIC PANEL
ALBUMIN: 2.5 g/dL — AB (ref 3.5–5.0)
ALK PHOS: 136 U/L (ref 40–150)
ALT: 17 U/L (ref 0–55)
AST: 17 U/L (ref 5–34)
Anion Gap: 5 mEq/L (ref 3–11)
BILIRUBIN TOTAL: 0.45 mg/dL (ref 0.20–1.20)
BUN: 25.3 mg/dL (ref 7.0–26.0)
CO2: 25 meq/L (ref 22–29)
CREATININE: 1.5 mg/dL — AB (ref 0.7–1.3)
Calcium: 8.6 mg/dL (ref 8.4–10.4)
Chloride: 112 mEq/L — ABNORMAL HIGH (ref 98–109)
EGFR: 59 mL/min/{1.73_m2} — ABNORMAL LOW (ref >90)
GLUCOSE: 297 mg/dL — AB (ref 70–140)
Potassium: 4.4 mEq/L (ref 3.5–5.1)
SODIUM: 142 meq/L (ref 136–145)
TOTAL PROTEIN: 5.9 g/dL — AB (ref 6.4–8.3)

## 2016-02-10 ENCOUNTER — Other Ambulatory Visit (HOSPITAL_BASED_OUTPATIENT_CLINIC_OR_DEPARTMENT_OTHER): Payer: Medicare Other

## 2016-02-10 DIAGNOSIS — C3432 Malignant neoplasm of lower lobe, left bronchus or lung: Secondary | ICD-10-CM | POA: Diagnosis present

## 2016-02-10 DIAGNOSIS — C3492 Malignant neoplasm of unspecified part of left bronchus or lung: Secondary | ICD-10-CM

## 2016-02-10 DIAGNOSIS — Z79899 Other long term (current) drug therapy: Secondary | ICD-10-CM | POA: Diagnosis not present

## 2016-02-10 LAB — CBC WITH DIFFERENTIAL/PLATELET
BASO%: 0.3 % (ref 0.0–2.0)
Basophils Absolute: 0.1 10*3/uL (ref 0.0–0.1)
EOS%: 0.1 % (ref 0.0–7.0)
Eosinophils Absolute: 0 10*3/uL (ref 0.0–0.5)
HCT: 28.2 % — ABNORMAL LOW (ref 38.4–49.9)
HGB: 8.9 g/dL — ABNORMAL LOW (ref 13.0–17.1)
LYMPH%: 8.9 % — AB (ref 14.0–49.0)
MCH: 22.5 pg — ABNORMAL LOW (ref 27.2–33.4)
MCHC: 31.6 g/dL — AB (ref 32.0–36.0)
MCV: 71.1 fL — ABNORMAL LOW (ref 79.3–98.0)
MONO#: 1.4 10*3/uL — AB (ref 0.1–0.9)
MONO%: 6.5 % (ref 0.0–14.0)
NEUT#: 18.2 10*3/uL — ABNORMAL HIGH (ref 1.5–6.5)
NEUT%: 84.2 % — AB (ref 39.0–75.0)
PLATELETS: 161 10*3/uL (ref 140–400)
RBC: 3.96 10*6/uL — AB (ref 4.20–5.82)
RDW: 19.4 % — ABNORMAL HIGH (ref 11.0–14.6)
WBC: 21.6 10*3/uL — ABNORMAL HIGH (ref 4.0–10.3)
lymph#: 1.9 10*3/uL (ref 0.9–3.3)

## 2016-02-10 LAB — COMPREHENSIVE METABOLIC PANEL
ALT: 14 U/L (ref 0–55)
ANION GAP: 7 meq/L (ref 3–11)
AST: 18 U/L (ref 5–34)
Albumin: 2.7 g/dL — ABNORMAL LOW (ref 3.5–5.0)
Alkaline Phosphatase: 214 U/L — ABNORMAL HIGH (ref 40–150)
BUN: 23 mg/dL (ref 7.0–26.0)
CHLORIDE: 112 meq/L — AB (ref 98–109)
CO2: 25 meq/L (ref 22–29)
CREATININE: 1.5 mg/dL — AB (ref 0.7–1.3)
Calcium: 8.8 mg/dL (ref 8.4–10.4)
EGFR: 60 mL/min/{1.73_m2} — ABNORMAL LOW (ref >90)
Glucose: 153 mg/dl — ABNORMAL HIGH (ref 70–140)
Potassium: 4.4 mEq/L (ref 3.5–5.1)
SODIUM: 144 meq/L (ref 136–145)
Total Bilirubin: 0.25 mg/dL (ref 0.20–1.20)
Total Protein: 6.4 g/dL (ref 6.4–8.3)

## 2016-02-10 LAB — UA PROTEIN, DIPSTICK - CHCC: Protein, ur: 2000 mg/dL

## 2016-02-13 ENCOUNTER — Other Ambulatory Visit: Payer: Self-pay

## 2016-02-13 DIAGNOSIS — C3492 Malignant neoplasm of unspecified part of left bronchus or lung: Secondary | ICD-10-CM

## 2016-02-17 ENCOUNTER — Other Ambulatory Visit: Payer: Self-pay | Admitting: Internal Medicine

## 2016-02-17 ENCOUNTER — Other Ambulatory Visit (HOSPITAL_BASED_OUTPATIENT_CLINIC_OR_DEPARTMENT_OTHER): Payer: Medicare Other

## 2016-02-17 ENCOUNTER — Encounter: Payer: Self-pay | Admitting: Internal Medicine

## 2016-02-17 ENCOUNTER — Ambulatory Visit (HOSPITAL_BASED_OUTPATIENT_CLINIC_OR_DEPARTMENT_OTHER): Payer: Medicare Other | Admitting: Internal Medicine

## 2016-02-17 ENCOUNTER — Telehealth: Payer: Self-pay | Admitting: Internal Medicine

## 2016-02-17 ENCOUNTER — Ambulatory Visit (HOSPITAL_BASED_OUTPATIENT_CLINIC_OR_DEPARTMENT_OTHER): Payer: Medicare Other

## 2016-02-17 VITALS — BP 163/84 | HR 104 | Temp 97.6°F | Resp 18 | Wt 181.4 lb

## 2016-02-17 VITALS — BP 177/90 | HR 79

## 2016-02-17 DIAGNOSIS — Z79899 Other long term (current) drug therapy: Secondary | ICD-10-CM | POA: Diagnosis not present

## 2016-02-17 DIAGNOSIS — R634 Abnormal weight loss: Secondary | ICD-10-CM

## 2016-02-17 DIAGNOSIS — I10 Essential (primary) hypertension: Secondary | ICD-10-CM

## 2016-02-17 DIAGNOSIS — Z5111 Encounter for antineoplastic chemotherapy: Secondary | ICD-10-CM

## 2016-02-17 DIAGNOSIS — E1139 Type 2 diabetes mellitus with other diabetic ophthalmic complication: Secondary | ICD-10-CM | POA: Diagnosis not present

## 2016-02-17 DIAGNOSIS — R739 Hyperglycemia, unspecified: Secondary | ICD-10-CM

## 2016-02-17 DIAGNOSIS — C3492 Malignant neoplasm of unspecified part of left bronchus or lung: Secondary | ICD-10-CM

## 2016-02-17 DIAGNOSIS — Z794 Long term (current) use of insulin: Secondary | ICD-10-CM

## 2016-02-17 DIAGNOSIS — C3432 Malignant neoplasm of lower lobe, left bronchus or lung: Secondary | ICD-10-CM | POA: Diagnosis present

## 2016-02-17 DIAGNOSIS — Z716 Tobacco abuse counseling: Secondary | ICD-10-CM

## 2016-02-17 HISTORY — DX: Hyperglycemia, unspecified: R73.9

## 2016-02-17 LAB — CBC WITH DIFFERENTIAL/PLATELET
BASO%: 0.1 % (ref 0.0–2.0)
BASOS ABS: 0 10*3/uL (ref 0.0–0.1)
EOS ABS: 0 10*3/uL (ref 0.0–0.5)
EOS%: 0 % (ref 0.0–7.0)
HEMATOCRIT: 28.2 % — AB (ref 38.4–49.9)
HEMOGLOBIN: 9.1 g/dL — AB (ref 13.0–17.1)
LYMPH#: 1.1 10*3/uL (ref 0.9–3.3)
LYMPH%: 4.9 % — ABNORMAL LOW (ref 14.0–49.0)
MCH: 22.4 pg — ABNORMAL LOW (ref 27.2–33.4)
MCHC: 32.3 g/dL (ref 32.0–36.0)
MCV: 69.3 fL — AB (ref 79.3–98.0)
MONO#: 1.4 10*3/uL — AB (ref 0.1–0.9)
MONO%: 6.1 % (ref 0.0–14.0)
NEUT#: 19.9 10*3/uL — ABNORMAL HIGH (ref 1.5–6.5)
NEUT%: 88.9 % — ABNORMAL HIGH (ref 39.0–75.0)
NRBC: 0 % (ref 0–0)
PLATELETS: 289 10*3/uL (ref 140–400)
RBC: 4.07 10*6/uL — ABNORMAL LOW (ref 4.20–5.82)
RDW: 19.3 % — AB (ref 11.0–14.6)
WBC: 22.3 10*3/uL — ABNORMAL HIGH (ref 4.0–10.3)

## 2016-02-17 LAB — COMPREHENSIVE METABOLIC PANEL
ALBUMIN: 2.7 g/dL — AB (ref 3.5–5.0)
ALK PHOS: 139 U/L (ref 40–150)
ALT: 11 U/L (ref 0–55)
AST: 14 U/L (ref 5–34)
Anion Gap: 11 mEq/L (ref 3–11)
BUN: 33.6 mg/dL — ABNORMAL HIGH (ref 7.0–26.0)
CALCIUM: 9.1 mg/dL (ref 8.4–10.4)
CO2: 20 mEq/L — ABNORMAL LOW (ref 22–29)
Chloride: 106 mEq/L (ref 98–109)
Creatinine: 1.7 mg/dL — ABNORMAL HIGH (ref 0.7–1.3)
EGFR: 50 mL/min/{1.73_m2} — AB (ref >90)
GLUCOSE: 462 mg/dL — AB (ref 70–140)
Potassium: 5.1 mEq/L (ref 3.5–5.1)
SODIUM: 137 meq/L (ref 136–145)
Total Protein: 6.9 g/dL (ref 6.4–8.3)

## 2016-02-17 LAB — UA PROTEIN, DIPSTICK - CHCC: Protein, ur: 300 mg/dL

## 2016-02-17 MED ORDER — INSULIN REGULAR BOLUS VIA INFUSION: 15.0000 [IU] | Freq: Once | INTRAVENOUS | Status: DC

## 2016-02-17 MED ORDER — DEXAMETHASONE SODIUM PHOSPHATE 10 MG/ML IJ SOLN
INTRAMUSCULAR | Status: AC
  Filled 2016-02-17: qty 1

## 2016-02-17 MED ORDER — SODIUM CHLORIDE 0.9 % IV SOLN
Freq: Once | INTRAVENOUS | Status: AC
  Administered 2016-02-17: 12:00:00 via INTRAVENOUS

## 2016-02-17 MED ORDER — SODIUM CHLORIDE 0.9 % IV SOLN
75.0000 mg/m2 | Freq: Once | INTRAVENOUS | Status: AC
  Administered 2016-02-17: 150 mg via INTRAVENOUS
  Filled 2016-02-17: qty 15

## 2016-02-17 MED ORDER — DIPHENHYDRAMINE HCL 50 MG/ML IJ SOLN
50.0000 mg | Freq: Once | INTRAMUSCULAR | Status: AC
  Administered 2016-02-17: 50 mg via INTRAVENOUS

## 2016-02-17 MED ORDER — ACETAMINOPHEN 325 MG PO TABS
ORAL_TABLET | ORAL | Status: AC
  Filled 2016-02-17: qty 2

## 2016-02-17 MED ORDER — HEPARIN SOD (PORK) LOCK FLUSH 100 UNIT/ML IV SOLN
500.0000 [IU] | Freq: Once | INTRAVENOUS | Status: AC | PRN
Start: 2016-02-17 — End: 2016-02-17
  Administered 2016-02-17: 500 [IU]
  Filled 2016-02-17: qty 5

## 2016-02-17 MED ORDER — INSULIN REGULAR HUMAN 100 UNIT/ML IJ SOLN
15.0000 [IU] | Freq: Once | INTRAMUSCULAR | Status: AC
  Administered 2016-02-17: 15 [IU] via SUBCUTANEOUS
  Filled 2016-02-17: qty 0.15

## 2016-02-17 MED ORDER — DIPHENHYDRAMINE HCL 50 MG/ML IJ SOLN
INTRAMUSCULAR | Status: AC
  Filled 2016-02-17: qty 1

## 2016-02-17 MED ORDER — SODIUM CHLORIDE 0.9% FLUSH
10.0000 mL | INTRAVENOUS | Status: DC | PRN
  Administered 2016-02-17: 10 mL
  Filled 2016-02-17: qty 10

## 2016-02-17 MED ORDER — DEXAMETHASONE SODIUM PHOSPHATE 10 MG/ML IJ SOLN
10.0000 mg | Freq: Once | INTRAMUSCULAR | Status: AC
  Administered 2016-02-17: 10 mg via INTRAVENOUS

## 2016-02-17 MED ORDER — ACETAMINOPHEN 325 MG PO TABS
650.0000 mg | ORAL_TABLET | Freq: Once | ORAL | Status: AC
  Administered 2016-02-17: 650 mg via ORAL

## 2016-02-17 NOTE — Addendum Note (Signed)
Addended by: Neysa Hotter on: 02/17/2016 12:50 PM   Modules accepted: Orders

## 2016-02-17 NOTE — Progress Notes (Signed)
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  PRIOR THERAPY: 1) Systemic chemotherapy with carboplatin for AUC of 5 given on day 1 and gemcitabine 1000 MG/M2 on days 1 and 8 every 3 weeks. Status post 6 cycles with partial response. 2) Nivolumab 240 MG IV every 2 weeks status post 4 cycles, last dose was given 03/18/2015 discontinued today secondary to disease progression.  CURRENT THERAPY: Systemic chemotherapy with docetaxel 75 MG/M2 1 g at 10 MG/KG every 3 weeks, status post 13 cycles. First cycle was given 04/08/2015.  INTERVAL HISTORY: Keith Holloway 56 y.o. male returns to the clinic today for follow-up visit accompanied by his wife. The patient continues to complain of fatigue and weakness as well as pain in the left lower extremity started a week ago and improved with Tylenol. There is no swelling in the lower extremities. He also has persistent cough with wheezing. He has shortness breath with exertion. He denied having any hemoptysis. He lost around 7 pounds since his last visit. He also complains of constipation and he takes stool soft in her at night time. He denied having any fever or chills. He has no nausea or vomiting. He is tolerating his current systemic chemotherapy fairly well. He is here today for evaluation before starting cycle #14.  MEDICAL HISTORY: Past Medical History:  Diagnosis Date  . Acute hyperglycemia 11/26/2015  . Alcohol abuse   . Arthritis   . Borderline hypertension   . CHF (congestive heart failure) ([place])   . CKD (chronic kidney disease), stage III   . Cocaine abuse    last [employer] post[employer] for cocaine 09-30-2015  . Depression   . Dyspnea on effort   . Elevated blood sugar 11/26/2015  . History of acute  renal failure    09-30-2015  . History of hyperkalemia    admission 09-30-2015 potassiom 8.0  with medications currently 10-14-2015 down to 4.7 normal  . History of traumatic head injury    1987  w/ LOC   --- no residual  . Hyperlipidemia   . Hypertension 12/16/2015  . 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 Memory disorder 08/22/2013   alcohol abuse  . Migraine   . Nocturia   . Non-small cell carcinoma of lung, stage 4 [facility]) oncologist-  dr Julien Nordmann   w/ METs to liver dx 04/ 2016--  Stage IV (T2a, N3, M1b), squamous cell carcinoma of left lower lobe---  systemic chemotherapy every 3 weeks and currently due to evidence for disease progression has started immunotherapy  . Pilonidal abscess    recurrent  . Pleural effusion, bilateral    per ct 10-09-2015  new small right effusion and left small to moderate effusion , increased  . Polyneuropathy in diabetes(357.2)   . Port-a-cath in place   . Pulmonary nodules/lesions, multiple    pt has stage 4 lung cancer  . Recurrent left pleural effusion 01/06/2016  . Tobacco abuse counseling 10/09/2014  . Type 2 diabetes mellitus with insulin therapy ([place])   . Wears dentures    upper    ALLERGIES:  has No Known Allergies.  MEDICATIONS:  Current Outpatient Prescriptions  Medication Sig Dispense Refill  . acetaminophen (TYLENOL) 325 MG  tablet Take 2 tablets (650 mg total) by mouth every 6 (six) hours as needed for mild pain (or Fever >/= 101). 30 tablet 0  . amLODipine (NORVASC) 10 MG tablet Take 1 tablet (10 mg total) by mouth daily. 30 tablet 1  . aspirin EC 81 MG tablet Take 1 tablet by mouth every morning.     . benzonatate (TESSALON) 100 MG capsule Take 1 capsule (100 mg total) by mouth 3 (three) times daily as needed for cough. 20 capsule 0  . Cholecalciferol (VITAMIN D-3) 1000 units CAPS Take 1,000 Units by mouth daily.    Marland Kitchen dexamethasone (DECADRON) 4 MG tablet 2 TABLETS BY MOUTH TWICE A  DAY THE DAY BEFORE, DAY OF AND DAY AFTER THE CHEMOTHERAPY EVERY 3 WEEKS 40 tablet 1  . docusate sodium (COLACE) 100 MG capsule Take 1 capsule (100 mg total) by mouth 2 (two) times daily. 40 capsule 0  . Ferrous Sulfate (IRON) 325 (65 Fe) MG TABS Take 1 tablet by mouth 2 (two) times daily.     Marland Kitchen ibuprofen (ADVIL,MOTRIN) 200 MG tablet Take 400 mg by mouth every 6 (six) hours as needed for moderate pain.    Marland Kitchen insulin NPH-regular Human (NOVOLIN 70/30) (70-30) 100 UNIT/ML injection Inject 35 Units into the skin 2 (two) times daily with a meal. (Patient taking differently: Inject 20 Units into the skin 2 (two) times daily with a meal. ONLY IF CBG IS OVER 200.) 10 mL 11  . lidocaine-prilocaine (EMLA) cream Apply 1 application topically as needed. 30 g 0  . simvastatin (ZOCOR) 40 MG tablet Take 1 tablet (40 mg total) by mouth daily. (Patient taking differently: Take 40 mg by mouth every morning. ) 30 tablet 3  . vitamin C (ASCORBIC ACID) 500 MG tablet Take 500 mg by mouth 2 (two) times daily.     Marland Kitchen HYDROcodone-acetaminophen (NORCO/VICODIN) 5-325 MG tablet Take 2 tablets by mouth every 6 (six) hours as needed. (Patient not taking: Reported on 02/17/2016) 30 tablet 0  . ondansetron (ZOFRAN) 8 MG tablet TAKE 1 TABLET EVERY 8 HOURS AS NEEDED FOR NAUSEA AND VOMITING (Patient not taking: Reported on 02/17/2016) 20 tablet 1  . prochlorperazine (COMPAZINE) 10 MG tablet TAKE 1 TABLET (10 MG TOTAL) BY MOUTH EVERY 6 (SIX) HOURS AS NEEDED FOR NAUSEA OR VOMITING. (Patient not taking: Reported on 02/17/2016) 30 tablet 0  . temazepam (RESTORIL) 30 MG capsule TAKE ONE CAPSULE AT BEDTIME (Patient not taking: Reported on 02/17/2016) 30 capsule 0   No current facility-administered medications for this visit.     SURGICAL HISTORY:  Past Surgical History:  Procedure Laterality Date  . CATARACT EXTRACTION, BILATERAL  08/2012  . PILONIDAL CYST EXCISION N/A 10/17/2015   Procedure: EXCISION PILONIDAL DISEASE;  Surgeon: Leighton Ruff, MD;   Location: [facility];  Service: General;  Laterality: N/A;  . PORTACATH PLACEMENT  01/30/2015  . RETINAL DETACHMENT SURGERY Bilateral 04/2012    REVIEW OF SYSTEMS:  Constitutional: positive for anorexia, fatigue and weight loss Eyes: negative Ears, nose, mouth, throat, and face: negative Respiratory: positive for cough, dyspnea on exertion and wheezing Cardiovascular: negative Gastrointestinal: positive for constipation Genitourinary:negative Integument/breast: negative Hematologic/lymphatic: negative Musculoskeletal:positive for arthralgias and muscle weakness Neurological: negative Behavioral/Psych: negative Endocrine: negative Allergic/Immunologic: negative   PHYSICAL EXAMINATION: General appearance: alert, cooperative, fatigued and no distress Head: Normocephalic, without obvious abnormality, atraumatic Neck: no adenopathy, no carotid bruit, no JVD, supple, symmetrical, trachea midline and thyroid not enlarged, symmetric, no tenderness/mass/nodules Lymph nodes: Cervical, supraclavicular, and  axillary nodes normal. Resp: clear to auscultation bilaterally Back: symmetric, no curvature. ROM normal. No CVA tenderness. Cardio: regular rate and rhythm, S1, S2 normal, no murmur, click, rub or gallop GI: soft, non-tender; bowel sounds normal; no masses,  no organomegaly Extremities: extremities normal, atraumatic, no cyanosis or edema Neurologic: Alert and oriented X 3, normal strength and tone. Normal symmetric reflexes. Normal coordination and gait  ECOG PERFORMANCE STATUS: 1 - Symptomatic but completely ambulatory  Blood pressure (!) 163/84, pulse (!) 104, temperature 97.6 F (36.4 C), temperature source Oral, resp. rate 18, weight 181 lb 6.4 oz (82.3 kg), SpO2 99 %.  LABORATORY DATA: Lab Results  Component Value Date   WBC 22.3 (H) 02/17/2016   HGB 9.1 (L) 02/17/2016   HCT 28.2 (L) 02/17/2016   MCV 69.3 (L) 02/17/2016   PLT 289 02/17/2016      Chemistry       Component Value Date/Time   NA 144 02/10/2016 1007   K 4.4 02/10/2016 1007   CL 111 11/19/2015 0448   CO2 25 02/10/2016 1007   BUN 23.0 02/10/2016 1007   CREATININE 1.5 (H) 02/10/2016 1007   GLU 396 (H) 10/28/2015 1249      Component Value Date/Time   CALCIUM 8.8 02/10/2016 1007   ALKPHOS 214 (H) 02/10/2016 1007   AST 18 02/10/2016 1007   ALT 14 02/10/2016 1007   BILITOT 0.25 02/10/2016 1007       RADIOGRAPHIC STUDIES: No results found.  ASSESSMENT AND PLAN: This is a very pleasant 56 years old African-American male with: 1) stage IV non-small cell lung cancer, squamous cell carcinoma diagnosed in April 2016 and currently undergoing systemic chemotherapy with docetaxel and Cyramza status post 13 cycles. The patient is tolerating his treatment well I recommended for him to continue his treatment but only with docetaxel today secondary to significant proteinuria. He would come back for follow-up visit in 3 weeks for evaluation with repeat CT scan of the chest, abdomen and pelvis for restaging of his disease. 2) hypertension: The patient was advised to take his blood pressure medication as prescribed. He mentioned that his blood pressure increases when he take the steroid premedication. 3) left lower extremity pain: Likely must the skeleton in origin. There is no swelling in the left lower extremity. We'll continue to monitor closely and consider the patient for repeat Doppler if needed to rule out the venous thrombosis. 4) weight loss: I strongly encouraged the patient to increase his by mouth intake. 5) constipation: He will continue on stool softener and recommended for the patient to start milk of magnesia if needed. The patient was advised to call immediately if he has any concerning symptoms in the interval. The patient voices understanding of current disease status and treatment options and is in agreement with the current care plan.  All questions were answered. The  patient knows to call the clinic with any problems, questions or concerns. We can certainly see the patient much sooner if necessary.  Disclaimer: This note was dictated with voice recognition software. Similar sounding words can inadvertently be transcribed and may not be corrected upon review.

## 2016-02-17 NOTE — Progress Notes (Signed)
Complained of feeling light headed after getting up in wheelchair, BP- 177/90 and pulse 79. Instructed to call for further problems and to get up slowly out of chairs, etc.

## 2016-02-17 NOTE — Patient Instructions (Signed)
[facility] Discharge Instructions for Patients Receiving Chemotherapy  Today you received the following chemotherapy agents: Taxotere.  To help prevent nausea and vomiting after your treatment, we encourage you to take your nausea medication as prescribed. If you develop nausea and vomiting that is not controlled by your nausea medication, call the clinic.   BELOW ARE SYMPTOMS THAT SHOULD BE REPORTED IMMEDIATELY:  *FEVER GREATER THAN 100.5 F  *CHILLS WITH OR WITHOUT FEVER  NAUSEA AND VOMITING THAT IS NOT CONTROLLED WITH YOUR NAUSEA MEDICATION  *UNUSUAL SHORTNESS OF BREATH  *UNUSUAL BRUISING OR BLEEDING  TENDERNESS IN MOUTH AND THROAT WITH OR WITHOUT PRESENCE OF ULCERS  *URINARY PROBLEMS  *BOWEL PROBLEMS  UNUSUAL RASH Items with * indicate a potential emergency and should be followed up as soon as possible.  Feel free to call the clinic you have any questions or concerns. The clinic phone number is [phone].  Please show the [facility] at check-in to the Emergency Department and triage nurse.

## 2016-02-17 NOTE — Telephone Encounter (Signed)
Appointments scheduled per 1/2 LOS. Patient given AVS report and calendars with future scheduled appointments. °

## 2016-02-17 NOTE — Progress Notes (Signed)
Per Dr Julien Nordmann pt is not getting cyramza today . He is getting taxotere. Per Julien Nordmann it is okay to treat pt today with taxotere and  creatinine of 1.7 . Per [facility] verbal order received to give regular insulin 15 units subcutaneous now for blood glucose of 462.

## 2016-02-19 ENCOUNTER — Ambulatory Visit (HOSPITAL_BASED_OUTPATIENT_CLINIC_OR_DEPARTMENT_OTHER): Payer: Medicare Other

## 2016-02-19 ENCOUNTER — Ambulatory Visit: Payer: Medicare Other

## 2016-02-19 DIAGNOSIS — C3432 Malignant neoplasm of lower lobe, left bronchus or lung: Secondary | ICD-10-CM

## 2016-02-19 DIAGNOSIS — C3492 Malignant neoplasm of unspecified part of left bronchus or lung: Secondary | ICD-10-CM

## 2016-02-19 MED ORDER — PEGFILGRASTIM INJECTION 6 MG/0.6ML ~~LOC~~
6.0000 mg | PREFILLED_SYRINGE | Freq: Once | SUBCUTANEOUS | Status: AC
  Administered 2016-02-19: 6 mg via SUBCUTANEOUS
  Filled 2016-02-19: qty 0.6

## 2016-02-19 NOTE — Patient Instructions (Signed)
Pegfilgrastim injection What is this medicine? PEGFILGRASTIM (PEG fil gra stim) is a long-acting granulocyte colony-stimulating factor that stimulates the growth of neutrophils, a type of white blood cell important in the body's fight against infection. It is used to reduce the incidence of fever and infection in patients with certain types of cancer who are receiving chemotherapy that affects the bone marrow, and to increase survival after being exposed to high doses of radiation. This medicine may be used for other purposes; ask your health care provider or pharmacist if you have questions. COMMON BRAND NAME(S): Neulasta What should I tell my health care provider before I take this medicine? They need to know if you have any of these conditions: -kidney disease -latex allergy -ongoing radiation therapy -sickle cell disease -skin reactions to acrylic adhesives (On-Body Injector only) -an unusual or allergic reaction to pegfilgrastim, filgrastim, other medicines, foods, dyes, or preservatives -pregnant or trying to get pregnant -breast-feeding How should I use this medicine? This medicine is for injection under the skin. If you get this medicine at home, you will be taught how to prepare and give the pre-filled syringe or how to use the On-body Injector. Refer to the patient Instructions for Use for detailed instructions. Use exactly as directed. Take your medicine at regular intervals. Do not take your medicine more often than directed. It is important that you put your used needles and syringes in a special sharps container. Do not put them in a trash can. If you do not have a sharps container, call your pharmacist or healthcare provider to get one. Talk to your pediatrician regarding the use of this medicine in children. While this drug may be prescribed for selected conditions, precautions do apply. Overdosage: If you think you have taken too much of this medicine contact a poison control  center or emergency room at once. NOTE: This medicine is only for you. Do not share this medicine with others. What if I miss a dose? It is important not to miss your dose. Call your doctor or health care professional if you miss your dose. If you miss a dose due to an On-body Injector failure or leakage, a new dose should be administered as soon as possible using a single prefilled syringe for manual use. What may interact with this medicine? Interactions have not been studied. Give your health care provider a list of all the medicines, herbs, non-prescription drugs, or dietary supplements you use. Also tell them if you smoke, drink alcohol, or use illegal drugs. Some items may interact with your medicine. This list may not describe all possible interactions. Give your health care provider a list of all the medicines, herbs, non-prescription drugs, or dietary supplements you use. Also tell them if you smoke, drink alcohol, or use illegal drugs. Some items may interact with your medicine. What should I watch for while using this medicine? You may need blood work done while you are taking this medicine. If you are going to need a MRI, CT scan, or other procedure, tell your doctor that you are using this medicine (On-Body Injector only). What side effects may I notice from receiving this medicine? Side effects that you should report to your doctor or health care professional as soon as possible: -allergic reactions like skin rash, itching or hives, swelling of the face, lips, or tongue -dizziness -fever -pain, redness, or irritation at site where injected -pinpoint red spots on the skin -red or dark-brown urine -shortness of breath or breathing problems -stomach or   side pain, or pain at the shoulder -swelling -tiredness -trouble passing urine or change in the amount of urine Side effects that usually do not require medical attention (report to your doctor or health care professional if they  continue or are bothersome): -bone pain -muscle pain This list may not describe all possible side effects. Call your doctor for medical advice about side effects. You may report side effects to FDA at 1-800-FDA-1088. Where should I keep my medicine? Keep out of the reach of children. Store pre-filled syringes in a refrigerator between 2 and 8 degrees C (36 and 46 degrees F). Do not freeze. Keep in carton to protect from light. Throw away this medicine if it is left out of the refrigerator for more than 48 hours. Throw away any unused medicine after the expiration date. NOTE: This sheet is a summary. It may not cover all possible information. If you have questions about this medicine, talk to your doctor, pharmacist, or health care provider.  2017 Elsevier/Gold Standard (2014-02-21 14:30:14)  

## 2016-02-19 NOTE — Progress Notes (Signed)
Reviewed labs with MD ok to proceed with Neulasta.

## 2016-02-24 ENCOUNTER — Other Ambulatory Visit (HOSPITAL_BASED_OUTPATIENT_CLINIC_OR_DEPARTMENT_OTHER): Payer: Medicare Other

## 2016-02-24 DIAGNOSIS — C3492 Malignant neoplasm of unspecified part of left bronchus or lung: Secondary | ICD-10-CM

## 2016-02-24 DIAGNOSIS — C3432 Malignant neoplasm of lower lobe, left bronchus or lung: Secondary | ICD-10-CM

## 2016-02-24 LAB — CBC WITH DIFFERENTIAL/PLATELET
BASO%: 0.3 % (ref 0.0–2.0)
BASOS ABS: 0 10*3/uL (ref 0.0–0.1)
EOS%: 0.4 % (ref 0.0–7.0)
Eosinophils Absolute: 0.1 10*3/uL (ref 0.0–0.5)
HEMATOCRIT: 26.4 % — AB (ref 38.4–49.9)
HEMOGLOBIN: 8.3 g/dL — AB (ref 13.0–17.1)
LYMPH%: 11.2 % — ABNORMAL LOW (ref 14.0–49.0)
MCH: 21.8 pg — AB (ref 27.2–33.4)
MCHC: 31.4 g/dL — ABNORMAL LOW (ref 32.0–36.0)
MCV: 69.5 fL — AB (ref 79.3–98.0)
MONO#: 1.9 10*3/uL — AB (ref 0.1–0.9)
MONO%: 16 % — ABNORMAL HIGH (ref 0.0–14.0)
NEUT#: 8.4 10*3/uL — ABNORMAL HIGH (ref 1.5–6.5)
NEUT%: 72.1 % (ref 39.0–75.0)
Platelets: 162 10*3/uL (ref 140–400)
RBC: 3.8 10*6/uL — ABNORMAL LOW (ref 4.20–5.82)
RDW: 19 % — ABNORMAL HIGH (ref 11.0–14.6)
WBC: 11.6 10*3/uL — ABNORMAL HIGH (ref 4.0–10.3)
lymph#: 1.3 10*3/uL (ref 0.9–3.3)
nRBC: 0 % (ref 0–0)

## 2016-02-24 LAB — COMPREHENSIVE METABOLIC PANEL
ALBUMIN: 2.6 g/dL — AB (ref 3.5–5.0)
ALK PHOS: 165 U/L — AB (ref 40–150)
ALT: 13 U/L (ref 0–55)
AST: 13 U/L (ref 5–34)
Anion Gap: 8 mEq/L (ref 3–11)
BUN: 18.1 mg/dL (ref 7.0–26.0)
CALCIUM: 9 mg/dL (ref 8.4–10.4)
CO2: 25 mEq/L (ref 22–29)
Chloride: 108 mEq/L (ref 98–109)
Creatinine: 1.4 mg/dL — ABNORMAL HIGH (ref 0.7–1.3)
EGFR: 63 mL/min/{1.73_m2} — AB (ref >90)
Glucose: 245 mg/dl — ABNORMAL HIGH (ref 70–140)
POTASSIUM: 4.6 meq/L (ref 3.5–5.1)
Sodium: 141 mEq/L (ref 136–145)
Total Bilirubin: 0.31 mg/dL (ref 0.20–1.20)
Total Protein: 6.1 g/dL — ABNORMAL LOW (ref 6.4–8.3)

## 2016-03-02 ENCOUNTER — Other Ambulatory Visit (HOSPITAL_BASED_OUTPATIENT_CLINIC_OR_DEPARTMENT_OTHER): Payer: Medicare Other

## 2016-03-02 DIAGNOSIS — C3432 Malignant neoplasm of lower lobe, left bronchus or lung: Secondary | ICD-10-CM

## 2016-03-02 DIAGNOSIS — C3492 Malignant neoplasm of unspecified part of left bronchus or lung: Secondary | ICD-10-CM

## 2016-03-02 LAB — COMPREHENSIVE METABOLIC PANEL
ALBUMIN: 2.4 g/dL — AB (ref 3.5–5.0)
ALK PHOS: 157 U/L — AB (ref 40–150)
ALT: 8 U/L (ref 0–55)
AST: 13 U/L (ref 5–34)
Anion Gap: 8 mEq/L (ref 3–11)
BUN: 17.3 mg/dL (ref 7.0–26.0)
CALCIUM: 9.2 mg/dL (ref 8.4–10.4)
CHLORIDE: 105 meq/L (ref 98–109)
CO2: 25 mEq/L (ref 22–29)
CREATININE: 1.8 mg/dL — AB (ref 0.7–1.3)
EGFR: 49 mL/min/{1.73_m2} — ABNORMAL LOW (ref >90)
GLUCOSE: 280 mg/dL — AB (ref 70–140)
Potassium: 4.9 mEq/L (ref 3.5–5.1)
Sodium: 137 mEq/L (ref 136–145)
Total Bilirubin: <0.22 mg/dL (ref 0.20–1.20)
Total Protein: 6.6 g/dL (ref 6.4–8.3)

## 2016-03-02 LAB — CBC WITH DIFFERENTIAL/PLATELET
BASO%: 0.4 % (ref 0.0–2.0)
Basophils Absolute: 0.1 10*3/uL (ref 0.0–0.1)
EOS%: 0.1 % (ref 0.0–7.0)
Eosinophils Absolute: 0 10*3/uL (ref 0.0–0.5)
HEMATOCRIT: 27.1 % — AB (ref 38.4–49.9)
HEMOGLOBIN: 8.6 g/dL — AB (ref 13.0–17.1)
LYMPH#: 1 10*3/uL (ref 0.9–3.3)
LYMPH%: 5.1 % — ABNORMAL LOW (ref 14.0–49.0)
MCH: 21.8 pg — ABNORMAL LOW (ref 27.2–33.4)
MCHC: 31.8 g/dL — ABNORMAL LOW (ref 32.0–36.0)
MCV: 68.7 fL — ABNORMAL LOW (ref 79.3–98.0)
MONO#: 1.3 10*3/uL — ABNORMAL HIGH (ref 0.1–0.9)
MONO%: 6.5 % (ref 0.0–14.0)
NEUT#: 17.3 10*3/uL — ABNORMAL HIGH (ref 1.5–6.5)
NEUT%: 87.9 % — ABNORMAL HIGH (ref 39.0–75.0)
Platelets: 294 10*3/uL (ref 140–400)
RBC: 3.95 10*6/uL — ABNORMAL LOW (ref 4.20–5.82)
RDW: 18.3 % — AB (ref 11.0–14.6)
WBC: 19.7 10*3/uL — AB (ref 4.0–10.3)

## 2016-03-03 ENCOUNTER — Ambulatory Visit (HOSPITAL_COMMUNITY)
Admission: RE | Admit: 2016-03-03 | Discharge: 2016-03-03 | Disposition: A | Discharge status: Home / Self Care | Payer: Medicare Other | Source: Ambulatory Visit | Attending: Internal Medicine | Admitting: Internal Medicine

## 2016-03-03 ENCOUNTER — Other Ambulatory Visit: Payer: Self-pay | Admitting: Internal Medicine

## 2016-03-03 DIAGNOSIS — C3492 Malignant neoplasm of unspecified part of left bronchus or lung: Secondary | ICD-10-CM

## 2016-03-03 DIAGNOSIS — J90 Pleural effusion, not elsewhere classified: Secondary | ICD-10-CM | POA: Diagnosis not present

## 2016-03-03 DIAGNOSIS — Z716 Tobacco abuse counseling: Secondary | ICD-10-CM

## 2016-03-03 DIAGNOSIS — Z85118 Personal history of other malignant neoplasm of bronchus and lung: Secondary | ICD-10-CM | POA: Insufficient documentation

## 2016-03-03 DIAGNOSIS — Z5111 Encounter for antineoplastic chemotherapy: Secondary | ICD-10-CM

## 2016-03-03 DIAGNOSIS — J9811 Atelectasis: Secondary | ICD-10-CM | POA: Insufficient documentation

## 2016-03-03 DIAGNOSIS — K769 Liver disease, unspecified: Secondary | ICD-10-CM | POA: Diagnosis not present

## 2016-03-03 DIAGNOSIS — I10 Essential (primary) hypertension: Secondary | ICD-10-CM

## 2016-03-03 DIAGNOSIS — I700 Atherosclerosis of aorta: Secondary | ICD-10-CM | POA: Diagnosis not present

## 2016-03-03 DIAGNOSIS — Z08 Encounter for follow-up examination after completed treatment for malignant neoplasm: Secondary | ICD-10-CM | POA: Insufficient documentation

## 2016-03-05 ENCOUNTER — Other Ambulatory Visit: Payer: Self-pay | Admitting: Internal Medicine

## 2016-03-05 DIAGNOSIS — R059 Cough, unspecified: Secondary | ICD-10-CM

## 2016-03-05 DIAGNOSIS — R05 Cough: Secondary | ICD-10-CM

## 2016-03-08 ENCOUNTER — Other Ambulatory Visit: Payer: Self-pay | Admitting: Medical Oncology

## 2016-03-08 DIAGNOSIS — C3492 Malignant neoplasm of unspecified part of left bronchus or lung: Secondary | ICD-10-CM

## 2016-03-09 ENCOUNTER — Encounter: Payer: Self-pay | Admitting: Internal Medicine

## 2016-03-09 ENCOUNTER — Ambulatory Visit (HOSPITAL_BASED_OUTPATIENT_CLINIC_OR_DEPARTMENT_OTHER): Payer: Medicare Other | Admitting: Internal Medicine

## 2016-03-09 ENCOUNTER — Ambulatory Visit: Payer: Medicare Other

## 2016-03-09 ENCOUNTER — Other Ambulatory Visit: Payer: Medicare Other

## 2016-03-09 ENCOUNTER — Telehealth: Payer: Self-pay | Admitting: *Deleted

## 2016-03-09 ENCOUNTER — Telehealth: Payer: Self-pay | Admitting: Internal Medicine

## 2016-03-09 VITALS — BP 151/87 | HR 107 | Temp 98.0°F | Resp 17 | Ht 71.0 in | Wt 175.1 lb

## 2016-03-09 DIAGNOSIS — D6481 Anemia due to antineoplastic chemotherapy: Secondary | ICD-10-CM | POA: Diagnosis not present

## 2016-03-09 DIAGNOSIS — C3492 Malignant neoplasm of unspecified part of left bronchus or lung: Secondary | ICD-10-CM

## 2016-03-09 DIAGNOSIS — N183 Chronic kidney disease, stage 3 (moderate): Secondary | ICD-10-CM | POA: Diagnosis not present

## 2016-03-09 DIAGNOSIS — E119 Type 2 diabetes mellitus without complications: Secondary | ICD-10-CM

## 2016-03-09 DIAGNOSIS — N182 Chronic kidney disease, stage 2 (mild): Secondary | ICD-10-CM

## 2016-03-09 DIAGNOSIS — C3432 Malignant neoplasm of lower lobe, left bronchus or lung: Secondary | ICD-10-CM

## 2016-03-09 DIAGNOSIS — L089 Local infection of the skin and subcutaneous tissue, unspecified: Secondary | ICD-10-CM

## 2016-03-09 DIAGNOSIS — I10 Essential (primary) hypertension: Secondary | ICD-10-CM | POA: Diagnosis not present

## 2016-03-09 DIAGNOSIS — Z7189 Other specified counseling: Secondary | ICD-10-CM | POA: Insufficient documentation

## 2016-03-09 DIAGNOSIS — Z5111 Encounter for antineoplastic chemotherapy: Secondary | ICD-10-CM

## 2016-03-09 HISTORY — DX: Other specified counseling: Z71.89

## 2016-03-09 LAB — CBC WITH DIFFERENTIAL/PLATELET
BASO%: 0 % (ref 0.0–2.0)
BASOS ABS: 0 10*3/uL (ref 0.0–0.1)
EOS ABS: 0 10*3/uL (ref 0.0–0.5)
EOS%: 0 % (ref 0.0–7.0)
HEMATOCRIT: 27.4 % — AB (ref 38.4–49.9)
HGB: 8.7 g/dL — ABNORMAL LOW (ref 13.0–17.1)
LYMPH%: 3.1 % — AB (ref 14.0–49.0)
MCH: 21.6 pg — AB (ref 27.2–33.4)
MCHC: 31.8 g/dL — AB (ref 32.0–36.0)
MCV: 68 fL — AB (ref 79.3–98.0)
MONO#: 0.7 10*3/uL (ref 0.1–0.9)
MONO%: 2.8 % (ref 0.0–14.0)
NEUT#: 23.8 10*3/uL — ABNORMAL HIGH (ref 1.5–6.5)
NEUT%: 94.1 % — AB (ref 39.0–75.0)
PLATELETS: 416 10*3/uL — AB (ref 140–400)
RBC: 4.03 10*6/uL — ABNORMAL LOW (ref 4.20–5.82)
RDW: 20.2 % — ABNORMAL HIGH (ref 11.0–14.6)
WBC: 25.3 10*3/uL — ABNORMAL HIGH (ref 4.0–10.3)
lymph#: 0.8 10*3/uL — ABNORMAL LOW (ref 0.9–3.3)
nRBC: 0 % (ref 0–0)

## 2016-03-09 LAB — COMPREHENSIVE METABOLIC PANEL
ALT: 13 U/L (ref 0–55)
ANION GAP: 10 meq/L (ref 3–11)
AST: 14 U/L (ref 5–34)
Albumin: 2.6 g/dL — ABNORMAL LOW (ref 3.5–5.0)
Alkaline Phosphatase: 126 U/L (ref 40–150)
BUN: 37.4 mg/dL — ABNORMAL HIGH (ref 7.0–26.0)
CALCIUM: 9.2 mg/dL (ref 8.4–10.4)
CHLORIDE: 105 meq/L (ref 98–109)
CO2: 21 mEq/L — ABNORMAL LOW (ref 22–29)
CREATININE: 1.9 mg/dL — AB (ref 0.7–1.3)
EGFR: 44 mL/min/{1.73_m2} — AB (ref >90)
Glucose: 369 mg/dl — ABNORMAL HIGH (ref 70–140)
Potassium: 5.8 mEq/L — ABNORMAL HIGH (ref 3.5–5.1)
Sodium: 135 mEq/L — ABNORMAL LOW (ref 136–145)
Total Bilirubin: <0.22 mg/dL (ref 0.20–1.20)
Total Protein: 6.8 g/dL (ref 6.4–8.3)

## 2016-03-09 MED ORDER — SODIUM POLYSTYRENE SULFONATE PO POWD: Freq: Once | ORAL | 0 refills | Status: AC

## 2016-03-09 NOTE — Telephone Encounter (Signed)
Per 1/23 LOS and staff message I have scheduled appts and notified the scheduler

## 2016-03-09 NOTE — Progress Notes (Signed)
START ON PATHWAY REGIMEN - Non-Small Cell Lung  Other Clinical Trial: [place]  Patient Characteristics: Stage IV Metastatic, Squamous, PS = 0, 1, Fourth Line and Beyond AJCC T Category: T2a Current Disease Status: Distant Metastases AJCC N Category: N3 AJCC M Category: M1c AJCC 8 Stage Grouping: IVB Histology: Squamous Cell Line of therapy: Fourth Line and Beyond PD-L1 Expression Status: Quantity Not Sufficient Performance Status: PS = 0, 1 Would you be surprised if this patient died  in the next year? I would NOT be surprised if this patient died in the next year  Intent of Therapy: Non-Curative / Palliative Intent, Discussed with Patient

## 2016-03-09 NOTE — Progress Notes (Signed)
[place] [phone]   [phone]  OFFICE PROGRESS NOTE  [name], MD [place] [address]  DIAGNOSIS: Stage IV (T2a, N3, M1b) non-small cell lung cancer, squamous cell carcinoma presented with left lower lobe lung mass in April 2016.  PRIOR THERAPY: 1) Systemic chemotherapy with carboplatin for AUC of 5 given on day 1 and gemcitabine 1000 MG/M2 on days 1 and 8 every 3 weeks. Status post 6 cycles with partial response. 2) Nivolumab 240 MG IV every 2 weeks status post 4 cycles, last dose was given 03/18/2015 discontinued today secondary to disease progression. 3) systemic chemotherapy with docetaxel 75 MG/M2 and Cyramza 10 MG/KG every 3 weeks with Neulasta support status post 14 cycles. First dose was given 04/08/2015. Last dose was given 02/17/2016 discontinued secondary to disease progression.  CURRENT THERAPY: Navelbine 25 MG/M2 every 2 weeks, first dose 03/16/2016.  INTERVAL HISTORY: Keith Holloway 56 y.o. male returns to the clinic today for follow-up visit accompanied by his wife. The patient continues to complain of increasing fatigue and weakness as well as shortness of breath at baseline and increased with exertion. He also has cough. He denied having any fever or chills. He denied having any hemoptysis. He has some choking sensation with swallowing. He lost around 6 pounds since his last visit. His blood pressure is still uncontrolled. He denied having any nausea, vomiting, diarrhea or constipation. He tolerated his previous chemotherapy with docetaxel and Cyramza of fairly well. He continues to have several areas of his skin infection and abscess formation and he is followed by Dr. Leighton Ruff. He had repeat CT scan of the chest, abdomen and pelvis performed recently and he is here for evaluation and discussion of his scan results.  MEDICAL HISTORY: Past Medical History:  Diagnosis Date  . Acute hyperglycemia  11/26/2015  . Alcohol abuse   . Arthritis   . Borderline hypertension   . CHF (congestive heart failure) ([place])   . CKD (chronic kidney disease), stage III   . Cocaine abuse    last [employer] post[employer] for cocaine 09-30-2015  . Depression   . Dyspnea on effort   . Elevated blood sugar 11/26/2015  . History of acute renal failure    09-30-2015  . History of hyperkalemia    admission 09-30-2015 potassiom 8.0  with medications currently 10-14-2015 down to 4.7 normal  . History of traumatic head injury    1987  w/ LOC   --- no residual  . Hyperglycemia 02/17/2016  . Hyperlipidemia   . Hypertension 12/16/2015  . 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 Memory disorder 08/22/2013   alcohol abuse  . Migraine   . Nocturia   . Non-small cell carcinoma of lung, stage 4 [facility]) oncologist-  dr Julien Nordmann   w/ METs to liver dx 04/ 2016--  Stage IV (T2a, N3, M1b), squamous cell carcinoma of left lower lobe---  systemic chemotherapy every 3 weeks and currently due to evidence for disease progression has started immunotherapy  . Pilonidal abscess    recurrent  . Pleural effusion, bilateral    per ct 10-09-2015  new small right effusion and left small to moderate effusion , increased  . Polyneuropathy in diabetes(357.2)   . Port-a-cath in place   . Pulmonary nodules/lesions, multiple    pt has stage 4 lung cancer  . Recurrent left pleural effusion 01/06/2016  . Tobacco abuse counseling  10/09/2014  . Type 2 diabetes mellitus with insulin therapy ([place])   . Wears dentures    upper    ALLERGIES:  has No Known Allergies.  MEDICATIONS:  Current Outpatient Prescriptions  Medication Sig Dispense Refill  . acetaminophen (TYLENOL) 325 MG tablet Take 2 tablets (650 mg total) by mouth every 6 (six) hours as needed for mild pain (or Fever >/= 101). 30 tablet 0  . amLODipine (NORVASC) 10 MG tablet Take 1 tablet (10 mg total) by mouth daily. 30 tablet 1  . aspirin EC  81 MG tablet Take 1 tablet by mouth every morning.     . benzonatate (TESSALON) 100 MG capsule TAKE ONE CAPSULE BY MOUTH 3 TIMES A DAY AS NEEDED FOR COUGH 20 capsule 0  . Cholecalciferol (VITAMIN D-3) 1000 units CAPS Take 1,000 Units by mouth daily.    Marland Kitchen dexamethasone (DECADRON) 4 MG tablet 2 TABLETS BY MOUTH TWICE A DAY THE DAY BEFORE, DAY OF AND DAY AFTER THE CHEMOTHERAPY EVERY 3 WEEKS 40 tablet 1  . docusate sodium (COLACE) 100 MG capsule Take 1 capsule (100 mg total) by mouth 2 (two) times daily. 40 capsule 0  . Ferrous Sulfate (IRON) 325 (65 Fe) MG TABS Take 1 tablet by mouth 2 (two) times daily.     Marland Kitchen HYDROcodone-acetaminophen (NORCO/VICODIN) 5-325 MG tablet Take 2 tablets by mouth every 6 (six) hours as needed. 30 tablet 0  . ibuprofen (ADVIL,MOTRIN) 200 MG tablet Take 400 mg by mouth every 6 (six) hours as needed for moderate pain.    Marland Kitchen insulin NPH-regular Human (NOVOLIN 70/30) (70-30) 100 UNIT/ML injection Inject 35 Units into the skin 2 (two) times daily with a meal. (Patient taking differently: Inject 20 Units into the skin 2 (two) times daily with a meal. ONLY IF CBG IS OVER 200.) 10 mL 11  . lidocaine-prilocaine (EMLA) cream APPLY 1 APPLICATION TOPICALLY AS NEEDED. 30 g 0  . ondansetron (ZOFRAN) 8 MG tablet TAKE 1 TABLET EVERY 8 HOURS AS NEEDED FOR NAUSEA AND VOMITING 20 tablet 1  . prochlorperazine (COMPAZINE) 10 MG tablet TAKE 1 TABLET (10 MG TOTAL) BY MOUTH EVERY 6 (SIX) HOURS AS NEEDED FOR NAUSEA OR VOMITING. 30 tablet 0  . simvastatin (ZOCOR) 40 MG tablet Take 1 tablet (40 mg total) by mouth daily. (Patient taking differently: Take 40 mg by mouth every morning. ) 30 tablet 3  . temazepam (RESTORIL) 30 MG capsule TAKE ONE CAPSULE AT BEDTIME 30 capsule 0  . vitamin C (ASCORBIC ACID) 500 MG tablet Take 500 mg by mouth 2 (two) times daily.      No current facility-administered medications for this visit.     SURGICAL HISTORY:  Past Surgical History:  Procedure Laterality Date  .  CATARACT EXTRACTION, BILATERAL  08/2012  . PILONIDAL CYST EXCISION N/A 10/17/2015   Procedure: EXCISION PILONIDAL DISEASE;  Surgeon: Leighton Ruff, MD;  Location: [facility];  Service: General;  Laterality: N/A;  . PORTACATH PLACEMENT  01/30/2015  . RETINAL DETACHMENT SURGERY Bilateral 04/2012    REVIEW OF SYSTEMS:  Constitutional: positive for anorexia, fatigue and weight loss Eyes: negative Ears, nose, mouth, throat, and face: negative Respiratory: positive for cough, dyspnea on exertion and wheezing Cardiovascular: negative Gastrointestinal: positive for dysphagia Genitourinary:negative Integument/breast: negative Hematologic/lymphatic: negative Musculoskeletal:positive for muscle weakness Neurological: negative Behavioral/Psych: negative Endocrine: negative Allergic/Immunologic: negative   PHYSICAL EXAMINATION: General appearance: alert, cooperative, fatigued and no distress Head: Normocephalic, without obvious abnormality, atraumatic Neck: no adenopathy, no JVD, supple, symmetrical,  trachea midline and thyroid not enlarged, symmetric, no tenderness/mass/nodules Lymph nodes: Cervical, supraclavicular, and axillary nodes normal. Resp: diminished breath sounds LLL and dullness to percussion LLL Back: symmetric, no curvature. ROM normal. No CVA tenderness. Cardio: regular rate and rhythm, S1, S2 normal, no murmur, click, rub or gallop GI: soft, non-tender; bowel sounds normal; no masses,  no organomegaly Extremities: extremities normal, atraumatic, no cyanosis or edema Neurologic: Alert and oriented X 3, normal strength and tone. Normal symmetric reflexes. Normal coordination and gait  ECOG PERFORMANCE STATUS: 1 - Symptomatic but completely ambulatory  Blood pressure (!) 151/87, pulse (!) 107, temperature 98 F (36.7 C), temperature source Oral, resp. rate 17, height '5\' 11"'$  (1.803 m), weight 175 lb 1.6 oz (79.4 kg), SpO2 94 %.  LABORATORY DATA: Lab Results    Component Value Date   WBC 19.7 (H) 03/02/2016   HGB 8.6 (L) 03/02/2016   HCT 27.1 (L) 03/02/2016   MCV 68.7 (L) 03/02/2016   PLT 294 03/02/2016      Chemistry      Component Value Date/Time   NA 137 03/02/2016 1024   K 4.9 03/02/2016 1024   CL 111 11/19/2015 0448   CO2 25 03/02/2016 1024   BUN 17.3 03/02/2016 1024   CREATININE 1.8 (H) 03/02/2016 1024   GLU 396 (H) 10/28/2015 1249      Component Value Date/Time   CALCIUM 9.2 03/02/2016 1024   ALKPHOS 157 (H) 03/02/2016 1024   AST 13 03/02/2016 1024   ALT 8 03/02/2016 1024   BILITOT <0.22 03/02/2016 1024       RADIOGRAPHIC STUDIES: Ct Abdomen Pelvis Wo Contrast  Result Date: 03/03/2016 CLINICAL DATA:  56 year old male with history of lung cancer diagnosed in 2016 undergoing ongoing chemotherapy. Chronic cough and shortness of breath for 1 month. Restaging examination. EXAM: CT CHEST, ABDOMEN AND PELVIS WITHOUT CONTRAST TECHNIQUE: Multidetector CT imaging of the chest, abdomen and pelvis was performed following the standard protocol without IV contrast. COMPARISON:  CT the chest, abdomen and pelvis 01/01/2016. FINDINGS: CT CHEST FINDINGS Cardiovascular: Heart size is normal. Small amount of pericardial fluid and/or thickening, similar to the prior study, unlikely to be of hemodynamic significance. No associated pericardial calcification. No atherosclerotic calcifications identified in the thoracic aorta or the coronary arteries. Right internal jugular single-lumen porta cath with tip terminating immediately right atrium. Mediastinum/Nodes: Bulky nodal mass in the anterior mediastinum poorly evaluated on today's noncontrast CT examination, but appearing larger than the prior study, estimated to measure approximately 4.6 x 7.7 cm, significantly increased compared to the prior examination (previously only 3.9 x 6.6 cm). Subcarinal lymph nodes also appear increased in size measuring 11 mm in short axis. Esophagus is unremarkable in  appearance. No axillary lymphadenopathy. Lungs/Pleura: Large left pleural effusion appears similar to the prior study and is associated with chronic areas of atelectasis in the left lung, including areas around the previously noted large left lower lobe mass which appears chronically collapsed. Pleural nodularity is noted, with the largest pleural-based nodule measuring 11 x 21 mm (image 33 of series 2), larger and more well-defined than on the prior examination. Thickening of the peribronchovascular interstitium throughout the left lower lobe is again noted, potentially indicative of residual disease. Several areas of pleural-based nodularity are again noted in the left lung, particularly near the apex of the left upper lobe, with the largest of these nodules measures 18 x 17 mm (image 19 of series 4), increased compared to the prior examination, concerning for areas of potential  lymphangitic spread of disease. Throughout the aerated portions of the lungs there are increasing areas of ground-glass attenuation, septal thickening and thickening of the peribronchovascular interstitium which are significantly increased compared to the prior examination. A few scattered tiny 2-3 mm pulmonary nodules are noted throughout the right lung, similar to the prior study, nonspecific, favored to represent areas of mucoid impaction. No other larger more suspicious appearing pulmonary nodules are confidently identified. Musculoskeletal: Area of sclerosis in the sternum, similar to prior studies, favored to represent postradiation changes. No other aggressive appearing lytic or blastic lesions are noted in the visualized portions of the skeleton. CT ABDOMEN PELVIS FINDINGS Hepatobiliary: Several ill-defined slightly low-attenuation lesions are noted in the liver, generally increased in size compared to the prior examination, concerning for metastatic disease, with the largest lesion measuring 2.9 x 2.3 cm between segments 2 and 3  (previously only 1.9 cm) on image 54 of series 2. Unenhanced appearance of the gallbladder is unremarkable. Pancreas: No definite pancreatic mass or peripancreatic inflammatory changes are noted on today's noncontrast CT examination. Spleen: Unremarkable. Adrenals/Urinary Tract: Normal appearance of the kidneys and bilateral adrenal glands. No hydroureteronephrosis. Urinary bladder is unremarkable in appearance. Stomach/Bowel: Unenhanced appearance of the stomach is unremarkable. No pathologic dilatation of small bowel or colon. The appendix is not confidently identified and may be surgically absent. Regardless, there are no inflammatory changes noted adjacent to the cecum to suggest the presence of an acute appendicitis at this time. Vascular/Lymphatic: Aortic atherosclerosis, without definite aneurysm in the abdominal or pelvic vasculature. No lymphadenopathy noted in the abdomen or pelvis. Reproductive: Prostate gland and seminal vesicles are unremarkable in appearance. Other: No significant volume of ascites.  No pneumoperitoneum. Musculoskeletal: There are no aggressive appearing lytic or blastic lesions noted in the visualized portions of the skeleton. IMPRESSION: 1. Today's study demonstrates progression of disease with interval enlargement of the previously noted large anterior mediastinal nodal mass, slight progression of what appears to be lymphangitic disease in the superior aspect of the left upper lobe, stable presumably malignant pleural effusion but with increasing pleural nodularity and enlarging liver lesions which likely represent metastatic lesions. 2. Interval development of ground-glass attenuation and septal thickening throughout the lungs bilaterally. This is nonspecific, but is concerning for potential drug reaction, developing interstitial lung disease, or noncardiogenic edema. 3. Additional incidental findings, as above. Electronically Signed   By: Vinnie Langton M.D.   On: 03/03/2016  09:53   Ct Chest Wo Contrast  Result Date: 03/03/2016 CLINICAL DATA:  56 year old male with history of lung cancer diagnosed in 2016 undergoing ongoing chemotherapy. Chronic cough and shortness of breath for 1 month. Restaging examination. EXAM: CT CHEST, ABDOMEN AND PELVIS WITHOUT CONTRAST TECHNIQUE: Multidetector CT imaging of the chest, abdomen and pelvis was performed following the standard protocol without IV contrast. COMPARISON:  CT the chest, abdomen and pelvis 01/01/2016. FINDINGS: CT CHEST FINDINGS Cardiovascular: Heart size is normal. Small amount of pericardial fluid and/or thickening, similar to the prior study, unlikely to be of hemodynamic significance. No associated pericardial calcification. No atherosclerotic calcifications identified in the thoracic aorta or the coronary arteries. Right internal jugular single-lumen porta cath with tip terminating immediately right atrium. Mediastinum/Nodes: Bulky nodal mass in the anterior mediastinum poorly evaluated on today's noncontrast CT examination, but appearing larger than the prior study, estimated to measure approximately 4.6 x 7.7 cm, significantly increased compared to the prior examination (previously only 3.9 x 6.6 cm). Subcarinal lymph nodes also appear increased in size measuring 11 mm in  short axis. Esophagus is unremarkable in appearance. No axillary lymphadenopathy. Lungs/Pleura: Large left pleural effusion appears similar to the prior study and is associated with chronic areas of atelectasis in the left lung, including areas around the previously noted large left lower lobe mass which appears chronically collapsed. Pleural nodularity is noted, with the largest pleural-based nodule measuring 11 x 21 mm (image 33 of series 2), larger and more well-defined than on the prior examination. Thickening of the peribronchovascular interstitium throughout the left lower lobe is again noted, potentially indicative of residual disease. Several areas  of pleural-based nodularity are again noted in the left lung, particularly near the apex of the left upper lobe, with the largest of these nodules measures 18 x 17 mm (image 19 of series 4), increased compared to the prior examination, concerning for areas of potential lymphangitic spread of disease. Throughout the aerated portions of the lungs there are increasing areas of ground-glass attenuation, septal thickening and thickening of the peribronchovascular interstitium which are significantly increased compared to the prior examination. A few scattered tiny 2-3 mm pulmonary nodules are noted throughout the right lung, similar to the prior study, nonspecific, favored to represent areas of mucoid impaction. No other larger more suspicious appearing pulmonary nodules are confidently identified. Musculoskeletal: Area of sclerosis in the sternum, similar to prior studies, favored to represent postradiation changes. No other aggressive appearing lytic or blastic lesions are noted in the visualized portions of the skeleton. CT ABDOMEN PELVIS FINDINGS Hepatobiliary: Several ill-defined slightly low-attenuation lesions are noted in the liver, generally increased in size compared to the prior examination, concerning for metastatic disease, with the largest lesion measuring 2.9 x 2.3 cm between segments 2 and 3 (previously only 1.9 cm) on image 54 of series 2. Unenhanced appearance of the gallbladder is unremarkable. Pancreas: No definite pancreatic mass or peripancreatic inflammatory changes are noted on today's noncontrast CT examination. Spleen: Unremarkable. Adrenals/Urinary Tract: Normal appearance of the kidneys and bilateral adrenal glands. No hydroureteronephrosis. Urinary bladder is unremarkable in appearance. Stomach/Bowel: Unenhanced appearance of the stomach is unremarkable. No pathologic dilatation of small bowel or colon. The appendix is not confidently identified and may be surgically absent. Regardless, there  are no inflammatory changes noted adjacent to the cecum to suggest the presence of an acute appendicitis at this time. Vascular/Lymphatic: Aortic atherosclerosis, without definite aneurysm in the abdominal or pelvic vasculature. No lymphadenopathy noted in the abdomen or pelvis. Reproductive: Prostate gland and seminal vesicles are unremarkable in appearance. Other: No significant volume of ascites.  No pneumoperitoneum. Musculoskeletal: There are no aggressive appearing lytic or blastic lesions noted in the visualized portions of the skeleton. IMPRESSION: 1. Today's study demonstrates progression of disease with interval enlargement of the previously noted large anterior mediastinal nodal mass, slight progression of what appears to be lymphangitic disease in the superior aspect of the left upper lobe, stable presumably malignant pleural effusion but with increasing pleural nodularity and enlarging liver lesions which likely represent metastatic lesions. 2. Interval development of ground-glass attenuation and septal thickening throughout the lungs bilaterally. This is nonspecific, but is concerning for potential drug reaction, developing interstitial lung disease, or noncardiogenic edema. 3. Additional incidental findings, as above. Electronically Signed   By: Vinnie Langton M.D.   On: 03/03/2016 09:53    ASSESSMENT AND PLAN: This is a very pleasant 56 years old African-American male with metastatic non-small cell lung cancer, squamous cell carcinoma diagnosed initially in February 2016 status post several chemotherapy regimens including carboplatin and gemcitabine followed by  immunotherapy with Navelbine and most recently treatment with docetaxel and Cyramza for 14 cycles. The patient is more symptomatic these days. He had repeat CT scan of the chest, abdomen and pelvis performed recently. I personally and independently reviewed the scan images and discuss the results with the patient and his  wife. Unfortunately his recent CT scan of the chest, abdomen and pelvis showed evidence for disease progression with enlargement of the anterior mediastinal lung mass as well as questionable lymphangitic spread and increase and the liver lesion and pleural nodularity. I discussed the scan results with the patient and his wife. I also discussed with him his treatment options and the goals of care. I strongly recommended for the patient to consider palliative care and hospice at this point but the patient would like to continue with other treatment options. I gave him the option of treatment with single agent Navelbine 25 MG/M2 every 2 weeks. I discussed with the patient adverse effect of this treatment including but not limited to alopecia, myelosuppression, nausea and vomiting, peripheral neuropathy, liver or renal dysfunction. He is expected to start the first cycle of this treatment next week. She'll come back for follow-up visit in 3 weeks for evaluation before starting cycle #2. For hypertension, the patient will continue on his current blood pressure medication with Norvasc. For the chemotherapy-induced anemia we will continue to monitor closely and the patient will continue his treatment with the oral ferrous sulfate. For the cough, I would give the patient refill of Tessalon. For the history of diabetes mellitus he will continue with his current antidiabetic medication as prescribed by his primary care physician.  The patient voices understanding of current disease status and treatment options and is in agreement with the current care plan.  All questions were answered. The patient knows to call the clinic with any problems, questions or concerns. We can certainly see the patient much sooner if necessary.  Disclaimer: This note was dictated with voice recognition software. Similar sounding words can inadvertently be transcribed and may not be corrected upon review.

## 2016-03-09 NOTE — Telephone Encounter (Signed)
rx sent to pt pharmacy. Notified pt's wife to pick up. Wife requested CT scan results be mailed to her as she forgot to ask for a copy while at MD visit today.

## 2016-03-09 NOTE — Telephone Encounter (Signed)
-----   Message from Curt Bears, MD sent at 03/09/2016  1:25 PM EST ----- Kayexalate 15 g (ml) po X 1 ----- Message ----- From: Interface, Lab In Three Zero One Sent: 03/09/2016  10:56 AM To: Curt Bears, MD

## 2016-03-09 NOTE — Telephone Encounter (Signed)
Appointments scheduled per 1/23 LOS. Patient given AVS report and calendars with future scheduled appointments. °

## 2016-03-11 ENCOUNTER — Ambulatory Visit: Payer: Medicare Other

## 2016-03-16 ENCOUNTER — Ambulatory Visit (HOSPITAL_BASED_OUTPATIENT_CLINIC_OR_DEPARTMENT_OTHER): Payer: Medicare Other

## 2016-03-16 ENCOUNTER — Encounter: Payer: Self-pay | Admitting: Internal Medicine

## 2016-03-16 ENCOUNTER — Other Ambulatory Visit: Payer: Medicare Other

## 2016-03-16 ENCOUNTER — Other Ambulatory Visit (HOSPITAL_BASED_OUTPATIENT_CLINIC_OR_DEPARTMENT_OTHER): Payer: Medicare Other

## 2016-03-16 ENCOUNTER — Ambulatory Visit (HOSPITAL_BASED_OUTPATIENT_CLINIC_OR_DEPARTMENT_OTHER): Payer: Medicare Other | Admitting: Internal Medicine

## 2016-03-16 VITALS — HR 118

## 2016-03-16 VITALS — BP 135/89 | HR 128 | Temp 99.3°F | Resp 18 | Wt 172.0 lb

## 2016-03-16 DIAGNOSIS — F5102 Adjustment insomnia: Secondary | ICD-10-CM

## 2016-03-16 DIAGNOSIS — Z5111 Encounter for antineoplastic chemotherapy: Secondary | ICD-10-CM | POA: Diagnosis present

## 2016-03-16 DIAGNOSIS — E860 Dehydration: Secondary | ICD-10-CM | POA: Diagnosis not present

## 2016-03-16 DIAGNOSIS — C3492 Malignant neoplasm of unspecified part of left bronchus or lung: Secondary | ICD-10-CM

## 2016-03-16 DIAGNOSIS — C3432 Malignant neoplasm of lower lobe, left bronchus or lung: Secondary | ICD-10-CM | POA: Diagnosis present

## 2016-03-16 DIAGNOSIS — Z7189 Other specified counseling: Secondary | ICD-10-CM

## 2016-03-16 DIAGNOSIS — N182 Chronic kidney disease, stage 2 (mild): Secondary | ICD-10-CM

## 2016-03-16 LAB — CBC WITH DIFFERENTIAL/PLATELET
BASO%: 1.7 % (ref 0.0–2.0)
BASOS ABS: 0.2 10*3/uL — AB (ref 0.0–0.1)
EOS%: 2.1 % (ref 0.0–7.0)
Eosinophils Absolute: 0.2 10*3/uL (ref 0.0–0.5)
HEMATOCRIT: 30.3 % — AB (ref 38.4–49.9)
HEMOGLOBIN: 9.6 g/dL — AB (ref 13.0–17.1)
LYMPH#: 1.1 10*3/uL (ref 0.9–3.3)
LYMPH%: 9.4 % — ABNORMAL LOW (ref 14.0–49.0)
MCH: 21.8 pg — AB (ref 27.2–33.4)
MCHC: 31.6 g/dL — ABNORMAL LOW (ref 32.0–36.0)
MCV: 69.1 fL — ABNORMAL LOW (ref 79.3–98.0)
MONO#: 1.4 10*3/uL — AB (ref 0.1–0.9)
MONO%: 12 % (ref 0.0–14.0)
NEUT#: 8.6 10*3/uL — ABNORMAL HIGH (ref 1.5–6.5)
NEUT%: 74.8 % (ref 39.0–75.0)
PLATELETS: 394 10*3/uL (ref 140–400)
RBC: 4.38 10*6/uL (ref 4.20–5.82)
RDW: 19.8 % — AB (ref 11.0–14.6)
WBC: 11.5 10*3/uL — ABNORMAL HIGH (ref 4.0–10.3)

## 2016-03-16 LAB — COMPREHENSIVE METABOLIC PANEL
ALBUMIN: 2.8 g/dL — AB (ref 3.5–5.0)
ALK PHOS: 106 U/L (ref 40–150)
ALT: 24 U/L (ref 0–55)
ANION GAP: 10 meq/L (ref 3–11)
AST: 18 U/L (ref 5–34)
BUN: 25.3 mg/dL (ref 7.0–26.0)
CALCIUM: 9.8 mg/dL (ref 8.4–10.4)
CO2: 23 mEq/L (ref 22–29)
CREATININE: 1.3 mg/dL (ref 0.7–1.3)
Chloride: 110 mEq/L — ABNORMAL HIGH (ref 98–109)
EGFR: 71 mL/min/{1.73_m2} — ABNORMAL LOW (ref >90)
Glucose: 136 mg/dl (ref 70–140)
POTASSIUM: 4.9 meq/L (ref 3.5–5.1)
Sodium: 143 mEq/L (ref 136–145)
Total Bilirubin: <0.22 mg/dL (ref 0.20–1.20)
Total Protein: 7 g/dL (ref 6.4–8.3)

## 2016-03-16 MED ORDER — PROCHLORPERAZINE MALEATE 10 MG PO TABS
ORAL_TABLET | ORAL | Status: AC
  Filled 2016-03-16: qty 1

## 2016-03-16 MED ORDER — SODIUM CHLORIDE 0.9 % IV SOLN
Freq: Once | INTRAVENOUS | Status: AC
  Administered 2016-03-16: 09:00:00 via INTRAVENOUS

## 2016-03-16 MED ORDER — VINORELBINE TARTRATE CHEMO INJECTION 50 MG/5ML
25.0000 mg/m2 | Freq: Once | INTRAVENOUS | Status: AC
  Administered 2016-03-16: 50 mg via INTRAVENOUS
  Filled 2016-03-16: qty 5

## 2016-03-16 MED ORDER — SODIUM CHLORIDE 0.9% FLUSH
10.0000 mL | INTRAVENOUS | Status: DC | PRN
  Administered 2016-03-16 (×2): 10 mL
  Filled 2016-03-16: qty 10

## 2016-03-16 MED ORDER — HEPARIN SOD (PORK) LOCK FLUSH 100 UNIT/ML IV SOLN
500.0000 [IU] | Freq: Once | INTRAVENOUS | Status: AC | PRN
  Administered 2016-03-16: 500 [IU]
  Filled 2016-03-16: qty 5

## 2016-03-16 MED ORDER — OXYCODONE-ACETAMINOPHEN 5-325 MG PO TABS: 1.0000 | ORAL_TABLET | ORAL | 0 refills | Status: DC | PRN

## 2016-03-16 MED ORDER — SODIUM CHLORIDE 0.9 % IV SOLN
Freq: Once | INTRAVENOUS | Status: AC
  Administered 2016-03-16: 11:00:00 via INTRAVENOUS

## 2016-03-16 MED ORDER — PROCHLORPERAZINE MALEATE 10 MG PO TABS
10.0000 mg | ORAL_TABLET | Freq: Once | ORAL | Status: AC
  Administered 2016-03-16: 10 mg via ORAL

## 2016-03-16 NOTE — Progress Notes (Signed)
[place] [phone]   [phone]  OFFICE PROGRESS NOTE  [name], MD [place] [address]  DIAGNOSIS: Stage IV (T2a, N3, M1b) non-small cell lung cancer, squamous cell carcinoma presented with left lower lobe lung mass in April 2016.  PRIOR THERAPY: 1) Systemic chemotherapy with carboplatin for AUC of 5 given on day 1 and gemcitabine 1000 MG/M2 on days 1 and 8 every Keith weeks. Status post 6 cycles with partial response. 2) Nivolumab 240 MG IV every 2 weeks status post 4 cycles, last dose was given 03/18/2015 discontinued today secondary to disease progression. Keith) systemic chemotherapy with docetaxel 75 MG/M2 and Cyramza 10 MG/KG every Keith weeks with Neulasta support status post 14 cycles. First dose was given 04/08/2015. Last dose was given 02/17/2016 discontinued secondary to disease progression.  CURRENT THERAPY: Navelbine 25 MG/M2 every 2 weeks, first dose 03/16/2016.  INTERVAL HISTORY: Keith Holloway 56 y.o. Holloway came to the clinic today for follow-up visit accompanied by his wife. The patient complains of pain in the chest more on the left side than the right side. He is also tachycardic today. He has shortness of breath with exertion and dry cough. He denied having any hemoptysis. He lost Keith pounds since his last visit. He denied having any fever or chills. He has no nausea, vomiting, diarrhea or constipation. He is requesting pain medication and refill Restoril for insomnia. He is here today for evaluation before starting the first dose of his chemotherapy with Navelbine.  MEDICAL HISTORY: Past Medical History:  Diagnosis Date  . Acute hyperglycemia 11/26/2015  . Alcohol abuse   . Arthritis   . Borderline hypertension   . CHF (congestive heart failure) ([place])   . CKD (chronic kidney disease), stage III   . Cocaine abuse    last [employer] post[employer] for cocaine 09-30-2015  . Depression   . Dyspnea on effort   . Elevated  blood sugar 11/26/2015  . Goals of care, counseling/discussion 03/09/2016  . History of acute renal failure    09-30-2015  . History of hyperkalemia    admission 09-30-2015 potassiom 8.0  with medications currently 10-14-2015 down to 4.7 normal  . History of traumatic head injury    1987  w/ LOC   --- no residual  . Hyperglycemia 02/17/2016  . Hyperlipidemia   . Hypertension 12/16/2015  . 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 Memory disorder 08/22/2013   alcohol abuse  . Migraine   . Nocturia   . Non-small cell carcinoma of lung, stage 4 [facility]) oncologist-  dr Julien Nordmann   w/ METs to liver dx 04/ 2016--  Stage IV (T2a, N3, M1b), squamous cell carcinoma of left lower lobe---  systemic chemotherapy every Keith weeks and currently due to evidence for disease progression has started immunotherapy  . Pilonidal abscess    recurrent  . Pleural effusion, bilateral    per ct 10-09-2015  new small right effusion and left small to moderate effusion , increased  . Polyneuropathy in diabetes(357.2)   . Port-a-cath in place   . Pulmonary nodules/lesions, multiple    pt has stage 4 lung cancer  . Recurrent left pleural effusion 01/06/2016  . Tobacco abuse counseling 10/09/2014  . Type 2 diabetes mellitus with insulin therapy ([place])   . Wears dentures    upper    ALLERGIES:  has No Known Allergies.  MEDICATIONS:  Current Outpatient  Prescriptions  Medication Sig Dispense Refill  . acetaminophen (TYLENOL) 325 MG tablet Take 2 tablets (650 mg total) by mouth every 6 (six) hours as needed for mild pain (or Fever >/= 101). 30 tablet 0  . amLODipine (NORVASC) 10 MG tablet Take 1 tablet (10 mg total) by mouth daily. 30 tablet 1  . aspirin EC 81 MG tablet Take 1 tablet by mouth every morning.     . benzonatate (TESSALON) 100 MG capsule TAKE ONE CAPSULE BY MOUTH Keith TIMES A DAY AS NEEDED FOR COUGH 20 capsule 0  . Cholecalciferol (VITAMIN D-Keith) 1000 units CAPS Take  1,000 Units by mouth daily.    Marland Kitchen dexamethasone (DECADRON) 4 MG tablet 2 TABLETS BY MOUTH TWICE A DAY THE DAY BEFORE, DAY OF AND DAY AFTER THE CHEMOTHERAPY EVERY Keith WEEKS 40 tablet 1  . docusate sodium (COLACE) 100 MG capsule Take 1 capsule (100 mg total) by mouth 2 (two) times daily. 40 capsule 0  . Ferrous Sulfate (IRON) 325 (65 Fe) MG TABS Take 1 tablet by mouth 2 (two) times daily.     Marland Kitchen HYDROcodone-acetaminophen (NORCO/VICODIN) 5-325 MG tablet Take 2 tablets by mouth every 6 (six) hours as needed. 30 tablet 0  . ibuprofen (ADVIL,MOTRIN) 200 MG tablet Take 400 mg by mouth every 6 (six) hours as needed for moderate pain.    Marland Kitchen insulin NPH-regular Human (NOVOLIN 70/30) (70-30) 100 UNIT/ML injection Inject 35 Units into the skin 2 (two) times daily with a meal. (Patient taking differently: Inject 20 Units into the skin 2 (two) times daily with a meal. ONLY IF CBG IS OVER 200.) 10 mL 11  . lidocaine-prilocaine (EMLA) cream APPLY 1 APPLICATION TOPICALLY AS NEEDED. 30 g 0  . ondansetron (ZOFRAN) 8 MG tablet TAKE 1 TABLET EVERY 8 HOURS AS NEEDED FOR NAUSEA AND VOMITING 20 tablet 1  . prochlorperazine (COMPAZINE) 10 MG tablet TAKE 1 TABLET (10 MG TOTAL) BY MOUTH EVERY 6 (SIX) HOURS AS NEEDED FOR NAUSEA OR VOMITING. 30 tablet 0  . simvastatin (ZOCOR) 40 MG tablet Take 1 tablet (40 mg total) by mouth daily. (Patient taking differently: Take 40 mg by mouth every morning. ) 30 tablet Keith  . temazepam (RESTORIL) 30 MG capsule TAKE ONE CAPSULE AT BEDTIME 30 capsule 0  . vitamin C (ASCORBIC ACID) 500 MG tablet Take 500 mg by mouth 2 (two) times daily.      No current facility-administered medications for this visit.     SURGICAL HISTORY:  Past Surgical History:  Procedure Laterality Date  . CATARACT EXTRACTION, BILATERAL  08/2012  . PILONIDAL CYST EXCISION N/A 10/17/2015   Procedure: EXCISION PILONIDAL DISEASE;  Surgeon: Leighton Ruff, MD;  Location: [facility];  Service: General;  Laterality:  N/A;  . PORTACATH PLACEMENT  01/30/2015  . RETINAL DETACHMENT SURGERY Bilateral 04/2012    REVIEW OF SYSTEMS:  Constitutional: positive for anorexia, fatigue and weight loss Eyes: negative Ears, nose, mouth, throat, and face: negative Respiratory: positive for cough and dyspnea on exertion Cardiovascular: negative Gastrointestinal: negative Genitourinary:negative Integument/breast: negative Hematologic/lymphatic: negative Musculoskeletal:positive for muscle weakness Neurological: negative Behavioral/Psych: positive for sleep disturbance Endocrine: negative Allergic/Immunologic: negative   PHYSICAL EXAMINATION: General appearance: alert, cooperative, fatigued and no distress Head: Normocephalic, without obvious abnormality, atraumatic Neck: no adenopathy, no JVD, supple, symmetrical, trachea midline and thyroid not enlarged, symmetric, no tenderness/mass/nodules Lymph nodes: Cervical, supraclavicular, and axillary nodes normal. Resp: diminished breath sounds LLL and dullness to percussion LLL Back: symmetric, no curvature. ROM normal. No  CVA tenderness. Cardio: regular rate and rhythm, S1, S2 normal, no murmur, click, rub or gallop GI: soft, non-tender; bowel sounds normal; no masses,  no organomegaly Extremities: extremities normal, atraumatic, no cyanosis or edema Neurologic: Alert and oriented X Keith, normal strength and tone. Normal symmetric reflexes. Normal coordination and gait  ECOG PERFORMANCE STATUS: 1 - Symptomatic but completely ambulatory  Blood pressure 135/89, pulse (!) 128, temperature 99.Keith F (37.4 C), temperature source Oral, resp. rate 18, weight 172 lb (78 kg), SpO2 98 %.  LABORATORY DATA: Lab Results  Component Value Date   WBC 11.5 (H) 03/16/2016   HGB 9.6 (L) 03/16/2016   HCT 30.Keith (L) 03/16/2016   MCV 69.1 (L) 03/16/2016   PLT 394 03/16/2016      Chemistry      Component Value Date/Time   NA 135 (L) 03/09/2016 1014   K 5.8 No visable hemolysis (H)  03/09/2016 1014   CL 111 11/19/2015 0448   CO2 21 (L) 03/09/2016 1014   BUN 37.4 (H) 03/09/2016 1014   CREATININE 1.9 (H) 03/09/2016 1014   GLU 396 (H) 10/28/2015 1249      Component Value Date/Time   CALCIUM 9.2 03/09/2016 1014   ALKPHOS 126 03/09/2016 1014   AST 14 03/09/2016 1014   ALT 13 03/09/2016 1014   BILITOT <0.22 03/09/2016 1014       RADIOGRAPHIC STUDIES: Ct Abdomen Pelvis Wo Contrast  Result Date: 03/03/2016 CLINICAL DATA:  56 year old Holloway with history of lung cancer diagnosed in 2016 undergoing ongoing chemotherapy. Chronic cough and shortness of breath for 1 month. Restaging examination. EXAM: CT CHEST, ABDOMEN AND PELVIS WITHOUT CONTRAST TECHNIQUE: Multidetector CT imaging of the chest, abdomen and pelvis was performed following the standard protocol without IV contrast. COMPARISON:  CT the chest, abdomen and pelvis 01/01/2016. FINDINGS: CT CHEST FINDINGS Cardiovascular: Heart size is normal. Small amount of pericardial fluid and/or thickening, similar to the prior study, unlikely to be of hemodynamic significance. No associated pericardial calcification. No atherosclerotic calcifications identified in the thoracic aorta or the coronary arteries. Right internal jugular single-lumen porta cath with tip terminating immediately right atrium. Mediastinum/Nodes: Bulky nodal mass in the anterior mediastinum poorly evaluated on today's noncontrast CT examination, but appearing larger than the prior study, estimated to measure approximately 4.6 x 7.7 cm, significantly increased compared to the prior examination (previously only Keith.9 x 6.6 cm). Subcarinal lymph nodes also appear increased in size measuring 11 mm in short axis. Esophagus is unremarkable in appearance. No axillary lymphadenopathy. Lungs/Pleura: Large left pleural effusion appears similar to the prior study and is associated with chronic areas of atelectasis in the left lung, including areas around the previously noted large  left lower lobe mass which appears chronically collapsed. Pleural nodularity is noted, with the largest pleural-based nodule measuring 11 x 21 mm (image 33 of series 2), larger and more well-defined than on the prior examination. Thickening of the peribronchovascular interstitium throughout the left lower lobe is again noted, potentially indicative of residual disease. Several areas of pleural-based nodularity are again noted in the left lung, particularly near the apex of the left upper lobe, with the largest of these nodules measures 18 x 17 mm (image 19 of series 4), increased compared to the prior examination, concerning for areas of potential lymphangitic spread of disease. Throughout the aerated portions of the lungs there are increasing areas of ground-glass attenuation, septal thickening and thickening of the peribronchovascular interstitium which are significantly increased compared to the prior examination. A few  scattered tiny 2-Keith mm pulmonary nodules are noted throughout the right lung, similar to the prior study, nonspecific, favored to represent areas of mucoid impaction. No other larger more suspicious appearing pulmonary nodules are confidently identified. Musculoskeletal: Area of sclerosis in the sternum, similar to prior studies, favored to represent postradiation changes. No other aggressive appearing lytic or blastic lesions are noted in the visualized portions of the skeleton. CT ABDOMEN PELVIS FINDINGS Hepatobiliary: Several ill-defined slightly low-attenuation lesions are noted in the liver, generally increased in size compared to the prior examination, concerning for metastatic disease, with the largest lesion measuring 2.9 x 2.Keith cm between segments 2 and Keith (previously only 1.9 cm) on image 54 of series 2. Unenhanced appearance of the gallbladder is unremarkable. Pancreas: No definite pancreatic mass or peripancreatic inflammatory changes are noted on today's noncontrast CT examination.  Spleen: Unremarkable. Adrenals/Urinary Tract: Normal appearance of the kidneys and bilateral adrenal glands. No hydroureteronephrosis. Urinary bladder is unremarkable in appearance. Stomach/Bowel: Unenhanced appearance of the stomach is unremarkable. No pathologic dilatation of small bowel or colon. The appendix is not confidently identified and may be surgically absent. Regardless, there are no inflammatory changes noted adjacent to the cecum to suggest the presence of an acute appendicitis at this time. Vascular/Lymphatic: Aortic atherosclerosis, without definite aneurysm in the abdominal or pelvic vasculature. No lymphadenopathy noted in the abdomen or pelvis. Reproductive: Prostate gland and seminal vesicles are unremarkable in appearance. Other: No significant volume of ascites.  No pneumoperitoneum. Musculoskeletal: There are no aggressive appearing lytic or blastic lesions noted in the visualized portions of the skeleton. IMPRESSION: 1. Today's study demonstrates progression of disease with interval enlargement of the previously noted large anterior mediastinal nodal mass, slight progression of what appears to be lymphangitic disease in the superior aspect of the left upper lobe, stable presumably malignant pleural effusion but with increasing pleural nodularity and enlarging liver lesions which likely represent metastatic lesions. 2. Interval development of ground-glass attenuation and septal thickening throughout the lungs bilaterally. This is nonspecific, but is concerning for potential drug reaction, developing interstitial lung disease, or noncardiogenic edema. Keith. Additional incidental findings, as above. Electronically Signed   By: Vinnie Langton M.D.   On: 03/03/2016 09:53   Ct Chest Wo Contrast  Result Date: 03/03/2016 CLINICAL DATA:  56 year old Holloway with history of lung cancer diagnosed in 2016 undergoing ongoing chemotherapy. Chronic cough and shortness of breath for 1 month. Restaging  examination. EXAM: CT CHEST, ABDOMEN AND PELVIS WITHOUT CONTRAST TECHNIQUE: Multidetector CT imaging of the chest, abdomen and pelvis was performed following the standard protocol without IV contrast. COMPARISON:  CT the chest, abdomen and pelvis 01/01/2016. FINDINGS: CT CHEST FINDINGS Cardiovascular: Heart size is normal. Small amount of pericardial fluid and/or thickening, similar to the prior study, unlikely to be of hemodynamic significance. No associated pericardial calcification. No atherosclerotic calcifications identified in the thoracic aorta or the coronary arteries. Right internal jugular single-lumen porta cath with tip terminating immediately right atrium. Mediastinum/Nodes: Bulky nodal mass in the anterior mediastinum poorly evaluated on today's noncontrast CT examination, but appearing larger than the prior study, estimated to measure approximately 4.6 x 7.7 cm, significantly increased compared to the prior examination (previously only Keith.9 x 6.6 cm). Subcarinal lymph nodes also appear increased in size measuring 11 mm in short axis. Esophagus is unremarkable in appearance. No axillary lymphadenopathy. Lungs/Pleura: Large left pleural effusion appears similar to the prior study and is associated with chronic areas of atelectasis in the left lung, including areas around the  previously noted large left lower lobe mass which appears chronically collapsed. Pleural nodularity is noted, with the largest pleural-based nodule measuring 11 x 21 mm (image 33 of series 2), larger and more well-defined than on the prior examination. Thickening of the peribronchovascular interstitium throughout the left lower lobe is again noted, potentially indicative of residual disease. Several areas of pleural-based nodularity are again noted in the left lung, particularly near the apex of the left upper lobe, with the largest of these nodules measures 18 x 17 mm (image 19 of series 4), increased compared to the prior  examination, concerning for areas of potential lymphangitic spread of disease. Throughout the aerated portions of the lungs there are increasing areas of ground-glass attenuation, septal thickening and thickening of the peribronchovascular interstitium which are significantly increased compared to the prior examination. A few scattered tiny 2-Keith mm pulmonary nodules are noted throughout the right lung, similar to the prior study, nonspecific, favored to represent areas of mucoid impaction. No other larger more suspicious appearing pulmonary nodules are confidently identified. Musculoskeletal: Area of sclerosis in the sternum, similar to prior studies, favored to represent postradiation changes. No other aggressive appearing lytic or blastic lesions are noted in the visualized portions of the skeleton. CT ABDOMEN PELVIS FINDINGS Hepatobiliary: Several ill-defined slightly low-attenuation lesions are noted in the liver, generally increased in size compared to the prior examination, concerning for metastatic disease, with the largest lesion measuring 2.9 x 2.Keith cm between segments 2 and Keith (previously only 1.9 cm) on image 54 of series 2. Unenhanced appearance of the gallbladder is unremarkable. Pancreas: No definite pancreatic mass or peripancreatic inflammatory changes are noted on today's noncontrast CT examination. Spleen: Unremarkable. Adrenals/Urinary Tract: Normal appearance of the kidneys and bilateral adrenal glands. No hydroureteronephrosis. Urinary bladder is unremarkable in appearance. Stomach/Bowel: Unenhanced appearance of the stomach is unremarkable. No pathologic dilatation of small bowel or colon. The appendix is not confidently identified and may be surgically absent. Regardless, there are no inflammatory changes noted adjacent to the cecum to suggest the presence of an acute appendicitis at this time. Vascular/Lymphatic: Aortic atherosclerosis, without definite aneurysm in the abdominal or pelvic  vasculature. No lymphadenopathy noted in the abdomen or pelvis. Reproductive: Prostate gland and seminal vesicles are unremarkable in appearance. Other: No significant volume of ascites.  No pneumoperitoneum. Musculoskeletal: There are no aggressive appearing lytic or blastic lesions noted in the visualized portions of the skeleton. IMPRESSION: 1. Today's study demonstrates progression of disease with interval enlargement of the previously noted large anterior mediastinal nodal mass, slight progression of what appears to be lymphangitic disease in the superior aspect of the left upper lobe, stable presumably malignant pleural effusion but with increasing pleural nodularity and enlarging liver lesions which likely represent metastatic lesions. 2. Interval development of ground-glass attenuation and septal thickening throughout the lungs bilaterally. This is nonspecific, but is concerning for potential drug reaction, developing interstitial lung disease, or noncardiogenic edema. Keith. Additional incidental findings, as above. Electronically Signed   By: Vinnie Langton M.D.   On: 03/03/2016 09:53    ASSESSMENT AND PLAN:  This is a very pleasant Keith years old African-American Holloway with: 1) metastatic non-small cell lung cancer, squamous cell carcinoma diagnosed in February 2016. The patient is status post several chemotherapy regimens and most recently treated with docetaxel and Cyramza for 14 cycles discontinued secondary to disease progression. I had a lengthy previous discussion with the patient about his current condition and treatment options. I strongly recommended for him to consider  palliative care and hospice but the patient declined and would like to consider further treatment. He is here today to start the first dose of chemotherapy with Navelbine 25 MG/M2 every 2 weeks. I recommended for the patient to proceed with the treatment as scheduled today. 2) dehydration and tachycardia: I will arrange for  the patient to receive 1 L of normal saline in the chemotherapy treatment area today. Keith) pain management: His pain in the chest is most likely secondary to the disease progression of the left upper lobe lung mass. I will start the patient on Percocet 5/325 mg by mouth every 4 hours as needed. 4) insomnia: I gave the patient a refill of her history today. 5) diabetes mellitus: He will continue his current treatment under the care of his primary care physician. 6) chronic renal insufficiency: Stable we will continue to monitor for now. The patient would come back for follow-up visit in 2 weeks before starting cycle #2. He was advised to call immediately if he has any concerning symptoms in the interval. The patient voices understanding of current disease status and treatment options and is in agreement with the current care plan.  All questions were answered. The patient knows to call the clinic with any problems, questions or concerns. We can certainly see the patient much sooner if necessary.  Disclaimer: This note was dictated with voice recognition software. Similar sounding words can inadvertently be transcribed and may not be corrected upon review.

## 2016-03-16 NOTE — Patient Instructions (Signed)
[facility] Discharge Instructions for Patients Receiving Chemotherapy  Today you received the following chemotherapy agents Navelbine  To help prevent nausea and vomiting after your treatment, we encourage you to take your nausea medication as directed.   If you develop nausea and vomiting that is not controlled by your nausea medication, call the clinic.   BELOW ARE SYMPTOMS THAT SHOULD BE REPORTED IMMEDIATELY:  *FEVER GREATER THAN 100.5 F  *CHILLS WITH OR WITHOUT FEVER  NAUSEA AND VOMITING THAT IS NOT CONTROLLED WITH YOUR NAUSEA MEDICATION  *UNUSUAL SHORTNESS OF BREATH  *UNUSUAL BRUISING OR BLEEDING  TENDERNESS IN MOUTH AND THROAT WITH OR WITHOUT PRESENCE OF ULCERS  *URINARY PROBLEMS  *BOWEL PROBLEMS  UNUSUAL RASH Items with * indicate a potential emergency and should be followed up as soon as possible.  Feel free to call the clinic you have any questions or concerns. The clinic phone number is [phone].  Please show the [facility] at check-in to the Emergency Department and triage nurse.

## 2016-03-23 ENCOUNTER — Other Ambulatory Visit: Payer: Medicare Other

## 2016-03-29 ENCOUNTER — Other Ambulatory Visit: Payer: Self-pay

## 2016-03-29 DIAGNOSIS — C3492 Malignant neoplasm of unspecified part of left bronchus or lung: Secondary | ICD-10-CM

## 2016-03-30 ENCOUNTER — Ambulatory Visit (HOSPITAL_BASED_OUTPATIENT_CLINIC_OR_DEPARTMENT_OTHER): Payer: Medicare Other

## 2016-03-30 ENCOUNTER — Ambulatory Visit: Payer: Medicare Other

## 2016-03-30 ENCOUNTER — Ambulatory Visit (HOSPITAL_BASED_OUTPATIENT_CLINIC_OR_DEPARTMENT_OTHER): Payer: Medicare Other | Admitting: Internal Medicine

## 2016-03-30 ENCOUNTER — Ambulatory Visit: Payer: Medicare Other | Admitting: Internal Medicine

## 2016-03-30 ENCOUNTER — Telehealth: Payer: Self-pay | Admitting: Internal Medicine

## 2016-03-30 ENCOUNTER — Other Ambulatory Visit (HOSPITAL_BASED_OUTPATIENT_CLINIC_OR_DEPARTMENT_OTHER): Payer: Medicare Other

## 2016-03-30 ENCOUNTER — Other Ambulatory Visit: Payer: Medicare Other

## 2016-03-30 ENCOUNTER — Encounter: Payer: Self-pay | Admitting: Internal Medicine

## 2016-03-30 DIAGNOSIS — R05 Cough: Secondary | ICD-10-CM

## 2016-03-30 DIAGNOSIS — Z5111 Encounter for antineoplastic chemotherapy: Secondary | ICD-10-CM

## 2016-03-30 DIAGNOSIS — N183 Chronic kidney disease, stage 3 (moderate): Secondary | ICD-10-CM | POA: Diagnosis not present

## 2016-03-30 DIAGNOSIS — I10 Essential (primary) hypertension: Secondary | ICD-10-CM

## 2016-03-30 DIAGNOSIS — C3432 Malignant neoplasm of lower lobe, left bronchus or lung: Secondary | ICD-10-CM

## 2016-03-30 DIAGNOSIS — C3492 Malignant neoplasm of unspecified part of left bronchus or lung: Secondary | ICD-10-CM

## 2016-03-30 DIAGNOSIS — R059 Cough, unspecified: Secondary | ICD-10-CM

## 2016-03-30 DIAGNOSIS — C787 Secondary malignant neoplasm of liver and intrahepatic bile duct: Secondary | ICD-10-CM

## 2016-03-30 LAB — CBC WITH DIFFERENTIAL/PLATELET
BASO%: 0.5 % (ref 0.0–2.0)
BASOS ABS: 0 10*3/uL (ref 0.0–0.1)
EOS ABS: 0.1 10*3/uL (ref 0.0–0.5)
EOS%: 1.9 % (ref 0.0–7.0)
HEMATOCRIT: 28.1 % — AB (ref 38.4–49.9)
HGB: 9.1 g/dL — ABNORMAL LOW (ref 13.0–17.1)
LYMPH#: 1.1 10*3/uL (ref 0.9–3.3)
LYMPH%: 14.9 % (ref 14.0–49.0)
MCH: 21.5 pg — ABNORMAL LOW (ref 27.2–33.4)
MCHC: 32.2 g/dL (ref 32.0–36.0)
MCV: 67 fL — AB (ref 79.3–98.0)
MONO#: 1 10*3/uL — AB (ref 0.1–0.9)
MONO%: 12.7 % (ref 0.0–14.0)
NEUT#: 5.3 10*3/uL (ref 1.5–6.5)
NEUT%: 70 % (ref 39.0–75.0)
PLATELETS: 349 10*3/uL (ref 140–400)
RBC: 4.2 10*6/uL (ref 4.20–5.82)
RDW: 19.4 % — AB (ref 11.0–14.6)
WBC: 7.6 10*3/uL (ref 4.0–10.3)

## 2016-03-30 LAB — COMPREHENSIVE METABOLIC PANEL
ALBUMIN: 2.9 g/dL — AB (ref 3.5–5.0)
ALK PHOS: 115 U/L (ref 40–150)
ALT: 17 U/L (ref 0–55)
ANION GAP: 8 meq/L (ref 3–11)
AST: 19 U/L (ref 5–34)
BILIRUBIN TOTAL: 0.24 mg/dL (ref 0.20–1.20)
BUN: 20.3 mg/dL (ref 7.0–26.0)
CALCIUM: 9.3 mg/dL (ref 8.4–10.4)
CO2: 25 mEq/L (ref 22–29)
Chloride: 107 mEq/L (ref 98–109)
Creatinine: 1.5 mg/dL — ABNORMAL HIGH (ref 0.7–1.3)
EGFR: 62 mL/min/{1.73_m2} — AB (ref >90)
Glucose: 258 mg/dl — ABNORMAL HIGH (ref 70–140)
POTASSIUM: 5 meq/L (ref 3.5–5.1)
Sodium: 140 mEq/L (ref 136–145)
TOTAL PROTEIN: 6.7 g/dL (ref 6.4–8.3)

## 2016-03-30 MED ORDER — SODIUM CHLORIDE 0.9 % IV SOLN
Freq: Once | INTRAVENOUS | Status: AC
  Administered 2016-03-30: 11:00:00 via INTRAVENOUS

## 2016-03-30 MED ORDER — BENZONATATE 100 MG PO CAPS: 100.0000 mg | ORAL_CAPSULE | Freq: Three times a day (TID) | ORAL | 0 refills | Status: DC | PRN

## 2016-03-30 MED ORDER — PROCHLORPERAZINE MALEATE 10 MG PO TABS
10.0000 mg | ORAL_TABLET | Freq: Once | ORAL | Status: AC
  Administered 2016-03-30: 10 mg via ORAL

## 2016-03-30 MED ORDER — SODIUM CHLORIDE 0.9% FLUSH
10.0000 mL | INTRAVENOUS | Status: DC | PRN
  Administered 2016-03-30: 10 mL
  Filled 2016-03-30: qty 10

## 2016-03-30 MED ORDER — PROCHLORPERAZINE MALEATE 10 MG PO TABS
ORAL_TABLET | ORAL | Status: AC
  Filled 2016-03-30: qty 1

## 2016-03-30 MED ORDER — HEPARIN SOD (PORK) LOCK FLUSH 100 UNIT/ML IV SOLN
500.0000 [IU] | Freq: Once | INTRAVENOUS | Status: AC | PRN
  Administered 2016-03-30: 500 [IU]
  Filled 2016-03-30: qty 5

## 2016-03-30 MED ORDER — VINORELBINE TARTRATE CHEMO INJECTION 50 MG/5ML
25.0000 mg/m2 | Freq: Once | INTRAVENOUS | Status: AC
  Administered 2016-03-30: 50 mg via INTRAVENOUS
  Filled 2016-03-30: qty 5

## 2016-03-30 NOTE — Patient Instructions (Signed)
[facility] Discharge Instructions for Patients Receiving Chemotherapy  Today you received the following chemotherapy agents:  Venorelbine  To help prevent nausea and vomiting after your treatment, we encourage you to take your nausea medication as prescribed.   If you develop nausea and vomiting that is not controlled by your nausea medication, call the clinic.   BELOW ARE SYMPTOMS THAT SHOULD BE REPORTED IMMEDIATELY:  *FEVER GREATER THAN 100.5 F  *CHILLS WITH OR WITHOUT FEVER  NAUSEA AND VOMITING THAT IS NOT CONTROLLED WITH YOUR NAUSEA MEDICATION  *UNUSUAL SHORTNESS OF BREATH  *UNUSUAL BRUISING OR BLEEDING  TENDERNESS IN MOUTH AND THROAT WITH OR WITHOUT PRESENCE OF ULCERS  *URINARY PROBLEMS  *BOWEL PROBLEMS  UNUSUAL RASH Items with * indicate a potential emergency and should be followed up as soon as possible.  Feel free to call the clinic you have any questions or concerns. The clinic phone number is [phone].  Please show the [facility] at check-in to the Emergency Department and triage nurse.

## 2016-03-30 NOTE — Progress Notes (Signed)
[place] [phone]) [phone]   [phone]  OFFICE PROGRESS NOTE  [name], MD [place] [address]  DIAGNOSIS: Stage IV (T2a, N3, M1b) non-small cell lung cancer, squamous cell carcinoma presented with left lower lobe lung mass in April 2016.  PRIOR THERAPY: 1) Systemic chemotherapy with carboplatin for AUC of 5 given on day 1 and gemcitabine 1000 MG/M2 on days 1 and 8 every 3 weeks. Status post 6 cycles with partial response. 2) Nivolumab 240 MG IV every 2 weeks status post 4 cycles, last dose was given 03/18/2015 discontinued today secondary to disease progression. 3) systemic chemotherapy with docetaxel 75 MG/M2 and Cyramza 10 MG/KG every 3 weeks with Neulasta support status post 14 cycles. First dose was given 04/08/2015. Last dose was given 02/17/2016 discontinued secondary to disease progression.  CURRENT THERAPY: Navelbine 25 MG/M2 every 2 weeks, first dose 03/16/2016. Status post one cycle  INTERVAL HISTORY: Keith Holloway 56 y.o. male came to the clinic today for follow-up visit accompanied by his wife. He tolerated the first cycle of his systemic chemotherapy with Navelbine fairly well. He denied having any nausea or vomiting. He has no fever or chills. He continues to have intermittent chest pain from the progressive tumor as well as fatigue and weakness. He denied having any weight loss or night sweats. He is here today for evaluation before starting cycle #2.  MEDICAL HISTORY: Past Medical History:  Diagnosis Date  . Acute hyperglycemia 11/26/2015  . Alcohol abuse   . Arthritis   . Borderline hypertension   . CHF (congestive heart failure) ([place])   . CKD (chronic kidney disease), stage III   . Cocaine abuse    last [employer] post[employer] for cocaine 09-30-2015  . Depression   . Dyspnea on effort   . Elevated blood sugar 11/26/2015  . Goals of care, counseling/discussion 03/09/2016  . History of acute renal failure    09-30-2015  . History of hyperkalemia    admission 09-30-2015 potassiom 8.0  with medications currently 10-14-2015 down to 4.7 normal  . History of traumatic head injury    1987  w/ LOC   --- no residual  . Hyperglycemia 02/17/2016  . Hyperlipidemia   . Hypertension 12/16/2015  . 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 Memory disorder 08/22/2013   alcohol abuse  . Migraine   . Nocturia   . Non-small cell carcinoma of lung, stage 4 [facility]) oncologist-  dr Julien Nordmann   w/ METs to liver dx 04/ 2016--  Stage IV (T2a, N3, M1b), squamous cell carcinoma of left lower lobe---  systemic chemotherapy every 3 weeks and currently due to evidence for disease progression has started immunotherapy  . Pilonidal abscess    recurrent  . Pleural effusion, bilateral    per ct 10-09-2015  new small right effusion and left small to moderate effusion , increased  . Polyneuropathy in diabetes(357.2)   . Port-a-cath in place   . Pulmonary nodules/lesions, multiple    pt has stage 4 lung cancer  . Recurrent left pleural effusion 01/06/2016  . Tobacco abuse counseling 10/09/2014  . Type 2 diabetes mellitus with insulin therapy ([place])   . Wears dentures    upper    ALLERGIES:  has No Known Allergies.  MEDICATIONS:  Current Outpatient Prescriptions  Medication Sig Dispense Refill  . acetaminophen (TYLENOL) 325 MG tablet Take 2 tablets (650 mg total) by mouth  every 6 (six) hours as needed for mild pain (or Fever >/= 101). 30 tablet 0  . amLODipine (NORVASC) 10 MG tablet Take 1 tablet (10 mg total) by mouth daily. 30 tablet 1  . aspirin EC 81 MG tablet Take 1 tablet by mouth every morning.     . benzonatate (TESSALON) 100 MG capsule TAKE ONE CAPSULE BY MOUTH 3 TIMES A DAY AS NEEDED FOR COUGH 20 capsule 0  . Cholecalciferol (VITAMIN D-3) 1000 units CAPS Take 1,000 Units by mouth daily.    Marland Kitchen dexamethasone (DECADRON) 4 MG tablet 2 TABLETS BY MOUTH TWICE A DAY THE DAY  BEFORE, DAY OF AND DAY AFTER THE CHEMOTHERAPY EVERY 3 WEEKS 40 tablet 1  . docusate sodium (COLACE) 100 MG capsule Take 1 capsule (100 mg total) by mouth 2 (two) times daily. 40 capsule 0  . Ferrous Sulfate (IRON) 325 (65 Fe) MG TABS Take 1 tablet by mouth 2 (two) times daily.     Marland Kitchen HYDROcodone-acetaminophen (NORCO/VICODIN) 5-325 MG tablet Take 2 tablets by mouth every 6 (six) hours as needed. 30 tablet 0  . ibuprofen (ADVIL,MOTRIN) 200 MG tablet Take 400 mg by mouth every 6 (six) hours as needed for moderate pain.    Marland Kitchen insulin NPH-regular Human (NOVOLIN 70/30) (70-30) 100 UNIT/ML injection Inject 35 Units into the skin 2 (two) times daily with a meal. (Patient taking differently: Inject 20 Units into the skin 2 (two) times daily with a meal. ONLY IF CBG IS OVER 200.) 10 mL 11  . lidocaine-prilocaine (EMLA) cream APPLY 1 APPLICATION TOPICALLY AS NEEDED. 30 g 0  . oxyCODONE-acetaminophen (PERCOCET/ROXICET) 5-325 MG tablet Take 1 tablet by mouth every 4 (four) hours as needed for severe pain. 60 tablet 0  . simvastatin (ZOCOR) 40 MG tablet Take 1 tablet (40 mg total) by mouth daily. (Patient taking differently: Take 40 mg by mouth every morning. ) 30 tablet 3  . vitamin C (ASCORBIC ACID) 500 MG tablet Take 500 mg by mouth 2 (two) times daily.     . ondansetron (ZOFRAN) 8 MG tablet TAKE 1 TABLET EVERY 8 HOURS AS NEEDED FOR NAUSEA AND VOMITING (Patient not taking: Reported on 03/30/2016) 20 tablet 1  . prochlorperazine (COMPAZINE) 10 MG tablet TAKE 1 TABLET (10 MG TOTAL) BY MOUTH EVERY 6 (SIX) HOURS AS NEEDED FOR NAUSEA OR VOMITING. (Patient not taking: Reported on 03/16/2016) 30 tablet 0  . temazepam (RESTORIL) 30 MG capsule TAKE ONE CAPSULE AT BEDTIME (Patient not taking: Reported on 03/16/2016) 30 capsule 0   No current facility-administered medications for this visit.     SURGICAL HISTORY:  Past Surgical History:  Procedure Laterality Date  . CATARACT EXTRACTION, BILATERAL  08/2012  . PILONIDAL  CYST EXCISION N/A 10/17/2015   Procedure: EXCISION PILONIDAL DISEASE;  Surgeon: Leighton Ruff, MD;  [facility]: [facility];  Service: General;  Laterality: N/A;  . PORTACATH PLACEMENT  01/30/2015  . RETINAL DETACHMENT SURGERY Bilateral 04/2012    REVIEW OF SYSTEMS:  A comprehensive review of systems was negative except for: Constitutional: positive for fatigue Respiratory: positive for dyspnea on exertion and pleurisy/chest pain Musculoskeletal: positive for arthralgias and bone pain   PHYSICAL EXAMINATION: General appearance: alert, cooperative, fatigued and no distress Head: Normocephalic, without obvious abnormality, atraumatic Neck: no adenopathy, no JVD, supple, symmetrical, trachea midline and thyroid not enlarged, symmetric, no tenderness/mass/nodules Lymph nodes: Cervical, supraclavicular, and axillary nodes normal. Resp: wheezes bilaterally Back: symmetric, no curvature. ROM normal. No CVA tenderness. Cardio: regular rate and  rhythm, S1, S2 normal, no murmur, click, rub or gallop GI: soft, non-tender; bowel sounds normal; no masses,  no organomegaly Extremities: extremities normal, atraumatic, no cyanosis or edema  ECOG PERFORMANCE STATUS: 1 - Symptomatic but completely ambulatory  Blood pressure (!) 131/91, pulse (!) 106, temperature 98.3 F (36.8 C), temperature source Oral, resp. rate 18, height '5\' 11"'$  (1.803 m), weight 179 lb 1.6 oz (81.2 kg), SpO2 94 %.  LABORATORY DATA: Lab Results  Component Value Date   WBC 7.6 03/30/2016   HGB 9.1 (L) 03/30/2016   HCT 28.1 (L) 03/30/2016   MCV 67.0 (L) 03/30/2016   PLT 349 03/30/2016      Chemistry      Component Value Date/Time   NA 140 03/30/2016 0844   K 5.0 03/30/2016 0844   CL 111 11/19/2015 0448   CO2 25 03/30/2016 0844   BUN 20.3 03/30/2016 0844   CREATININE 1.5 (H) 03/30/2016 0844   GLU 396 (H) 10/28/2015 1249      Component Value Date/Time   CALCIUM 9.3 03/30/2016 0844   ALKPHOS 115 03/30/2016  0844   AST 19 03/30/2016 0844   ALT 17 03/30/2016 0844   BILITOT 0.24 03/30/2016 0844       RADIOGRAPHIC STUDIES: Ct Abdomen Pelvis Wo Contrast  Result Date: 03/03/2016 CLINICAL DATA:  56 year old male with history of lung cancer diagnosed in 2016 undergoing ongoing chemotherapy. Chronic cough and shortness of breath for 1 month. Restaging examination. EXAM: CT CHEST, ABDOMEN AND PELVIS WITHOUT CONTRAST TECHNIQUE: Multidetector CT imaging of the chest, abdomen and pelvis was performed following the standard protocol without IV contrast. COMPARISON:  CT the chest, abdomen and pelvis 01/01/2016. FINDINGS: CT CHEST FINDINGS Cardiovascular: Heart size is normal. Small amount of pericardial fluid and/or thickening, similar to the prior study, unlikely to be of hemodynamic significance. No associated pericardial calcification. No atherosclerotic calcifications identified in the thoracic aorta or the coronary arteries. Right internal jugular single-lumen porta cath with tip terminating immediately right atrium. Mediastinum/Nodes: Bulky nodal mass in the anterior mediastinum poorly evaluated on today's noncontrast CT examination, but appearing larger than the prior study, estimated to measure approximately 4.6 x 7.7 cm, significantly increased compared to the prior examination (previously only 3.9 x 6.6 cm). Subcarinal lymph nodes also appear increased in size measuring 11 mm in short axis. Esophagus is unremarkable in appearance. No axillary lymphadenopathy. Lungs/Pleura: Large left pleural effusion appears similar to the prior study and is associated with chronic areas of atelectasis in the left lung, including areas around the previously noted large left lower lobe mass which appears chronically collapsed. Pleural nodularity is noted, with the largest pleural-based nodule measuring 11 x 21 mm (image 33 of series 2), larger and more well-defined than on the prior examination. Thickening of the  peribronchovascular interstitium throughout the left lower lobe is again noted, potentially indicative of residual disease. Several areas of pleural-based nodularity are again noted in the left lung, particularly near the apex of the left upper lobe, with the largest of these nodules measures 18 x 17 mm (image 19 of series 4), increased compared to the prior examination, concerning for areas of potential lymphangitic spread of disease. Throughout the aerated portions of the lungs there are increasing areas of ground-glass attenuation, septal thickening and thickening of the peribronchovascular interstitium which are significantly increased compared to the prior examination. A few scattered tiny 2-3 mm pulmonary nodules are noted throughout the right lung, similar to the prior study, nonspecific, favored to represent areas of  mucoid impaction. No other larger more suspicious appearing pulmonary nodules are confidently identified. Musculoskeletal: Area of sclerosis in the sternum, similar to prior studies, favored to represent postradiation changes. No other aggressive appearing lytic or blastic lesions are noted in the visualized portions of the skeleton. CT ABDOMEN PELVIS FINDINGS Hepatobiliary: Several ill-defined slightly low-attenuation lesions are noted in the liver, generally increased in size compared to the prior examination, concerning for metastatic disease, with the largest lesion measuring 2.9 x 2.3 cm between segments 2 and 3 (previously only 1.9 cm) on image 54 of series 2. Unenhanced appearance of the gallbladder is unremarkable. Pancreas: No definite pancreatic mass or peripancreatic inflammatory changes are noted on today's noncontrast CT examination. Spleen: Unremarkable. Adrenals/Urinary Tract: Normal appearance of the kidneys and bilateral adrenal glands. No hydroureteronephrosis. Urinary bladder is unremarkable in appearance. Stomach/Bowel: Unenhanced appearance of the stomach is unremarkable. No  pathologic dilatation of small bowel or colon. The appendix is not confidently identified and may be surgically absent. Regardless, there are no inflammatory changes noted adjacent to the cecum to suggest the presence of an acute appendicitis at this time. Vascular/Lymphatic: Aortic atherosclerosis, without definite aneurysm in the abdominal or pelvic vasculature. No lymphadenopathy noted in the abdomen or pelvis. Reproductive: Prostate gland and seminal vesicles are unremarkable in appearance. Other: No significant volume of ascites.  No pneumoperitoneum. Musculoskeletal: There are no aggressive appearing lytic or blastic lesions noted in the visualized portions of the skeleton. IMPRESSION: 1. Today's study demonstrates progression of disease with interval enlargement of the previously noted large anterior mediastinal nodal mass, slight progression of what appears to be lymphangitic disease in the superior aspect of the left upper lobe, stable presumably malignant pleural effusion but with increasing pleural nodularity and enlarging liver lesions which likely represent metastatic lesions. 2. Interval development of ground-glass attenuation and septal thickening throughout the lungs bilaterally. This is nonspecific, but is concerning for potential drug reaction, developing interstitial lung disease, or noncardiogenic edema. 3. Additional incidental findings, as above. Electronically Signed   By: Vinnie Langton M.D.   On: 03/03/2016 09:53   Ct Chest Wo Contrast  Result Date: 03/03/2016 CLINICAL DATA:  56 year old male with history of lung cancer diagnosed in 2016 undergoing ongoing chemotherapy. Chronic cough and shortness of breath for 1 month. Restaging examination. EXAM: CT CHEST, ABDOMEN AND PELVIS WITHOUT CONTRAST TECHNIQUE: Multidetector CT imaging of the chest, abdomen and pelvis was performed following the standard protocol without IV contrast. COMPARISON:  CT the chest, abdomen and pelvis 01/01/2016.  FINDINGS: CT CHEST FINDINGS Cardiovascular: Heart size is normal. Small amount of pericardial fluid and/or thickening, similar to the prior study, unlikely to be of hemodynamic significance. No associated pericardial calcification. No atherosclerotic calcifications identified in the thoracic aorta or the coronary arteries. Right internal jugular single-lumen porta cath with tip terminating immediately right atrium. Mediastinum/Nodes: Bulky nodal mass in the anterior mediastinum poorly evaluated on today's noncontrast CT examination, but appearing larger than the prior study, estimated to measure approximately 4.6 x 7.7 cm, significantly increased compared to the prior examination (previously only 3.9 x 6.6 cm). Subcarinal lymph nodes also appear increased in size measuring 11 mm in short axis. Esophagus is unremarkable in appearance. No axillary lymphadenopathy. Lungs/Pleura: Large left pleural effusion appears similar to the prior study and is associated with chronic areas of atelectasis in the left lung, including areas around the previously noted large left lower lobe mass which appears chronically collapsed. Pleural nodularity is noted, with the largest pleural-based nodule measuring 11 x  21 mm (image 33 of series 2), larger and more well-defined than on the prior examination. Thickening of the peribronchovascular interstitium throughout the left lower lobe is again noted, potentially indicative of residual disease. Several areas of pleural-based nodularity are again noted in the left lung, particularly near the apex of the left upper lobe, with the largest of these nodules measures 18 x 17 mm (image 19 of series 4), increased compared to the prior examination, concerning for areas of potential lymphangitic spread of disease. Throughout the aerated portions of the lungs there are increasing areas of ground-glass attenuation, septal thickening and thickening of the peribronchovascular interstitium which are  significantly increased compared to the prior examination. A few scattered tiny 2-3 mm pulmonary nodules are noted throughout the right lung, similar to the prior study, nonspecific, favored to represent areas of mucoid impaction. No other larger more suspicious appearing pulmonary nodules are confidently identified. Musculoskeletal: Area of sclerosis in the sternum, similar to prior studies, favored to represent postradiation changes. No other aggressive appearing lytic or blastic lesions are noted in the visualized portions of the skeleton. CT ABDOMEN PELVIS FINDINGS Hepatobiliary: Several ill-defined slightly low-attenuation lesions are noted in the liver, generally increased in size compared to the prior examination, concerning for metastatic disease, with the largest lesion measuring 2.9 x 2.3 cm between segments 2 and 3 (previously only 1.9 cm) on image 54 of series 2. Unenhanced appearance of the gallbladder is unremarkable. Pancreas: No definite pancreatic mass or peripancreatic inflammatory changes are noted on today's noncontrast CT examination. Spleen: Unremarkable. Adrenals/Urinary Tract: Normal appearance of the kidneys and bilateral adrenal glands. No hydroureteronephrosis. Urinary bladder is unremarkable in appearance. Stomach/Bowel: Unenhanced appearance of the stomach is unremarkable. No pathologic dilatation of small bowel or colon. The appendix is not confidently identified and may be surgically absent. Regardless, there are no inflammatory changes noted adjacent to the cecum to suggest the presence of an acute appendicitis at this time. Vascular/Lymphatic: Aortic atherosclerosis, without definite aneurysm in the abdominal or pelvic vasculature. No lymphadenopathy noted in the abdomen or pelvis. Reproductive: Prostate gland and seminal vesicles are unremarkable in appearance. Other: No significant volume of ascites.  No pneumoperitoneum. Musculoskeletal: There are no aggressive appearing lytic or  blastic lesions noted in the visualized portions of the skeleton. IMPRESSION: 1. Today's study demonstrates progression of disease with interval enlargement of the previously noted large anterior mediastinal nodal mass, slight progression of what appears to be lymphangitic disease in the superior aspect of the left upper lobe, stable presumably malignant pleural effusion but with increasing pleural nodularity and enlarging liver lesions which likely represent metastatic lesions. 2. Interval development of ground-glass attenuation and septal thickening throughout the lungs bilaterally. This is nonspecific, but is concerning for potential drug reaction, developing interstitial lung disease, or noncardiogenic edema. 3. Additional incidental findings, as above. Electronically Signed   By: Vinnie Langton M.D.   On: 03/03/2016 09:53    ASSESSMENT AND PLAN:  This is a very pleasant 56 years old African-American male with metastatic non-small cell lung cancer, squamous cell carcinoma status post several chemotherapy regimens recently was on docetaxel and surrounds the discontinued secondary to disease progression. He is currently undergoing treatment with Navelbine every 2 weeks is status post 1 cycle. He tolerated the first cycle of his treatment well with no significant adverse effects. I recommended for him to proceed with cycle #2 today as scheduled. I will see him back for follow-up visit in 2 weeks for evaluation before starting cycle #3.  He was advised to call immediately if he has any concerning symptoms in the interval. I gave him a refill for Tessalon for the cough as needed. The patient voices understanding of current disease status and treatment options and is in agreement with the current care plan.  All questions were answered. The patient knows to call the clinic with any problems, questions or concerns. We can certainly see the patient much sooner if necessary. I spent 10 minutes counseling  the patient face to face. The total time spent in the appointment was 15 minutes.  Disclaimer: This note was dictated with voice recognition software. Similar sounding words can inadvertently be transcribed and may not be corrected upon review.

## 2016-03-30 NOTE — Telephone Encounter (Signed)
Patient bypassed scheduling area. No additional appointments are required, as per multiple appointments scheduled prior. 03/30/16

## 2016-04-01 ENCOUNTER — Ambulatory Visit: Payer: Medicare Other

## 2016-04-06 ENCOUNTER — Other Ambulatory Visit: Payer: Medicare Other

## 2016-04-13 ENCOUNTER — Ambulatory Visit (HOSPITAL_BASED_OUTPATIENT_CLINIC_OR_DEPARTMENT_OTHER): Payer: Medicare Other

## 2016-04-13 ENCOUNTER — Other Ambulatory Visit: Payer: Medicare Other

## 2016-04-13 ENCOUNTER — Encounter: Payer: Self-pay | Admitting: Internal Medicine

## 2016-04-13 ENCOUNTER — Ambulatory Visit (HOSPITAL_BASED_OUTPATIENT_CLINIC_OR_DEPARTMENT_OTHER): Payer: Medicare Other | Admitting: Internal Medicine

## 2016-04-13 ENCOUNTER — Other Ambulatory Visit (HOSPITAL_BASED_OUTPATIENT_CLINIC_OR_DEPARTMENT_OTHER): Payer: Medicare Other

## 2016-04-13 DIAGNOSIS — C3432 Malignant neoplasm of lower lobe, left bronchus or lung: Secondary | ICD-10-CM

## 2016-04-13 DIAGNOSIS — C3492 Malignant neoplasm of unspecified part of left bronchus or lung: Secondary | ICD-10-CM

## 2016-04-13 DIAGNOSIS — C787 Secondary malignant neoplasm of liver and intrahepatic bile duct: Secondary | ICD-10-CM

## 2016-04-13 DIAGNOSIS — Z5111 Encounter for antineoplastic chemotherapy: Secondary | ICD-10-CM | POA: Diagnosis not present

## 2016-04-13 DIAGNOSIS — F5102 Adjustment insomnia: Secondary | ICD-10-CM | POA: Diagnosis not present

## 2016-04-13 DIAGNOSIS — N182 Chronic kidney disease, stage 2 (mild): Secondary | ICD-10-CM

## 2016-04-13 DIAGNOSIS — E860 Dehydration: Secondary | ICD-10-CM

## 2016-04-13 LAB — COMPREHENSIVE METABOLIC PANEL WITH GFR
ALT: 15 U/L (ref 0–55)
AST: 15 U/L (ref 5–34)
Albumin: 3.1 g/dL — ABNORMAL LOW (ref 3.5–5.0)
Alkaline Phosphatase: 144 U/L (ref 40–150)
Anion Gap: 8 meq/L (ref 3–11)
BUN: 26.6 mg/dL — ABNORMAL HIGH (ref 7.0–26.0)
CO2: 25 meq/L (ref 22–29)
Calcium: 9.7 mg/dL (ref 8.4–10.4)
Chloride: 108 meq/L (ref 98–109)
Creatinine: 1.5 mg/dL — ABNORMAL HIGH (ref 0.7–1.3)
EGFR: 62 ml/min/1.73 m2 — ABNORMAL LOW
Glucose: 236 mg/dL — ABNORMAL HIGH (ref 70–140)
Potassium: 4.6 meq/L (ref 3.5–5.1)
Sodium: 141 meq/L (ref 136–145)
Total Bilirubin: <0.22 mg/dL (ref 0.20–1.20)
Total Protein: 7.4 g/dL (ref 6.4–8.3)

## 2016-04-13 LAB — CBC WITH DIFFERENTIAL/PLATELET
BASO%: 0.9 % (ref 0.0–2.0)
Basophils Absolute: 0.1 10e3/uL (ref 0.0–0.1)
EOS%: 0.8 % (ref 0.0–7.0)
Eosinophils Absolute: 0.1 10e3/uL (ref 0.0–0.5)
HCT: 28.7 % — ABNORMAL LOW (ref 38.4–49.9)
HGB: 9.3 g/dL — ABNORMAL LOW (ref 13.0–17.1)
LYMPH%: 10.6 % — ABNORMAL LOW (ref 14.0–49.0)
MCH: 20.9 pg — ABNORMAL LOW (ref 27.2–33.4)
MCHC: 32.2 g/dL (ref 32.0–36.0)
MCV: 65 fL — ABNORMAL LOW (ref 79.3–98.0)
MONO#: 1.2 10e3/uL — ABNORMAL HIGH (ref 0.1–0.9)
MONO%: 11.2 % (ref 0.0–14.0)
NEUT#: 7.9 10e3/uL — ABNORMAL HIGH (ref 1.5–6.5)
NEUT%: 76.5 % — ABNORMAL HIGH (ref 39.0–75.0)
Platelets: 374 10e3/uL (ref 140–400)
RBC: 4.42 10e6/uL (ref 4.20–5.82)
RDW: 20.7 % — ABNORMAL HIGH (ref 11.0–14.6)
WBC: 10.3 10e3/uL (ref 4.0–10.3)
lymph#: 1.1 10e3/uL (ref 0.9–3.3)

## 2016-04-13 MED ORDER — SODIUM CHLORIDE 0.9 % IV SOLN
Freq: Once | INTRAVENOUS | Status: AC
  Administered 2016-04-13: 10:00:00 via INTRAVENOUS

## 2016-04-13 MED ORDER — PROCHLORPERAZINE MALEATE 10 MG PO TABS
10.0000 mg | ORAL_TABLET | Freq: Once | ORAL | Status: AC
  Administered 2016-04-13: 10 mg via ORAL

## 2016-04-13 MED ORDER — HEPARIN SOD (PORK) LOCK FLUSH 100 UNIT/ML IV SOLN
500.0000 [IU] | Freq: Once | INTRAVENOUS | Status: AC | PRN
  Administered 2016-04-13: 500 [IU]
  Filled 2016-04-13: qty 5

## 2016-04-13 MED ORDER — SODIUM CHLORIDE 0.9 % IV SOLN
25.0000 mg/m2 | Freq: Once | INTRAVENOUS | Status: AC
  Administered 2016-04-13: 50 mg via INTRAVENOUS
  Filled 2016-04-13: qty 5

## 2016-04-13 MED ORDER — OXYCODONE-ACETAMINOPHEN 5-325 MG PO TABS: 1.0000 | ORAL_TABLET | ORAL | 0 refills | Status: DC | PRN

## 2016-04-13 MED ORDER — SODIUM CHLORIDE 0.9% FLUSH
10.0000 mL | INTRAVENOUS | Status: DC | PRN
  Administered 2016-04-13: 10 mL
  Filled 2016-04-13: qty 10

## 2016-04-13 MED ORDER — PROCHLORPERAZINE MALEATE 10 MG PO TABS
ORAL_TABLET | ORAL | Status: AC
  Filled 2016-04-13: qty 1

## 2016-04-13 NOTE — Patient Instructions (Signed)
[facility] Discharge Instructions for Patients Receiving Chemotherapy  Today you received the following chemotherapy agents:  Navelbine  To help prevent nausea and vomiting after your treatment, we encourage you to take your nausea medication.   If you develop nausea and vomiting that is not controlled by your nausea medication, call the clinic.   BELOW ARE SYMPTOMS THAT SHOULD BE REPORTED IMMEDIATELY:  *FEVER GREATER THAN 100.5 F  *CHILLS WITH OR WITHOUT FEVER  NAUSEA AND VOMITING THAT IS NOT CONTROLLED WITH YOUR NAUSEA MEDICATION  *UNUSUAL SHORTNESS OF BREATH  *UNUSUAL BRUISING OR BLEEDING  TENDERNESS IN MOUTH AND THROAT WITH OR WITHOUT PRESENCE OF ULCERS  *URINARY PROBLEMS  *BOWEL PROBLEMS  UNUSUAL RASH Items with * indicate a potential emergency and should be followed up as soon as possible.  Feel free to call the clinic you have any questions or concerns. The clinic phone number is [phone].  Please show the [facility] at check-in to the Emergency Department and triage nurse.

## 2016-04-13 NOTE — Progress Notes (Signed)
[place] [phone]   [phone]  OFFICE PROGRESS NOTE  [name], MD [place] [address]  DIAGNOSIS: Stage IV (T2a, N3, M1b) non-small cell lung cancer, squamous cell carcinoma presented with left lower lobe lung mass in April 2016.  PRIOR THERAPY: 1) Systemic chemotherapy with carboplatin for AUC of 5 given on day 1 and gemcitabine 1000 MG/M2 on days 1 and 8 every 3 weeks. Status post 6 cycles with partial response. 2) Nivolumab 240 MG IV every 2 weeks status post 4 cycles, last dose was given 03/18/2015 discontinued today secondary to disease progression. 3) systemic chemotherapy with docetaxel 75 MG/M2 and Cyramza 10 MG/KG every 3 weeks with Neulasta support status post 14 cycles. First dose was given 04/08/2015. Last dose was given 02/17/2016 discontinued secondary to disease progression.  CURRENT THERAPY: Navelbine 25 MG/M2 every 2 weeks, first dose 03/16/2016. Status post 2 cycles.  INTERVAL HISTORY: Keith Holloway 56 y.o. male came to the clinic today for follow-up visit accompanied by his wife. The patient is currently undergoing treatment with single agent Navelbine every 2 weeks status post 2 cycles. He tolerated the second cycle of his treatment well with no significant adverse effects. He continues to have pain on the right side of the chest with mild shortness breath with exertion as well as mild cough with no hemoptysis. He denied having any fever or chills. He has no nausea, vomiting, diarrhea or constipation. He is here today for evaluation before starting cycle #3.   MEDICAL HISTORY: Past Medical History:  Diagnosis Date  . Acute hyperglycemia 11/26/2015  . Alcohol abuse   . Arthritis   . Borderline hypertension   . CHF (congestive heart failure) ([place])   . CKD (chronic kidney disease), stage III   . Cocaine abuse    last [employer] postivie for cocaine 09-30-2015  . Depression   . Dyspnea on effort   .  Elevated blood sugar 11/26/2015  . Goals of care, counseling/discussion 03/09/2016  . History of acute renal failure    09-30-2015  . History of hyperkalemia    admission 09-30-2015 potassiom 8.0  with medications currently 10-14-2015 down to 4.7 normal  . History of traumatic head injury    1987  w/ LOC   --- no residual  . Hyperglycemia 02/17/2016  . Hyperlipidemia   . Hypertension 12/16/2015  . 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 Memory disorder 08/22/2013   alcohol abuse  . Migraine   . Nocturia   . Non-small cell carcinoma of lung, stage 4 [facility]) oncologist-  dr Julien Nordmann   w/ METs to liver dx 04/ 2016--  Stage IV (T2a, N3, M1b), squamous cell carcinoma of left lower lobe---  systemic chemotherapy every 3 weeks and currently due to evidence for disease progression has started immunotherapy  . Pilonidal abscess    recurrent  . Pleural effusion, bilateral    per ct 10-09-2015  new small right effusion and left small to moderate effusion , increased  . Polyneuropathy in diabetes(357.2)   . Port-a-cath in place   . Pulmonary nodules/lesions, multiple    pt has stage 4 lung cancer  . Recurrent left pleural effusion 01/06/2016  . Tobacco abuse counseling 10/09/2014  . Type 2 diabetes mellitus with insulin therapy ([place])   . Wears dentures    upper    ALLERGIES:  has No Known Allergies.  MEDICATIONS:  Current  Outpatient Prescriptions  Medication Sig Dispense Refill  . acetaminophen (TYLENOL) 325 MG tablet Take 2 tablets (650 mg total) by mouth every 6 (six) hours as needed for mild pain (or Fever >/= 101). 30 tablet 0  . amLODipine (NORVASC) 10 MG tablet Take 1 tablet (10 mg total) by mouth daily. 30 tablet 1  . aspirin EC 81 MG tablet Take 1 tablet by mouth every morning.     . benzonatate (TESSALON) 100 MG capsule Take 1 capsule (100 mg total) by mouth 3 (three) times daily as needed for cough. 20 capsule 0  . Cholecalciferol  (VITAMIN D-3) 1000 units CAPS Take 1,000 Units by mouth daily.    Marland Kitchen dexamethasone (DECADRON) 4 MG tablet 2 TABLETS BY MOUTH TWICE A DAY THE DAY BEFORE, DAY OF AND DAY AFTER THE CHEMOTHERAPY EVERY 3 WEEKS 40 tablet 1  . docusate sodium (COLACE) 100 MG capsule Take 1 capsule (100 mg total) by mouth 2 (two) times daily. 40 capsule 0  . Ferrous Sulfate (IRON) 325 (65 Fe) MG TABS Take 1 tablet by mouth 2 (two) times daily.     Marland Kitchen HYDROcodone-acetaminophen (NORCO/VICODIN) 5-325 MG tablet Take 2 tablets by mouth every 6 (six) hours as needed. 30 tablet 0  . ibuprofen (ADVIL,MOTRIN) 200 MG tablet Take 400 mg by mouth every 6 (six) hours as needed for moderate pain.    Marland Kitchen insulin NPH-regular Human (NOVOLIN 70/30) (70-30) 100 UNIT/ML injection Inject 35 Units into the skin 2 (two) times daily with a meal. (Patient taking differently: Inject 20 Units into the skin 2 (two) times daily with a meal. ONLY IF CBG IS OVER 200.) 10 mL 11  . lidocaine-prilocaine (EMLA) cream APPLY 1 APPLICATION TOPICALLY AS NEEDED. 30 g 0  . ondansetron (ZOFRAN) 8 MG tablet TAKE 1 TABLET EVERY 8 HOURS AS NEEDED FOR NAUSEA AND VOMITING (Patient not taking: Reported on 03/30/2016) 20 tablet 1  . oxyCODONE-acetaminophen (PERCOCET/ROXICET) 5-325 MG tablet Take 1 tablet by mouth every 4 (four) hours as needed for severe pain. 60 tablet 0  . prochlorperazine (COMPAZINE) 10 MG tablet TAKE 1 TABLET (10 MG TOTAL) BY MOUTH EVERY 6 (SIX) HOURS AS NEEDED FOR NAUSEA OR VOMITING. (Patient not taking: Reported on 03/16/2016) 30 tablet 0  . simvastatin (ZOCOR) 40 MG tablet Take 1 tablet (40 mg total) by mouth daily. (Patient taking differently: Take 40 mg by mouth every morning. ) 30 tablet 3  . temazepam (RESTORIL) 30 MG capsule TAKE ONE CAPSULE AT BEDTIME (Patient not taking: Reported on 03/16/2016) 30 capsule 0  . vitamin C (ASCORBIC ACID) 500 MG tablet Take 500 mg by mouth 2 (two) times daily.      No current facility-administered medications for this  visit.     SURGICAL HISTORY:  Past Surgical History:  Procedure Laterality Date  . CATARACT EXTRACTION, BILATERAL  08/2012  . PILONIDAL CYST EXCISION N/A 10/17/2015   Procedure: EXCISION PILONIDAL DISEASE;  Surgeon: Leighton Ruff, MD;  Location[facility];  Service: General;  Laterality: N/A;  . PORTACATH PLACEMENT  01/30/2015  . RETINAL DETACHMENT SURGERY Bilateral 04/2012    REVIEW OF SYSTEMS:  A comprehensive review of systems was negative except for: Constitutional: positive for fatigue Respiratory: positive for cough, dyspnea on exertion and pleurisy/chest pain Musculoskeletal: positive for arthralgias   PHYSICAL EXAMINATION: General appearance: alert, cooperative, fatigued and no distress Head: Normocephalic, without obvious abnormality, atraumatic Neck: no adenopathy, no JVD, supple, symmetrical, trachea midline and thyroid not enlarged, symmetric, no tenderness/mass/nodules Lymph  nodes: Cervical, supraclavicular, and axillary nodes normal. Resp: wheezes bilaterally Back: symmetric, no curvature. ROM normal. No CVA tenderness. Cardio: regular rate and rhythm, S1, S2 normal, no murmur, click, rub or gallop GI: soft, non-tender; bowel sounds normal; no masses,  no organomegaly Extremities: extremities normal, atraumatic, no cyanosis or edema  ECOG PERFORMANCE STATUS: 1 - Symptomatic but completely ambulatory  Blood pressure 129/80, pulse (!) 106, temperature 98.5 F (36.9 C), temperature source Oral, resp. rate 17, height '5\' 11"'$  (1.803 m), weight 175 lb 8 oz (79.6 kg), SpO2 96 %.  LABORATORY DATA: Lab Results  Component Value Date   WBC 10.3 04/13/2016   HGB 9.3 (L) 04/13/2016   HCT 28.7 (L) 04/13/2016   MCV 65.0 (L) 04/13/2016   PLT 374 04/13/2016      Chemistry      Component Value Date/Time   NA 140 03/30/2016 0844   K 5.0 03/30/2016 0844   CL 111 11/19/2015 0448   CO2 25 03/30/2016 0844   BUN 20.3 03/30/2016 0844   CREATININE 1.5 (H)  03/30/2016 0844   GLU 396 (H) 10/28/2015 1249      Component Value Date/Time   CALCIUM 9.3 03/30/2016 0844   ALKPHOS 115 03/30/2016 0844   AST 19 03/30/2016 0844   ALT 17 03/30/2016 0844   BILITOT 0.24 03/30/2016 0844       RADIOGRAPHIC STUDIES: No results found.  ASSESSMENT AND PLAN:  This is a very pleasant 56 years old African-American male with metastatic non-small cell lung cancer, squamous cell carcinoma status post several chemotherapy regimens including carboplatin and gemcitabine, immunotherapy with Navelbine followed by docetaxel and Cyramza. He is currently undergoing treatment with single agent Navelbine status post 2 cycles. He is tolerating the treatment well with no significant adverse effects. I recommended for him to proceed with cycle #3 today as a scheduled. I will see him back for follow-up visit in 2 weeks for evaluation before starting cycle #4. For pain management, I gave him a refill of Percocet today. He was advised to call immediately if he has any concerning symptoms in the interval. The patient voices understanding of current disease status and treatment options and is in agreement with the current care plan.  All questions were answered. The patient knows to call the clinic with any problems, questions or concerns. We can certainly see the patient much sooner if necessary. I spent 10 minutes counseling the patient face to face. The total time spent in the appointment was 15 minutes.  Disclaimer: This note was dictated with voice recognition software. Similar sounding words can inadvertently be transcribed and may not be corrected upon review.

## 2016-04-20 ENCOUNTER — Ambulatory Visit: Payer: Medicare Other | Admitting: Internal Medicine

## 2016-04-20 ENCOUNTER — Ambulatory Visit: Payer: Medicare Other

## 2016-04-20 ENCOUNTER — Other Ambulatory Visit: Payer: Medicare Other

## 2016-04-22 ENCOUNTER — Ambulatory Visit: Payer: Medicare Other

## 2016-04-27 ENCOUNTER — Other Ambulatory Visit (HOSPITAL_BASED_OUTPATIENT_CLINIC_OR_DEPARTMENT_OTHER): Payer: Medicare Other

## 2016-04-27 ENCOUNTER — Ambulatory Visit (HOSPITAL_BASED_OUTPATIENT_CLINIC_OR_DEPARTMENT_OTHER): Payer: Medicare Other

## 2016-04-27 ENCOUNTER — Ambulatory Visit (HOSPITAL_BASED_OUTPATIENT_CLINIC_OR_DEPARTMENT_OTHER): Payer: Medicare Other | Admitting: Internal Medicine

## 2016-04-27 ENCOUNTER — Encounter: Payer: Self-pay | Admitting: Internal Medicine

## 2016-04-27 ENCOUNTER — Telehealth: Payer: Self-pay | Admitting: Internal Medicine

## 2016-04-27 ENCOUNTER — Other Ambulatory Visit: Payer: Medicare Other

## 2016-04-27 VITALS — BP 126/73 | HR 102 | Temp 98.7°F | Resp 17 | Ht 71.0 in | Wt 171.8 lb

## 2016-04-27 DIAGNOSIS — C3432 Malignant neoplasm of lower lobe, left bronchus or lung: Secondary | ICD-10-CM

## 2016-04-27 DIAGNOSIS — R0602 Shortness of breath: Secondary | ICD-10-CM | POA: Diagnosis not present

## 2016-04-27 DIAGNOSIS — Z5111 Encounter for antineoplastic chemotherapy: Secondary | ICD-10-CM

## 2016-04-27 DIAGNOSIS — C787 Secondary malignant neoplasm of liver and intrahepatic bile duct: Secondary | ICD-10-CM

## 2016-04-27 DIAGNOSIS — C3492 Malignant neoplasm of unspecified part of left bronchus or lung: Secondary | ICD-10-CM

## 2016-04-27 DIAGNOSIS — I10 Essential (primary) hypertension: Secondary | ICD-10-CM

## 2016-04-27 LAB — COMPREHENSIVE METABOLIC PANEL
ALT: 10 U/L (ref 0–55)
AST: 14 U/L (ref 5–34)
Albumin: 3.3 g/dL — ABNORMAL LOW (ref 3.5–5.0)
Alkaline Phosphatase: 119 U/L (ref 40–150)
Anion Gap: 8 mEq/L (ref 3–11)
BUN: 21.2 mg/dL (ref 7.0–26.0)
CO2: 26 meq/L (ref 22–29)
Calcium: 9.6 mg/dL (ref 8.4–10.4)
Chloride: 105 mEq/L (ref 98–109)
Creatinine: 1.8 mg/dL — ABNORMAL HIGH (ref 0.7–1.3)
EGFR: 47 mL/min/{1.73_m2} — AB (ref >90)
Glucose: 221 mg/dl — ABNORMAL HIGH (ref 70–140)
Potassium: 4.8 mEq/L (ref 3.5–5.1)
Sodium: 139 mEq/L (ref 136–145)
TOTAL PROTEIN: 7.6 g/dL (ref 6.4–8.3)

## 2016-04-27 LAB — CBC WITH DIFFERENTIAL/PLATELET
BASO%: 0.3 % (ref 0.0–2.0)
BASOS ABS: 0 10*3/uL (ref 0.0–0.1)
EOS%: 1.1 % (ref 0.0–7.0)
Eosinophils Absolute: 0.1 10*3/uL (ref 0.0–0.5)
HCT: 28.8 % — ABNORMAL LOW (ref 38.4–49.9)
HGB: 9.2 g/dL — ABNORMAL LOW (ref 13.0–17.1)
LYMPH%: 12.7 % — ABNORMAL LOW (ref 14.0–49.0)
MCH: 20.3 pg — ABNORMAL LOW (ref 27.2–33.4)
MCHC: 31.9 g/dL — ABNORMAL LOW (ref 32.0–36.0)
MCV: 63.6 fL — AB (ref 79.3–98.0)
MONO#: 1.1 10*3/uL — ABNORMAL HIGH (ref 0.1–0.9)
MONO%: 10.3 % (ref 0.0–14.0)
NEUT%: 75.6 % — ABNORMAL HIGH (ref 39.0–75.0)
NEUTROS ABS: 8.1 10*3/uL — AB (ref 1.5–6.5)
NRBC: 0 % (ref 0–0)
Platelets: 355 10*3/uL (ref 140–400)
RBC: 4.53 10*6/uL (ref 4.20–5.82)
RDW: 20.2 % — AB (ref 11.0–14.6)
WBC: 10.7 10*3/uL — AB (ref 4.0–10.3)
lymph#: 1.4 10*3/uL (ref 0.9–3.3)

## 2016-04-27 MED ORDER — HEPARIN SOD (PORK) LOCK FLUSH 100 UNIT/ML IV SOLN
500.0000 [IU] | Freq: Once | INTRAVENOUS | Status: AC | PRN
  Administered 2016-04-27: 500 [IU]
  Filled 2016-04-27: qty 5

## 2016-04-27 MED ORDER — SODIUM CHLORIDE 0.9% FLUSH
10.0000 mL | INTRAVENOUS | Status: DC | PRN
  Administered 2016-04-27: 10 mL
  Filled 2016-04-27: qty 10

## 2016-04-27 MED ORDER — PROCHLORPERAZINE MALEATE 10 MG PO TABS
10.0000 mg | ORAL_TABLET | Freq: Once | ORAL | Status: AC
  Administered 2016-04-27: 10 mg via ORAL

## 2016-04-27 MED ORDER — METHYLPREDNISOLONE 4 MG PO TBPK: ORAL_TABLET | ORAL | 0 refills | Status: DC

## 2016-04-27 MED ORDER — SODIUM CHLORIDE 0.9 % IV SOLN
Freq: Once | INTRAVENOUS | Status: AC
  Administered 2016-04-27: 11:00:00 via INTRAVENOUS

## 2016-04-27 MED ORDER — VINORELBINE TARTRATE CHEMO INJECTION 50 MG/5ML
25.0000 mg/m2 | Freq: Once | INTRAVENOUS | Status: AC
  Administered 2016-04-27: 50 mg via INTRAVENOUS
  Filled 2016-04-27: qty 5

## 2016-04-27 MED ORDER — PROCHLORPERAZINE MALEATE 10 MG PO TABS
ORAL_TABLET | ORAL | Status: AC
  Filled 2016-04-27: qty 1

## 2016-04-27 NOTE — Telephone Encounter (Signed)
Lab, follow up and chemo scheduled per 04/27/16 los. Patient was given a copy of the AVS report an appointment schedule per 04/27/16 los.

## 2016-04-27 NOTE — Progress Notes (Signed)
OK to treat today with elevated creat per Dr Julien Nordmann. Order repeated & verified.

## 2016-04-27 NOTE — Patient Instructions (Signed)
[facility] Discharge Instructions for Patients Receiving Chemotherapy  Today you received the following chemotherapy agents:  Navelbine  To help prevent nausea and vomiting after your treatment, we encourage you to take your nausea medication.   If you develop nausea and vomiting that is not controlled by your nausea medication, call the clinic.   BELOW ARE SYMPTOMS THAT SHOULD BE REPORTED IMMEDIATELY:  *FEVER GREATER THAN 100.5 F  *CHILLS WITH OR WITHOUT FEVER  NAUSEA AND VOMITING THAT IS NOT CONTROLLED WITH YOUR NAUSEA MEDICATION  *UNUSUAL SHORTNESS OF BREATH  *UNUSUAL BRUISING OR BLEEDING  TENDERNESS IN MOUTH AND THROAT WITH OR WITHOUT PRESENCE OF ULCERS  *URINARY PROBLEMS  *BOWEL PROBLEMS  UNUSUAL RASH Items with * indicate a potential emergency and should be followed up as soon as possible.  Feel free to call the clinic you have any questions or concerns. The clinic phone number is [phone].  Please show the [place] at check-in to the Emergency Department and triage nurse.

## 2016-04-27 NOTE — Progress Notes (Signed)
[place] [phone]   [phone]  OFFICE PROGRESS NOTE  [name], MD [place] [address]  DIAGNOSIS: Stage IV (T2a, N3, M1b) non-small cell lung cancer, squamous cell carcinoma presented with left lower lobe lung mass in April 2016.  PRIOR THERAPY: 1) Systemic chemotherapy with carboplatin for AUC of 5 given on day 1 and gemcitabine 1000 MG/M2 on days 1 and 8 every 3 weeks. Status post 6 cycles with partial response. 2) Nivolumab 240 MG IV every 2 weeks status post 4 cycles, last dose was given 03/18/2015 discontinued today secondary to disease progression. 3) systemic chemotherapy with docetaxel 75 MG/M2 and Cyramza 10 MG/KG every 3 weeks with Neulasta support status post 14 cycles. First dose was given 04/08/2015. Last dose was given 02/17/2016 discontinued secondary to disease progression.  CURRENT THERAPY: Navelbine 25 MG/M2 every 2 weeks, first dose 03/16/2016. Status post 3 cycles.  INTERVAL HISTORY: Keith Holloway 56 y.o. male came to the clinic today for follow-up visit accompanied by his wife. The patient is feeling fine today with no specific complaints except for aching pain in the chest bilaterally as well as back. He is currently on Percocet but he use it more frequently than prescribed. He has shortness of breath with exertion and mild cough with no hemoptysis. He denied having any fever or chills. He has no nausea, vomiting, diarrhea or constipation. She also has lack of appetite and he lost around 5 pounds since his last visit. He is tolerating his current treatment with Navelbine fairly well. He is here today for evaluation before starting cycle #4 of his chemotherapy.   MEDICAL HISTORY: Past Medical History:  Diagnosis Date  . Acute hyperglycemia 11/26/2015  . Alcohol abuse   . Arthritis   . Borderline hypertension   . CHF (congestive heart failure) ([place])   . CKD (chronic kidney disease), stage III     . Cocaine abuse    last [employer] postivie for cocaine 09-30-2015  . Depression   . Dyspnea on effort   . Elevated blood sugar 11/26/2015  . Goals of care, counseling/discussion 03/09/2016  . History of acute renal failure    09-30-2015  . History of hyperkalemia    admission 09-30-2015 potassiom 8.0  with medications currently 10-14-2015 down to 4.7 normal  . History of traumatic head injury    1987  w/ LOC   --- no residual  . Hyperglycemia 02/17/2016  . Hyperlipidemia   . Hypertension 12/16/2015  . 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 Memory disorder 08/22/2013   alcohol abuse  . Migraine   . Nocturia   . Non-small cell carcinoma of lung, stage 4 [facility] oncologist-  dr Julien Nordmann   w/ METs to liver dx 04/ 2016--  Stage IV (T2a, N3, M1b), squamous cell carcinoma of left lower lobe---  systemic chemotherapy every 3 weeks and currently due to evidence for disease progression has started immunotherapy  . Pilonidal abscess    recurrent  . Pleural effusion, bilateral    per ct 10-09-2015  new small right effusion and left small to moderate effusion , increased  . Polyneuropathy in diabetes(357.2)   . Port-a-cath in place   . Pulmonary nodules/lesions, multiple    pt has stage 4 lung cancer  . Recurrent left pleural effusion 01/06/2016  . Tobacco abuse counseling 10/09/2014  . Type 2 diabetes mellitus with insulin therapy ([place])   .  Wears dentures    upper    ALLERGIES:  has No Known Allergies.  MEDICATIONS:  Current Outpatient Prescriptions  Medication Sig Dispense Refill  . acetaminophen (TYLENOL) 325 MG tablet Take 2 tablets (650 mg total) by mouth every 6 (six) hours as needed for mild pain (or Fever >/= 101). 30 tablet 0  . amLODipine (NORVASC) 10 MG tablet Take 1 tablet (10 mg total) by mouth daily. 30 tablet 1  . aspirin EC 81 MG tablet Take 1 tablet by mouth every morning.     . benzonatate (TESSALON) 100 MG capsule Take 1  capsule (100 mg total) by mouth 3 (three) times daily as needed for cough. 20 capsule 0  . Cholecalciferol (VITAMIN D-3) 1000 units CAPS Take 1,000 Units by mouth daily.    Marland Kitchen docusate sodium (COLACE) 100 MG capsule Take 1 capsule (100 mg total) by mouth 2 (two) times daily. 40 capsule 0  . Ferrous Sulfate (IRON) 325 (65 Fe) MG TABS Take 1 tablet by mouth 2 (two) times daily.     Marland Kitchen ibuprofen (ADVIL,MOTRIN) 200 MG tablet Take 400 mg by mouth every 6 (six) hours as needed for moderate pain.    Marland Kitchen insulin NPH-regular Human (NOVOLIN 70/30) (70-30) 100 UNIT/ML injection Inject 35 Units into the skin 2 (two) times daily with a meal. (Patient taking differently: Inject 20 Units into the skin 2 (two) times daily with a meal. ONLY IF CBG IS OVER 200.) 10 mL 11  . lidocaine-prilocaine (EMLA) cream APPLY 1 APPLICATION TOPICALLY AS NEEDED. 30 g 0  . ondansetron (ZOFRAN) 8 MG tablet TAKE 1 TABLET EVERY 8 HOURS AS NEEDED FOR NAUSEA AND VOMITING (Patient not taking: Reported on 03/30/2016) 20 tablet 1  . oxyCODONE-acetaminophen (PERCOCET/ROXICET) 5-325 MG tablet Take 1 tablet by mouth every 4 (four) hours as needed for severe pain. 60 tablet 0  . prochlorperazine (COMPAZINE) 10 MG tablet TAKE 1 TABLET (10 MG TOTAL) BY MOUTH EVERY 6 (SIX) HOURS AS NEEDED FOR NAUSEA OR VOMITING. (Patient not taking: Reported on 03/16/2016) 30 tablet 0  . simvastatin (ZOCOR) 40 MG tablet Take 1 tablet (40 mg total) by mouth daily. (Patient taking differently: Take 40 mg by mouth every morning. ) 30 tablet 3  . temazepam (RESTORIL) 30 MG capsule TAKE ONE CAPSULE AT BEDTIME (Patient not taking: Reported on 03/16/2016) 30 capsule 0  . vitamin C (ASCORBIC ACID) 500 MG tablet Take 500 mg by mouth 2 (two) times daily.      No current facility-administered medications for this visit.     SURGICAL HISTORY:  Past Surgical History:  Procedure Laterality Date  . CATARACT EXTRACTION, BILATERAL  08/2012  . PILONIDAL CYST EXCISION N/A 10/17/2015    Procedure: EXCISION PILONIDAL DISEASE;  Surgeon: Leighton Ruff, MD;  [facility];  Service: General;  Laterality: N/A;  . PORTACATH PLACEMENT  01/30/2015  . RETINAL DETACHMENT SURGERY Bilateral 04/2012    REVIEW OF SYSTEMS:  A comprehensive review of systems was negative except for: Constitutional: positive for fatigue and weight loss Respiratory: positive for cough, dyspnea on exertion and pleurisy/chest pain Musculoskeletal: positive for arthralgias   PHYSICAL EXAMINATION: General appearance: alert, cooperative, fatigued and no distress Head: Normocephalic, without obvious abnormality, atraumatic Neck: no adenopathy, no JVD, supple, symmetrical, trachea midline and thyroid not enlarged, symmetric, no tenderness/mass/nodules Lymph nodes: Cervical, supraclavicular, and axillary nodes normal. Resp: wheezes bilaterally Back: symmetric, no curvature. ROM normal. No CVA tenderness. Cardio: regular rate and rhythm, S1, S2 normal, no murmur,  click, rub or gallop GI: soft, non-tender; bowel sounds normal; no masses,  no organomegaly Extremities: extremities normal, atraumatic, no cyanosis or edema  ECOG PERFORMANCE STATUS: 1 - Symptomatic but completely ambulatory  Blood pressure 126/73, pulse (!) 102, temperature 98.7 F (37.1 C), temperature source Oral, resp. rate 17, height '5\' 11"'$  (1.803 m), weight 171 lb 12.8 oz (77.9 kg), SpO2 97 %.  LABORATORY DATA: Lab Results  Component Value Date   WBC 10.7 (H) 04/27/2016   HGB 9.2 (L) 04/27/2016   HCT 28.8 (L) 04/27/2016   MCV 63.6 (L) 04/27/2016   PLT 355 04/27/2016      Chemistry      Component Value Date/Time   NA 141 04/13/2016 0831   K 4.6 04/13/2016 0831   CL 111 11/19/2015 0448   CO2 25 04/13/2016 0831   BUN 26.6 (H) 04/13/2016 0831   CREATININE 1.5 (H) 04/13/2016 0831   GLU 396 (H) 10/28/2015 1249      Component Value Date/Time   CALCIUM 9.7 04/13/2016 0831   ALKPHOS 144 04/13/2016 0831   AST 15  04/13/2016 0831   ALT 15 04/13/2016 0831   BILITOT <0.22 04/13/2016 0831       RADIOGRAPHIC STUDIES: No results found.  ASSESSMENT AND PLAN:  This is a very pleasant 56 years old African-American male with metastatic non-small cell lung cancer, squamous cell carcinoma. The patient was treated with several chemotherapy regimens in the past. He is currently undergoing systemic chemotherapy with Navelbine every 2 weeks status post 3 cycles. I recommended for the patient to proceed with cycle #4 today as scheduled. I will see him back for follow-up visit in 2 weeks for evaluation after repeating CT scan of the chest, abdomen and pelvis without contrast for restaging of his disease. For the lack of appetite, I will start the patient on Medrol Dosepak and he was advised to monitor his blood sugar closely during that time. For pain management she will continue on Percocet and if needed ibuprofen. He was advised to call immediately if he has any concerning symptoms in the interval. The patient voices understanding of current disease status and treatment options and is in agreement with the current care plan.  All questions were answered. The patient knows to call the clinic with any problems, questions or concerns. We can certainly see the patient much sooner if necessary. I spent 10 minutes counseling the patient face to face. The total time spent in the appointment was 15 minutes.  Disclaimer: This note was dictated with voice recognition software. Similar sounding words can inadvertently be transcribed and may not be corrected upon review.

## 2016-05-04 ENCOUNTER — Other Ambulatory Visit: Payer: Medicare Other

## 2016-05-06 ENCOUNTER — Ambulatory Visit (HOSPITAL_COMMUNITY)
Admission: RE | Admit: 2016-05-06 | Discharge: 2016-05-06 | Disposition: A | Discharge status: Home / Self Care | Payer: Medicare Other | Source: Ambulatory Visit | Attending: Internal Medicine | Admitting: Internal Medicine

## 2016-05-06 DIAGNOSIS — I10 Essential (primary) hypertension: Secondary | ICD-10-CM

## 2016-05-06 DIAGNOSIS — J9811 Atelectasis: Secondary | ICD-10-CM | POA: Diagnosis not present

## 2016-05-06 DIAGNOSIS — N400 Enlarged prostate without lower urinary tract symptoms: Secondary | ICD-10-CM | POA: Insufficient documentation

## 2016-05-06 DIAGNOSIS — C787 Secondary malignant neoplasm of liver and intrahepatic bile duct: Secondary | ICD-10-CM | POA: Diagnosis not present

## 2016-05-06 DIAGNOSIS — I700 Atherosclerosis of aorta: Secondary | ICD-10-CM | POA: Insufficient documentation

## 2016-05-06 DIAGNOSIS — C3492 Malignant neoplasm of unspecified part of left bronchus or lung: Secondary | ICD-10-CM | POA: Diagnosis present

## 2016-05-06 DIAGNOSIS — Z5111 Encounter for antineoplastic chemotherapy: Secondary | ICD-10-CM

## 2016-05-06 DIAGNOSIS — J90 Pleural effusion, not elsewhere classified: Secondary | ICD-10-CM | POA: Insufficient documentation

## 2016-05-11 ENCOUNTER — Ambulatory Visit (HOSPITAL_BASED_OUTPATIENT_CLINIC_OR_DEPARTMENT_OTHER): Payer: Medicare Other | Admitting: Internal Medicine

## 2016-05-11 ENCOUNTER — Ambulatory Visit: Payer: Medicare Other

## 2016-05-11 ENCOUNTER — Encounter: Payer: Self-pay | Admitting: Internal Medicine

## 2016-05-11 ENCOUNTER — Other Ambulatory Visit: Payer: Medicare Other

## 2016-05-11 ENCOUNTER — Other Ambulatory Visit (HOSPITAL_BASED_OUTPATIENT_CLINIC_OR_DEPARTMENT_OTHER): Payer: Medicare Other

## 2016-05-11 ENCOUNTER — Ambulatory Visit: Payer: Medicare Other | Admitting: Internal Medicine

## 2016-05-11 DIAGNOSIS — C3492 Malignant neoplasm of unspecified part of left bronchus or lung: Secondary | ICD-10-CM

## 2016-05-11 DIAGNOSIS — Z5111 Encounter for antineoplastic chemotherapy: Secondary | ICD-10-CM

## 2016-05-11 DIAGNOSIS — C3432 Malignant neoplasm of lower lobe, left bronchus or lung: Secondary | ICD-10-CM

## 2016-05-11 DIAGNOSIS — N183 Chronic kidney disease, stage 3 (moderate): Secondary | ICD-10-CM | POA: Diagnosis not present

## 2016-05-11 DIAGNOSIS — E119 Type 2 diabetes mellitus without complications: Secondary | ICD-10-CM

## 2016-05-11 DIAGNOSIS — E860 Dehydration: Secondary | ICD-10-CM

## 2016-05-11 DIAGNOSIS — N182 Chronic kidney disease, stage 2 (mild): Secondary | ICD-10-CM

## 2016-05-11 DIAGNOSIS — I10 Essential (primary) hypertension: Secondary | ICD-10-CM

## 2016-05-11 DIAGNOSIS — F5102 Adjustment insomnia: Secondary | ICD-10-CM

## 2016-05-11 LAB — CBC WITH DIFFERENTIAL/PLATELET
BASO%: 0.9 % (ref 0.0–2.0)
Basophils Absolute: 0.1 10*3/uL (ref 0.0–0.1)
EOS%: 1.1 % (ref 0.0–7.0)
Eosinophils Absolute: 0.1 10*3/uL (ref 0.0–0.5)
HCT: 27.7 % — ABNORMAL LOW (ref 38.4–49.9)
HGB: 9 g/dL — ABNORMAL LOW (ref 13.0–17.1)
LYMPH#: 1.2 10*3/uL (ref 0.9–3.3)
LYMPH%: 12.9 % — AB (ref 14.0–49.0)
MCH: 20.6 pg — AB (ref 27.2–33.4)
MCHC: 32.6 g/dL (ref 32.0–36.0)
MCV: 63.2 fL — ABNORMAL LOW (ref 79.3–98.0)
MONO#: 1.4 10*3/uL — ABNORMAL HIGH (ref 0.1–0.9)
MONO%: 14.8 % — AB (ref 0.0–14.0)
NEUT#: 6.5 10*3/uL (ref 1.5–6.5)
NEUT%: 70.3 % (ref 39.0–75.0)
Platelets: 338 10*3/uL (ref 140–400)
RBC: 4.39 10*6/uL (ref 4.20–5.82)
RDW: 21 % — ABNORMAL HIGH (ref 11.0–14.6)
WBC: 9.3 10*3/uL (ref 4.0–10.3)

## 2016-05-11 LAB — TECHNOLOGIST REVIEW

## 2016-05-11 LAB — COMPREHENSIVE METABOLIC PANEL
ALT: 16 U/L (ref 0–55)
AST: 14 U/L (ref 5–34)
Albumin: 3.3 g/dL — ABNORMAL LOW (ref 3.5–5.0)
Alkaline Phosphatase: 132 U/L (ref 40–150)
Anion Gap: 9 mEq/L (ref 3–11)
BUN: 28.8 mg/dL — ABNORMAL HIGH (ref 7.0–26.0)
CHLORIDE: 101 meq/L (ref 98–109)
CO2: 26 meq/L (ref 22–29)
CREATININE: 1.9 mg/dL — AB (ref 0.7–1.3)
Calcium: 9.9 mg/dL (ref 8.4–10.4)
EGFR: 45 mL/min/{1.73_m2} — ABNORMAL LOW (ref >90)
Glucose: 156 mg/dl — ABNORMAL HIGH (ref 70–140)
POTASSIUM: 5.2 meq/L — AB (ref 3.5–5.1)
Sodium: 137 mEq/L (ref 136–145)
Total Bilirubin: <0.22 mg/dL (ref 0.20–1.20)
Total Protein: 7.7 g/dL (ref 6.4–8.3)

## 2016-05-11 MED ORDER — OXYCODONE-ACETAMINOPHEN 5-325 MG PO TABS: 1.0000 | ORAL_TABLET | ORAL | 0 refills | Status: DC | PRN

## 2016-05-11 NOTE — Progress Notes (Signed)
[place] [phone]) [phone]   [phone]  OFFICE PROGRESS NOTE  [name], MD [place] [address]  DIAGNOSIS: Stage IV (T2a, N3, M1b) non-small cell lung cancer, squamous cell carcinoma presented with left lower lobe lung mass in April 2016.  PRIOR THERAPY: 1) Systemic chemotherapy with carboplatin for AUC of 5 given on day 1 and gemcitabine 1000 MG/M2 on days 1 and 8 every 3 weeks. Status post 6 cycles with partial response. 2) Nivolumab 240 MG IV every 2 weeks status post 4 cycles, last dose was given 03/18/2015 discontinued today secondary to disease progression. 3) systemic chemotherapy with docetaxel 75 MG/M2 and Cyramza 10 MG/KG every 3 weeks with Neulasta support status post 14 cycles. First dose was given 04/08/2015. Last dose was given 02/17/2016 discontinued secondary to disease progression. 4) chemotherapy was Navelbine 25 MG/M2 every 2 weeks is status post 4 cycles. Last dose was given 04/27/2016 discontinued secondary to disease progression.  CURRENT THERAPY: None.  INTERVAL HISTORY: Keith Holloway 56 y.o. male returns to the clinic today for follow-up visit accompanied by his wife. The patient continues to complain of pain in the left and right side of the chest. He is currently on Percocet every 6 hours but not controlling his pain. He would like to increase the frequency. He also has shortness of breath with exertion with mild cough and no hemoptysis. He denied having any fever or chills. He has no nausea, vomiting, diarrhea or constipation. He has no significant weight loss or night sweats. He tolerated the last cycle of his treatment with Navelbine fairly well. He had repeat CT scan of the chest, abdomen and pelvis performed recently and he is here for evaluation and discussion of his scan results.  MEDICAL HISTORY: Past Medical History:  Diagnosis Date  . Acute hyperglycemia 11/26/2015  . Alcohol abuse   .  Arthritis   . Borderline hypertension   . CHF (congestive heart failure) ([place])   . CKD (chronic kidney disease), stage III   . Cocaine abuse    last [employer] post[employer] for cocaine 09-30-2015  . Depression   . Dyspnea on effort   . Elevated blood sugar 11/26/2015  . Goals of care, counseling/discussion 03/09/2016  . History of acute renal failure    09-30-2015  . History of hyperkalemia    admission 09-30-2015 potassiom 8.0  with medications currently 10-14-2015 down to 4.7 normal  . History of traumatic head injury    1987  w/ LOC   --- no residual  . Hyperglycemia 02/17/2016  . Hyperlipidemia   . Hypertension 12/16/2015  . 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 Memory disorder 08/22/2013   alcohol abuse  . Migraine   . Nocturia   . Non-small cell carcinoma of lung, stage 4 [facility]) oncologist-  dr Julien Nordmann   w/ METs to liver dx 04/ 2016--  Stage IV (T2a, N3, M1b), squamous cell carcinoma of left lower lobe---  systemic chemotherapy every 3 weeks and currently due to evidence for disease progression has started immunotherapy  . Pilonidal abscess    recurrent  . Pleural effusion, bilateral    per ct 10-09-2015  new small right effusion and left small to moderate effusion , increased  . Polyneuropathy in diabetes(357.2)   . Port-a-cath in place   . Pulmonary nodules/lesions, multiple    pt has stage 4 lung cancer  . Recurrent left pleural  effusion 01/06/2016  . Tobacco abuse counseling 10/09/2014  . Type 2 diabetes mellitus with insulin therapy ([place])   . Wears dentures    upper    ALLERGIES:  has No Known Allergies.  MEDICATIONS:  Current Outpatient Prescriptions  Medication Sig Dispense Refill  . acetaminophen (TYLENOL) 325 MG tablet Take 2 tablets (650 mg total) by mouth every 6 (six) hours as needed for mild pain (or Fever >/= 101). 30 tablet 0  . amLODipine (NORVASC) 10 MG tablet Take 1 tablet (10 mg total) by mouth daily. 30 tablet  1  . aspirin EC 81 MG tablet Take 1 tablet by mouth every morning.     . benzonatate (TESSALON) 100 MG capsule Take 1 capsule (100 mg total) by mouth 3 (three) times daily as needed for cough. 20 capsule 0  . Cholecalciferol (VITAMIN D-3) 1000 units CAPS Take 1,000 Units by mouth daily.    Marland Kitchen docusate sodium (COLACE) 100 MG capsule Take 1 capsule (100 mg total) by mouth 2 (two) times daily. 40 capsule 0  . Ferrous Sulfate (IRON) 325 (65 Fe) MG TABS Take 1 tablet by mouth 2 (two) times daily.     Marland Kitchen ibuprofen (ADVIL,MOTRIN) 200 MG tablet Take 400 mg by mouth every 6 (six) hours as needed for moderate pain.    Marland Kitchen insulin NPH-regular Human (NOVOLIN 70/30) (70-30) 100 UNIT/ML injection Inject 35 Units into the skin 2 (two) times daily with a meal. (Patient taking differently: Inject 20 Units into the skin 2 (two) times daily with a meal. ONLY IF CBG IS OVER 200.) 10 mL 11  . lidocaine-prilocaine (EMLA) cream APPLY 1 APPLICATION TOPICALLY AS NEEDED. 30 g 0  . ondansetron (ZOFRAN) 8 MG tablet TAKE 1 TABLET EVERY 8 HOURS AS NEEDED FOR NAUSEA AND VOMITING 20 tablet 1  . oxyCODONE-acetaminophen (PERCOCET/ROXICET) 5-325 MG tablet Take 1 tablet by mouth every 4 (four) hours as needed for severe pain. 60 tablet 0  . simvastatin (ZOCOR) 40 MG tablet Take 1 tablet (40 mg total) by mouth daily. (Patient taking differently: Take 40 mg by mouth every morning. ) 30 tablet 3  . vitamin C (ASCORBIC ACID) 500 MG tablet Take 500 mg by mouth 2 (two) times daily.     . prochlorperazine (COMPAZINE) 10 MG tablet TAKE 1 TABLET (10 MG TOTAL) BY MOUTH EVERY 6 (SIX) HOURS AS NEEDED FOR NAUSEA OR VOMITING. (Patient not taking: Reported on 03/16/2016) 30 tablet 0  . temazepam (RESTORIL) 30 MG capsule TAKE ONE CAPSULE AT BEDTIME (Patient not taking: Reported on 03/16/2016) 30 capsule 0   No current facility-administered medications for this visit.     SURGICAL HISTORY:  Past Surgical History:  Procedure Laterality Date  . CATARACT  EXTRACTION, BILATERAL  08/2012  . PILONIDAL CYST EXCISION N/A 10/17/2015   Procedure: EXCISION PILONIDAL DISEASE;  Surgeon: Leighton Ruff, MD;  Location: [facility];  Service: General;  Laterality: N/A;  . PORTACATH PLACEMENT  01/30/2015  . RETINAL DETACHMENT SURGERY Bilateral 04/2012    REVIEW OF SYSTEMS:  Constitutional: positive for fatigue and weight loss Eyes: negative Ears, nose, mouth, throat, and face: negative Respiratory: positive for cough, dyspnea on exertion and pleurisy/chest pain Cardiovascular: negative Gastrointestinal: negative Genitourinary:negative Integument/breast: negative Hematologic/lymphatic: negative Musculoskeletal:negative Neurological: negative Behavioral/Psych: negative Endocrine: negative Allergic/Immunologic: negative   PHYSICAL EXAMINATION: General appearance: alert, cooperative, fatigued and no distress Head: Normocephalic, without obvious abnormality, atraumatic Neck: no adenopathy, no JVD, supple, symmetrical, trachea midline and thyroid not enlarged, symmetric, no tenderness/mass/nodules Lymph  nodes: Cervical, supraclavicular, and axillary nodes normal. Resp: wheezes bilaterally Back: symmetric, no curvature. ROM normal. No CVA tenderness. Cardio: regular rate and rhythm, S1, S2 normal, no murmur, click, rub or gallop GI: soft, non-tender; bowel sounds normal; no masses,  no organomegaly Extremities: extremities normal, atraumatic, no cyanosis or edema Neurologic: Alert and oriented X 3, normal strength and tone. Normal symmetric reflexes. Normal coordination and gait  ECOG PERFORMANCE STATUS: 1 - Symptomatic but completely ambulatory  Blood pressure 108/67, pulse 85, temperature 97.8 F (36.6 C), temperature source Oral, resp. rate 18, height '5\' 11"'$  (1.803 m), weight 168 lb 1.6 oz (76.2 kg), SpO2 98 %.  LABORATORY DATA: Lab Results  Component Value Date   WBC 9.3 05/11/2016   HGB 9.0 (L) 05/11/2016   HCT 27.7 (L)  05/11/2016   MCV 63.2 (L) 05/11/2016   PLT 338 05/11/2016      Chemistry      Component Value Date/Time   NA 139 04/27/2016 0857   K 4.8 04/27/2016 0857   CL 111 11/19/2015 0448   CO2 26 04/27/2016 0857   BUN 21.2 04/27/2016 0857   CREATININE 1.8 (H) 04/27/2016 0857   GLU 396 (H) 10/28/2015 1249      Component Value Date/Time   CALCIUM 9.6 04/27/2016 0857   ALKPHOS 119 04/27/2016 0857   AST 14 04/27/2016 0857   ALT 10 04/27/2016 0857   BILITOT <0.22 04/27/2016 0857       RADIOGRAPHIC STUDIES: Ct Abdomen Pelvis Wo Contrast  Result Date: 05/06/2016 CLINICAL DATA:  Restaging left lung cancer. EXAM: CT CHEST, ABDOMEN AND PELVIS WITHOUT CONTRAST TECHNIQUE: Multidetector CT imaging of the chest, abdomen and pelvis was performed following the standard protocol without IV contrast. COMPARISON:  03/03/2016 FINDINGS: CT CHEST FINDINGS Chest wall: No chest wall mass, supraclavicular or axillary lymphadenopathy. Small scattered lymph nodes are stable. Right-sided Port-A-Cath is stable. Cardiovascular: The heart is normal in size and stable. Stable pericardial effusion. The aorta is normal in caliber. No significant atherosclerotic calcifications. Mediastinum/Nodes: Stable large anterior mediastinal mass measuring approximately 7.6 x 4.3 cm. Enlarging subcarinal nodal mass measuring 4 x 1.8 cm on image number 31. This previously measured 3.1 x 1.2 cm. The esophagus is grossly normal. Lungs/Pleura: Interval decrease in size of the left pleural effusion. The left apical lung mass has increased in size measuring 3.0 x 2.0 cm on image number 22. This previously measured 1.8 x 0.7 cm. 7.5 mm nodule in the right upper lobe on image number 30 previously measured 4.5 mm. 6 mm left upper lobe nodule on image number 36 previously measured 4 mm. 12.5 mm right upper lobe nodule on image 65 previously measured 6 mm. 2 cm right upper lobe lesion on image 75 previously measured 1.4 cm. Numerous small pleural  nodules. New line persistent dense left lower lobe atelectasis and peribronchial thickening. Musculoskeletal: Mixed lytic and sclerotic process involving the sternum appears stable and is likely post radiation change. No thoracic spinal lesions. CT ABDOMEN PELVIS FINDINGS Hepatobiliary: Enlarging/progressive hepatic metastatic disease. Segment 3 lesion on image number 55 measures 4.5 x 3.8 cm and previously measured 2.9 x 2.3 cm. Segment 8 lesion on image number 50 measures 2.7 x 2.2 cm and previously measured 2.0 x 2.0 cm. New lesion at the hepatic dome in segment 8 measures 2.2 x 2.4 cm on image number 43. There is also a smaller lesion more posteriorly which may have been there on the prior study but was much smaller. Multiple other hepatic lesions  are identified in both lobes. New line no intrahepatic biliary dilatation. The gallbladder is grossly normal. No common bile duct dilatation. Pancreas: No mass, inflammation or ductal dilatation. Spleen: Normal size.  No focal lesions. Adrenals/Urinary Tract: The adrenal glands and kidneys are grossly normal in stable. Stomach/Bowel: The stomach, duodenum, small bowel and colon are grossly normal. There is a large amount of air in stool throughout the colon with suggests constipation. Vascular/Lymphatic: Stable aortic calcifications. No mesenteric or retroperitoneal lymphadenopathy. Reproductive: The prostate gland is mildly enlarged. Suspect TURP defect. The seminal vesicles appear normal. Other: No pelvic mass or adenopathy. No free pelvic fluid collections. No inguinal mass or adenopathy. No abdominal wall hernia or subcutaneous lesions. Musculoskeletal: No significant bony findings. IMPRESSION: 1. The large anterior mediastinal mass is relatively stable in size. 2. The lung lesions have enlarged since the prior CT scan as detailed above. 3. Interval decrease in size of the left pleural effusion. Persistent left lower lobe atelectasis but overall lungs are much  better aerated on the prior study. 4. Progressive hepatic metastatic disease. 5. Stable probable radiation changes involving the sternum. No obvious findings for osseous metastatic disease. Electronically Signed   By: Marijo Sanes M.D.   On: 05/06/2016 10:18   Ct Chest Wo Contrast  Result Date: 05/06/2016 CLINICAL DATA:  Restaging left lung cancer. EXAM: CT CHEST, ABDOMEN AND PELVIS WITHOUT CONTRAST TECHNIQUE: Multidetector CT imaging of the chest, abdomen and pelvis was performed following the standard protocol without IV contrast. COMPARISON:  03/03/2016 FINDINGS: CT CHEST FINDINGS Chest wall: No chest wall mass, supraclavicular or axillary lymphadenopathy. Small scattered lymph nodes are stable. Right-sided Port-A-Cath is stable. Cardiovascular: The heart is normal in size and stable. Stable pericardial effusion. The aorta is normal in caliber. No significant atherosclerotic calcifications. Mediastinum/Nodes: Stable large anterior mediastinal mass measuring approximately 7.6 x 4.3 cm. Enlarging subcarinal nodal mass measuring 4 x 1.8 cm on image number 31. This previously measured 3.1 x 1.2 cm. The esophagus is grossly normal. Lungs/Pleura: Interval decrease in size of the left pleural effusion. The left apical lung mass has increased in size measuring 3.0 x 2.0 cm on image number 22. This previously measured 1.8 x 0.7 cm. 7.5 mm nodule in the right upper lobe on image number 30 previously measured 4.5 mm. 6 mm left upper lobe nodule on image number 36 previously measured 4 mm. 12.5 mm right upper lobe nodule on image 65 previously measured 6 mm. 2 cm right upper lobe lesion on image 75 previously measured 1.4 cm. Numerous small pleural nodules. New line persistent dense left lower lobe atelectasis and peribronchial thickening. Musculoskeletal: Mixed lytic and sclerotic process involving the sternum appears stable and is likely post radiation change. No thoracic spinal lesions. CT ABDOMEN PELVIS FINDINGS  Hepatobiliary: Enlarging/progressive hepatic metastatic disease. Segment 3 lesion on image number 55 measures 4.5 x 3.8 cm and previously measured 2.9 x 2.3 cm. Segment 8 lesion on image number 50 measures 2.7 x 2.2 cm and previously measured 2.0 x 2.0 cm. New lesion at the hepatic dome in segment 8 measures 2.2 x 2.4 cm on image number 43. There is also a smaller lesion more posteriorly which may have been there on the prior study but was much smaller. Multiple other hepatic lesions are identified in both lobes. New line no intrahepatic biliary dilatation. The gallbladder is grossly normal. No common bile duct dilatation. Pancreas: No mass, inflammation or ductal dilatation. Spleen: Normal size.  No focal lesions. Adrenals/Urinary Tract: The adrenal glands  and kidneys are grossly normal in stable. Stomach/Bowel: The stomach, duodenum, small bowel and colon are grossly normal. There is a large amount of air in stool throughout the colon with suggests constipation. Vascular/Lymphatic: Stable aortic calcifications. No mesenteric or retroperitoneal lymphadenopathy. Reproductive: The prostate gland is mildly enlarged. Suspect TURP defect. The seminal vesicles appear normal. Other: No pelvic mass or adenopathy. No free pelvic fluid collections. No inguinal mass or adenopathy. No abdominal wall hernia or subcutaneous lesions. Musculoskeletal: No significant bony findings. IMPRESSION: 1. The large anterior mediastinal mass is relatively stable in size. 2. The lung lesions have enlarged since the prior CT scan as detailed above. 3. Interval decrease in size of the left pleural effusion. Persistent left lower lobe atelectasis but overall lungs are much better aerated on the prior study. 4. Progressive hepatic metastatic disease. 5. Stable probable radiation changes involving the sternum. No obvious findings for osseous metastatic disease. Electronically Signed   By: Marijo Sanes M.D.   On: 05/06/2016 10:18    ASSESSMENT  AND PLAN:  This is a very pleasant 56 years old African-American male with metastatic non-small cell lung cancer, squamous cell carcinoma status post several regimens of chemotherapy as well as immunotherapy in the past. He was most recently treated with single agent Navelbine status post 4 cycles. He had repeat CT scan of the chest, abdomen and pelvis performed recently. I personally and independently reviewed the scan and discuss the results with the patient and his wife. Unfortunately his scan showed clear evidence for disease progression. I had a lengthy discussion with the patient his wife about his current condition and treatment options. I also discussed with him the goals of care. I gave the patient the option of palliative care and hospice referral versus consideration of referring him to Dr. Durenda Hurt at [facility] for a second opinion. The patient is still interested in further treatment and he would like to go to [facility] for a second opinion. For pain management, I gave him a refill of Percocet 5/325 mg by mouth Q4 hours as needed for pain. For hypertension, he will continue to monitor his blood pressure closely and to take his blood pressure medication as prescribed. For the diabetes mellitus, he will continue his current treatment as prescribed by his primary care physician. I would see the patient on as-needed basis at this point. He was advised to call immediately if he has any concerning symptoms in the interval. The patient voices understanding of current disease status and treatment options and is in agreement with the current care plan.  All questions were answered. The patient knows to call the clinic with any problems, questions or concerns. We can certainly see the patient much sooner if necessary.  Disclaimer: This note was dictated with voice recognition software. Similar sounding words can inadvertently be transcribed and may not be corrected upon  review.

## 2016-05-13 ENCOUNTER — Telehealth: Payer: Self-pay | Admitting: Internal Medicine

## 2016-05-13 ENCOUNTER — Ambulatory Visit: Payer: Medicare Other

## 2016-05-13 NOTE — Telephone Encounter (Signed)
Faxed records to Dr.Stinchcomb's office, The Dr will review the records and [facility] from his office will call pt with appt.

## 2016-05-14 ENCOUNTER — Telehealth: Payer: Self-pay | Admitting: Internal Medicine

## 2016-05-14 NOTE — Telephone Encounter (Signed)
Patient aware. Cancelled all appts per 05/11/2016 los. And Referral sent to [facility] in [place] .

## 2016-05-25 ENCOUNTER — Other Ambulatory Visit: Payer: Medicare Other

## 2016-05-25 ENCOUNTER — Ambulatory Visit: Payer: Medicare Other

## 2016-05-25 ENCOUNTER — Telehealth: Payer: Self-pay | Admitting: Internal Medicine

## 2016-05-25 ENCOUNTER — Ambulatory Visit: Payer: Medicare Other | Admitting: Internal Medicine

## 2016-05-25 NOTE — Telephone Encounter (Signed)
Pt appt with Dr, Durenda Hurt is 05/27/16'@9'$ :73  Pt is aware

## 2016-06-04 ENCOUNTER — Other Ambulatory Visit: Payer: Self-pay | Admitting: *Deleted

## 2016-06-04 DIAGNOSIS — N182 Chronic kidney disease, stage 2 (mild): Secondary | ICD-10-CM

## 2016-06-04 DIAGNOSIS — C3492 Malignant neoplasm of unspecified part of left bronchus or lung: Secondary | ICD-10-CM

## 2016-06-04 DIAGNOSIS — E860 Dehydration: Secondary | ICD-10-CM

## 2016-06-04 DIAGNOSIS — F5102 Adjustment insomnia: Secondary | ICD-10-CM

## 2016-06-04 DIAGNOSIS — Z5111 Encounter for antineoplastic chemotherapy: Secondary | ICD-10-CM

## 2016-06-04 MED ORDER — OXYCODONE-ACETAMINOPHEN 5-325 MG PO TABS: 1.0000 | ORAL_TABLET | ORAL | 0 refills | Status: DC | PRN

## 2016-06-04 NOTE — Telephone Encounter (Signed)
Pt wife called with request for pain medication until he can be seen at [facility].

## 2016-06-08 ENCOUNTER — Ambulatory Visit: Payer: Medicare Other | Admitting: Internal Medicine

## 2016-06-08 ENCOUNTER — Other Ambulatory Visit: Payer: Medicare Other

## 2016-06-08 ENCOUNTER — Ambulatory Visit: Payer: Medicare Other

## 2016-06-18 ENCOUNTER — Telehealth: Payer: Self-pay | Admitting: *Deleted

## 2016-06-18 NOTE — Telephone Encounter (Signed)
Received call from pt's wife stating that pt was referred to [facility] for a clinical trial but can't do b/c his hgb is < 9 & needs to be 9 or >.  They want him to go back to Dr Julien Nordmann to get blood built back up.  She states that he had an endoscopy at [facility] in [place] was told there was no blood found.  She state he had a colonoscopy 2 years ago & they didn't think this needed to be repeated.  They want to f/u with Dr Julien Nordmann.  Marcie's ph # is [phone].  Message routed to Dr Julien Nordmann. Informed wife that he is out of office today & hopefully she will hear from someone next week.  She expressed understanding.

## 2016-06-21 ENCOUNTER — Other Ambulatory Visit: Payer: Self-pay | Admitting: Medical Oncology

## 2016-06-21 DIAGNOSIS — C3492 Malignant neoplasm of unspecified part of left bronchus or lung: Secondary | ICD-10-CM

## 2016-06-24 ENCOUNTER — Other Ambulatory Visit: Payer: Self-pay | Admitting: Medical Oncology

## 2016-06-24 ENCOUNTER — Telehealth: Payer: Self-pay | Admitting: Internal Medicine

## 2016-06-24 DIAGNOSIS — C3492 Malignant neoplasm of unspecified part of left bronchus or lung: Secondary | ICD-10-CM

## 2016-06-24 DIAGNOSIS — D649 Anemia, unspecified: Secondary | ICD-10-CM

## 2016-06-24 NOTE — Telephone Encounter (Signed)
Scheduled appt per 5/7 schedule message from Rn Diane - left message with appt date and time and sent reminder letter in the mail. Unit transfusion appt scheduled by Andria Frames.

## 2016-06-30 ENCOUNTER — Other Ambulatory Visit (HOSPITAL_BASED_OUTPATIENT_CLINIC_OR_DEPARTMENT_OTHER): Payer: Medicare Other

## 2016-06-30 ENCOUNTER — Other Ambulatory Visit: Payer: Self-pay | Admitting: *Deleted

## 2016-06-30 ENCOUNTER — Other Ambulatory Visit: Payer: Self-pay | Admitting: Medical Oncology

## 2016-06-30 ENCOUNTER — Ambulatory Visit (HOSPITAL_COMMUNITY)
Admission: RE | Admit: 2016-06-30 | Discharge: 2016-06-30 | Disposition: A | Discharge status: Home / Self Care | Payer: Medicare Other | Source: Ambulatory Visit | Attending: Internal Medicine | Admitting: Internal Medicine

## 2016-06-30 ENCOUNTER — Ambulatory Visit: Payer: Medicare Other

## 2016-06-30 ENCOUNTER — Encounter: Payer: Self-pay | Admitting: Internal Medicine

## 2016-06-30 ENCOUNTER — Ambulatory Visit (HOSPITAL_BASED_OUTPATIENT_CLINIC_OR_DEPARTMENT_OTHER): Payer: Medicare Other | Admitting: Internal Medicine

## 2016-06-30 DIAGNOSIS — C3432 Malignant neoplasm of lower lobe, left bronchus or lung: Secondary | ICD-10-CM

## 2016-06-30 DIAGNOSIS — N183 Chronic kidney disease, stage 3 (moderate): Secondary | ICD-10-CM

## 2016-06-30 DIAGNOSIS — R5383 Other fatigue: Secondary | ICD-10-CM | POA: Diagnosis not present

## 2016-06-30 DIAGNOSIS — I10 Essential (primary) hypertension: Secondary | ICD-10-CM

## 2016-06-30 DIAGNOSIS — R110 Nausea: Secondary | ICD-10-CM

## 2016-06-30 DIAGNOSIS — C787 Secondary malignant neoplasm of liver and intrahepatic bile duct: Secondary | ICD-10-CM | POA: Diagnosis not present

## 2016-06-30 DIAGNOSIS — C3492 Malignant neoplasm of unspecified part of left bronchus or lung: Secondary | ICD-10-CM

## 2016-06-30 DIAGNOSIS — D649 Anemia, unspecified: Secondary | ICD-10-CM

## 2016-06-30 LAB — CBC WITH DIFFERENTIAL/PLATELET
BASO%: 0.4 % (ref 0.0–2.0)
Basophils Absolute: 0 10*3/uL (ref 0.0–0.1)
EOS%: 1.2 % (ref 0.0–7.0)
Eosinophils Absolute: 0.1 10*3/uL (ref 0.0–0.5)
HCT: 27.5 % — ABNORMAL LOW (ref 38.4–49.9)
HGB: 9 g/dL — ABNORMAL LOW (ref 13.0–17.1)
LYMPH%: 9.9 % — AB (ref 14.0–49.0)
MCH: 20.3 pg — ABNORMAL LOW (ref 27.2–33.4)
MCHC: 32.6 g/dL (ref 32.0–36.0)
MCV: 62.3 fL — ABNORMAL LOW (ref 79.3–98.0)
MONO#: 1 10*3/uL — ABNORMAL HIGH (ref 0.1–0.9)
MONO%: 9.8 % (ref 0.0–14.0)
NEUT%: 78.7 % — AB (ref 39.0–75.0)
NEUTROS ABS: 8.2 10*3/uL — AB (ref 1.5–6.5)
PLATELETS: 348 10*3/uL (ref 140–400)
RBC: 4.42 10*6/uL (ref 4.20–5.82)
RDW: 21.9 % — ABNORMAL HIGH (ref 11.0–14.6)
WBC: 10.5 10*3/uL — AB (ref 4.0–10.3)
lymph#: 1 10*3/uL (ref 0.9–3.3)

## 2016-06-30 LAB — COMPREHENSIVE METABOLIC PANEL
ALBUMIN: 3.2 g/dL — AB (ref 3.5–5.0)
ALK PHOS: 156 U/L — AB (ref 40–150)
ALT: 19 U/L (ref 0–55)
AST: 21 U/L (ref 5–34)
Anion Gap: 10 mEq/L (ref 3–11)
BUN: 20.3 mg/dL (ref 7.0–26.0)
CALCIUM: 10 mg/dL (ref 8.4–10.4)
CO2: 25 mEq/L (ref 22–29)
Chloride: 104 mEq/L (ref 98–109)
Creatinine: 1.6 mg/dL — ABNORMAL HIGH (ref 0.7–1.3)
EGFR: 54 mL/min/{1.73_m2} — AB (ref >90)
Glucose: 128 mg/dl (ref 70–140)
POTASSIUM: 4.3 meq/L (ref 3.5–5.1)
Sodium: 139 mEq/L (ref 136–145)
Total Bilirubin: 0.22 mg/dL (ref 0.20–1.20)
Total Protein: 8.1 g/dL (ref 6.4–8.3)

## 2016-06-30 LAB — PREPARE RBC (CROSSMATCH)

## 2016-06-30 MED ORDER — HEPARIN SOD (PORK) LOCK FLUSH 100 UNIT/ML IV SOLN: 250.0000 [IU] | INTRAVENOUS | Status: DC | PRN

## 2016-06-30 MED ORDER — HEPARIN SOD (PORK) LOCK FLUSH 100 UNIT/ML IV SOLN
500.0000 [IU] | Freq: Every day | INTRAVENOUS | Status: AC | PRN
  Administered 2016-06-30: 500 [IU]
  Filled 2016-06-30: qty 5

## 2016-06-30 MED ORDER — ONDANSETRON HCL 8 MG PO TABS
ORAL_TABLET | ORAL | 1 refills | Status: DC
Start: 2016-06-30 — End: 2016-08-24

## 2016-06-30 MED ORDER — ACETAMINOPHEN 325 MG PO TABS
650.0000 mg | ORAL_TABLET | Freq: Once | ORAL | Status: AC
  Administered 2016-06-30: 650 mg via ORAL
  Filled 2016-06-30: qty 2

## 2016-06-30 MED ORDER — SODIUM CHLORIDE 0.9% FLUSH
10.0000 mL | INTRAVENOUS | Status: AC | PRN
  Administered 2016-06-30: 10 mL

## 2016-06-30 MED ORDER — INTEGRA PLUS PO CAPS: 1.0000 | ORAL_CAPSULE | Freq: Every morning | ORAL | 2 refills | Status: DC

## 2016-06-30 MED ORDER — DIPHENHYDRAMINE HCL 25 MG PO CAPS
25.0000 mg | ORAL_CAPSULE | Freq: Once | ORAL | Status: AC
  Administered 2016-06-30: 25 mg via ORAL
  Filled 2016-06-30: qty 1

## 2016-06-30 MED ORDER — SODIUM CHLORIDE 0.9 % IV SOLN
250.0000 mL | Freq: Once | INTRAVENOUS | Status: AC
  Administered 2016-06-30: 250 mL via INTRAVENOUS

## 2016-06-30 MED ORDER — SODIUM CHLORIDE 0.9% FLUSH: 3.0000 mL | INTRAVENOUS | Status: DC | PRN

## 2016-06-30 NOTE — Progress Notes (Deleted)
Provider: Mayme Genta  Diagnosis Association: Symptomatic anemia (D64.9)  Treatment: 2 units of PRBC's via port a cath  Patient received 2unitsof PRBC's via port a cath. Patient tolerated procedure well with no transfusion reaction. Patient given discharge instructions and states an understanding. Patient alert, oriented, and ambulatory at time of discharge.

## 2016-06-30 NOTE — Progress Notes (Signed)
Diagnosis Association: Symptomatic anemia (D64.9)  Provider: Dr. Julien Nordmann  Procedure: pt received 1 unit of PRBCs via porta cath.  Pt tolerated procedure well.   Post procedure: Pt alert, oriented and ambulatory to wheel chair. Pt received d/c instructions with verbal understanding.

## 2016-06-30 NOTE — Patient Instructions (Signed)
Implanted Port Home Guide An implanted port is a type of central line that is placed under the skin. Central lines are used to provide IV access when treatment or nutrition needs to be given through a person's veins. Implanted ports are used for long-term IV access. An implanted port may be placed because:  You need IV medicine that would be irritating to the small veins in your hands or arms.  You need long-term IV medicines, such as antibiotics.  You need IV nutrition for a long period.  You need frequent blood draws for lab tests.  You need dialysis.  Implanted ports are usually placed in the chest area, but they can also be placed in the upper arm, the abdomen, or the leg. An implanted port has two main parts:  Reservoir. The reservoir is round and will appear as a small, raised area under your skin. The reservoir is the part where a needle is inserted to give medicines or draw blood.  Catheter. The catheter is a thin, flexible tube that extends from the reservoir. The catheter is placed into a large vein. Medicine that is inserted into the reservoir goes into the catheter and then into the vein.  How will I care for my incision site? Do not get the incision site wet. Bathe or shower as directed by your health care provider. How is my port accessed? Special steps must be taken to access the port:  Before the port is accessed, a numbing cream can be placed on the skin. This helps numb the skin over the port site.  Your health care provider uses a sterile technique to access the port. ? Your health care provider must put on a mask and sterile gloves. ? The skin over your port is cleaned carefully with an antiseptic and allowed to dry. ? The port is gently pinched between sterile gloves, and a needle is inserted into the port.  Only "non-coring" port needles should be used to access the port. Once the port is accessed, a blood return should be checked. This helps ensure that the port  is in the vein and is not clogged.  If your port needs to remain accessed for a constant infusion, a clear (transparent) bandage will be placed over the needle site. The bandage and needle will need to be changed every week, or as directed by your health care provider.  Keep the bandage covering the needle clean and dry. Do not get it wet. Follow your health care provider's instructions on how to take a shower or bath while the port is accessed.  If your port does not need to stay accessed, no bandage is needed over the port.  What is flushing? Flushing helps keep the port from getting clogged. Follow your health care provider's instructions on how and when to flush the port. Ports are usually flushed with saline solution or a medicine called heparin. The need for flushing will depend on how the port is used.  If the port is used for intermittent medicines or blood draws, the port will need to be flushed: ? After medicines have been given. ? After blood has been drawn. ? As part of routine maintenance.  If a constant infusion is running, the port may not need to be flushed.  How long will my port stay implanted? The port can stay in for as long as your health care provider thinks it is needed. When it is time for the port to come out, surgery will be   done to remove it. The procedure is similar to the one performed when the port was put in. When should I seek immediate medical care? When you have an implanted port, you should seek immediate medical care if:  You notice a bad smell coming from the incision site.  You have swelling, redness, or drainage at the incision site.  You have more swelling or pain at the port site or the surrounding area.  You have a fever that is not controlled with medicine.  This information is not intended to replace advice given to you by your health care provider. Make sure you discuss any questions you have with your health care provider. Document  Released: 02/01/2005 Document Revised: 07/10/2015 Document Reviewed: 10/09/2012 Elsevier Interactive Patient Education  2017 [employer]  

## 2016-06-30 NOTE — Discharge Instructions (Signed)

## 2016-06-30 NOTE — Progress Notes (Signed)
[facility] [phone]   [phone]  OFFICE PROGRESS NOTE  [name], MD [place] [address]  DIAGNOSIS: Stage IV (T2a, N3, M1b) non-small cell lung cancer, squamous cell carcinoma presented with left lower lobe lung mass in April 2016.  PRIOR THERAPY: 1) Systemic chemotherapy with carboplatin for AUC of 5 given on day 1 and gemcitabine 1000 MG/M2 on days 1 and 8 every 3 weeks. Status post 6 cycles with partial response. 2) Nivolumab 240 MG IV every 2 weeks status post 4 cycles, last dose was given 03/18/2015 discontinued today secondary to disease progression. 3) systemic chemotherapy with docetaxel 75 MG/M2 and Cyramza 10 MG/KG every 3 weeks with Neulasta support status post 14 cycles. First dose was given 04/08/2015. Last dose was given 02/17/2016 discontinued secondary to disease progression. 4) chemotherapy was Navelbine 25 MG/M2 every 2 weeks is status post 4 cycles. Last dose was given 04/27/2016 discontinued secondary to disease progression.  CURRENT THERAPY: The patient is currently under evaluation for clinical trial at [facility].   INTERVAL HISTORY: Keith Holloway 56 y.o. male returns to the clinic today for follow-up visit accompanied by his wife. The patient is feeling fine today with no specific complaints except for fatigue. He also continues to have mild cough and left-sided chest pain. He is currently on pain medication with MS Contin and oxycodone. He has intermittent nausea and requesting refill of Zofran. He denied having any fever or chills. He denied having any nausea, vomiting, diarrhea or constipation. He denied having any recent weight loss or night sweats. He was referred to Dr. Durenda Hurt at [facility] for second opinion and consideration of enrollment in a clinical trial. He did not meet the criteria for the clinical trial because of low hemoglobin less than 9.0.  He is here today for evaluation and management of his anemia before enrollment in the trial.   MEDICAL HISTORY: Past Medical History:  Diagnosis Date  . Acute hyperglycemia 11/26/2015  . Alcohol abuse   . Arthritis   . Borderline hypertension   . CHF (congestive heart failure) ([place])   . CKD (chronic kidney disease), stage III   . Cocaine abuse    last [employer] postivie for cocaine 09-30-2015  . Depression   . Dyspnea on effort   . Elevated blood sugar 11/26/2015  . Goals of care, counseling/discussion 03/09/2016  . History of acute renal failure    09-30-2015  . History of hyperkalemia    admission 09-30-2015 potassiom 8.0  with medications currently 10-14-2015 down to 4.7 normal  . History of traumatic head injury    1987  w/ LOC   --- no residual  . Hyperglycemia 02/17/2016  . Hyperlipidemia   . Hypertension 12/16/2015  . 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 Memory disorder 08/22/2013   alcohol abuse  . Migraine   . Nocturia   . Non-small cell carcinoma of lung, stage 4 [facility]) oncologist-  dr Julien Nordmann   w/ METs to liver dx 04/ 2016--  Stage IV (T2a, N3, M1b), squamous cell carcinoma of left lower lobe---  systemic chemotherapy every 3 weeks and currently due to evidence for disease progression has started immunotherapy  . Pilonidal abscess    recurrent  . Pleural effusion, bilateral    per ct 10-09-2015  new small right effusion and left small to moderate effusion , increased  .  Polyneuropathy in diabetes(357.2)   . Port-a-cath in place   . Pulmonary nodules/lesions, multiple    pt has stage 4 lung cancer  . Recurrent left pleural effusion 01/06/2016  . Tobacco abuse counseling 10/09/2014  . Type 2 diabetes mellitus with insulin therapy ([place])   . Wears dentures    upper    ALLERGIES:  has No Known Allergies.  MEDICATIONS:  Current Outpatient Prescriptions  Medication Sig Dispense Refill  . acetaminophen (TYLENOL) 325 MG  tablet Take 2 tablets (650 mg total) by mouth every 6 (six) hours as needed for mild pain (or Fever >/= 101). 30 tablet 0  . amLODipine (NORVASC) 10 MG tablet Take 1 tablet (10 mg total) by mouth daily. 30 tablet 1  . aspirin EC 81 MG tablet Take 1 tablet by mouth every morning.     . benzonatate (TESSALON) 100 MG capsule Take 1 capsule (100 mg total) by mouth 3 (three) times daily as needed for cough. 20 capsule 0  . Cholecalciferol (VITAMIN D-3) 1000 units CAPS Take 1,000 Units by mouth daily.    Marland Kitchen docusate sodium (COLACE) 100 MG capsule Take 1 capsule (100 mg total) by mouth 2 (two) times daily. 40 capsule 0  . ferrous sulfate 325 (65 FE) MG tablet Take by mouth.    Marland Kitchen ibuprofen (ADVIL,MOTRIN) 200 MG tablet Take 400 mg by mouth every 6 (six) hours as needed for moderate pain.    Marland Kitchen insulin NPH-regular Human (NOVOLIN 70/30) (70-30) 100 UNIT/ML injection Inject 35 Units into the skin 2 (two) times daily with a meal. (Patient taking differently: Inject 20 Units into the skin 2 (two) times daily with a meal. ONLY IF CBG IS OVER 200.) 10 mL 11  . lactulose (CHRONULAC) 10 GM/15ML solution Take by mouth.    . lidocaine-prilocaine (EMLA) cream APPLY 1 APPLICATION TOPICALLY AS NEEDED. 30 g 0  . morphine (MS CONTIN) 30 MG 12 hr tablet Take by mouth.    . ondansetron (ZOFRAN) 8 MG tablet TAKE 1 TABLET EVERY 8 HOURS AS NEEDED FOR NAUSEA AND VOMITING 20 tablet 1  . oxyCODONE (OXY IR/ROXICODONE) 5 MG immediate release tablet Take by mouth.    . prochlorperazine (COMPAZINE) 10 MG tablet TAKE 1 TABLET (10 MG TOTAL) BY MOUTH EVERY 6 (SIX) HOURS AS NEEDED FOR NAUSEA OR VOMITING. 30 tablet 0  . senna (SENOKOT) 8.6 MG tablet Take by mouth.    . simvastatin (ZOCOR) 40 MG tablet Take 1 tablet (40 mg total) by mouth daily. (Patient taking differently: Take 40 mg by mouth every morning. ) 30 tablet 3  . temazepam (RESTORIL) 30 MG capsule TAKE ONE CAPSULE AT BEDTIME 30 capsule 0  . vitamin C (ASCORBIC ACID) 500 MG tablet  Take 500 mg by mouth 2 (two) times daily.      No current facility-administered medications for this visit.     SURGICAL HISTORY:  Past Surgical History:  Procedure Laterality Date  . CATARACT EXTRACTION, BILATERAL  08/2012  . PILONIDAL CYST EXCISION N/A 10/17/2015   Procedure: EXCISION PILONIDAL DISEASE;  Surgeon: Leighton Ruff, MD;  Location: [facility];  Service: General;  Laterality: N/A;  . PORTACATH PLACEMENT  01/30/2015  . RETINAL DETACHMENT SURGERY Bilateral 04/2012    REVIEW OF SYSTEMS:  Constitutional: positive for fatigue Eyes: negative Ears, nose, mouth, throat, and face: negative Respiratory: positive for cough, dyspnea on exertion and pleurisy/chest pain Cardiovascular: negative Gastrointestinal: negative Genitourinary:negative Integument/breast: negative Hematologic/lymphatic: negative Musculoskeletal:negative Neurological: negative Behavioral/Psych: negative Endocrine: negative Allergic/Immunologic:  negative   PHYSICAL EXAMINATION: General appearance: alert, cooperative, fatigued and no distress Head: Normocephalic, without obvious abnormality, atraumatic Neck: no adenopathy, no JVD, supple, symmetrical, trachea midline and thyroid not enlarged, symmetric, no tenderness/mass/nodules Lymph nodes: Cervical, supraclavicular, and axillary nodes normal. Resp: wheezes bilaterally Back: symmetric, no curvature. ROM normal. No CVA tenderness. Cardio: regular rate and rhythm, S1, S2 normal, no murmur, click, rub or gallop GI: soft, non-tender; bowel sounds normal; no masses,  no organomegaly Extremities: extremities normal, atraumatic, no cyanosis or edema Neurologic: Alert and oriented X 3, normal strength and tone. Normal symmetric reflexes. Normal coordination and gait  ECOG PERFORMANCE STATUS: 1 - Symptomatic but completely ambulatory  Blood pressure 116/79, pulse 89, temperature 98.6 F (37 C), temperature source Oral, resp. rate 20, height '5\' 11"'$   (1.803 m), weight 166 lb 14.4 oz (75.7 kg), SpO2 94 %.  LABORATORY DATA: Lab Results  Component Value Date   WBC 10.5 (H) 06/30/2016   HGB 9.0 (L) 06/30/2016   HCT 27.5 (L) 06/30/2016   MCV 62.3 (L) 06/30/2016   PLT 348 06/30/2016      Chemistry      Component Value Date/Time   NA 139 06/30/2016 0919   K 4.3 06/30/2016 0919   CL 111 11/19/2015 0448   CO2 25 06/30/2016 0919   BUN 20.3 06/30/2016 0919   CREATININE 1.6 (H) 06/30/2016 0919   GLU 396 (H) 10/28/2015 1249      Component Value Date/Time   CALCIUM 10.0 06/30/2016 0919   ALKPHOS 156 (H) 06/30/2016 0919   AST 21 06/30/2016 0919   ALT 19 06/30/2016 0919   BILITOT 0.22 06/30/2016 0919       RADIOGRAPHIC STUDIES: No results found.  ASSESSMENT AND PLAN:  This is a very pleasant 56 years old African-American male with metastatic non-small cell lung cancer, squamous cell carcinoma. He is status post several chemotherapy and immunotherapy regimens discontinued secondary to disease progression. He is currently undergoing evaluation for enrollment in a clinical trial at [facility] with [facility] inhibitor. His hemoglobin has to be above 9.0 for enrollment in the trial. We will arrange for the patient to receive 2 units of PRBCs transfusion today. I will also start the patient on Integra +1 capsule by mouth daily. For pain management, he will continue on his current medication with MS Contin and oxycodone. For hypertension the patient will continue on his current blood pressure medication. For the intermittent nausea, I gave him a refill of Zofran. I will see him on as-needed basis at this point. He was advised to call immediately if he has any concerning symptoms in the interval. The patient voices understanding of current disease status and treatment options and is in agreement with the current care plan. All questions were answered. The patient knows to call the clinic with any problems, questions or  concerns. We can certainly see the patient much sooner if necessary.  Disclaimer: This note was dictated with voice recognition software. Similar sounding words can inadvertently be transcribed and may not be corrected upon review.

## 2016-07-01 LAB — TYPE AND SCREEN
ABO/RH(D): A POS
Antibody Screen: NEGATIVE
UNIT DIVISION: 0

## 2016-07-01 LAB — BPAM RBC
Blood Product Expiration Date: 201806012359
ISSUE DATE / TIME: 201805161348
Unit Type and Rh: 6200

## 2016-07-02 ENCOUNTER — Telehealth: Payer: Self-pay | Admitting: Internal Medicine

## 2016-07-02 NOTE — Telephone Encounter (Signed)
Per 5/16 los - No f/u appt is needed.

## 2016-07-26 ENCOUNTER — Other Ambulatory Visit: Payer: Self-pay | Admitting: *Deleted

## 2016-07-26 MED ORDER — OXYCODONE HCL 5 MG PO TABS: 5.0000 mg | ORAL_TABLET | Freq: Four times a day (QID) | ORAL | 0 refills | Status: DC | PRN

## 2016-07-26 MED ORDER — INTEGRA PLUS PO CAPS: 1.0000 | ORAL_CAPSULE | Freq: Every morning | ORAL | 2 refills | Status: DC

## 2016-07-27 ENCOUNTER — Telehealth: Payer: Self-pay | Admitting: Internal Medicine

## 2016-07-27 NOTE — Telephone Encounter (Signed)
Spoke to wife. Scheduled appt per 6/11 sch message from [facility] . Patient is aware of appt date and time.

## 2016-07-28 ENCOUNTER — Other Ambulatory Visit (HOSPITAL_BASED_OUTPATIENT_CLINIC_OR_DEPARTMENT_OTHER): Payer: Medicare Other

## 2016-07-28 ENCOUNTER — Ambulatory Visit (HOSPITAL_BASED_OUTPATIENT_CLINIC_OR_DEPARTMENT_OTHER): Payer: Medicare Other | Admitting: Internal Medicine

## 2016-07-28 ENCOUNTER — Encounter: Payer: Self-pay | Admitting: Internal Medicine

## 2016-07-28 ENCOUNTER — Encounter: Payer: Self-pay | Admitting: *Deleted

## 2016-07-28 ENCOUNTER — Telehealth: Payer: Self-pay | Admitting: Internal Medicine

## 2016-07-28 VITALS — BP 125/78 | HR 89 | Temp 98.5°F | Resp 18 | Ht 71.0 in | Wt 158.4 lb

## 2016-07-28 DIAGNOSIS — N183 Chronic kidney disease, stage 3 unspecified: Secondary | ICD-10-CM

## 2016-07-28 DIAGNOSIS — C3492 Malignant neoplasm of unspecified part of left bronchus or lung: Secondary | ICD-10-CM

## 2016-07-28 DIAGNOSIS — Z716 Tobacco abuse counseling: Secondary | ICD-10-CM

## 2016-07-28 DIAGNOSIS — C3432 Malignant neoplasm of lower lobe, left bronchus or lung: Secondary | ICD-10-CM

## 2016-07-28 DIAGNOSIS — Z5111 Encounter for antineoplastic chemotherapy: Secondary | ICD-10-CM

## 2016-07-28 DIAGNOSIS — Z7189 Other specified counseling: Secondary | ICD-10-CM

## 2016-07-28 LAB — COMPREHENSIVE METABOLIC PANEL
ALT: 33 U/L (ref 0–55)
ANION GAP: 7 meq/L (ref 3–11)
AST: 84 U/L — ABNORMAL HIGH (ref 5–34)
Albumin: 3.2 g/dL — ABNORMAL LOW (ref 3.5–5.0)
Alkaline Phosphatase: 238 U/L — ABNORMAL HIGH (ref 40–150)
BUN: 20.9 mg/dL (ref 7.0–26.0)
CHLORIDE: 103 meq/L (ref 98–109)
CO2: 27 meq/L (ref 22–29)
CREATININE: 1.7 mg/dL — AB (ref 0.7–1.3)
Calcium: 10 mg/dL (ref 8.4–10.4)
EGFR: 52 mL/min/{1.73_m2} — ABNORMAL LOW (ref >90)
Glucose: 171 mg/dl — ABNORMAL HIGH (ref 70–140)
POTASSIUM: 4.7 meq/L (ref 3.5–5.1)
Sodium: 137 mEq/L (ref 136–145)
Total Bilirubin: 0.26 mg/dL (ref 0.20–1.20)
Total Protein: 8.1 g/dL (ref 6.4–8.3)

## 2016-07-28 LAB — CBC WITH DIFFERENTIAL/PLATELET
BASO%: 0.2 % (ref 0.0–2.0)
Basophils Absolute: 0 10*3/uL (ref 0.0–0.1)
EOS ABS: 0.2 10*3/uL (ref 0.0–0.5)
EOS%: 0.9 % (ref 0.0–7.0)
HCT: 30.8 % — ABNORMAL LOW (ref 38.4–49.9)
HGB: 10 g/dL — ABNORMAL LOW (ref 13.0–17.1)
LYMPH%: 7.9 % — AB (ref 14.0–49.0)
MCH: 20.7 pg — ABNORMAL LOW (ref 27.2–33.4)
MCHC: 32.5 g/dL (ref 32.0–36.0)
MCV: 63.9 fL — ABNORMAL LOW (ref 79.3–98.0)
MONO#: 1.6 10*3/uL — ABNORMAL HIGH (ref 0.1–0.9)
MONO%: 9.6 % (ref 0.0–14.0)
NEUT%: 81.4 % — ABNORMAL HIGH (ref 39.0–75.0)
NEUTROS ABS: 13.5 10*3/uL — AB (ref 1.5–6.5)
PLATELETS: 336 10*3/uL (ref 140–400)
RBC: 4.82 10*6/uL (ref 4.20–5.82)
RDW: 22.2 % — ABNORMAL HIGH (ref 11.0–14.6)
WBC: 16.5 10*3/uL — AB (ref 4.0–10.3)
lymph#: 1.3 10*3/uL (ref 0.9–3.3)
nRBC: 0 % (ref 0–0)

## 2016-07-28 MED ORDER — LACTULOSE 10 GM/15ML PO SOLN: ORAL | 0 refills | Status: DC

## 2016-07-28 MED ORDER — NICOTINE 14 MG/24HR TD PT24: 14.0000 mg | MEDICATED_PATCH | Freq: Every day | TRANSDERMAL | 0 refills | Status: DC

## 2016-07-28 NOTE — Telephone Encounter (Signed)
Scheduled appt per 6/13 los - patient still in the facility will come in and pick up schedule.

## 2016-07-28 NOTE — Progress Notes (Signed)
[facility] [phone]   Fax:(336) [phone]  OFFICE PROGRESS NOTE  [name], MD [place] [address]  DIAGNOSIS: Stage IV (T2a, N3, M1b) non-small cell lung cancer, squamous cell carcinoma presented with left lower lobe lung mass in April 2016.  PRIOR THERAPY: 1) Systemic chemotherapy with carboplatin for AUC of 5 given on day 1 and gemcitabine 1000 MG/M2 on days 1 and 8 every 3 weeks. Status post 6 cycles with partial response. 2) Nivolumab 240 MG IV every 2 weeks status post 4 cycles, last dose was given 03/18/2015 discontinued today secondary to disease progression. 3) systemic chemotherapy with docetaxel 75 MG/M2 and Cyramza 10 MG/KG every 3 weeks with Neulasta support status post 14 cycles. First dose was given 04/08/2015. Last dose was given 02/17/2016 discontinued secondary to disease progression. 4) chemotherapy with Navelbine 25 MG/M2 every 2 weeks is status post 4 cycles. Last dose was given 04/27/2016 discontinued secondary to disease progression.  CURRENT THERAPY: None.  INTERVAL HISTORY: Keith Holloway 56 y.o. male returns to the clinic today for follow-up visit accompanied by his wife. The patient is feeling fine today with no specific complaints except for chest tightness and mild shortness of breath with no cough or hemoptysis. He has several pounds. He denied having any nausea, vomiting, diarrhea or constipation. He has no fever or chills. He has no headache or visual changes. He is expected to enroll in a clinical trial at [facility] but unfortunately that arm was closed. He is here today for evaluation and recommendation regarding his condition. He'll return back to smoking and requesting nicotine patch to help quit smoking.   MEDICAL HISTORY: Past Medical History:  Diagnosis Date  . Acute hyperglycemia 11/26/2015  . Alcohol abuse   . Arthritis   . Borderline hypertension   . CHF (congestive  heart failure) ([place])   . CKD (chronic kidney disease), stage III   . Cocaine abuse    last [employer] postivie for cocaine 09-30-2015  . Depression   . Dyspnea on effort   . Elevated blood sugar 11/26/2015  . Goals of care, counseling/discussion 03/09/2016  . History of acute renal failure    09-30-2015  . History of hyperkalemia    admission 09-30-2015 potassiom 8.0  with medications currently 10-14-2015 down to 4.7 normal  . History of traumatic head injury    1987  w/ LOC   --- no residual  . Hyperglycemia 02/17/2016  . Hyperlipidemia   . Hypertension 12/16/2015  . 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 Memory disorder 08/22/2013   alcohol abuse  . Migraine   . Nocturia   . Non-small cell carcinoma of lung, stage 4 [facility]) oncologist-  dr Julien Nordmann   w/ METs to liver dx 04/ 2016--  Stage IV (T2a, N3, M1b), squamous cell carcinoma of left lower lobe---  systemic chemotherapy every 3 weeks and currently due to evidence for disease progression has started immunotherapy  . Pilonidal abscess    recurrent  . Pleural effusion, bilateral    per ct 10-09-2015  new small right effusion and left small to moderate effusion , increased  . Polyneuropathy in diabetes(357.2)   . Port-a-cath in place   . Pulmonary nodules/lesions, multiple    pt has stage 4 lung cancer  . Recurrent left pleural effusion 01/06/2016  . Tobacco abuse counseling 10/09/2014  . Type 2 diabetes mellitus with insulin  therapy ([place])   . Wears dentures    upper    ALLERGIES:  has No Known Allergies.  MEDICATIONS:  Current Outpatient Prescriptions  Medication Sig Dispense Refill  . acetaminophen (TYLENOL) 325 MG tablet Take 2 tablets (650 mg total) by mouth every 6 (six) hours as needed for mild pain (or Fever >/= 101). 30 tablet 0  . amLODipine (NORVASC) 10 MG tablet Take 1 tablet (10 mg total) by mouth daily. 30 tablet 1  . aspirin EC 81 MG tablet Take 1 tablet by mouth every  morning.     . benzonatate (TESSALON) 100 MG capsule Take 1 capsule (100 mg total) by mouth 3 (three) times daily as needed for cough. 20 capsule 0  . Cholecalciferol (VITAMIN D-3) 1000 units CAPS Take 1,000 Units by mouth daily.    Marland Kitchen docusate sodium (COLACE) 100 MG capsule Take 1 capsule (100 mg total) by mouth 2 (two) times daily. 40 capsule 0  . FeFum-FePoly-FA-B Cmp-C-Biot (INTEGRA PLUS) CAPS Take 1 capsule by mouth every morning. 30 capsule 2  . ferrous sulfate 325 (65 FE) MG tablet Take by mouth.    Marland Kitchen ibuprofen (ADVIL,MOTRIN) 200 MG tablet Take 400 mg by mouth every 6 (six) hours as needed for moderate pain.    Marland Kitchen insulin NPH-regular Human (NOVOLIN 70/30) (70-30) 100 UNIT/ML injection Inject 35 Units into the skin 2 (two) times daily with a meal. (Patient taking differently: Inject 20 Units into the skin 2 (two) times daily with a meal. ONLY IF CBG IS OVER 200.) 10 mL 11  . lactulose (CHRONULAC) 10 GM/15ML solution TAKE 30 MLS BY MOUTH 2 (TWO) TIMES DAILY.  0  . lidocaine-prilocaine (EMLA) cream APPLY 1 APPLICATION TOPICALLY AS NEEDED. 30 g 0  . morphine (MS CONTIN) 30 MG 12 hr tablet Take by mouth.    . ondansetron (ZOFRAN) 8 MG tablet TAKE 1 TABLET EVERY 8 HOURS AS NEEDED FOR NAUSEA AND VOMITING 20 tablet 1  . oxyCODONE (OXY IR/ROXICODONE) 5 MG immediate release tablet Take 1 tablet (5 mg total) by mouth every 6 (six) hours as needed for severe pain. 30 tablet 0  . prochlorperazine (COMPAZINE) 10 MG tablet TAKE 1 TABLET (10 MG TOTAL) BY MOUTH EVERY 6 (SIX) HOURS AS NEEDED FOR NAUSEA OR VOMITING. 30 tablet 0  . senna (SENOKOT) 8.6 MG tablet Take by mouth.    . simvastatin (ZOCOR) 40 MG tablet Take 1 tablet (40 mg total) by mouth daily. (Patient taking differently: Take 40 mg by mouth every morning. ) 30 tablet 3  . temazepam (RESTORIL) 30 MG capsule TAKE ONE CAPSULE AT BEDTIME 30 capsule 0  . vitamin C (ASCORBIC ACID) 500 MG tablet Take 500 mg by mouth 2 (two) times daily.      No current  facility-administered medications for this visit.     SURGICAL HISTORY:  Past Surgical History:  Procedure Laterality Date  . CATARACT EXTRACTION, BILATERAL  08/2012  . PILONIDAL CYST EXCISION N/A 10/17/2015   Procedure: EXCISION PILONIDAL DISEASE;  Surgeon: Leighton Ruff, MD;  Location: [facility];  Service: General;  Laterality: N/A;  . PORTACATH PLACEMENT  01/30/2015  . RETINAL DETACHMENT SURGERY Bilateral 04/2012    REVIEW OF SYSTEMS:  Constitutional: positive for fatigue and weight loss Eyes: negative Ears, nose, mouth, throat, and face: negative Respiratory: positive for cough, dyspnea on exertion and pleurisy/chest pain Cardiovascular: negative Gastrointestinal: negative Genitourinary:negative Integument/breast: negative Hematologic/lymphatic: negative Musculoskeletal:negative Neurological: negative Behavioral/Psych: negative Endocrine: negative Allergic/Immunologic: negative   PHYSICAL  EXAMINATION: General appearance: alert, cooperative, fatigued and no distress Head: Normocephalic, without obvious abnormality, atraumatic Neck: no adenopathy, no JVD, supple, symmetrical, trachea midline and thyroid not enlarged, symmetric, no tenderness/mass/nodules Lymph nodes: Cervical, supraclavicular, and axillary nodes normal. Resp: wheezes bilaterally Back: symmetric, no curvature. ROM normal. No CVA tenderness. Cardio: regular rate and rhythm, S1, S2 normal, no murmur, click, rub or gallop GI: soft, non-tender; bowel sounds normal; no masses,  no organomegaly Extremities: extremities normal, atraumatic, no cyanosis or edema Neurologic: Alert and oriented X 3, normal strength and tone. Normal symmetric reflexes. Normal coordination and gait  ECOG PERFORMANCE STATUS: 1 - Symptomatic but completely ambulatory  Blood pressure 125/78, pulse 89, temperature 98.5 F (36.9 C), temperature source Oral, resp. rate 18, height 5\' 11"  (1.803 m), weight 158 lb 6.4 oz (71.8  kg), SpO2 96 %.  LABORATORY DATA: Lab Results  Component Value Date   WBC 16.5 (H) 07/28/2016   HGB 10.0 (L) 07/28/2016   HCT 30.8 (L) 07/28/2016   MCV 63.9 (L) 07/28/2016   PLT 336 07/28/2016      Chemistry      Component Value Date/Time   NA 139 06/30/2016 0919   K 4.3 06/30/2016 0919   CL 111 11/19/2015 0448   CO2 25 06/30/2016 0919   BUN 20.3 06/30/2016 0919   CREATININE 1.6 (H) 06/30/2016 0919   GLU 396 (H) 10/28/2015 1249      Component Value Date/Time   CALCIUM 10.0 06/30/2016 0919   ALKPHOS 156 (H) 06/30/2016 0919   AST 21 06/30/2016 0919   ALT 19 06/30/2016 0919   BILITOT 0.22 06/30/2016 0919       RADIOGRAPHIC STUDIES: No results found.  ASSESSMENT AND PLAN:  This is a very pleasant 56 years old African-American male with metastatic non-small cell lung cancer, squamous cell carcinoma. He underwent several treatment in the past including carboplatin and gemcitabine followed by immunotherapy with Nivolumab status post 4 cycles discontinued secondary to disease progression followed by docetaxel and Cyramza status post 14 cycles discontinued secondary to disease progression then the patient was treated with Navelbine for 4 cycles discontinued secondary to disease progression. He was evaluated for enrollment in a clinical trial at [facility] with [facility] inhibitor but unfortunately that time was closed. He is here today for evaluation and recommendation regarding his condition. I discussed with the patient several options for management of his condition including enrollment in a clinical trial according to the SWOG protocol S1400 versus treatment with oral Gilotrif versus palliative care and hospice referral. The patient is still interested in treatment and he is considering the enrollment in the clinical trial if he is eligible. We will check with pathology to see if he has enough tissue for molecular studies but the patient is also willing to consider  repeat biopsy of one of the liver lesion if needed. I will ask the research nurse to see him today for evaluation of his eligibility. I will see him back for follow-up visit in a few weeks after the molecular studies if the patient is eligible for the clinical trial. For smoking cessation, I will give the patient prescription for nicotine patch. For constipation, I will give him a refill for lactulose. For pain management he will continue his current treatment with MS Contin and oxycodone. He was advised to call immediately if he has any concerning symptoms in the interval. The patient voices understanding of current disease status and treatment options and is in agreement with the  current care plan. All questions were answered. The patient knows to call the clinic with any problems, questions or concerns. We can certainly see the patient much sooner if necessary.  Disclaimer: This note was dictated with voice recognition software. Similar sounding words can inadvertently be transcribed and may not be corrected upon review.

## 2016-07-29 ENCOUNTER — Other Ambulatory Visit: Payer: Self-pay | Admitting: Internal Medicine

## 2016-07-29 ENCOUNTER — Other Ambulatory Visit: Payer: Self-pay | Admitting: Emergency Medicine

## 2016-07-29 DIAGNOSIS — C3492 Malignant neoplasm of unspecified part of left bronchus or lung: Secondary | ICD-10-CM

## 2016-07-29 MED ORDER — OXYCODONE HCL 5 MG PO TABS: 5.0000 mg | ORAL_TABLET | Freq: Four times a day (QID) | ORAL | 0 refills | Status: DC | PRN

## 2016-08-03 ENCOUNTER — Other Ambulatory Visit: Payer: Self-pay | Admitting: Radiology

## 2016-08-04 ENCOUNTER — Other Ambulatory Visit: Payer: Self-pay | Admitting: Radiology

## 2016-08-05 ENCOUNTER — Ambulatory Visit (HOSPITAL_COMMUNITY)
Admission: RE | Admit: 2016-08-05 | Discharge: 2016-08-05 | Disposition: A | Discharge status: Home / Self Care | Payer: Medicare Other | Source: Ambulatory Visit | Attending: Internal Medicine | Admitting: Internal Medicine

## 2016-08-05 ENCOUNTER — Encounter: Payer: Self-pay | Admitting: Internal Medicine

## 2016-08-05 ENCOUNTER — Other Ambulatory Visit: Payer: Self-pay | Admitting: Internal Medicine

## 2016-08-05 ENCOUNTER — Encounter (HOSPITAL_COMMUNITY): Payer: Self-pay

## 2016-08-05 VITALS — BP 115/77 | HR 102 | Temp 99.0°F | Resp 16

## 2016-08-05 DIAGNOSIS — Z794 Long term (current) use of insulin: Secondary | ICD-10-CM | POA: Insufficient documentation

## 2016-08-05 DIAGNOSIS — E1142 Type 2 diabetes mellitus with diabetic polyneuropathy: Secondary | ICD-10-CM | POA: Insufficient documentation

## 2016-08-05 DIAGNOSIS — E785 Hyperlipidemia, unspecified: Secondary | ICD-10-CM | POA: Insufficient documentation

## 2016-08-05 DIAGNOSIS — I130 Hypertensive heart and chronic kidney disease with heart failure and stage 1 through stage 4 chronic kidney disease, or unspecified chronic kidney disease: Secondary | ICD-10-CM | POA: Insufficient documentation

## 2016-08-05 DIAGNOSIS — Z9221 Personal history of antineoplastic chemotherapy: Secondary | ICD-10-CM | POA: Insufficient documentation

## 2016-08-05 DIAGNOSIS — E11319 Type 2 diabetes mellitus with unspecified diabetic retinopathy without macular edema: Secondary | ICD-10-CM | POA: Insufficient documentation

## 2016-08-05 DIAGNOSIS — C3490 Malignant neoplasm of unspecified part of unspecified bronchus or lung: Secondary | ICD-10-CM

## 2016-08-05 DIAGNOSIS — C787 Secondary malignant neoplasm of liver and intrahepatic bile duct: Secondary | ICD-10-CM | POA: Insufficient documentation

## 2016-08-05 DIAGNOSIS — C3492 Malignant neoplasm of unspecified part of left bronchus or lung: Secondary | ICD-10-CM | POA: Insufficient documentation

## 2016-08-05 DIAGNOSIS — N183 Chronic kidney disease, stage 3 (moderate): Secondary | ICD-10-CM | POA: Insufficient documentation

## 2016-08-05 DIAGNOSIS — Z87891 Personal history of nicotine dependence: Secondary | ICD-10-CM | POA: Insufficient documentation

## 2016-08-05 DIAGNOSIS — Z7982 Long term (current) use of aspirin: Secondary | ICD-10-CM | POA: Insufficient documentation

## 2016-08-05 DIAGNOSIS — E1122 Type 2 diabetes mellitus with diabetic chronic kidney disease: Secondary | ICD-10-CM | POA: Insufficient documentation

## 2016-08-05 DIAGNOSIS — I509 Heart failure, unspecified: Secondary | ICD-10-CM | POA: Insufficient documentation

## 2016-08-05 HISTORY — DX: Unspecified visual loss: H54.7

## 2016-08-05 HISTORY — DX: Personal history of other medical treatment: Z92.89

## 2016-08-05 HISTORY — DX: Dyspnea, unspecified: R06.00

## 2016-08-05 LAB — COMPREHENSIVE METABOLIC PANEL
ALBUMIN: 3.3 g/dL — AB (ref 3.5–5.0)
ALT: 48 U/L (ref 17–63)
ANION GAP: 9 (ref 5–15)
AST: 116 U/L — ABNORMAL HIGH (ref 15–41)
Alkaline Phosphatase: 266 U/L — ABNORMAL HIGH (ref 38–126)
BUN: 22 mg/dL — ABNORMAL HIGH (ref 6–20)
CHLORIDE: 100 mmol/L — AB (ref 101–111)
CO2: 27 mmol/L (ref 22–32)
Calcium: 9.6 mg/dL (ref 8.9–10.3)
Creatinine, Ser: 1.86 mg/dL — ABNORMAL HIGH (ref 0.61–1.24)
GFR calc Af Amer: 45 mL/min — ABNORMAL LOW (ref >60)
GFR calc non Af Amer: 39 mL/min — ABNORMAL LOW (ref >60)
GLUCOSE: 132 mg/dL — AB (ref 65–99)
POTASSIUM: 4.6 mmol/L (ref 3.5–5.1)
Sodium: 136 mmol/L (ref 135–145)
Total Bilirubin: 0.5 mg/dL (ref 0.3–1.2)
Total Protein: 8.1 g/dL (ref 6.5–8.1)

## 2016-08-05 LAB — CBC WITH DIFFERENTIAL/PLATELET
BASOS ABS: 0 10*3/uL (ref 0.0–0.1)
Basophils Relative: 0 %
EOS ABS: 0 10*3/uL (ref 0.0–0.7)
Eosinophils Relative: 0 %
HCT: 27.4 % — ABNORMAL LOW (ref 39.0–52.0)
Hemoglobin: 9.3 g/dL — ABNORMAL LOW (ref 13.0–17.0)
LYMPHS ABS: 1.4 10*3/uL (ref 0.7–4.0)
Lymphocytes Relative: 7 %
MCH: 20.9 pg — ABNORMAL LOW (ref 26.0–34.0)
MCHC: 33.9 g/dL (ref 30.0–36.0)
MCV: 61.6 fL — ABNORMAL LOW (ref 78.0–100.0)
MONO ABS: 2 10*3/uL — AB (ref 0.1–1.0)
Monocytes Relative: 10 %
NEUTROS ABS: 17 10*3/uL — AB (ref 1.7–7.7)
Neutrophils Relative %: 83 %
PLATELETS: 312 10*3/uL (ref 150–400)
RBC: 4.45 MIL/uL (ref 4.22–5.81)
RDW: 21.2 % — AB (ref 11.5–15.5)
WBC: 20.4 10*3/uL — AB (ref 4.0–10.5)

## 2016-08-05 LAB — PROTIME-INR
INR: 1.18
PROTHROMBIN TIME: 15.1 s (ref 11.4–15.2)

## 2016-08-05 LAB — GLUCOSE, CAPILLARY: GLUCOSE-CAPILLARY: 167 mg/dL — AB (ref 65–99)

## 2016-08-05 MED ORDER — HEPARIN SOD (PORK) LOCK FLUSH 100 UNIT/ML IV SOLN
500.0000 [IU] | INTRAVENOUS | Status: AC | PRN
  Administered 2016-08-05: 500 [IU]
  Filled 2016-08-05: qty 5

## 2016-08-05 MED ORDER — SODIUM CHLORIDE 0.9 % IV SOLN
INTRAVENOUS | Status: DC
  Administered 2016-08-05: 12:00:00 via INTRAVENOUS

## 2016-08-05 MED ORDER — MIDAZOLAM HCL 2 MG/2ML IJ SOLN
INTRAMUSCULAR | Status: AC
  Filled 2016-08-05: qty 4

## 2016-08-05 MED ORDER — FENTANYL CITRATE (PF) 100 MCG/2ML IJ SOLN
INTRAMUSCULAR | Status: AC
  Filled 2016-08-05: qty 4

## 2016-08-05 MED ORDER — MIDAZOLAM HCL 2 MG/2ML IJ SOLN
INTRAMUSCULAR | Status: AC | PRN
  Administered 2016-08-05 (×2): 1 mg via INTRAVENOUS

## 2016-08-05 MED ORDER — FENTANYL CITRATE (PF) 100 MCG/2ML IJ SOLN
INTRAMUSCULAR | Status: AC | PRN
  Administered 2016-08-05 (×2): 50 ug via INTRAVENOUS

## 2016-08-05 MED ORDER — OXYCODONE HCL 5 MG PO TABS: 5.0000 mg | ORAL_TABLET | Freq: Four times a day (QID) | ORAL | 0 refills | Status: DC | PRN

## 2016-08-05 NOTE — H&P (Signed)
Referring Physician(s): Mohamed,Mohamed  Supervising Physician: Arne Cleveland  Patient Status:  [facility]  Chief Complaint:  "I'm having a biopsy"  Subjective: Patient familiar to IR service from prior right chest wall Port-A-Cath in 2016, thoracenteses in 2017. 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 Recent imaging has revealed progressive hepatic disease. He presents today for image guided liver lesion biopsy for molecular studies. Currently denies fever, headache, worsening dyspnea, cough, back pain, vomiting or abnormal bleeding. He does have some occasional chest and abdominal discomfort as well as intermittent nausea. Past Medical History:  Diagnosis Date  . Acute hyperglycemia 11/26/2015  . Alcohol abuse   . Arthritis   . Borderline hypertension   . CHF (congestive heart failure) ([place])   . CKD (chronic kidney disease), stage III   . Cocaine abuse    last [employer] post[employer] for cocaine 09-30-2015  . Depression   . Dyspnea on effort   . Elevated blood sugar 11/26/2015  . Goals of care, counseling/discussion 03/09/2016  . History of acute renal failure    09-30-2015  . History of hyperkalemia    admission 09-30-2015 potassiom 8.0  with medications currently 10-14-2015 down to 4.7 normal  . History of traumatic head injury    1987  w/ LOC   --- no residual  . Hyperglycemia 02/17/2016  . Hyperlipidemia   . Hypertension 12/16/2015  . 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 Memory disorder 08/22/2013   alcohol abuse  . Migraine   . Nocturia   . Non-small cell carcinoma of lung, stage 4 [facility]) oncologist-  dr Julien Nordmann   w/ METs to liver dx 04/ 2016--  Stage IV (T2a, N3, M1b), squamous cell carcinoma of left lower lobe---  systemic chemotherapy every 3 weeks and currently due to evidence for disease progression has started immunotherapy  . Pilonidal  abscess    recurrent  . Pleural effusion, bilateral    per ct 10-09-2015  new small right effusion and left small to moderate effusion , increased  . Polyneuropathy in diabetes(357.2)   . Port-a-cath in place   . Pulmonary nodules/lesions, multiple    pt has stage 4 lung cancer  . Recurrent left pleural effusion 01/06/2016  . Tobacco abuse counseling 10/09/2014  . Type 2 diabetes mellitus with insulin therapy ([place])   . Wears dentures    upper   Past Surgical History:  Procedure Laterality Date  . CATARACT EXTRACTION, BILATERAL  08/2012  . PILONIDAL CYST EXCISION N/A 10/17/2015   Procedure: EXCISION PILONIDAL DISEASE;  Surgeon: Leighton Ruff, MD;  [facility];  Service: General;  Laterality: N/A;  . PORTACATH PLACEMENT  01/30/2015  . RETINAL DETACHMENT SURGERY Bilateral 04/2012     Allergies: Patient has no known allergies.  Medications: Prior to Admission medications   Medication Sig Start Date End Date Taking? Authorizing Provider  amLODipine (NORVASC) 10 MG tablet Take 1 tablet (10 mg total) by mouth daily. 11/19/15  Yes Barton Dubois, MD  aspirin EC 81 MG tablet Take 1 tablet by mouth every morning.    Yes [provider]  Cholecalciferol (VITAMIN D-3) 1000 units CAPS Take 1,000 Units by mouth daily.   Yes [provider]  docusate sodium (COLACE) 100 MG capsule Take 1 capsule (100 mg total) by mouth 2 (two) times daily. 11/19/15  Yes Barton Dubois, MD  FeFum-FePoly-FA-B Cmp-C-Biot (INTEGRA PLUS) CAPS Take 1 capsule by mouth every morning. 07/26/16  Yes Curt Bears, MD  ferrous sulfate 325 (65 FE) MG tablet Take by mouth.   Yes [provider]  insulin NPH-regular Human (NOVOLIN 70/30) (70-30) 100 UNIT/ML injection Inject 35 Units into the skin 2 (two) times daily with a meal. Patient taking differently: Inject 20 Units into the skin 2 (two) times daily with a meal. ONLY IF CBG IS OVER 200. 01/23/14  Yes Advani, Deepak, MD    lactulose (CHRONULAC) 10 GM/15ML solution TAKE 30 MLS BY MOUTH 2 (TWO) TIMES DAILY. 07/28/16  Yes Curt Bears, MD  nicotine (NICODERM CQ) 14 mg/24hr patch Place 1 patch (14 mg total) onto the skin daily. 07/28/16  Yes Curt Bears, MD  ondansetron (ZOFRAN) 8 MG tablet TAKE 1 TABLET EVERY 8 HOURS AS NEEDED FOR NAUSEA AND VOMITING 06/30/16  Yes Curt Bears, MD  oxyCODONE (OXY IR/ROXICODONE) 5 MG immediate release tablet Take 1 tablet (5 mg total) by mouth every 6 (six) hours as needed for severe pain. 07/29/16 08/28/16 Yes Curt Bears, MD  senna (SENOKOT) 8.6 MG tablet Take by mouth. 06/15/16 06/15/17 Yes [provider]  simvastatin (ZOCOR) 40 MG tablet Take 1 tablet (40 mg total) by mouth daily. Patient taking differently: Take 40 mg by mouth every morning.  01/23/14  Yes Advani, Vernon Prey, MD  vitamin C (ASCORBIC ACID) 500 MG tablet Take 500 mg by mouth 2 (two) times daily.    Yes [provider]  acetaminophen (TYLENOL) 325 MG tablet Take 2 tablets (650 mg total) by mouth every 6 (six) hours as needed for mild pain (or Fever >/= 101). 10/02/15   Lavina Hamman, MD  benzonatate (TESSALON) 100 MG capsule Take 1 capsule (100 mg total) by mouth 3 (three) times daily as needed for cough. 03/30/16   Curt Bears, MD  lidocaine-prilocaine (EMLA) cream APPLY 1 APPLICATION TOPICALLY AS NEEDED. 02/17/16   Curt Bears, MD  temazepam (RESTORIL) 30 MG capsule TAKE ONE CAPSULE AT BEDTIME 06/19/15   Curt Bears, MD     Vital Signs: BP 107/72 (BP Location: Right Arm)   Pulse (!) 108   Temp 98 F (36.7 C) (Oral)   Resp 18   SpO2 94%   Physical Exam Awake, alert. Chest with slightly diminished breath sounds bases; Clean, intact right chest wall Port-A-Cath.Heart with tachycardic but regular rhythm. Abdomen soft, positive bowel sounds, not significantly tender to palpation; no lower extremity edema  Imaging: No results found.  Labs:  CBC:  Recent Labs   04/27/16 0857 05/11/16 0852 06/30/16 0918 07/28/16 1323  WBC 10.7* 9.3 10.5* 16.5*  HGB 9.2* 9.0* 9.0* 10.0*  HCT 28.8* 27.7* 27.5* 30.8*  PLT 355 338 348 336    COAGS:  Recent Labs  09/30/15 1922  INR 1.16  APTT 34    BMP:  Recent Labs  10/01/15 1545 10/02/15 0425  10/17/15 1040  11/18/15 1710 11/19/15 0448  04/27/16 0857 05/11/16 0852 06/30/16 0919 07/28/16 1323  NA 138 140  < > 145  < >  --  139  < > 139 137 139 137  K 4.7 5.1  < > 4.3  < >  --  4.4  < > 4.8 5.2* 4.3 4.7  CL 115* 116*  --  109  --   --  111  --   --   --   --   --   CO2 21* 21*  < >  --   < >  --  24  < > 26 26 25 27   GLUCOSE 114* 94  < > 95  < >  --  168*  < > 221* 156* 128 171*  BUN 19 22*  < > 15  < >  --  25*  < > 21.2 28.8* 20.3 20.9  CALCIUM 8.4* 8.3*  < >  --   < >  --  8.8*  < > 9.6 9.9 10.0 10.0  CREATININE 1.34* 1.41*  < > 1.20  < > 1.83* 1.61*  < > 1.8* 1.9* 1.6* 1.7*  GFRNONAA 58* 55*  --   --   --  40* 47*  --   --   --   --   --   GFRAA >60 >60  --   --   --  46* 54*  --   --   --   --   --   < > = values in this interval not displayed.  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 Recent imaging has revealed progressive hepatic disease. He presents today for image guided liver lesion biopsy for molecular studies.Risks and benefits discussed with the patient/family including, but not limited to bleeding, infection, damage to adjacent structures or low yield requiring additional tests.All of the patient's questions were answered, patient is agreeable to proceed. Consent signed and in chart.     Electronically Signed: D. Rowe Davyn,  PA-C 08/05/2016, 11:25 AM   I spent a total of 20 minutes at the the patient's bedside AND on the patient's hospital floor or unit, greater than 50% of which was counseling/coordinating care for image guided liver lesion biopsy

## 2016-08-05 NOTE — Discharge Instructions (Signed)
Moderate Conscious Sedation, Adult, Care After °These instructions provide you with information about caring for yourself after your procedure. Your health care provider may also give you more specific instructions. Your treatment has been planned according to current medical practices, but problems sometimes occur. Call your health care provider if you have any problems or questions after your procedure. °What can I expect after the procedure? °After your procedure, it is common: °· To feel sleepy for several hours. °· To feel clumsy and have poor balance for several hours. °· To have poor judgment for several hours. °· To vomit if you eat too soon. ° °Follow these instructions at home: °For at least 24 hours after the procedure: ° °· Do not: °? Participate in activities where you could fall or become injured. °? Drive. °? Use heavy machinery. °? Drink alcohol. °? Take sleeping pills or medicines that cause drowsiness. °? Make important decisions or sign legal documents. °? Take care of children on your own. °· Rest. °Eating and drinking °· Follow the diet recommended by your health care provider. °· If you vomit: °? Drink water, juice, or soup when you can drink without vomiting. °? Make sure you have little or no nausea before eating solid foods. °General instructions °· Have a responsible adult stay with you until you are awake and alert. °· Take over-the-counter and prescription medicines only as told by your health care provider. °· If you smoke, do not smoke without supervision. °· Keep all follow-up visits as told by your health care provider. This is important. °Contact a health care provider if: °· You keep feeling nauseous or you keep vomiting. °· You feel light-headed. °· You develop a rash. °· You have a fever. °Get help right away if: °· You have trouble breathing. °This information is not intended to replace advice given to you by your health care provider. Make sure you discuss any questions you have  with your health care provider. °Document Released: 11/22/2012 Document Revised: 07/07/2015 Document Reviewed: 05/24/2015 °Elsevier Interactive Patient Education © 2018 [employer] ° ° °Liver Biopsy, Care After °These instructions give you information on caring for yourself after your procedure. Your doctor may also give you more specific instructions. Call your doctor if you have any problems or questions after your procedure. °Follow these instructions at home: °· Rest at home for 1-2 days or as told by your doctor. °· Have someone stay with you for at least 24 hours. °· Do not do these things in the first 24 hours: °? Drive. °? Use machinery. °? Take care of other people. °? Sign legal documents. °? Take a bath or shower. °· There are many different ways to close and cover a cut (incision). For example, a cut can be closed with stitches, skin glue, or adhesive strips. Follow your doctor's instructions on: °? Taking care of your cut. °? Changing and removing your bandage (dressing). °? Removing whatever was used to close your cut. °· Do not drink alcohol in the first week. °· Do not lift more than 5 pounds or play contact sports for the first 2 weeks. °· Take medicines only as told by your doctor. For 1 week, do not take medicine that has aspirin in it or medicines like ibuprofen. °· Get your test results. °Contact a doctor if: °· A cut bleeds and leaves more than just a small spot of blood. °· A cut is red, puffs up (swells), or hurts more than before. °· Fluid or something else   comes from a cut. °· A cut smells bad. °· You have a fever or chills. °Get help right away if: °· You have swelling, bloating, or pain in your belly (abdomen). °· You get dizzy or faint. °· You have a rash. °· You feel sick to your stomach (nauseous) or throw up (vomit). °· You have trouble breathing, feel short of breath, or feel faint. °· Your chest hurts. °· You have problems talking or seeing. °· You have trouble balancing or moving  your arms or legs. °This information is not intended to replace advice given to you by your health care provider. Make sure you discuss any questions you have with your health care provider. °Document Released: 11/11/2007 Document Revised: 07/10/2015 Document Reviewed: 03/30/2013 °Elsevier Interactive Patient Education © 2018 [employer] ° ° °

## 2016-08-05 NOTE — Procedures (Signed)
  [place] core bx liver lesion 38F x3  No complication No blood loss. See complete dictation in [facility].  Dillard Cannon MD Main # [phone] Pager  [phone]

## 2016-08-05 NOTE — Telephone Encounter (Signed)
Pt wife called request refill on oxy and 14 day supply. Reviewed with MD ok to refill.

## 2016-08-06 ENCOUNTER — Inpatient Hospital Stay (HOSPITAL_COMMUNITY)
Admission: EM | Admit: 2016-08-06 | Discharge: 2016-08-08 | DRG: 683 | Disposition: A | Discharge status: Home Health | Payer: Medicare Other | Attending: Nephrology | Admitting: Nephrology

## 2016-08-06 ENCOUNTER — Encounter: Payer: Self-pay | Admitting: *Deleted

## 2016-08-06 ENCOUNTER — Telehealth: Payer: Self-pay | Admitting: *Deleted

## 2016-08-06 ENCOUNTER — Encounter (HOSPITAL_COMMUNITY): Payer: Self-pay

## 2016-08-06 DIAGNOSIS — D649 Anemia, unspecified: Secondary | ICD-10-CM | POA: Diagnosis not present

## 2016-08-06 DIAGNOSIS — I129 Hypertensive chronic kidney disease with stage 1 through stage 4 chronic kidney disease, or unspecified chronic kidney disease: Secondary | ICD-10-CM | POA: Diagnosis present

## 2016-08-06 DIAGNOSIS — N184 Chronic kidney disease, stage 4 (severe): Secondary | ICD-10-CM | POA: Diagnosis present

## 2016-08-06 DIAGNOSIS — N179 Acute kidney failure, unspecified: Secondary | ICD-10-CM | POA: Diagnosis present

## 2016-08-06 DIAGNOSIS — D509 Iron deficiency anemia, unspecified: Secondary | ICD-10-CM | POA: Diagnosis present

## 2016-08-06 DIAGNOSIS — E119 Type 2 diabetes mellitus without complications: Secondary | ICD-10-CM

## 2016-08-06 DIAGNOSIS — I10 Essential (primary) hypertension: Secondary | ICD-10-CM | POA: Diagnosis present

## 2016-08-06 DIAGNOSIS — Z9221 Personal history of antineoplastic chemotherapy: Secondary | ICD-10-CM

## 2016-08-06 DIAGNOSIS — N19 Unspecified kidney failure: Secondary | ICD-10-CM

## 2016-08-06 DIAGNOSIS — E1122 Type 2 diabetes mellitus with diabetic chronic kidney disease: Secondary | ICD-10-CM | POA: Diagnosis present

## 2016-08-06 DIAGNOSIS — Z87891 Personal history of nicotine dependence: Secondary | ICD-10-CM | POA: Diagnosis not present

## 2016-08-06 DIAGNOSIS — E1142 Type 2 diabetes mellitus with diabetic polyneuropathy: Secondary | ICD-10-CM | POA: Diagnosis present

## 2016-08-06 DIAGNOSIS — E785 Hyperlipidemia, unspecified: Secondary | ICD-10-CM | POA: Diagnosis present

## 2016-08-06 DIAGNOSIS — Z79899 Other long term (current) drug therapy: Secondary | ICD-10-CM | POA: Diagnosis not present

## 2016-08-06 DIAGNOSIS — F1010 Alcohol abuse, uncomplicated: Secondary | ICD-10-CM | POA: Diagnosis present

## 2016-08-06 DIAGNOSIS — E875 Hyperkalemia: Secondary | ICD-10-CM | POA: Diagnosis present

## 2016-08-06 DIAGNOSIS — Z7982 Long term (current) use of aspirin: Secondary | ICD-10-CM | POA: Diagnosis not present

## 2016-08-06 DIAGNOSIS — H548 Legal blindness, as defined in USA: Secondary | ICD-10-CM | POA: Diagnosis present

## 2016-08-06 DIAGNOSIS — Z85118 Personal history of other malignant neoplasm of bronchus and lung: Secondary | ICD-10-CM | POA: Diagnosis not present

## 2016-08-06 DIAGNOSIS — Z794 Long term (current) use of insulin: Secondary | ICD-10-CM | POA: Diagnosis not present

## 2016-08-06 DIAGNOSIS — M1990 Unspecified osteoarthritis, unspecified site: Secondary | ICD-10-CM | POA: Diagnosis present

## 2016-08-06 DIAGNOSIS — N189 Chronic kidney disease, unspecified: Secondary | ICD-10-CM

## 2016-08-06 DIAGNOSIS — F17210 Nicotine dependence, cigarettes, uncomplicated: Secondary | ICD-10-CM | POA: Diagnosis present

## 2016-08-06 DIAGNOSIS — C787 Secondary malignant neoplasm of liver and intrahepatic bile duct: Secondary | ICD-10-CM | POA: Diagnosis present

## 2016-08-06 DIAGNOSIS — D72829 Elevated white blood cell count, unspecified: Secondary | ICD-10-CM | POA: Diagnosis present

## 2016-08-06 DIAGNOSIS — C3492 Malignant neoplasm of unspecified part of left bronchus or lung: Secondary | ICD-10-CM | POA: Diagnosis present

## 2016-08-06 DIAGNOSIS — Z9981 Dependence on supplemental oxygen: Secondary | ICD-10-CM | POA: Diagnosis not present

## 2016-08-06 DIAGNOSIS — F329 Major depressive disorder, single episode, unspecified: Secondary | ICD-10-CM | POA: Diagnosis present

## 2016-08-06 DIAGNOSIS — Z720 Tobacco use: Secondary | ICD-10-CM | POA: Diagnosis present

## 2016-08-06 DIAGNOSIS — E139 Other specified diabetes mellitus without complications: Secondary | ICD-10-CM

## 2016-08-06 DIAGNOSIS — N139 Obstructive and reflux uropathy, unspecified: Secondary | ICD-10-CM | POA: Diagnosis present

## 2016-08-06 LAB — NA AND K (SODIUM & POTASSIUM), RAND UR
Potassium Urine: 65 mmol/L
Sodium, Ur: 38 mmol/L

## 2016-08-06 LAB — CBC WITH DIFFERENTIAL/PLATELET
Band Neutrophils: 0 %
Basophils Absolute: 0.3 10*3/uL — ABNORMAL HIGH (ref 0.0–0.1)
Basophils Relative: 1 %
Blasts: 0 %
Eosinophils Absolute: 0 10*3/uL (ref 0.0–0.7)
Eosinophils Relative: 0 %
HCT: 22.4 % — ABNORMAL LOW (ref 39.0–52.0)
Hemoglobin: 7.4 g/dL — ABNORMAL LOW (ref 13.0–17.0)
Lymphocytes Relative: 9 %
Lymphs Abs: 2.3 10*3/uL (ref 0.7–4.0)
MCH: 20.6 pg — ABNORMAL LOW (ref 26.0–34.0)
MCHC: 33 g/dL (ref 30.0–36.0)
MCV: 62.2 fL — ABNORMAL LOW (ref 78.0–100.0)
Metamyelocytes Relative: 1 %
Monocytes Absolute: 0.8 10*3/uL (ref 0.1–1.0)
Monocytes Relative: 3 %
Myelocytes: 0 %
Neutro Abs: 22.3 10*3/uL — ABNORMAL HIGH (ref 1.7–7.7)
Neutrophils Relative %: 86 %
Other: 0 %
Platelets: 299 10*3/uL (ref 150–400)
Promyelocytes Absolute: 0 %
RBC: 3.6 MIL/uL — ABNORMAL LOW (ref 4.22–5.81)
RDW: 21.1 % — ABNORMAL HIGH (ref 11.5–15.5)
WBC: 25.7 10*3/uL — ABNORMAL HIGH (ref 4.0–10.5)
nRBC: 0 /100 WBC

## 2016-08-06 LAB — URINALYSIS, ROUTINE W REFLEX MICROSCOPIC
BACTERIA UA: NONE SEEN
BILIRUBIN URINE: NEGATIVE
GLUCOSE, UA: NEGATIVE mg/dL
Hgb urine dipstick: NEGATIVE
KETONES UR: NEGATIVE mg/dL
Leukocytes, UA: NEGATIVE
NITRITE: NEGATIVE
PROTEIN: 100 mg/dL — AB
Specific Gravity, Urine: 1.017 (ref 1.005–1.030)
pH: 5 (ref 5.0–8.0)

## 2016-08-06 LAB — HEMOGLOBIN AND HEMATOCRIT, BLOOD
HCT: 21.5 % — ABNORMAL LOW (ref 39.0–52.0)
Hemoglobin: 7.2 g/dL — ABNORMAL LOW (ref 13.0–17.0)

## 2016-08-06 LAB — BASIC METABOLIC PANEL
Anion gap: 9 (ref 5–15)
Anion gap: 6 (ref 5–15)
BUN: 36 mg/dL — ABNORMAL HIGH (ref 6–20)
BUN: 30 mg/dL — ABNORMAL HIGH (ref 6–20)
CO2: 25 mmol/L (ref 22–32)
CO2: 26 mmol/L (ref 22–32)
Calcium: 9.2 mg/dL (ref 8.9–10.3)
Calcium: 8.5 mg/dL — ABNORMAL LOW (ref 8.9–10.3)
Chloride: 101 mmol/L (ref 101–111)
Chloride: 104 mmol/L (ref 101–111)
Creatinine, Ser: 2.93 mg/dL — ABNORMAL HIGH (ref 0.61–1.24)
Creatinine, Ser: 2.21 mg/dL — ABNORMAL HIGH (ref 0.61–1.24)
GFR calc Af Amer: 26 mL/min — ABNORMAL LOW (ref >60)
GFR calc Af Amer: 37 mL/min — ABNORMAL LOW (ref >60)
GFR calc non Af Amer: 22 mL/min — ABNORMAL LOW (ref >60)
GFR calc non Af Amer: 32 mL/min — ABNORMAL LOW (ref >60)
Glucose, Bld: 183 mg/dL — ABNORMAL HIGH (ref 65–99)
Glucose, Bld: 201 mg/dL — ABNORMAL HIGH (ref 65–99)
Potassium: 5.3 mmol/L — ABNORMAL HIGH (ref 3.5–5.1)
Potassium: 4.6 mmol/L (ref 3.5–5.1)
Sodium: 135 mmol/L (ref 135–145)
Sodium: 136 mmol/L (ref 135–145)

## 2016-08-06 LAB — GLUCOSE, CAPILLARY: GLUCOSE-CAPILLARY: 146 mg/dL — AB (ref 65–99)

## 2016-08-06 LAB — CREATININE, URINE, RANDOM: Creatinine, Urine: 270.11 mg/dL

## 2016-08-06 LAB — CK: Total CK: 96 U/L (ref 49–397)

## 2016-08-06 MED ORDER — NICOTINE 14 MG/24HR TD PT24
14.0000 mg | MEDICATED_PATCH | Freq: Every day | TRANSDERMAL | Status: DC
  Administered 2016-08-06 – 2016-08-08 (×3): 14 mg via TRANSDERMAL
  Filled 2016-08-06 (×3): qty 1

## 2016-08-06 MED ORDER — ONDANSETRON HCL 4 MG/2ML IJ SOLN
4.0000 mg | Freq: Four times a day (QID) | INTRAMUSCULAR | Status: DC | PRN
  Administered 2016-08-07: 4 mg via INTRAVENOUS
  Filled 2016-08-06: qty 2

## 2016-08-06 MED ORDER — AMLODIPINE BESYLATE 10 MG PO TABS
10.0000 mg | ORAL_TABLET | Freq: Every day | ORAL | Status: DC
  Administered 2016-08-07 – 2016-08-08 (×2): 10 mg via ORAL
  Filled 2016-08-06 (×2): qty 1

## 2016-08-06 MED ORDER — SODIUM CHLORIDE 0.9 % IV BOLUS (SEPSIS)
1000.0000 mL | Freq: Once | INTRAVENOUS | Status: AC
  Administered 2016-08-06: 1000 mL via INTRAVENOUS

## 2016-08-06 MED ORDER — MORPHINE SULFATE ER 30 MG PO TBCR
30.0000 mg | EXTENDED_RELEASE_TABLET | Freq: Three times a day (TID) | ORAL | Status: DC
  Administered 2016-08-06 – 2016-08-08 (×6): 30 mg via ORAL
  Filled 2016-08-06 (×6): qty 1

## 2016-08-06 MED ORDER — SODIUM CHLORIDE 0.9 % IV SOLN
INTRAVENOUS | Status: DC
  Administered 2016-08-06 – 2016-08-07 (×2): via INTRAVENOUS

## 2016-08-06 MED ORDER — LACTULOSE 10 GM/15ML PO SOLN
10.0000 g | Freq: Three times a day (TID) | ORAL | Status: DC
  Administered 2016-08-06 – 2016-08-08 (×5): 10 g via ORAL
  Filled 2016-08-06 (×5): qty 15

## 2016-08-06 MED ORDER — SODIUM CHLORIDE 0.9% FLUSH
10.0000 mL | INTRAVENOUS | Status: DC | PRN
  Administered 2016-08-06 – 2016-08-07 (×2): 10 mL
  Filled 2016-08-06 (×2): qty 40

## 2016-08-06 MED ORDER — MILK AND MOLASSES ENEMA
1.0000 | Freq: Once | RECTAL | Status: AC
  Administered 2016-08-06: 250 mL via RECTAL
  Filled 2016-08-06: qty 250

## 2016-08-06 MED ORDER — ASPIRIN EC 81 MG PO TBEC
81.0000 mg | DELAYED_RELEASE_TABLET | Freq: Every morning | ORAL | Status: DC
  Administered 2016-08-07 – 2016-08-08 (×2): 81 mg via ORAL
  Filled 2016-08-06 (×2): qty 1

## 2016-08-06 MED ORDER — DOCUSATE SODIUM 100 MG PO CAPS
100.0000 mg | ORAL_CAPSULE | Freq: Two times a day (BID) | ORAL | Status: DC
  Administered 2016-08-06 – 2016-08-08 (×4): 100 mg via ORAL
  Filled 2016-08-06 (×4): qty 1

## 2016-08-06 MED ORDER — SIMVASTATIN 40 MG PO TABS: 40.0000 mg | ORAL_TABLET | Freq: Every morning | ORAL | Status: DC

## 2016-08-06 MED ORDER — LIDOCAINE-PRILOCAINE 2.5-2.5 % EX CREA: 1.0000 "application " | TOPICAL_CREAM | CUTANEOUS | Status: DC | PRN

## 2016-08-06 MED ORDER — ADULT MULTIVITAMIN W/MINERALS CH
1.0000 | ORAL_TABLET | Freq: Every day | ORAL | Status: DC
  Administered 2016-08-07 – 2016-08-08 (×2): 1 via ORAL
  Filled 2016-08-06 (×2): qty 1

## 2016-08-06 MED ORDER — VITAMIN D 1000 UNITS PO TABS
1000.0000 [IU] | ORAL_TABLET | Freq: Every day | ORAL | Status: DC
  Administered 2016-08-07 – 2016-08-08 (×2): 1000 [IU] via ORAL
  Filled 2016-08-06 (×2): qty 1

## 2016-08-06 MED ORDER — OXYCODONE HCL 5 MG PO TABS
5.0000 mg | ORAL_TABLET | Freq: Four times a day (QID) | ORAL | Status: DC | PRN
Start: 2016-08-06 — End: 2016-08-08
  Administered 2016-08-06: 5 mg via ORAL
  Filled 2016-08-06: qty 1

## 2016-08-06 MED ORDER — MILK AND MOLASSES ENEMA
1.0000 | Freq: Once | RECTAL | Status: DC
  Filled 2016-08-06: qty 250

## 2016-08-06 MED ORDER — INTEGRA PLUS PO CAPS: 1.0000 | ORAL_CAPSULE | Freq: Every morning | ORAL | Status: DC

## 2016-08-06 MED ORDER — ONDANSETRON HCL 4 MG PO TABS: 4.0000 mg | ORAL_TABLET | Freq: Four times a day (QID) | ORAL | Status: DC | PRN

## 2016-08-06 MED ORDER — ATORVASTATIN CALCIUM 20 MG PO TABS
20.0000 mg | ORAL_TABLET | Freq: Every day | ORAL | Status: DC
  Administered 2016-08-07 – 2016-08-08 (×2): 20 mg via ORAL
  Filled 2016-08-06 (×2): qty 1

## 2016-08-06 MED ORDER — INSULIN ASPART 100 UNIT/ML ~~LOC~~ SOLN
0.0000 [IU] | Freq: Three times a day (TID) | SUBCUTANEOUS | Status: DC
  Administered 2016-08-07: 2 [IU] via SUBCUTANEOUS

## 2016-08-06 NOTE — H&P (Signed)
History and Physical    Keith Holloway MLY:[id number] DOB: 28-Oct-1960 DOA: 08/06/2016  PCP: Keith Mariscal, MD   Patient coming from: Home.  I have personally briefly reviewed patient's old medical records in [place]  Chief Complaint: Unable to urinate since yesterday.  HPI: Keith Holloway is a 56 y.o. 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 He states that he has not been drinking or eating much at home with the exception of 2 cans of ensure. He denies fever, chills, worsening chronic dyspnea, chest pain, nausea, vomiting, diarrhea, melena or hematochezia. He denies dysuria, frequency or gross hematuria prior to anuria.  ED Course: The patient received 2 L of normal saline bolus. His vital signs are temperature 98.29F, pulse ranged from 76 to 119, respirations 16, blood pressure 108/74 and oxygen saturation of 97% on room air. His WBC was 25.7, hemoglobin 7.4 g/dL and platelets 299. His WBC was 20.4 and hemoglobin 9.3 g/dL yesterday. Sodium 135, potassium 5.3, chloride 101, bicarbonate 25 mmol/L. BUN was 36, creatinine 2.93 and glucose 183 mg/dL. His creatinine was 1.86 mg/dL yesterday.  Review of Systems: As per HPI otherwise 10 point review of systems negative.    Past Medical History:  Diagnosis Date  . Acute hyperglycemia 11/26/2015  . Alcohol abuse   . Arthritis   . Blind   . Borderline hypertension   . CKD (chronic kidney disease), stage III   . Cocaine abuse    last [employer] post[employer] for cocaine 09-30-2015  . Depression   . Dyspnea    with exertion   . Dyspnea on effort   . Elevated  blood sugar 11/26/2015  . Goals of care, counseling/discussion 03/09/2016  . History of acute renal failure    09-30-2015  . History of blood transfusion   . History of hyperkalemia    admission 09-30-2015 potassiom 8.0  with medications currently 10-14-2015 down to 4.7 normal  . History of traumatic head injury    1987  w/ LOC   --- no residual  . Hyperglycemia 02/17/2016  . Hyperlipidemia   . Hypertension 12/16/2015  . 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 Memory disorder 08/22/2013   alcohol abuse  . Migraine   . Nocturia   . 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 Pilonidal abscess    recurrent  . Pleural effusion, bilateral    per ct 10-09-2015  new small right effusion and left small to moderate effusion , increased  . Polyneuropathy in diabetes(357.2)   . Port-a-cath in place   . Pulmonary nodules/lesions, multiple    pt has stage 4 lung cancer  . Recurrent left pleural effusion 01/06/2016  . Tobacco abuse counseling 10/09/2014  . Type 2 diabetes mellitus with insulin therapy ([place])   . Wears dentures    upper    Past Surgical History:  Procedure Laterality Date  .  CATARACT EXTRACTION, BILATERAL  08/2012  . PILONIDAL CYST EXCISION N/A 10/17/2015   Procedure: EXCISION PILONIDAL DISEASE;  Surgeon: Leighton Ruff, MD;  Location: [facility];  Service: General;  Laterality: N/A;  . PORTACATH PLACEMENT  01/30/2015  . RETINAL DETACHMENT SURGERY Bilateral 04/2012     reports that he quit smoking about 10 months ago. His smoking use included Cigarettes. He has a 8.00 pack-year smoking history. He has never used smokeless tobacco. He reports that he drinks alcohol.  He reports that he uses drugs, including Cocaine.  No Known Allergies  Family History  Problem Relation Age of Onset  . Alzheimer's disease Brother   . Cancer Father   . Hypertension Father   . Heart attack Father   . Hypertension Mother   . Gout Mother   . Diabetes Sister   . Hypertension Sister   . Hypertension Sister   . Hypertension Sister     Prior to Admission medications   Medication Sig Start Date End Date Taking? Authorizing Provider  amLODipine (NORVASC) 10 MG tablet Take 1 tablet (10 mg total) by mouth daily. 11/19/15  Yes Barton Dubois, MD  aspirin EC 81 MG tablet Take 1 tablet by mouth every morning.    Yes [provider]  Cholecalciferol (VITAMIN D-3) 1000 units CAPS Take 1,000 Units by mouth daily.   Yes [provider]  docusate sodium (COLACE) 100 MG capsule Take 1 capsule (100 mg total) by mouth 2 (two) times daily. 11/19/15  Yes Barton Dubois, MD  FeFum-FePoly-FA-B Cmp-C-Biot (INTEGRA PLUS) CAPS Take 1 capsule by mouth every morning. 07/26/16  Yes Curt Bears, MD  ferrous sulfate 325 (65 FE) MG tablet Take 325 mg by mouth daily with breakfast.    Yes [provider]  insulin NPH-regular Human (NOVOLIN 70/30) (70-30) 100 UNIT/ML injection Inject 35 Units into the skin 2 (two) times daily with a meal. 01/23/14  Yes Advani, Deepak, MD  lactulose (CHRONULAC) 10 GM/15ML solution TAKE 30 MLS TWICE A DAY 08/05/16  Yes Curt Bears, MD  lidocaine-prilocaine (EMLA) cream APPLY 1 APPLICATION TOPICALLY AS NEEDED. 02/17/16  Yes Curt Bears, MD  morphine (MS CONTIN) 30 MG 12 hr tablet Take 30 mg by mouth every 8 (eight) hours as needed for severe pain. 07/05/16  Yes [provider]  nicotine (NICODERM CQ) 14 mg/24hr patch Place 1 patch (14 mg total) onto the skin daily. 07/28/16  Yes Curt Bears, MD  ondansetron (ZOFRAN) 8 MG tablet TAKE 1 TABLET EVERY 8 HOURS AS NEEDED FOR NAUSEA AND VOMITING 06/30/16  Yes Curt Bears, MD    oxyCODONE (OXY IR/ROXICODONE) 5 MG immediate release tablet Take 1 tablet (5 mg total) by mouth every 6 (six) hours as needed for severe pain. 08/05/16 10/04/16 Yes Curt Bears, MD  simvastatin (ZOCOR) 40 MG tablet Take 1 tablet (40 mg total) by mouth daily. Patient taking differently: Take 40 mg by mouth every morning.  01/23/14  Yes Advani, Vernon Prey, MD  vitamin C (ASCORBIC ACID) 500 MG tablet Take 500 mg by mouth 2 (two) times daily.    Yes [provider]  acetaminophen (TYLENOL) 325 MG tablet Take 2 tablets (650 mg total) by mouth every 6 (six) hours as needed for mild pain (or Fever >/= 101). Patient not taking: Reported on 08/06/2016 10/02/15   Lavina Hamman, MD  benzonatate (TESSALON) 100 MG capsule Take 1 capsule (100 mg total) by mouth 3 (three) times daily as needed for cough. Patient not taking: Reported on  08/06/2016 03/30/16   Curt Bears, MD  temazepam (RESTORIL) 30 MG capsule TAKE ONE CAPSULE AT BEDTIME Patient not taking: Reported on 08/06/2016 06/19/15   Curt Bears, MD    Physical Exam: Vitals:   08/06/16 1346 08/06/16 1348 08/06/16 1731  BP:  108/74 131/88  Pulse:  76 (!) 119  Resp:  16 16  Temp: 98.3 F (36.8 C) 98.3 F (36.8 C)   TempSrc: Oral Oral   SpO2:  97% 94%    Constitutional: NAD, calm, comfortable Eyes: PERRL, lids and conjunctivae normal ENMT: Mucous membranes are dry. Posterior pharynx clear of any exudate or lesions. Partially absent dentition. Neck: normal, supple, no masses, no thyromegaly Respiratory: Clear to auscultation bilaterally, no wheezing, no crackles. Normal respiratory effort. No accessory muscle use.  Cardiovascular: Regular rate and rhythm, no murmurs / rubs / gallops. No extremity edema. 2+ pedal pulses. No carotid bruits.  Abdomen: Mild RUQ tenderness, no guarding/rebound/masses palpated. No hepatosplenomegaly. Bowel sounds positive.  Musculoskeletal: no clubbing / cyanosis. Good ROM, no contractures. Normal muscle  tone.  Skin: no rashes, lesions, ulcers on limited skin exam. Neurologic: CN 2-12 grossly intact. Sensation intact, DTR normal. Strength 5/5 in all 4.  Psychiatric: Normal judgment and insight. Alert and oriented x 3. Normal mood.    Labs on Admission: I have personally reviewed following labs and imaging studies  CBC:  Recent Labs Lab 08/05/16 1207 08/06/16 1527  WBC 20.4* 25.7*  NEUTROABS 17.0* 22.3*  HGB 9.3* 7.4*  HCT 27.4* 22.4*  MCV 61.6* 62.2*  PLT 312 597   Basic Metabolic Panel:  Recent Labs Lab 08/05/16 1207 08/06/16 1527  NA 136 135  K 4.6 5.3*  CL 100* 101  CO2 27 25  GLUCOSE 132* 183*  BUN 22* 36*  CREATININE 1.86* 2.93*  CALCIUM 9.6 9.2   GFR: Estimated Creatinine Clearance: 28.6 mL/min (A) (by C-G formula based on SCr of 2.93 mg/dL (H)). Liver Function Tests:  Recent Labs Lab 08/05/16 1207  AST 116*  ALT 48  ALKPHOS 266*  BILITOT 0.5  PROT 8.1  ALBUMIN 3.3*   No results for input(s): LIPASE, AMYLASE in the last 168 hours. No results for input(s): AMMONIA in the last 168 hours. Coagulation Profile:  Recent Labs Lab 08/05/16 1207  INR 1.18   Cardiac Enzymes: No results for input(s): CKTOTAL, CKMB, CKMBINDEX, TROPONINI in the last 168 hours. BNP (last 3 results) No results for input(s): PROBNP in the last 8760 hours. HbA1C: No results for input(s): HGBA1C in the last 72 hours. CBG:  Recent Labs Lab 08/05/16 1114  GLUCAP 167*   Lipid Profile: No results for input(s): CHOL, HDL, LDLCALC, TRIG, CHOLHDL, LDLDIRECT in the last 72 hours. Thyroid Function Tests: No results for input(s): TSH, T4TOTAL, FREET4, T3FREE, THYROIDAB in the last 72 hours. Anemia Panel: No results for input(s): VITAMINB12, FOLATE, FERRITIN, TIBC, IRON, RETICCTPCT in the last 72 hours. Urine analysis:    Component Value Date/Time   COLORURINE AMBER (A) 08/06/2016 1349   APPEARANCEUR HAZY (A) 08/06/2016 1349   LABSPEC 1.017 08/06/2016 1349   PHURINE 5.0  08/06/2016 1349   GLUCOSEU NEGATIVE 08/06/2016 1349   HGBUR NEGATIVE 08/06/2016 1349   BILIRUBINUR NEGATIVE 08/06/2016 1349   KETONESUR NEGATIVE 08/06/2016 1349   PROTEINUR 100 (A) 08/06/2016 1349   UROBILINOGEN 0.2 05/12/2010 1656   NITRITE NEGATIVE 08/06/2016 1349   LEUKOCYTESUR NEGATIVE 08/06/2016 1349    Radiological Exams on Admission: US Biopsy  Result Date: 08/05/2016 CLINICAL DATA:  Lung carcinoma. Progressive  liver lesions consistent metastatic disease. EXAM: ULTRASOUND GUIDED CORE BIOPSY OF LIVER LESION MEDICATIONS: Intravenous Fentanyl and Versed were administered as conscious sedation during continuous monitoring of the patient's level of consciousness and physiological / cardiorespiratory status by the radiology RN, with a total moderate sedation time of 13 minutes. PROCEDURE: The procedure, risks, benefits, and alternatives were explained to the patient. Questions regarding the procedure were encouraged and answered. The patient understands and consents to the procedure. . Survey ultrasound of the liver was performed. A representative lesion in the left lobe was localized and an appropriate skin entry site was determined and marked. The operative field was prepped with chlorhexidine in a sterile fashion, and a sterile drape was applied covering the operative field. A sterile gown and sterile gloves were used for the procedure. Local anesthesia was provided with 1% Lidocaine. Under real-time ultrasound guidance, a 17 gauge trocar needle was advanced to the margin of the lesion. Once needle tip position was confirmed, coaxial 18-gauge core biopsy samples were obtained, submitted in formalin to surgical pathology. The guide needle was removed. Postprocedure scans show no hemorrhage or other apparent complication. The patient tolerated the procedure well. COMPLICATIONS: None. FINDINGS: Multiple liver lesions are identified corresponding to findings on recent CT. Representative core biopsy  samples of a left lobe lesion obtained as above. IMPRESSION: 1. Technically successful CT-guided core biopsy, left liver lesion. Electronically Signed   By: Lucrezia Europe M.D.   On: 08/05/2016 14:00    EKG: Independently reviewed.   Assessment/Plan Principal Problem:   AKI (acute kidney injury) ([place]) Admit to telemetry/inpatient. Continue IV hydration. Check urine creatinine and sodium. Follow-up renal function and electrolytes in a.m. Consider further workup or nephrology evaluation if no improvement.  Active Problems:   Hyperlipidemia Continue atorvastatin 20 mg by mouth daily.    Essential hypertension, benign Amlodipine 10 mg by mouth daily. Monitor blood pressure.    Hyperkalemia Continue IV hydration. Follow-up potassium level.    Non-small cell carcinoma of left lung, stage 4 ([place]) Follow-up with oncology as scheduled.    Type 2 diabetes mellitus with insulin therapy ([place]) Continue IV hydration. Carbohydrate modified diet. CBG monitoring with regular insulin sliding scale.    Anemia Monitor hematocrit and hemoglobin.    Tobacco abuse disorder Nicotine replacement therapy ordered.    Leukocytosis. No fever or other signs of infection Monitor WBC.    DVT prophylaxis: SCDs. Code Status: Full code. Family Communication: His wife was present in the ED room. Disposition Plan: Admit for IV hydration, renal function and electrolyte monitoring. Consults called:  Admission status: Inpatient/Telemetry.   Reubin Milan MD [facility] Pager [phone]  If 7PM-7AM, please contact night-coverage [email or web address] Password [facility]  08/06/2016, 7:03 PM

## 2016-08-06 NOTE — ED Notes (Signed)
Bed: WA09 Expected date:  Expected time:  Means of arrival:  Comments: Ems

## 2016-08-06 NOTE — Telephone Encounter (Signed)
Called wife to f/u on pt's schedule after his biopsy yesterday.   Per Dr. Julien Nordmann, cancel appt on 6/26 and reschedule after we hear back from research study regarding molecular results and if pt is assigned to a sub study.  (See Research nurse consent note for more information).   When I called wife she informed nurse that pt has not urinated since yesterday morning.  She says he was also very weak after his biopsy yesterday and required a w/c to get from the car into their home.  She says she has been able to get him to drink 2 Ensures and a little bit of orange juice since he has been home.  She has offered him the urinal several times but he has not been able to urinate. She asks if this is "normal" after a biopsy? Informed wife is not normal and pt will likely need to go ED to be assessed and treated.  Informed wife I will notify Dr. Worthy Flank nurse since Dr. Julien Nordmann is not here today.  She will likely instruct for pt to be taken to ED.  Wife verbalized understanding and will wait for return call from nurse.   Will keep appt as scheduled on 6/26 for now just in case he does need to be seen next week.  Will follow up with Dr. Julien Nordmann and pt/wife on Monday 6/25.

## 2016-08-06 NOTE — Progress Notes (Signed)
Wife of patient called stating that he has not urinated for 24 hrs/bowel movement in 4 days/fatigue/weakness. MD Mohamed not in office today and no provider is in symptom management. RN instructed patient to go to ED to be evaluated.

## 2016-08-06 NOTE — ED Provider Notes (Signed)
[facility] DEPT Provider Note   CSN: [id number] Arrival date & time: 08/06/16  1241     History   Chief Complaint Chief Complaint  Patient presents with  . Urinary Retention    HPI Keith Holloway is a 56 y.o. 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 Patient had an ultrasound core biopsy for a liver lesion under ultrasound guidance by radiology yesterday afternoon without complications. This was performed for molecular studies after recent imaging revealed progressive hepatic disease. The patient states he has been unable to urinate since yesterday morning. This is associated with suprapubic pressure. He admits that he has not been drinking much fluid at home other then 2 ensures yesterday. The patient called Dr. Worthy Flank office for symptoms and was told to report to the emergency department. He is having no erythema, drainage or pain around procedure site.Marland Kitchen He denies fever, chills, nausea, vomiting, diarrhea, melena, hematochezia. No current chest pain or shortness of breath.   HPI  Past Medical History:  Diagnosis Date  . Acute hyperglycemia 11/26/2015  . Alcohol abuse   . Arthritis   . Blind   . Borderline hypertension   . CKD (chronic kidney disease), stage III   . Cocaine abuse    last [employer] post[employer] for cocaine 09-30-2015  . Depression   . Dyspnea    with exertion   . Dyspnea on effort   . Elevated blood sugar 11/26/2015  . Goals of care, counseling/discussion 03/09/2016  . History of acute renal failure    09-30-2015  . History of blood transfusion   . History of hyperkalemia    admission 09-30-2015 potassiom 8.0  with medications currently 10-14-2015 down to 4.7 normal  . History of traumatic head injury    1987  w/ LOC   --- no residual  . Hyperglycemia  02/17/2016  . Hyperlipidemia   . Hypertension 12/16/2015  . 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 Memory disorder 08/22/2013   alcohol abuse  . Migraine   . Nocturia   . Non-small cell carcinoma of lung, stage 4 [facility]) oncologist-  dr Julien Nordmann   w/ METs to liver dx 04/ 2016--  Stage IV (T2a, N3, M1b), squamous cell carcinoma of left lower lobe---  systemic chemotherapy every 3 weeks and currently due to evidence for disease progression has started immunotherapy  . Pilonidal abscess    recurrent  . Pleural effusion, bilateral    per ct 10-09-2015  new small right effusion and left small to moderate effusion , increased  . Polyneuropathy in diabetes(357.2)   . Port-a-cath in place   . Pulmonary nodules/lesions, multiple    pt has stage 4 lung cancer  . Recurrent left pleural effusion 01/06/2016  . Tobacco abuse counseling 10/09/2014  . Type 2 diabetes mellitus with insulin therapy ([place])   . Wears dentures    upper    Patient Active Problem List   Diagnosis Date Noted  . AKI (acute kidney injury) ([place]) 08/06/2016  . Type 2 diabetes mellitus with insulin therapy ([place]) 08/06/2016  . Anemia 08/06/2016  . Goals of care, counseling/discussion 03/09/2016  . Hyperglycemia 02/17/2016  . Recurrent left pleural effusion 01/06/2016  . Hypertension 12/16/2015  . Acute hyperglycemia 11/26/2015  . Elevated blood sugar 11/26/2015  . Non-small cell carcinoma  of left lung, stage 4 ([place]) 11/18/2015  . CHF (congestive heart failure) ([place]) 11/18/2015  . Cocaine abuse 11/18/2015  . Acute kidney injury ([place]) 09/30/2015  . SIRS (systemic inflammatory response syndrome) ([place]) 09/30/2015  . Hyperkalemia 09/23/2015  . Skin infection 06/10/2015  . Insomnia 05/20/2015  . Dehydration 04/29/2015  . Encounter for antineoplastic immunotherapy 02/18/2015  . Chronic renal insufficiency 01/23/2015  . Tobacco abuse counseling 10/09/2014  . Encounter for  antineoplastic chemotherapy 08/28/2014  . Non-small cell carcinoma of lung, stage 4 ([place]) 06/13/2014  . Other specified diabetes mellitus without complications ([place]) 22/03/5425  . Essential hypertension, benign 01/23/2014  . Memory disorder 08/22/2013  . Alcohol abuse 07/31/2013  . Diabetes mellitus with ophthalmic complication ([place]) 08/08/7626  . Hyperlipidemia 04/14/2006    Past Surgical History:  Procedure Laterality Date  . CATARACT EXTRACTION, BILATERAL  08/2012  . PILONIDAL CYST EXCISION N/A 10/17/2015   Procedure: EXCISION PILONIDAL DISEASE;  Surgeon: Leighton Ruff, MD;  Location: [facility];  Service: General;  Laterality: N/A;  . PORTACATH PLACEMENT  01/30/2015  . RETINAL DETACHMENT SURGERY Bilateral 04/2012       Home Medications    Prior to Admission medications   Medication Sig Start Date End Date Taking? Authorizing Provider  amLODipine (NORVASC) 10 MG tablet Take 1 tablet (10 mg total) by mouth daily. 11/19/15  Yes Barton Dubois, MD  aspirin EC 81 MG tablet Take 1 tablet by mouth every morning.    Yes [provider]  Cholecalciferol (VITAMIN D-3) 1000 units CAPS Take 1,000 Units by mouth daily.   Yes [provider]  docusate sodium (COLACE) 100 MG capsule Take 1 capsule (100 mg total) by mouth 2 (two) times daily. 11/19/15  Yes Barton Dubois, MD  FeFum-FePoly-FA-B Cmp-C-Biot (INTEGRA PLUS) CAPS Take 1 capsule by mouth every morning. 07/26/16  Yes Curt Bears, MD  ferrous sulfate 325 (65 FE) MG tablet Take 325 mg by mouth daily with breakfast.    Yes [provider]  insulin NPH-regular Human (NOVOLIN 70/30) (70-30) 100 UNIT/ML injection Inject 35 Units into the skin 2 (two) times daily with a meal. 01/23/14  Yes Advani, Deepak, MD  lactulose (CHRONULAC) 10 GM/15ML solution TAKE 30 MLS TWICE A DAY 08/05/16  Yes Curt Bears, MD  lidocaine-prilocaine (EMLA) cream APPLY 1 APPLICATION TOPICALLY AS NEEDED. 02/17/16  Yes  Curt Bears, MD  morphine (MS CONTIN) 30 MG 12 hr tablet Take 30 mg by mouth every 8 (eight) hours as needed for severe pain. 07/05/16  Yes [provider]  nicotine (NICODERM CQ) 14 mg/24hr patch Place 1 patch (14 mg total) onto the skin daily. 07/28/16  Yes Curt Bears, MD  ondansetron (ZOFRAN) 8 MG tablet TAKE 1 TABLET EVERY 8 HOURS AS NEEDED FOR NAUSEA AND VOMITING 06/30/16  Yes Curt Bears, MD  oxyCODONE (OXY IR/ROXICODONE) 5 MG immediate release tablet Take 1 tablet (5 mg total) by mouth every 6 (six) hours as needed for severe pain. 08/05/16 10/04/16 Yes Curt Bears, MD  simvastatin (ZOCOR) 40 MG tablet Take 1 tablet (40 mg total) by mouth daily. Patient taking differently: Take 40 mg by mouth every morning.  01/23/14  Yes Advani, Vernon Prey, MD  vitamin C (ASCORBIC ACID) 500 MG tablet Take 500 mg by mouth 2 (two) times daily.    Yes [provider]  acetaminophen (TYLENOL) 325 MG tablet Take 2 tablets (650 mg total) by mouth every 6 (six) hours as needed for mild pain (or Fever >/= 101). Patient not  taking: Reported on 08/06/2016 10/02/15   Lavina Hamman, MD  benzonatate (TESSALON) 100 MG capsule Take 1 capsule (100 mg total) by mouth 3 (three) times daily as needed for cough. Patient not taking: Reported on 08/06/2016 03/30/16   Curt Bears, MD  temazepam (RESTORIL) 30 MG capsule TAKE ONE CAPSULE AT BEDTIME Patient not taking: Reported on 08/06/2016 06/19/15   Curt Bears, MD    Family History Family History  Problem Relation Age of Onset  . Alzheimer's disease Brother   . Cancer Father   . Hypertension Father   . Heart attack Father   . Hypertension Mother   . Gout Mother   . Diabetes Sister   . Hypertension Sister   . Hypertension Sister   . Hypertension Sister     Social History Social History  Substance Use Topics  . Smoking status: Former Smoker    Packs/day: 0.25    Years: 32.00    Types: Cigarettes    Quit date: 10/01/2015  .  Smokeless tobacco: Never Used     Comment: recently stopped smoking uses nicoderm patch  . Alcohol use Yes     Comment: alcoholic     Allergies   Patient has no known allergies.   Review of Systems Review of Systems  All other systems reviewed and are negative.    Physical Exam Updated Vital Signs BP 131/88 (BP Location: Right Arm)   Pulse (!) 119   Temp 98.3 F (36.8 C) (Oral)   Resp 16   SpO2 94%   Physical Exam  Constitutional: He appears well-developed.  HENT:  Head: Normocephalic and atraumatic.  Right Ear: External ear normal.  Left Ear: External ear normal.  Nose: Nose normal.  Mouth/Throat: Oropharynx is clear and moist.  Eyes: Conjunctivae are normal. Right eye exhibits no discharge. Left eye exhibits no discharge. No scleral icterus.  Neck: Neck supple.  Cardiovascular: Normal rate, regular rhythm and intact distal pulses.   No murmur heard. Pulses:      Dorsalis pedis pulses are 2+ on the right side, and 2+ on the left side.       Posterior tibial pulses are 2+ on the right side, and 2+ on the left side.  No lower extremity swelling or edema.   Pulmonary/Chest: Effort normal and breath sounds normal. He exhibits no tenderness.  Abdominal: Soft. Normal appearance and bowel sounds are normal. There is no tenderness. There is no rebound, no guarding and no CVA tenderness.    Musculoskeletal: He exhibits no edema.  Lymphadenopathy:    He has no cervical adenopathy.  Neurological: He is alert.  Skin: Skin is warm and dry. No rash noted. He is not diaphoretic.  Psychiatric: He has a normal mood and affect.  Nursing note and vitals reviewed.    ED Treatments / Results  Labs (all labs ordered are listed, but only abnormal results are displayed) Labs Reviewed  URINALYSIS, ROUTINE W REFLEX MICROSCOPIC - Abnormal; Notable for the following:       Result Value   Color, Urine AMBER (*)    APPearance HAZY (*)    Protein, ur 100 (*)    Squamous Epithelial  / LPF 0-5 (*)    All other components within normal limits  CBC WITH DIFFERENTIAL/PLATELET - Abnormal; Notable for the following:    WBC 25.7 (*)    RBC 3.60 (*)    Hemoglobin 7.4 (*)    HCT 22.4 (*)    MCV 62.2 (*)  MCH 20.6 (*)    RDW 21.1 (*)    Neutro Abs 22.3 (*)    Basophils Absolute 0.3 (*)    All other components within normal limits  BASIC METABOLIC PANEL - Abnormal; Notable for the following:    Potassium 5.3 (*)    Glucose, Bld 183 (*)    BUN 36 (*)    Creatinine, Ser 2.93 (*)    GFR calc non Af Amer 22 (*)    GFR calc Af Amer 26 (*)    All other components within normal limits    EKG  EKG Interpretation None       Radiology US Biopsy  Result Date: 08/05/2016 CLINICAL DATA:  Lung carcinoma. Progressive liver lesions consistent metastatic disease. EXAM: ULTRASOUND GUIDED CORE BIOPSY OF LIVER LESION MEDICATIONS: Intravenous Fentanyl and Versed were administered as conscious sedation during continuous monitoring of the patient's level of consciousness and physiological / cardiorespiratory status by the radiology RN, with a total moderate sedation time of 13 minutes. PROCEDURE: The procedure, risks, benefits, and alternatives were explained to the patient. Questions regarding the procedure were encouraged and answered. The patient understands and consents to the procedure. . Survey ultrasound of the liver was performed. A representative lesion in the left lobe was localized and an appropriate skin entry site was determined and marked. The operative field was prepped with chlorhexidine in a sterile fashion, and a sterile drape was applied covering the operative field. A sterile gown and sterile gloves were used for the procedure. Local anesthesia was provided with 1% Lidocaine. Under real-time ultrasound guidance, a 17 gauge trocar needle was advanced to the margin of the lesion. Once needle tip position was confirmed, coaxial 18-gauge core biopsy samples were obtained,  submitted in formalin to surgical pathology. The guide needle was removed. Postprocedure scans show no hemorrhage or other apparent complication. The patient tolerated the procedure well. COMPLICATIONS: None. FINDINGS: Multiple liver lesions are identified corresponding to findings on recent CT. Representative core biopsy samples of a left lobe lesion obtained as above. IMPRESSION: 1. Technically successful CT-guided core biopsy, left liver lesion. Electronically Signed   By: Lucrezia Europe M.D.   On: 08/05/2016 14:00    Procedures Procedures (including critical care time)  Medications Ordered in ED Medications  sodium chloride 0.9 % bolus 1,000 mL (not administered)  sodium chloride 0.9 % bolus 1,000 mL (1,000 mLs Intravenous New Bag/Given 08/06/16 1619)     Initial Impression / Assessment and Plan / ED Course  I have reviewed the triage vital signs and the nursing notes.  Pertinent labs & imaging results that were available during my care of the patient were reviewed by me and considered in my medical decision making (see chart for details).     56 y.o. 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 Patient is non-toxic appearing with normal vital signs on presentation.   Prior to exam the patient was able to urinate in portable urinal. This relieved the patients symptoms. He has no abdominal tenderness. Will test patients urine, get basic labs and hydrate patient with IVF to ensure patient can continue to urinate. Ordered bladder scan to ensure there is not retention.   Patient with leukocytosis; microcytic anemia with Hgb from 9.3 yesterday to 7.4 today; AKI with bump in Cr from 1.86 yesterday to 2.93 today with  hyperkalemia (5.3).   Will give another liter of fluid.   Patient wife on phone.  Patient has been having ongoing constipation for some time. Last bowel movement 07/31/16. She also states that he has been complaining of right hip, shoulder and chest pain at night and in the early mornings. He takes oxycodone and morphine for this.   UA shows without UTI.  Patient discussed with Dr. Johnney Killian who is in agreeance with plan.   Lenn Sink, PA-C called for admission. Hospitalist agrees to admit. Patient in agreeance with plan. All questions answered. Patient stable at time of handoff.   Final Clinical Impressions(s) / ED Diagnoses   Final diagnoses:  Acute kidney injury ([place])  Anemia, unspecified type  History of lung cancer    New Prescriptions New Prescriptions   No medications on file     Lorelle Gibbs 08/06/16 1836    Charlesetta Shanks, MD 08/21/16 1432

## 2016-08-06 NOTE — ED Triage Notes (Signed)
Pt has stage 4 lung cancer. He had recent kidney biopsy and was released from the hospital yesterday. He has not been able to urinate since 530pm yesterday. A&Ox4.

## 2016-08-06 NOTE — ED Notes (Signed)
ED Provider at bedside. 

## 2016-08-07 ENCOUNTER — Inpatient Hospital Stay (HOSPITAL_COMMUNITY): Payer: Medicare Other

## 2016-08-07 ENCOUNTER — Encounter (HOSPITAL_COMMUNITY): Payer: Self-pay | Admitting: Nurse Practitioner

## 2016-08-07 DIAGNOSIS — Z794 Long term (current) use of insulin: Secondary | ICD-10-CM

## 2016-08-07 DIAGNOSIS — I10 Essential (primary) hypertension: Secondary | ICD-10-CM

## 2016-08-07 DIAGNOSIS — N179 Acute kidney failure, unspecified: Principal | ICD-10-CM

## 2016-08-07 DIAGNOSIS — E119 Type 2 diabetes mellitus without complications: Secondary | ICD-10-CM

## 2016-08-07 DIAGNOSIS — Z85118 Personal history of other malignant neoplasm of bronchus and lung: Secondary | ICD-10-CM

## 2016-08-07 DIAGNOSIS — N189 Chronic kidney disease, unspecified: Secondary | ICD-10-CM

## 2016-08-07 DIAGNOSIS — D649 Anemia, unspecified: Secondary | ICD-10-CM

## 2016-08-07 LAB — CBC WITH DIFFERENTIAL/PLATELET
BASOS ABS: 0 10*3/uL (ref 0.0–0.1)
Basophils Relative: 0 %
EOS ABS: 0.2 10*3/uL (ref 0.0–0.7)
Eosinophils Relative: 1 %
HCT: 18.6 % — ABNORMAL LOW (ref 39.0–52.0)
HEMOGLOBIN: 6.3 g/dL — AB (ref 13.0–17.0)
LYMPHS PCT: 6 %
Lymphs Abs: 1.3 10*3/uL (ref 0.7–4.0)
MCH: 21.1 pg — ABNORMAL LOW (ref 26.0–34.0)
MCHC: 33.9 g/dL (ref 30.0–36.0)
MCV: 62.2 fL — ABNORMAL LOW (ref 78.0–100.0)
MONOS PCT: 9 %
Monocytes Absolute: 2 10*3/uL — ABNORMAL HIGH (ref 0.1–1.0)
NEUTROS ABS: 18.4 10*3/uL — AB (ref 1.7–7.7)
NEUTROS PCT: 84 %
Platelets: 266 10*3/uL (ref 150–400)
RBC: 2.99 MIL/uL — AB (ref 4.22–5.81)
RDW: 21 % — ABNORMAL HIGH (ref 11.5–15.5)
WBC: 21.9 10*3/uL — AB (ref 4.0–10.5)

## 2016-08-07 LAB — COMPREHENSIVE METABOLIC PANEL
ALK PHOS: 227 U/L — AB (ref 38–126)
ALT: 39 U/L (ref 17–63)
ANION GAP: 10 (ref 5–15)
AST: 99 U/L — ABNORMAL HIGH (ref 15–41)
Albumin: 2.8 g/dL — ABNORMAL LOW (ref 3.5–5.0)
BILIRUBIN TOTAL: 0.4 mg/dL (ref 0.3–1.2)
BUN: 27 mg/dL — ABNORMAL HIGH (ref 6–20)
CALCIUM: 8.6 mg/dL — AB (ref 8.9–10.3)
CO2: 23 mmol/L (ref 22–32)
CREATININE: 1.97 mg/dL — AB (ref 0.61–1.24)
Chloride: 104 mmol/L (ref 101–111)
GFR calc non Af Amer: 36 mL/min — ABNORMAL LOW (ref >60)
GFR, EST AFRICAN AMERICAN: 42 mL/min — AB (ref >60)
GLUCOSE: 107 mg/dL — AB (ref 65–99)
Potassium: 5 mmol/L (ref 3.5–5.1)
SODIUM: 137 mmol/L (ref 135–145)
TOTAL PROTEIN: 6.7 g/dL (ref 6.5–8.1)

## 2016-08-07 LAB — GLUCOSE, CAPILLARY
GLUCOSE-CAPILLARY: 104 mg/dL — AB (ref 65–99)
GLUCOSE-CAPILLARY: 145 mg/dL — AB (ref 65–99)
Glucose-Capillary: 185 mg/dL — ABNORMAL HIGH (ref 65–99)

## 2016-08-07 LAB — PREPARE RBC (CROSSMATCH)

## 2016-08-07 LAB — MRSA PCR SCREENING: MRSA by PCR: NEGATIVE

## 2016-08-07 LAB — HIV ANTIBODY (ROUTINE TESTING W REFLEX): HIV Screen 4th Generation wRfx: NONREACTIVE

## 2016-08-07 MED ORDER — SODIUM CHLORIDE 0.9 % IV SOLN
INTRAVENOUS | Status: DC
  Administered 2016-08-07: 21:00:00 via INTRAVENOUS

## 2016-08-07 MED ORDER — ACETAMINOPHEN 325 MG PO TABS
650.0000 mg | ORAL_TABLET | Freq: Four times a day (QID) | ORAL | Status: DC | PRN
  Administered 2016-08-07: 650 mg via ORAL
  Filled 2016-08-07: qty 2

## 2016-08-07 MED ORDER — TAMSULOSIN HCL 0.4 MG PO CAPS
0.4000 mg | ORAL_CAPSULE | Freq: Every day | ORAL | Status: DC
  Administered 2016-08-07 – 2016-08-08 (×2): 0.4 mg via ORAL
  Filled 2016-08-07 (×2): qty 1

## 2016-08-07 MED ORDER — SODIUM CHLORIDE 0.9 % IV SOLN
Freq: Once | INTRAVENOUS | Status: AC
  Administered 2016-08-07: 14:00:00 via INTRAVENOUS

## 2016-08-07 NOTE — Progress Notes (Signed)
PROGRESS NOTE    Keith Holloway  TJQ:[id number] DOB: 07/23/1960 DOA: 08/06/2016 PCP: [name], MD   Brief Narrative: 56 y.o. male with medical history significant of type 2 diabetes, alcohol abuse, osteoarthritis, legally blind, hypertension, chronic kidney disease, hyperlipidemia, stage IV non-small cell carcinoma of the lung with history of chemo/immunotherapy who underwent a liver biopsy for molecular studies uneventfully the day prior to admission, presented with on table to avoid for one day after the procedure. In the ER patient was treated with 2 L of IV fluid. Creatinine was elevated. Admitted for further evaluation.  Assessment & Plan:   # Acute kidney injury on chronic kidney disease stage III likely due to obstructive uropathy: -Bladder scan with more than 600 mL of urine and patient is unable to void and required frequent intermittent catheterization. -Planned to place Foley catheter, started Flomax, checking ultrasound of kidneys to rule out any abnormalities. -Serum creatinine level trending down to around baseline. -Monitor BMP, avoid nephrotoxins. -UA unremarkable.  # Microcytic anemia: Hemoglobin of 6.3 with MCV 62.2. No sign of active bleeding. I will check fecal occult blood test and iron stores. -Patient has generalized weakness. Plan to transfuse 2 units of red blood cell today. Monitor CBC.  #Hyperlipidemia: Continue Lipitor  #Essential hypertension: Monitor blood pressure closely. Continue amlodipine 10 mg.  #Non-small cell carcinoma of left lung, stage IV: Recommended to follow-up with oncologist outpatient including the biopsy result. Currently on morphine long-acting for pain management.  #Type 2 diabetes with insulin therapy: Continue current insulin regimen. Monitor blood sugar level. Continue carb modified diet.  #Tobacco abuse: Continue nicotine patch.  DVT prophylaxis: SCD. No anticoagulation because of severe anemia. Code Status: Full code Family  Communication: Discussed with patient's sister at bedside Disposition Plan: Likely discharge home in 1-2 days    Consultants:   None  Procedures: Foley catheter insertion Antimicrobials: None  Subjective: Seen and examined at bedside. Feeling better. Denied fever, chills, headache, dizziness, nausea or vomiting.  Objective: Vitals:   08/06/16 2101 08/06/16 2109 08/07/16 0607 08/07/16 1350  BP:  134/85 127/71 117/75  Pulse:  (!) 116 (!) 102 99  Resp:  16 16 16   Temp:  98.3 F (36.8 C) 99 F (37.2 C) 99.4 F (37.4 C)  TempSrc:  Oral Oral Oral  SpO2:  95% 92% 96%  Weight: 71.7 kg (158 lb 1.1 oz)     Height: 5\' 11"  (1.803 m)       Intake/Output Summary (Last 24 hours) at 08/07/16 1359 Last data filed at 08/07/16 1300  Gross per 24 hour  Intake          2815.42 ml  Output             1025 ml  Net          1790.42 ml   Filed Weights   08/06/16 2101  Weight: 71.7 kg (158 lb 1.1 oz)    Examination:  General exam: Appears calm and comfortable , legally blind Respiratory system: Clear to auscultation. Respiratory effort normal. No wheezing or crackle Cardiovascular system: S1 & S2 heard, RRR.  No pedal edema. Gastrointestinal system: Abdomen is mild distention. Soft, nontender. Bowel sound positive. Central nervous system: Alert awake and following commands  Extremities: Symmetric 5 x 5 power. Skin: No rashes, lesions or ulcers   Data Reviewed: I have personally reviewed following labs and imaging studies  CBC:  Recent Labs Lab 08/05/16 1207 08/06/16 1527 08/06/16 2230 08/07/16 0856  WBC 20.4* 25.7*  --  21.9*  NEUTROABS 17.0* 22.3*  --  18.4*  HGB 9.3* 7.4* 7.2* 6.3*  HCT 27.4* 22.4* 21.5* 18.6*  MCV 61.6* 62.2*  --  62.2*  PLT 312 299  --  884   Basic Metabolic Panel:  Recent Labs Lab 08/05/16 1207 08/06/16 1527 08/06/16 2230 08/07/16 0345  NA 136 135 136 137  K 4.6 5.3* 4.6 5.0  CL 100* 101 104 104  CO2 27 25 26 23   GLUCOSE 132* 183* 201*  107*  BUN 22* 36* 30* 27*  CREATININE 1.86* 2.93* 2.21* 1.97*  CALCIUM 9.6 9.2 8.5* 8.6*   GFR: Estimated Creatinine Clearance: 42.5 mL/min (A) (by C-G formula based on SCr of 1.97 mg/dL (H)). Liver Function Tests:  Recent Labs Lab 08/05/16 1207 08/07/16 0345  AST 116* 99*  ALT 48 39  ALKPHOS 266* 227*  BILITOT 0.5 0.4  PROT 8.1 6.7  ALBUMIN 3.3* 2.8*   No results for input(s): LIPASE, AMYLASE in the last 168 hours. No results for input(s): AMMONIA in the last 168 hours. Coagulation Profile:  Recent Labs Lab 08/05/16 1207  INR 1.18   Cardiac Enzymes:  Recent Labs Lab 08/06/16 1527  CKTOTAL 96   BNP (last 3 results) No results for input(s): PROBNP in the last 8760 hours. HbA1C: No results for input(s): HGBA1C in the last 72 hours. CBG:  Recent Labs Lab 08/05/16 1114 08/06/16 2127 08/07/16 0721 08/07/16 1127  GLUCAP 167* 146* 104* 185*   Lipid Profile: No results for input(s): CHOL, HDL, LDLCALC, TRIG, CHOLHDL, LDLDIRECT in the last 72 hours. Thyroid Function Tests: No results for input(s): TSH, T4TOTAL, FREET4, T3FREE, THYROIDAB in the last 72 hours. Anemia Panel: No results for input(s): VITAMINB12, FOLATE, FERRITIN, TIBC, IRON, RETICCTPCT in the last 72 hours. Sepsis Labs: No results for input(s): PROCALCITON, LATICACIDVEN in the last 168 hours.  Recent Results (from the past 240 hour(s))  MRSA PCR Screening     Status: None   Collection Time: 08/06/16  9:09 PM  Result Value Ref Range Status   MRSA by PCR NEGATIVE NEGATIVE Final    Comment:        The GeneXpert MRSA Assay (FDA approved for NASAL specimens only), is one component of a comprehensive MRSA colonization surveillance program. It is not intended to diagnose MRSA infection nor to guide or monitor treatment for MRSA infections.          Radiology Studies: US Renal  Result Date: 08/07/2016 CLINICAL DATA:  Chronic kidney disease. Dysuria. Liver biopsy 2 days ago. Lung cancer  with liver metastases. EXAM: RENAL / URINARY TRACT ULTRASOUND COMPLETE COMPARISON:  CT 05/06/2016 FINDINGS: Right Kidney: Length: 11.0 cm. Echogenicity within normal limits. No mass or hydronephrosis visualized. Left Kidney: Length: 12.1 cm. Echogenicity within normal limits. No mass or hydronephrosis visualized. Bladder: Appears normal for degree of bladder distention. Bilateral ureteral jets visualized. Incidentally noted is a small amount of ascites. IMPRESSION: Normal size kidneys without hydronephrosis. Small amount of ascites. Electronically Signed   By: Marin Olp M.D.   On: 08/07/2016 11:19        Scheduled Meds: . amLODipine  10 mg Oral Daily  . aspirin EC  81 mg Oral q morning - 10a  . atorvastatin  20 mg Oral Daily  . cholecalciferol  1,000 Units Oral Daily  . docusate sodium  100 mg Oral BID  . insulin aspart  0-9 Units Subcutaneous TID WC  . lactulose  10 g Oral TID  . morphine  30 mg  Oral Q8H  . multivitamin with minerals  1 tablet Oral Daily  . nicotine  14 mg Transdermal Daily  . tamsulosin  0.4 mg Oral Daily   Continuous Infusions: . sodium chloride    . sodium chloride 125 mL/hr at 08/07/16 0939     LOS: 1 day    Nelwyn Hebdon Tanna Furry, MD [facility] Pager [phone]  If 7PM-7AM, please contact night-coverage [email or web address] Password [facility] 08/07/2016, 1:59 PM

## 2016-08-07 NOTE — Progress Notes (Signed)
CRITICAL VALUE ALERT  Critical Value:  Hgb 6.3  Date & Time Notied:  08/07/2016 0946  Provider Notified: Dr. Carolin Sicks  Orders Received/Actions taken:

## 2016-08-08 LAB — CBC
HEMATOCRIT: 23.5 % — AB (ref 39.0–52.0)
HEMOGLOBIN: 8.1 g/dL — AB (ref 13.0–17.0)
MCH: 22.8 pg — ABNORMAL LOW (ref 26.0–34.0)
MCHC: 34.5 g/dL (ref 30.0–36.0)
MCV: 66.2 fL — ABNORMAL LOW (ref 78.0–100.0)
Platelets: 247 10*3/uL (ref 150–400)
RBC: 3.55 MIL/uL — ABNORMAL LOW (ref 4.22–5.81)
RDW: 24.2 % — ABNORMAL HIGH (ref 11.5–15.5)
WBC: 21.9 10*3/uL — AB (ref 4.0–10.5)

## 2016-08-08 LAB — FERRITIN: FERRITIN: 687 ng/mL — AB (ref 24–336)

## 2016-08-08 LAB — BASIC METABOLIC PANEL
ANION GAP: 8 (ref 5–15)
BUN: 18 mg/dL (ref 6–20)
CHLORIDE: 105 mmol/L (ref 101–111)
CO2: 25 mmol/L (ref 22–32)
Calcium: 9 mg/dL (ref 8.9–10.3)
Creatinine, Ser: 1.09 mg/dL (ref 0.61–1.24)
GFR calc Af Amer: >60 mL/min (ref >60)
GFR calc non Af Amer: >60 mL/min (ref >60)
GLUCOSE: 108 mg/dL — AB (ref 65–99)
POTASSIUM: 4.6 mmol/L (ref 3.5–5.1)
SODIUM: 138 mmol/L (ref 135–145)

## 2016-08-08 LAB — IRON AND TIBC
Iron: 107 ug/dL (ref 45–182)
SATURATION RATIOS: 51 % — AB (ref 17.9–39.5)
TIBC: 210 ug/dL — ABNORMAL LOW (ref 250–450)
UIBC: 103 ug/dL

## 2016-08-08 LAB — GLUCOSE, CAPILLARY
GLUCOSE-CAPILLARY: 138 mg/dL — AB (ref 65–99)
Glucose-Capillary: 83 mg/dL (ref 65–99)

## 2016-08-08 MED ORDER — INSULIN NPH ISOPHANE & REGULAR (70-30) 100 UNIT/ML ~~LOC~~ SUSP: 10.0000 [IU] | Freq: Two times a day (BID) | SUBCUTANEOUS | 0 refills | Status: DC

## 2016-08-08 MED ORDER — TAMSULOSIN HCL 0.4 MG PO CAPS: 0.4000 mg | ORAL_CAPSULE | Freq: Every day | ORAL | 0 refills | Status: AC

## 2016-08-08 MED ORDER — HEPARIN SOD (PORK) LOCK FLUSH 100 UNIT/ML IV SOLN
500.0000 [IU] | INTRAVENOUS | Status: AC | PRN
  Administered 2016-08-08: 500 [IU]

## 2016-08-08 NOTE — Care Management Note (Signed)
Case Management Note  Patient Details  Name: Keith Holloway MRN: [id number] Date of Birth: 1960-07-22  Subjective/Objective:     DM, AKI, lung cancer               Action/Plan: Discharge Planning:  NCM spoke to pt and offered choice for HH/list provided. Pt states he had [facility] in the past. [facility] rep with new referral. Pt states no DME needed. Wife at home to assist with care.   PCP [name] MD  Expected Discharge Date:  08/08/16               Expected Discharge Plan:  [place]  In-House Referral:  NA  Discharge planning Services  CM Consult  Post Acute Care Choice:  Home Health Choice offered to:  Patient  DME Arranged:  N/A DME Agency:  NA  HH Arranged:  RN, Nurse's Aide [place] Agency:  [place]  Status of Service:  Completed, signed off  If discussed at [place] of Stay Meetings, dates discussed:    Additional Comments:  Erenest Rasher, RN 08/08/2016, 4:25 PM

## 2016-08-08 NOTE — Discharge Summary (Signed)
Physician Discharge Summary  Keith Holloway ZOX:[id number] DOB: 01-21-1961 DOA: 08/06/2016  PCP: [name], MD  Admit date: 08/06/2016 Discharge date: 08/08/2016  Admitted From:home Disposition:home with home care service  Recommendations for Outpatient Follow-up:  1. Follow up with PCP in 1-2 weeks 2. 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 In the ER patient was treated with 2 L of IV fluid. Creatinine was elevated. Admitted for further evaluation.  # Acute kidney injury on chronic kidney disease stage III likely due to obstructive uropathy/Acute urinary retention and possibly prerenal as well -Bladder scan with more than 600 mL of urine on admission required catheter insertion. Ultrasound of kidneys unremarkable without hydronephrosis. Started on Flomax. The Foley catheter was removed. Patient was able to void after the catheter was removed. Serum creatinine level improved. Recommended to monitor hydration and follow-up labs with PCP in a week.  # Microcytic anemia: Hemoglobin of 6.3 with MCV 62.2.  Received red blood cell transfusion. Hemoglobin stable. Iron stores acceptable No sign of bleeding. Recommended to follow-up with oncologist. #Hyperlipidemia: Continue Lipitor  #Essential hypertension: Monitor blood pressure closely. Continue amlodipine 10 mg.  #Non-small cell carcinoma of left lung, stage IV: Recommended to follow-up  with oncologist outpatient including the biopsy result. Currently on morphine long-acting for pain management.  #Type 2 diabetes with insulin therapy: Continue current insulin regimen. Monitor blood sugar level. Continue carb modified diet. Recommended to adjust the dose of insulin depending on blood sugar level.  #Tobacco abuse: Continue nicotine patch.  Patient is clinically improved. Able to void after the catheter removed pressure function improved. Home care aide and nurse ordered on discharge. Patient has chronic leukocytosis. Recommended to follow-up with his oncologist on discharge.  Discharge Diagnoses:  Principal Problem:   Acute kidney injury superimposed on CKD ([place]) Active Problems:   Hyperlipidemia   Essential hypertension, benign   Hyperkalemia   Non-small cell carcinoma of left lung, stage 4 (HCC)   Type 2 diabetes mellitus with insulin therapy ([place])   Anemia   Tobacco abuse disorder   History of lung cancer    Discharge Instructions  Discharge Instructions    Call MD for:  difficulty breathing, headache or visual disturbances    Complete by:  As directed    Call MD for:  extreme fatigue    Complete by:  As directed    Call MD for:  hives    Complete by:  As directed    Call MD for:  persistant dizziness or light-headedness    Complete by:  As directed    Call MD for:  persistant nausea and vomiting    Complete by:  As directed    Call MD for:  severe uncontrolled pain    Complete by:  As directed    Call MD for:  temperature >100.4    Complete by:  As directed    Diet - low sodium heart healthy    Complete by:  As directed    Diet Carb Modified    Complete by:  As directed    Face-to-face encounter (  required for Medicare/Medicaid patients)    Complete by:  As directed    I Sidonia Nutter Tanna Furry certify that this patient is under my care and that I, or a nurse practitioner or physician's assistant working with me, had a face-to-face encounter that meets  the physician face-to-face encounter requirements with this patient on 08/08/2016. The encounter with the patient was in whole, or in part for the following medical condition(s) which is the primary reason for home health care (List medical condition): weakness, legally blind   The encounter with the patient was in whole, or in part, for the following medical condition, which is the primary reason for home health care:  weakness   I certify that, based on my findings, the following services are medically necessary home health services:  Nursing   Reason for Medically Necessary Home Health Services:  Other See Comments   My clinical findings support the need for the above services:  OTHER SEE COMMENTS   Further, I certify that my clinical findings support that this patient is homebound due to:  Unable to leave home safely without assistance   Home Health    Complete by:  As directed    To provide the following care/treatments:   [place]     Increase activity slowly    Complete by:  As directed      Allergies as of 08/08/2016   No Known Allergies     Medication List    STOP taking these medications   benzonatate 100 MG capsule Commonly known as:  TESSALON   temazepam 30 MG capsule Commonly known as:  RESTORIL     TAKE these medications   acetaminophen 325 MG tablet Commonly known as:  TYLENOL Take 2 tablets (650 mg total) by mouth every 6 (six) hours as needed for mild pain (or Fever >/= 101).   amLODipine 10 MG tablet Commonly known as:  NORVASC Take 1 tablet (10 mg total) by mouth daily.   aspirin EC 81 MG tablet Take 1 tablet by mouth every morning.   docusate sodium 100 MG capsule Commonly known as:  COLACE Take 1 capsule (100 mg total) by mouth 2 (two) times daily.   ferrous sulfate 325 (65 FE) MG tablet Take 325 mg by mouth daily with breakfast.   insulin NPH-regular Human (70-30) 100 UNIT/ML injection Commonly known as:  NOVOLIN 70/30 Inject 10 Units  into the skin 2 (two) times daily with a meal. What changed:  how much to take   INTEGRA PLUS Caps Take 1 capsule by mouth every morning.   lactulose 10 GM/15ML solution Commonly known as:  CHRONULAC TAKE 30 MLS TWICE A DAY   lidocaine-prilocaine cream Commonly known as:  EMLA APPLY 1 APPLICATION TOPICALLY AS NEEDED.   morphine 30 MG 12 hr tablet Commonly known as:  MS CONTIN Take 30 mg by mouth every 8 (eight) hours as needed for severe pain.   nicotine 14 mg/24hr patch Commonly known as:  NICODERM CQ Place 1 patch (14 mg total) onto the skin daily.   ondansetron 8 MG tablet Commonly known as:  ZOFRAN TAKE 1 TABLET EVERY 8 HOURS AS NEEDED FOR NAUSEA AND VOMITING   oxyCODONE 5 MG immediate release tablet Commonly known as:  Oxy IR/ROXICODONE Take 1 tablet (5 mg total) by mouth every 6 (six) hours as needed for severe pain.   simvastatin 40 MG tablet Commonly known as:  ZOCOR Take 1 tablet (40 mg total) by mouth daily. What  changed:  when to take this   tamsulosin 0.4 MG Caps capsule Commonly known as:  FLOMAX Take 1 capsule (0.4 mg total) by mouth daily. Start taking on:  08/09/2016   vitamin C 500 MG tablet Commonly known as:  ASCORBIC ACID Take 500 mg by mouth 2 (two) times daily.   Vitamin D-3 1000 units Caps Take 1,000 Units by mouth daily.      Follow-up Information    [name], MD. Schedule an appointment as soon as possible for a visit in 1 week(s).   Specialty:  Internal Medicine Contact information: [place] [phone]          No Known Allergies  Consultations: None  Procedures/Studies: Blood transfusion  Subjective: Seen and examined at bedside. Reported doing well. Denied headache, dizziness, nausea vomiting chest pain or shortness of breath.  Discharge Exam: Vitals:   08/08/16 0542 08/08/16 1322  BP: 133/87 119/71  Pulse: 99 94  Resp: 18 16  Temp: 99.2 F (37.3 C) 98.5 F (36.9 C)    Vitals:   08/07/16 1735 08/07/16 2034 08/08/16 0542 08/08/16 1322  BP: 123/77 120/72 133/87 119/71  Pulse: 92 90 99 94  Resp: 18 18 18 16   Temp: 98.9 F (37.2 C) 98.5 F (36.9 C) 99.2 F (37.3 C) 98.5 F (36.9 C)  TempSrc: Oral Oral Oral Oral  SpO2: 100% 94% 96% 97%  Weight:      Height:        General: Pt is alert, awake, not in acute distress, Legally blind Cardiovascular: RRR, S1/S2 +, no rubs, no gallops Respiratory: CTA bilaterally, no wheezing, no rhonchi Abdominal: Soft, NT, ND, bowel sounds + Extremities: no edema, no cyanosis    The results of significant diagnostics from this hospitalization (including imaging, microbiology, ancillary and laboratory) are listed below for reference.     Microbiology: Recent Results (from the past 240 hour(s))  MRSA PCR Screening     Status: None   Collection Time: 08/06/16  9:09 PM  Result Value Ref Range Status   MRSA by PCR NEGATIVE NEGATIVE Final    Comment:        The GeneXpert MRSA Assay (FDA approved for NASAL specimens only), is one component of a comprehensive MRSA colonization surveillance program. It is not intended to diagnose MRSA infection nor to guide or monitor treatment for MRSA infections.      Labs: BNP (last 3 results) No results for input(s): BNP in the last 8760 hours. Basic Metabolic Panel:  Recent Labs Lab 08/05/16 1207 08/06/16 1527 08/06/16 2230 08/07/16 0345 08/08/16 0114  NA 136 135 136 137 138  K 4.6 5.3* 4.6 5.0 4.6  CL 100* 101 104 104 105  CO2 27 25 26 23 25   GLUCOSE 132* 183* 201* 107* 108*  BUN 22* 36* 30* 27* 18  CREATININE 1.86* 2.93* 2.21* 1.97* 1.09  CALCIUM 9.6 9.2 8.5* 8.6* 9.0   Liver Function Tests:  Recent Labs Lab 08/05/16 1207 08/07/16 0345  AST 116* 99*  ALT 48 39  ALKPHOS 266* 227*  BILITOT 0.5 0.4  PROT 8.1 6.7  ALBUMIN 3.3* 2.8*   No results for input(s): LIPASE, AMYLASE in the last 168 hours. No results for input(s): AMMONIA in the last 168  hours. CBC:  Recent Labs Lab 08/05/16 1207 08/06/16 1527 08/06/16 2230 08/07/16 0856 08/08/16 0114  WBC 20.4* 25.7*  --  21.9* 21.9*  NEUTROABS 17.0* 22.3*  --  18.4*  --   HGB  9.3* 7.4* 7.2* 6.3* 8.1*  HCT 27.4* 22.4* 21.5* 18.6* 23.5*  MCV 61.6* 62.2*  --  62.2* 66.2*  PLT 312 299  --  266 247   Cardiac Enzymes:  Recent Labs Lab 08/06/16 1527  CKTOTAL 96   BNP: Invalid input(s): POCBNP CBG:  Recent Labs Lab 08/07/16 0721 08/07/16 1127 08/07/16 2044 08/08/16 0735 08/08/16 1146  GLUCAP 104* 185* 145* 83 138*   D-Dimer No results for input(s): DDIMER in the last 72 hours. Hgb A1c No results for input(s): HGBA1C in the last 72 hours. Lipid Profile No results for input(s): CHOL, HDL, LDLCALC, TRIG, CHOLHDL, LDLDIRECT in the last 72 hours. Thyroid function studies No results for input(s): TSH, T4TOTAL, T3FREE, THYROIDAB in the last 72 hours.  Invalid input(s): FREET3 Anemia work up  Recent Labs  08/08/16 0114  FERRITIN 687*  TIBC 210*  IRON 107   Urinalysis    Component Value Date/Time   COLORURINE AMBER (A) 08/06/2016 1349   APPEARANCEUR HAZY (A) 08/06/2016 1349   LABSPEC 1.017 08/06/2016 1349   PHURINE 5.0 08/06/2016 1349   GLUCOSEU NEGATIVE 08/06/2016 1349   HGBUR NEGATIVE 08/06/2016 1349   BILIRUBINUR NEGATIVE 08/06/2016 1349   KETONESUR NEGATIVE 08/06/2016 1349   PROTEINUR 100 (A) 08/06/2016 1349   UROBILINOGEN 0.2 05/12/2010 1656   NITRITE NEGATIVE 08/06/2016 1349   LEUKOCYTESUR NEGATIVE 08/06/2016 1349   Sepsis Labs Invalid input(s): PROCALCITONIN,  WBC,  LACTICIDVEN Microbiology Recent Results (from the past 240 hour(s))  MRSA PCR Screening     Status: None   Collection Time: 08/06/16  9:09 PM  Result Value Ref Range Status   MRSA by PCR NEGATIVE NEGATIVE Final    Comment:        The GeneXpert MRSA Assay (FDA approved for NASAL specimens only), is one component of a comprehensive MRSA colonization surveillance program. It is  not intended to diagnose MRSA infection nor to guide or monitor treatment for MRSA infections.      Time coordinating discharge: 35 minutes  SIGNED:   Rosita Fire, MD  [facility] 08/08/2016, 3:56 PM  If 7PM-7AM, please contact night-coverage [email or web address] Password [email or web address]

## 2016-08-08 NOTE — Evaluation (Signed)
Physical Therapy Evaluation-1x Patient Details Name: Keith Holloway MRN: [id number] DOB: 04-11-1960 Today's Date: 08/08/2016   History of Present Illness  56 yo male admitted with AKI, anemia. Hx of blindness, NSCLC, CHF, DM, ETOH abuse, OA, CKD.   Clinical Impression  On eval, pt was Min guard assist for mobility. He walked ~125 feet in hallway. Close guard for safety, vision deficits. He tolerated distance well. No LOB. Discussed d/c plan-pt will return home. He politely declines any PT follow up. Recommend supervision for mobility until pt returns to baseline. Plan is for possible d/c home later today. 1x eval. Will sign off.     Follow Up Recommendations No PT follow up;Supervision for mobility/OOB    Equipment Recommendations  None recommended by PT    Recommendations for Other Services       Precautions / Restrictions Precautions Precautions: Fall Precaution Comments: legally blind Restrictions Weight Bearing Restrictions: No      Mobility  Bed Mobility Overal bed mobility: Modified Independent                Transfers Overall transfer level: Needs assistance   Transfers: Sit to/from Stand Sit to Stand: Supervision         General transfer comment: for safety, lines  Ambulation/Gait Ambulation/Gait assistance: Min guard Ambulation Distance (Feet): 125 Feet Assistive device: None Gait Pattern/deviations: Step-through pattern;Decreased stride length;Decreased step length - right;Decreased step length - left     General Gait Details: pt tends to take short, guarded steps. No LOB. He tolerated distance well.   Stairs            Wheelchair Mobility    Modified Rankin (Stroke Patients Only)       Balance                                             Pertinent Vitals/Pain Pain Assessment: Faces Faces Pain Scale: Hurts little more Pain Location: lower abdomen Pain Descriptors / Indicators: Sore Pain Intervention(s):  Monitored during session    Home Living Family/patient expects to be discharged to:: Private residence Living Arrangements: Spouse/significant other   Type of Home: House Home Access: Stairs to enter Entrance Stairs-Rails: Left Entrance Stairs-Number of Steps: 3 Home Layout: Two level;Able to live on main level with bedroom/bathroom Home Equipment: Wheelchair - manual (cane for the blind)      Prior Function Level of Independence: Independent with assistive device(s)         Comments: uses cane for the blind PRN     Hand Dominance        Extremity/Trunk Assessment   Upper Extremity Assessment Upper Extremity Assessment: Overall WFL for tasks assessed    Lower Extremity Assessment Lower Extremity Assessment: Overall WFL for tasks assessed    Cervical / Trunk Assessment Cervical / Trunk Assessment: Normal  Communication   Communication: No difficulties  Cognition Arousal/Alertness: Awake/alert Behavior During Therapy: WFL for tasks assessed/performed Overall Cognitive Status: Within Functional Limits for tasks assessed                                        General Comments      Exercises     Assessment/Plan    PT Assessment Patent does not need any further PT services  PT Problem List  PT Treatment Interventions      PT Goals (Current goals can be found in the Care Plan section)  Acute Rehab PT Goals Patient Stated Goal: home PT Goal Formulation: All assessment and education complete, DC therapy    Frequency     Barriers to discharge        Co-evaluation               AM-PAC PT "6 Clicks" Daily Activity  Outcome Measure Difficulty turning over in bed (including adjusting bedclothes, sheets and blankets)?: None Difficulty moving from lying on back to sitting on the side of the bed? : None Difficulty sitting down on and standing up from a chair with arms (e.g., wheelchair, bedside commode, etc,.)?: None Help  needed moving to and from a bed to chair (including a wheelchair)?: A Little Help needed walking in hospital room?: A Little Help needed climbing 3-5 steps with a railing? : A Little 6 Click Score: 21    End of Session   Activity Tolerance: Patient tolerated treatment well Patient left: in bed;with call bell/phone within reach;with bed alarm set        Time: 8882-8003 PT Time Calculation (min) (ACUTE ONLY): 9 min   Charges:   PT Evaluation $PT Eval Low Complexity: 1 Procedure     PT G Codes:          Weston Anna, MPT Pager: [phone]

## 2016-08-08 NOTE — Evaluation (Signed)
Occupational Therapy One Time Evaluation Patient Details Name: Keith Holloway MRN: [id number] DOB: Feb 23, 1960 Today's Date: 08/08/2016    History of Present Illness 56 yo male admitted with AKI, anemia. Hx of blindness, NSCLC, CHF, DM, ETOH abuse, OA, CKD.    Clinical Impression   Pt is overall at min guard assist with ADL. Educated on pacing self with activities once home for safety and discussed shower chair options that will work with his shower. No further acute OT needs.    Follow Up Recommendations  No OT follow up;Supervision - Intermittent    Equipment Recommendations  None recommended by OT (pt looking into a different shower chair)    Recommendations for Other Services       Precautions / Restrictions Precautions Precautions: Fall Precaution Comments: legally blind Restrictions Weight Bearing Restrictions: No      Mobility Bed Mobility Overal bed mobility: Modified Independent             General bed mobility comments: at EOB.  Transfers Overall transfer level: Needs assistance   Transfers: Sit to/from Stand Sit to Stand: Supervision         General transfer comment: for safety.    Balance                                           ADL either performed or assessed with clinical judgement   ADL Overall ADL's : Needs assistance/impaired Eating/Feeding: Set up;Sitting   Grooming: Wash/dry hands;Min guard;Standing   Upper Body Bathing: Set up;Sitting   Lower Body Bathing: Min guard;Sit to/from stand   Upper Body Dressing : Set up;Sitting   Lower Body Dressing: Min guard;Sit to/from stand   Toilet Transfer: Min guard;Ambulation;Comfort height toilet   Toileting- Clothing Manipulation and Hygiene: Min guard;Sit to/from stand         General ADL Comments: Pt's wife present for session also. She states pt has a shower chair but it is too large for their shower. Discussed other shower chair options that are slimmer for  shower. She states pt had difficulty about a week ago with having enough strength to do a shower so recommended pt have a chair in the shower when he does initial shower at home. She states she will look into these chair options. Discussed pacing self with activity and not rushing for safety.      Vision Baseline Vision/History: Legally blind       Perception     Praxis      Pertinent Vitals/Pain Pain Assessment: No/denies pain Faces Pain Scale: Hurts little more Pain Location: lower abdomen Pain Descriptors / Indicators: Sore Pain Intervention(s): Monitored during session     Hand Dominance     Extremity/Trunk Assessment Upper Extremity Assessment Upper Extremity Assessment: Overall WFL for tasks assessed      Cervical / Trunk Assessment Cervical / Trunk Assessment: Normal   Communication Communication Communication: No difficulties   Cognition Arousal/Alertness: Awake/alert Behavior During Therapy: WFL for tasks assessed/performed Overall Cognitive Status: Within Functional Limits for tasks assessed                                     General Comments       Exercises     Shoulder Instructions      Home Living Family/patient expects to be  discharged to:: Private residence Living Arrangements: Spouse/significant other   Type of Home: House Home Access: Stairs to enter [employer] of Steps: 3 Entrance Stairs-Rails: Left Home Layout: Two level;Able to live on main level with bedroom/bathroom     Bathroom Shower/Tub: Occupational psychologist: Standard     Home Equipment: Wheelchair - manual;Bedside commode;Shower seat (cane for the blind)          Prior Functioning/Environment Level of Independence: Independent with assistive device(s)        Comments: uses cane for the blind PRN        OT Problem List:        OT Treatment/Interventions:      OT Goals(Current goals can be found in the care plan section)  Acute Rehab OT Goals Patient Stated Goal: home  OT Frequency:     Barriers to D/C:            Co-evaluation              AM-PAC PT "6 Clicks" Daily Activity     Outcome Measure Help from another person eating meals?: None Help from another person taking care of personal grooming?: None Help from another person toileting, which includes using toliet, bedpan, or urinal?: None Help from another person bathing (including washing, rinsing, drying)?: A Little Help from another person to put on and taking off regular upper body clothing?: None Help from another person to put on and taking off regular lower body clothing?: A Little 6 Click Score: 22   End of Session    Activity Tolerance: Patient tolerated treatment well Patient left: with bed alarm set;with family/visitor present (at EOB)  OT Visit Diagnosis: Muscle weakness (generalized) (M62.81)                Time: 1583-0940 OT Time Calculation (min): 16 min Charges:  OT General Charges $OT Visit: 1 Procedure OT Evaluation $OT Eval Low Complexity: 1 Procedure G-Codes:       Philippa Chester 08/08/2016, 12:16 PM

## 2016-08-09 LAB — BPAM RBC
BLOOD PRODUCT EXPIRATION DATE: 201806282359
BLOOD PRODUCT EXPIRATION DATE: 201807042359
ISSUE DATE / TIME: 201806231356
ISSUE DATE / TIME: 201806231710
UNIT TYPE AND RH: 6200
Unit Type and Rh: 6200

## 2016-08-09 LAB — TYPE AND SCREEN
ABO/RH(D): A POS
ANTIBODY SCREEN: NEGATIVE
UNIT DIVISION: 0
Unit division: 0

## 2016-08-09 LAB — GLUCOSE, CAPILLARY: GLUCOSE-CAPILLARY: 113 mg/dL — AB (ref 65–99)

## 2016-08-10 ENCOUNTER — Other Ambulatory Visit: Payer: Medicare Other

## 2016-08-10 ENCOUNTER — Ambulatory Visit: Payer: Medicare Other | Admitting: Internal Medicine

## 2016-08-16 ENCOUNTER — Telehealth: Payer: Self-pay

## 2016-08-16 MED ORDER — LACTULOSE 10 GM/15ML PO SOLN: ORAL | 1 refills | Status: AC

## 2016-08-16 NOTE — Telephone Encounter (Signed)
Reeves Forth RN with [facility] called requesting a larger bottle of lactulose. The rx he has only lasts him a week and he stretches it out by taking 1 dose per day. He C/O BMs every 3-4 days. Family is also going to refill his colace and start him on miralax.

## 2016-08-16 NOTE — Addendum Note (Signed)
Addended by: Lucile Crater on: 08/16/2016 03:08 PM   Modules accepted: Orders

## 2016-08-17 ENCOUNTER — Other Ambulatory Visit: Payer: Self-pay | Admitting: Internal Medicine

## 2016-08-20 ENCOUNTER — Other Ambulatory Visit: Payer: Self-pay | Admitting: Emergency Medicine

## 2016-08-20 MED ORDER — MORPHINE SULFATE ER 30 MG PO TBCR: 30.0000 mg | EXTENDED_RELEASE_TABLET | Freq: Two times a day (BID) | ORAL | 0 refills | Status: AC

## 2016-08-20 MED ORDER — OXYCODONE HCL 5 MG PO TABS: 5.0000 mg | ORAL_TABLET | Freq: Four times a day (QID) | ORAL | 0 refills | Status: AC | PRN

## 2016-08-23 ENCOUNTER — Telehealth: Payer: Self-pay | Admitting: *Deleted

## 2016-08-23 NOTE — Telephone Encounter (Signed)
Research Note for S1400 study; Notified wife that tissue specimens were sent off to the study on 08/10/16.  We expect to be notified by the study at the end of this week about whether or not patient is assigned to a sub study.  Dr. Julien Nordmann is aware and will schedule patient back to see him as soon as we hear from the study.  If patient assigned to sub study he will need more labs and also CT Chest and Head prior to enrolling on sub study.  This research nurse will be out of the office for the next week and one half.  Research Nurse, Merceda Elks, will cover patient during my absence.  Instructed wife to contact Dr. Worthy Flank nurse or triage nurse if any problems or concerns with patients condition prior to next appointment.  She verbalized understanding.   Foye Spurling, BSN, RN Clinical Research Nurse 08/23/2016 1:19 PM

## 2016-08-24 ENCOUNTER — Other Ambulatory Visit: Payer: Self-pay | Admitting: Internal Medicine

## 2016-08-24 DIAGNOSIS — C3492 Malignant neoplasm of unspecified part of left bronchus or lung: Secondary | ICD-10-CM

## 2016-08-26 ENCOUNTER — Telehealth: Payer: Self-pay | Admitting: Medical Oncology

## 2016-08-26 NOTE — Telephone Encounter (Signed)
[facility] nurse reports pt has flank pain and sediment and ? Blood in urine. Can she obtain a UA ,c&S.

## 2016-08-27 ENCOUNTER — Emergency Department (HOSPITAL_COMMUNITY): Payer: Medicare Other

## 2016-08-27 ENCOUNTER — Encounter (HOSPITAL_COMMUNITY): Payer: Self-pay | Admitting: Emergency Medicine

## 2016-08-27 ENCOUNTER — Inpatient Hospital Stay (HOSPITAL_COMMUNITY)
Admission: EM | Admit: 2016-08-27 | Discharge: 2016-08-31 | DRG: 872 | Disposition: A | Discharge status: Home / Self Care | Payer: Medicare Other | Attending: Internal Medicine | Admitting: Internal Medicine

## 2016-08-27 ENCOUNTER — Telehealth: Payer: Self-pay | Admitting: *Deleted

## 2016-08-27 DIAGNOSIS — E860 Dehydration: Secondary | ICD-10-CM | POA: Diagnosis present

## 2016-08-27 DIAGNOSIS — N183 Chronic kidney disease, stage 3 unspecified: Secondary | ICD-10-CM | POA: Diagnosis present

## 2016-08-27 DIAGNOSIS — Z794 Long term (current) use of insulin: Secondary | ICD-10-CM

## 2016-08-27 DIAGNOSIS — N179 Acute kidney failure, unspecified: Secondary | ICD-10-CM | POA: Diagnosis present

## 2016-08-27 DIAGNOSIS — E1122 Type 2 diabetes mellitus with diabetic chronic kidney disease: Secondary | ICD-10-CM | POA: Diagnosis present

## 2016-08-27 DIAGNOSIS — Z833 Family history of diabetes mellitus: Secondary | ICD-10-CM

## 2016-08-27 DIAGNOSIS — D72829 Elevated white blood cell count, unspecified: Secondary | ICD-10-CM | POA: Diagnosis not present

## 2016-08-27 DIAGNOSIS — Z9221 Personal history of antineoplastic chemotherapy: Secondary | ICD-10-CM

## 2016-08-27 DIAGNOSIS — E1139 Type 2 diabetes mellitus with other diabetic ophthalmic complication: Secondary | ICD-10-CM | POA: Diagnosis not present

## 2016-08-27 DIAGNOSIS — I509 Heart failure, unspecified: Secondary | ICD-10-CM | POA: Diagnosis present

## 2016-08-27 DIAGNOSIS — Z720 Tobacco use: Secondary | ICD-10-CM | POA: Diagnosis not present

## 2016-08-27 DIAGNOSIS — A419 Sepsis, unspecified organism: Secondary | ICD-10-CM | POA: Diagnosis not present

## 2016-08-27 DIAGNOSIS — I10 Essential (primary) hypertension: Secondary | ICD-10-CM | POA: Diagnosis present

## 2016-08-27 DIAGNOSIS — R109 Unspecified abdominal pain: Secondary | ICD-10-CM | POA: Diagnosis present

## 2016-08-27 DIAGNOSIS — E875 Hyperkalemia: Secondary | ICD-10-CM

## 2016-08-27 DIAGNOSIS — Z79899 Other long term (current) drug therapy: Secondary | ICD-10-CM

## 2016-08-27 DIAGNOSIS — F1010 Alcohol abuse, uncomplicated: Secondary | ICD-10-CM | POA: Diagnosis present

## 2016-08-27 DIAGNOSIS — R0682 Tachypnea, not elsewhere classified: Secondary | ICD-10-CM | POA: Diagnosis present

## 2016-08-27 DIAGNOSIS — R0602 Shortness of breath: Secondary | ICD-10-CM

## 2016-08-27 DIAGNOSIS — R1084 Generalized abdominal pain: Secondary | ICD-10-CM | POA: Diagnosis not present

## 2016-08-27 DIAGNOSIS — E785 Hyperlipidemia, unspecified: Secondary | ICD-10-CM | POA: Diagnosis present

## 2016-08-27 DIAGNOSIS — F1410 Cocaine abuse, uncomplicated: Secondary | ICD-10-CM | POA: Diagnosis not present

## 2016-08-27 DIAGNOSIS — Z7982 Long term (current) use of aspirin: Secondary | ICD-10-CM

## 2016-08-27 DIAGNOSIS — I130 Hypertensive heart and chronic kidney disease with heart failure and stage 1 through stage 4 chronic kidney disease, or unspecified chronic kidney disease: Secondary | ICD-10-CM | POA: Diagnosis present

## 2016-08-27 DIAGNOSIS — E1142 Type 2 diabetes mellitus with diabetic polyneuropathy: Secondary | ICD-10-CM | POA: Diagnosis present

## 2016-08-27 DIAGNOSIS — R188 Other ascites: Secondary | ICD-10-CM | POA: Diagnosis present

## 2016-08-27 DIAGNOSIS — Z8249 Family history of ischemic heart disease and other diseases of the circulatory system: Secondary | ICD-10-CM

## 2016-08-27 DIAGNOSIS — R180 Malignant ascites: Secondary | ICD-10-CM

## 2016-08-27 DIAGNOSIS — Z820 Family history of epilepsy and other diseases of the nervous system: Secondary | ICD-10-CM

## 2016-08-27 DIAGNOSIS — C3492 Malignant neoplasm of unspecified part of left bronchus or lung: Secondary | ICD-10-CM | POA: Diagnosis not present

## 2016-08-27 DIAGNOSIS — C787 Secondary malignant neoplasm of liver and intrahepatic bile duct: Secondary | ICD-10-CM | POA: Diagnosis present

## 2016-08-27 DIAGNOSIS — R6510 Systemic inflammatory response syndrome (SIRS) of non-infectious origin without acute organ dysfunction: Secondary | ICD-10-CM | POA: Diagnosis not present

## 2016-08-27 DIAGNOSIS — H548 Legal blindness, as defined in USA: Secondary | ICD-10-CM | POA: Diagnosis present

## 2016-08-27 DIAGNOSIS — Z66 Do not resuscitate: Secondary | ICD-10-CM | POA: Diagnosis present

## 2016-08-27 DIAGNOSIS — Z9842 Cataract extraction status, left eye: Secondary | ICD-10-CM

## 2016-08-27 DIAGNOSIS — N184 Chronic kidney disease, stage 4 (severe): Secondary | ICD-10-CM | POA: Diagnosis present

## 2016-08-27 DIAGNOSIS — Z515 Encounter for palliative care: Secondary | ICD-10-CM | POA: Diagnosis present

## 2016-08-27 DIAGNOSIS — R079 Chest pain, unspecified: Secondary | ICD-10-CM | POA: Diagnosis present

## 2016-08-27 DIAGNOSIS — Z9841 Cataract extraction status, right eye: Secondary | ICD-10-CM

## 2016-08-27 DIAGNOSIS — Z809 Family history of malignant neoplasm, unspecified: Secondary | ICD-10-CM

## 2016-08-27 DIAGNOSIS — R000 Tachycardia, unspecified: Secondary | ICD-10-CM | POA: Diagnosis present

## 2016-08-27 LAB — BASIC METABOLIC PANEL
Anion gap: 13 (ref 5–15)
BUN: 39 mg/dL — AB (ref 6–20)
CHLORIDE: 98 mmol/L — AB (ref 101–111)
CO2: 23 mmol/L (ref 22–32)
Calcium: 10.2 mg/dL (ref 8.9–10.3)
Creatinine, Ser: 2.35 mg/dL — ABNORMAL HIGH (ref 0.61–1.24)
GFR calc Af Amer: 34 mL/min — ABNORMAL LOW (ref >60)
GFR calc non Af Amer: 29 mL/min — ABNORMAL LOW (ref >60)
GLUCOSE: 110 mg/dL — AB (ref 65–99)
POTASSIUM: 6 mmol/L — AB (ref 3.5–5.1)
Sodium: 134 mmol/L — ABNORMAL LOW (ref 135–145)

## 2016-08-27 LAB — URINALYSIS, ROUTINE W REFLEX MICROSCOPIC
BILIRUBIN URINE: NEGATIVE
Glucose, UA: NEGATIVE mg/dL
Hgb urine dipstick: NEGATIVE
KETONES UR: NEGATIVE mg/dL
Leukocytes, UA: NEGATIVE
Nitrite: NEGATIVE
PH: 5 (ref 5.0–8.0)
Protein, ur: 30 mg/dL — AB
Specific Gravity, Urine: 1.019 (ref 1.005–1.030)

## 2016-08-27 LAB — CBC
HEMATOCRIT: 29.8 % — AB (ref 39.0–52.0)
Hemoglobin: 10.2 g/dL — ABNORMAL LOW (ref 13.0–17.0)
MCH: 22.6 pg — AB (ref 26.0–34.0)
MCHC: 34.2 g/dL (ref 30.0–36.0)
MCV: 65.9 fL — AB (ref 78.0–100.0)
Platelets: 356 10*3/uL (ref 150–400)
RBC: 4.52 MIL/uL (ref 4.22–5.81)
RDW: 25.6 % — AB (ref 11.5–15.5)
WBC: 49.8 10*3/uL — ABNORMAL HIGH (ref 4.0–10.5)

## 2016-08-27 LAB — I-STAT TROPONIN, ED: Troponin i, poc: 0 ng/mL (ref 0.00–0.08)

## 2016-08-27 MED ORDER — SODIUM POLYSTYRENE SULFONATE 15 GM/60ML PO SUSP
45.0000 g | Freq: Once | ORAL | Status: AC
Start: 2016-08-28 — End: 2016-08-28
  Administered 2016-08-28: 45 g via ORAL
  Filled 2016-08-27: qty 180

## 2016-08-27 MED ORDER — INSULIN ASPART 100 UNIT/ML IV SOLN
5.0000 [IU] | Freq: Once | INTRAVENOUS | Status: AC
  Administered 2016-08-28: 5 [IU] via INTRAVENOUS
  Filled 2016-08-27: qty 0.05

## 2016-08-27 MED ORDER — SODIUM CHLORIDE 0.9 % IV BOLUS (SEPSIS)
1000.0000 mL | Freq: Once | INTRAVENOUS | Status: AC
  Administered 2016-08-27: 1000 mL via INTRAVENOUS

## 2016-08-27 MED ORDER — IOPAMIDOL (ISOVUE-300) INJECTION 61%
30.0000 mL | Freq: Once | INTRAVENOUS | Status: AC | PRN
  Administered 2016-08-27: 30 mL via ORAL

## 2016-08-27 MED ORDER — ALBUTEROL SULFATE (2.5 MG/3ML) 0.083% IN NEBU
5.0000 mg | INHALATION_SOLUTION | Freq: Once | RESPIRATORY_TRACT | Status: AC
  Administered 2016-08-27: 5 mg via RESPIRATORY_TRACT
  Filled 2016-08-27: qty 6

## 2016-08-27 MED ORDER — DEXTROSE 50 % IV SOLN
50.0000 mL | Freq: Once | INTRAVENOUS | Status: AC
  Administered 2016-08-28: 50 mL via INTRAVENOUS
  Filled 2016-08-27: qty 50

## 2016-08-27 MED ORDER — FENTANYL CITRATE (PF) 100 MCG/2ML IJ SOLN
50.0000 ug | Freq: Once | INTRAMUSCULAR | Status: AC
  Administered 2016-08-27: 50 ug via INTRAVENOUS
  Filled 2016-08-27: qty 2

## 2016-08-27 MED ORDER — HYDROMORPHONE HCL 1 MG/ML IJ SOLN
1.0000 mg | Freq: Once | INTRAMUSCULAR | Status: AC
  Administered 2016-08-27: 1 mg via INTRAVENOUS
  Filled 2016-08-27: qty 1

## 2016-08-27 MED ORDER — IOPAMIDOL (ISOVUE-300) INJECTION 61%
INTRAVENOUS | Status: AC
  Administered 2016-08-27: 30 mL via ORAL
  Filled 2016-08-27: qty 30

## 2016-08-27 NOTE — ED Provider Notes (Signed)
[facility] DEPT Provider Note   CSN: [id number] Arrival date & time: 08/27/16  1657     History   Chief Complaint Chief Complaint  Patient presents with  . Back Pain  . Abdominal Pain    HPI Keith Holloway is a 56 y.o. male.  HPI  Patient presents with concern of abdominal pain, fatigue. Patient has multiple medical issues, most prominently stage IV lung cancer, for which he has completed chemotherapy, and is currently being evaluated for possible enrollment in a clinical trial. Patient had a biopsy performed in his liver 3 weeks ago. He notes that he has had increasing pain in this area, particularly over the past few days. There is associated generalized weakness, as well as new urinary changes, including difficulty with urination, and blood in the urine. No report of new fever, vomiting. There is anorexia. Patient is here with his wife who assists with the history of present illness.     Past Medical History:  Diagnosis Date  . Acute hyperglycemia 11/26/2015  . Alcohol abuse   . Arthritis   . Blind   . Borderline hypertension   . CKD (chronic kidney disease), stage III   . Cocaine abuse    last [employer] post[employer] for cocaine 09-30-2015  . Depression   . Dyspnea    with exertion   . Dyspnea on effort   . Elevated blood sugar 11/26/2015  . Goals of care, counseling/discussion 03/09/2016  . History of acute renal failure    09-30-2015  . History of blood transfusion   . History of hyperkalemia    admission 09-30-2015 potassiom 8.0  with medications currently 10-14-2015 down to 4.7 normal  . History of traumatic head injury    1987  w/ LOC   --- no residual  . Hyperglycemia 02/17/2016  . Hyperlipidemia   . Hypertension 12/16/2015  . 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 Memory disorder 08/22/2013   alcohol abuse  . Migraine   . Nocturia   . 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 Pilonidal abscess    recurrent  . Pleural effusion, bilateral    per ct 10-09-2015  new small right effusion and left small to moderate effusion , increased  . Polyneuropathy in diabetes(357.2)   . Port-a-cath in place   . Pulmonary nodules/lesions, multiple    pt has stage 4 lung cancer  . Recurrent left pleural effusion 01/06/2016  . Tobacco abuse counseling 10/09/2014  . Type 2 diabetes mellitus with insulin therapy ([place])   . Wears dentures    upper    Patient Active Problem List   Diagnosis Date Noted  . History of lung cancer   . Acute kidney injury superimposed on CKD ([place]) 08/06/2016  . Type 2 diabetes mellitus with insulin therapy ([place]) 08/06/2016  . Anemia 08/06/2016  . Tobacco abuse disorder 08/06/2016  . Goals of care, counseling/discussion 03/09/2016  . Hyperglycemia 02/17/2016  . Recurrent left pleural effusion 01/06/2016  . Hypertension 12/16/2015  . Acute hyperglycemia 11/26/2015  . Elevated blood sugar 11/26/2015  . Non-small cell carcinoma of left lung, stage 4 ([place]) 11/18/2015  . CHF (congestive heart failure) ([place]) 11/18/2015  .  Cocaine abuse 11/18/2015  . Acute kidney injury ([place]) 09/30/2015  . SIRS (systemic inflammatory response syndrome) ([place]) 09/30/2015  . Hyperkalemia 09/23/2015  . Skin infection 06/10/2015  . Insomnia 05/20/2015  . Dehydration 04/29/2015  . Encounter for antineoplastic immunotherapy 02/18/2015  . Chronic renal insufficiency 01/23/2015  . Tobacco abuse counseling 10/09/2014  . Encounter for antineoplastic chemotherapy 08/28/2014  . Non-small cell carcinoma of lung, stage 4 ([place]) 06/13/2014  . Other specified diabetes mellitus without complications ([place]) 02/40/9735  . Essential hypertension, benign  01/23/2014  . Memory disorder 08/22/2013  . Alcohol abuse 07/31/2013  . Diabetes mellitus with ophthalmic complication ([place]) 32/99/2426  . Hyperlipidemia 04/14/2006    Past Surgical History:  Procedure Laterality Date  . CATARACT EXTRACTION, BILATERAL  08/2012  . PILONIDAL CYST EXCISION N/A 10/17/2015   Procedure: EXCISION PILONIDAL DISEASE;  Surgeon: Leighton Ruff, MD;  Location: [facility];  Service: General;  Laterality: N/A;  . PORTACATH PLACEMENT  01/30/2015  . RETINAL DETACHMENT SURGERY Bilateral 04/2012       Home Medications    Prior to Admission medications   Medication Sig Start Date End Date Taking? Authorizing Provider  acetaminophen (TYLENOL) 500 MG tablet Take 500 mg by mouth every 6 (six) hours as needed for moderate pain.   Yes [provider]  amLODipine (NORVASC) 10 MG tablet Take 1 tablet (10 mg total) by mouth daily. 11/19/15  Yes Barton Dubois, MD  aspirin EC 81 MG tablet Take 1 tablet by mouth every morning.    Yes [provider]  Cholecalciferol (VITAMIN D-3) 1000 units CAPS Take 1,000 Units by mouth daily.   Yes [provider]  docusate sodium (COLACE) 100 MG capsule Take 1 capsule (100 mg total) by mouth 2 (two) times daily. 11/19/15  Yes Barton Dubois, MD  FeFum-FePoly-FA-B Cmp-C-Biot (INTEGRA PLUS) CAPS Take 1 capsule by mouth every morning. 07/26/16  Yes Curt Bears, MD  ferrous sulfate 325 (65 FE) MG tablet Take 325 mg by mouth daily with breakfast.    Yes [provider]  insulin NPH-regular Human (NOVOLIN 70/30) (70-30) 100 UNIT/ML injection Inject 10 Units into the skin 2 (two) times daily with a meal. 08/08/16  Yes Rosita Fire, MD  lactulose [facility]) 10 GM/15ML solution TAKE 30 MLS TWICE A DAY 08/16/16  Yes Curt Bears, MD  lidocaine-prilocaine (EMLA) cream APPLY 1 APPLICATION TOPICALLY AS NEEDED. 02/17/16  Yes Curt Bears, MD  morphine (MS CONTIN) 30 MG 12 hr tablet Take 1  tablet (30 mg total) by mouth every 12 (twelve) hours. 08/20/16  Yes Ladell Pier, MD  nicotine (NICODERM CQ) 14 mg/24hr patch Place 1 patch (14 mg total) onto the skin daily. 07/28/16  Yes Curt Bears, MD  ondansetron (ZOFRAN) 8 MG tablet TAKE 1 TABLET EVERY 8 HOURS AS NEEDED FOR NAUSEA AND VOMITING 08/25/16  Yes Curt Bears, MD  oxyCODONE (OXY IR/ROXICODONE) 5 MG immediate release tablet Take 1 tablet (5 mg total) by mouth every 6 (six) hours as needed for severe pain. 08/20/16 10/19/16 Yes Ladell Pier, MD  simvastatin (ZOCOR) 40 MG tablet Take 1 tablet (40 mg total) by mouth daily. Patient taking differently: Take 40 mg by mouth every morning.  01/23/14  Yes Advani, Vernon Prey, MD  tamsulosin (FLOMAX) 0.4 MG CAPS capsule Take 1 capsule (0.4 mg total) by mouth daily. 08/09/16  Yes Rosita Fire, MD  vitamin C (ASCORBIC ACID) 500 MG tablet Take 500 mg by mouth 2 (two) times daily.  Yes [provider]  acetaminophen (TYLENOL) 325 MG tablet Take 2 tablets (650 mg total) by mouth every 6 (six) hours as needed for mild pain (or Fever >/= 101). Patient not taking: Reported on 08/06/2016 10/02/15   Lavina Hamman, MD  lactulose [facility]) 10 GM/15ML solution TAKE 30 MLS TWICE A DAY Patient not taking: Reported on 08/27/2016 08/17/16   Curt Bears, MD    Family History Family History  Problem Relation Age of Onset  . Alzheimer's disease Brother   . Cancer Father   . Hypertension Father   . Heart attack Father   . Hypertension Mother   . Gout Mother   . Diabetes Sister   . Hypertension Sister   . Hypertension Sister   . Hypertension Sister     Social History Social History  Substance Use Topics  . Smoking status: Former Smoker    Packs/day: 0.25    Years: 32.00    Types: Cigarettes    Quit date: 10/01/2015  . Smokeless tobacco: Never Used     Comment: recently stopped smoking uses nicoderm patch  . Alcohol use Yes     Comment: alcoholic     Allergies     Patient has no known allergies.   Review of Systems Review of Systems  Constitutional:       Per HPI, otherwise negative  HENT:       Per HPI, otherwise negative  Respiratory:       Per HPI, otherwise negative  Cardiovascular:       Per HPI, otherwise negative  Gastrointestinal: Positive for abdominal pain and nausea. Negative for vomiting.  Endocrine:       Negative aside from HPI  Genitourinary:       Neg aside from HPI   Musculoskeletal:       Per HPI, otherwise negative  Skin: Negative.   Allergic/Immunologic: Positive for immunocompromised state.  Neurological: Positive for weakness. Negative for syncope.     Physical Exam Updated Vital Signs BP (!) 146/91   Pulse (!) 122   Temp 98.9 F (37.2 C) (Oral)   Resp (!) 33   SpO2 95%   Physical Exam  Constitutional: He is oriented to person, place, and time. He has a sickly appearance. No distress.  HENT:  Head: Normocephalic and atraumatic.  Eyes: Conjunctivae and EOM are normal.  Cardiovascular: Normal rate and regular rhythm.   Pulmonary/Chest: Effort normal. No stridor. No respiratory distress.    Abdominal: He exhibits no distension. There is tenderness.    Musculoskeletal: He exhibits no edema.  Neurological: He is alert and oriented to person, place, and time. He displays atrophy. He displays no tremor. He exhibits normal muscle tone. He displays no seizure activity. Coordination normal.  Skin: Skin is warm and dry.  Psychiatric: He has a normal mood and affect.  Nursing note and vitals reviewed.    ED Treatments / Results  Labs (all labs ordered are listed, but only abnormal results are displayed) Labs Reviewed  BASIC METABOLIC PANEL - Abnormal; Notable for the following:       Result Value   Sodium 134 (*)    Potassium 6.0 (*)    Chloride 98 (*)    Glucose, Bld 110 (*)    BUN 39 (*)    Creatinine, Ser 2.35 (*)    GFR calc non Af Amer 29 (*)    GFR calc Af Amer 34 (*)    All other  components within normal limits  CBC - Abnormal; Notable for the following:    WBC 49.8 (*)    Hemoglobin 10.2 (*)    HCT 29.8 (*)    MCV 65.9 (*)    MCH 22.6 (*)    RDW 25.6 (*)    All other components within normal limits  URINALYSIS, ROUTINE W REFLEX MICROSCOPIC - Abnormal; Notable for the following:    Color, Urine AMBER (*)    APPearance HAZY (*)    Protein, ur 30 (*)    Bacteria, UA RARE (*)    Squamous Epithelial / LPF 0-5 (*)    All other components within normal limits  I-STAT TROPOININ, ED    EKG  EKG Interpretation  Date/Time:  Friday August 27 2016 18:25:32 EDT Ventricular Rate:  120 PR Interval:    QRS Duration: 71 QT Interval:  298 QTC Calculation: 421 R Axis:   -32 Text Interpretation:  Sinus tachycardia Left axis deviation Abnormal R-wave progression, early transition Baseline wander in lead(s) V5 Abnormal ekg Confirmed by Carmin Muskrat [phone]) on 08/27/2016 7:05:18 PM       Radiology Ct Abdomen Pelvis Wo Contrast  Result Date: 08/27/2016 CLINICAL DATA:  56 year old male with abdominal pain. History of left lung cancer. EXAM: CT ABDOMEN AND PELVIS WITHOUT CONTRAST TECHNIQUE: Multidetector CT imaging of the abdomen and pelvis was performed following the standard protocol without IV contrast. COMPARISON:  Abdominal CT dated 05/06/2016 FINDINGS: Evaluation of this exam is limited in the absence of intravenous contrast. Lower chest: There is a small left pleural effusion. There is a 2.1 x 1.7 cm lesion in the superior segment of the left lower lobe abutting the fissure which is increased in size compared the prior CT (previously measuring 1.1 x 1.0 cm). A 1.2 x 0.9 cm nodule at the left lung base appears new or increased compared to prior CT. Multiple small nodular density along the minor fissure measure up to 5 mm and appear new compared to the prior CT. There is a 3 mm nodule at the right lung base (series 3, image 6). Partially visualized bilateral hilar adenopathy.  Subcarinal and mediastinal adenopathy. Partially visualized bilobed masses along the left pericardium measure 5.5 x 2.2 cm. There are bibasilar streaky densities, likely atelectasis, although infiltrate is not excluded. No intra-abdominal free air. Small ascites, new compared to the prior CT. Hepatobiliary: The liver is enlarged. There are multiple ill-defined hypoechoic liver masses consistent with metastatic disease. There has been significant interval increase in the size of the left lobe of the liver compared to the prior CT. No intrahepatic biliary dilatation. The gallbladder is contracted. Pancreas: The pancreas is unremarkable. Spleen: Normal in size without focal abnormality. Adrenals/Urinary Tract: The kidneys, visualized ureters, and urinary bladder appear unremarkable. Stomach/Bowel: No evidence of bowel obstruction. The appendix is not identified with certainty. Vascular/Lymphatic: There is mild aortoiliac atherosclerotic disease. Evaluation of the abdominal aorta and IVC is limited in the absence of intravenous contrast. No portal venous gas identified. No adenopathy. Reproductive: The prostate and seminal vesicles are grossly unremarkable. Other: Mild diffuse subcutaneous edema and anasarca. No fluid collection. Musculoskeletal: No acute or significant osseous findings. Partially visualized sclerotic changes of the sternum as seen on the prior CT may be related to radiation changes. IMPRESSION: 1. Interval progression of metastatic disease with enlargement of the pulmonary nodules. 2. Partially visualized small left pleural effusion with no significant interval change compared to prior study. 3. Hilar and mediastinal adenopathy is, partially visualized, grossly unchanged. 4. Significant interval enlargement of  the liver compared to the prior CT with overall progression of metastatic disease. Evaluation of the lesions however are limited in the absence of intravenous contrast. 5. Small ascites, new  compared to the prior CT. 6. No bowel obstruction. Electronically Signed   By: Anner Crete M.D.   On: 08/27/2016 22:37   Dg Chest 2 View  Result Date: 08/27/2016 CLINICAL DATA:  Cough, mid chest pain worse today, new abdominal distension, hematuria, history lung cancer (NSCLC stage IV), type II diabetes mellitus, CHF, chronic kidney disease, essential benign hypertension EXAM: CHEST  2 VIEW COMPARISON:  CT chest 05/07/2013 FINDINGS: RIGHT jugular Port-A-Cath with tip projecting over cavoatrial junction. Normal heart size and pulmonary vascularity. Abnormal density projecting at AP window/hilum on AP view, by prior CT in anterior LEFT upper lobe/mediastinum. Bibasilar atelectasis. Rounded opacity at LEFT lung base question metastasis 20 x 20 mm in size. No definite infiltrate, pleural effusion, or pneumothorax. No acute osseous findings. IMPRESSION: Bibasilar atelectasis with 20 x 20 mm nodular density at LEFT lung base question pulmonary metastasis. Known LEFT upper lobe/mediastinal tumor. Electronically Signed   By: Lavonia Dana M.D.   On: 08/27/2016 18:25    Procedures Procedures (including critical care time)  Medications Ordered in ED Medications  albuterol (PROVENTIL) (2.5 MG/3ML) 0.083% nebulizer solution 5 mg (not administered)  HYDROmorphone (DILAUDID) injection 1 mg (not administered)  fentaNYL (SUBLIMAZE) injection 50 mcg (50 mcg Intravenous Given 08/27/16 1937)  iopamidol (ISOVUE-300) 61 % injection 30 mL (30 mLs Oral Contrast Given 08/27/16 2057)  sodium chloride 0.9 % bolus 1,000 mL (1,000 mLs Intravenous New Bag/Given 08/27/16 2125)     Initial Impression / Assessment and Plan / ED Course  I have reviewed the triage vital signs and the nursing notes.  Pertinent labs & imaging results that were available during my care of the patient were reviewed by me and considered in my medical decision making (see chart for details).  11:22 PM Patient awake and alert, but continues to  complain of pain. I reviewed the lab findings, CT findings with patient and his wife. Notably, the patient has hyperkalemia, as well as acute kidney injury, with a creatinine increased over the past 2 weeks from one to 2.35. No notable EKG changes but given the substantial abnormality the patient has received IV fluids, as well as albuterol to facilitate potassium clearance. Patient also has no leukocytosis, but no evidence for infection, this may be reactive given the patient's cancer burden. CT scan demonstrating increased progression of his metastatic disease. Given the patient's new acute kidney injury, hyperkalemia, pain, patient was admitted for further evaluation and management.  Final Clinical Impressions(s) / ED Diagnoses   Final diagnoses:  Abdominal pain  Hyperkalemia Acute kidney injury   Carmin Muskrat, MD 08/27/16 2324

## 2016-08-27 NOTE — H&P (Signed)
History and Physical    Keith Holloway IRS:[id number] DOB: [birth year] DOA: 08/27/2016  Referring MD/NP/PA:   PCP: [name], MD   Patient coming from:  The patient is coming from home.  At baseline, pt is partially dependent for most of ADL.  Chief Complaint: Chest pain, shortness breath, abdominal pain, nausea, vomiting  HPI: Keith Holloway is a 56 y.o. 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  Patient states that he has been having worsening abdominal pain, abdominal distention, nausea and vomiting for about 1 week. No diarrhea. He vomited twice today, without blood in the vomitus. Patient also has chest pain. It is located in bilateral sides of chest, constant, 10 out of 10 in severity, sharp, pleuritic, nonradiating. It is aggravated by deep breath. It is associated with SOB. Denies cough, fever or chills. Denies tenderness. Patient denies symptoms of UTI or unilateral weakness.  ED Course: pt was found to have WBC 39.8, lactate of 4.2, negative troponin, negative urinalysis, potassium 6.0 without T-wave peaking, worsening renal function, temperature 99.1, tachycardia, tachypnea, O2 sat 90-95% on 2 L nasal cannula oxygen. Patient is admitted to telemetry bed as inpatient.  CT abdomen/pelvis:  1. Interval progression of metastatic disease with enlargement of the pulmonary nodules. 2. Partially visualized small left pleural effusion with no significant interval change compared to prior study. 3. Hilar and mediastinal adenopathy is, partially visualized, grossly unchanged. 4. Significant interval enlargement of the liver compared to the prior CT with overall progression of metastatic disease. Evaluation of the  lesions however are limited in the absence of intravenous contrast. 5. Small ascites, new compared to the prior CT. 6. No bowel obstruction.  Review of Systems:   General: no subjective fevers, chills, no changes in body weight, has poor appetite, has fatigue HEENT: no blurry vision, hearing changes or sore throat Respiratory: has dyspnea, no coughing, wheezing CV: has chest pain, no palpitations GI: no nausea, vomiting, abdominal pain, no diarrhea, constipation GU: no dysuria, burning on urination, increased urinary frequency, hematuria  Ext: no leg edema Neuro: no unilateral weakness, numbness, or tingling, no vision change or hearing loss Skin: no rash, no skin tear. MSK: No muscle spasm, no deformity, no limitation of range of movement in spin Heme: No easy bruising.  Travel history: No recent long distant travel.  Allergy: No Known Allergies  Past Medical History:  Diagnosis Date  . Acute hyperglycemia 11/26/2015  . Alcohol abuse   . Arthritis   . Blind   . Borderline hypertension   . CKD (chronic kidney disease), stage III   . Cocaine abuse    last [employer] post[employer] for cocaine 09-30-2015  . Depression   . Dyspnea    with exertion   . Dyspnea on effort   . Elevated blood sugar 11/26/2015  . Goals of care, counseling/discussion 03/09/2016  . History of acute renal failure    09-30-2015  . History of blood transfusion   . History of hyperkalemia    admission 09-30-2015 potassiom 8.0  with medications currently 10-14-2015 down to 4.7 normal  . History of traumatic head injury    1987  w/ LOC   --- no residual  . Hyperglycemia 02/17/2016  . Hyperlipidemia   . Hypertension 12/16/2015  . 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 Memory disorder 08/22/2013   alcohol abuse  . Migraine   . Nocturia   . 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 Pilonidal abscess    recurrent  . Pleural effusion, bilateral    per ct 10-09-2015  new small right effusion and left small to moderate effusion , increased  . Polyneuropathy in diabetes(357.2)   . Port-a-cath in place   . Pulmonary nodules/lesions, multiple    pt has stage 4 lung cancer  . Recurrent left pleural effusion 01/06/2016  . Tobacco abuse counseling 10/09/2014  . Type 2 diabetes mellitus with insulin therapy ([place])   . Wears dentures    upper    Past Surgical History:  Procedure Laterality Date  . CATARACT EXTRACTION, BILATERAL  08/2012  . PILONIDAL CYST EXCISION N/A 10/17/2015   Procedure: EXCISION PILONIDAL DISEASE;  Surgeon: Leighton Ruff, MD;  Location: [facility];  Service: General;  Laterality: N/A;  . PORTACATH PLACEMENT  01/30/2015  . RETINAL DETACHMENT SURGERY Bilateral 04/2012    Social History:  reports that he quit smoking about 10 months ago. His smoking use included Cigarettes. He has a 8.00 pack-year smoking history. He has never used smokeless tobacco. He reports that he drinks alcohol. He reports that he uses drugs, including Cocaine.  Family History:  Family History  Problem Relation Age of Onset  . Alzheimer's disease Brother   . Cancer Father   . Hypertension Father   . Heart attack Father   . Hypertension Mother   . Gout Mother   . Diabetes Sister   . Hypertension Sister   . Hypertension Sister   . Hypertension Sister      Prior to Admission medications   Medication Sig Start Date End Date Taking? Authorizing Provider  acetaminophen (TYLENOL) 500 MG tablet Take 500 mg by mouth every 6 (six) hours as needed for moderate pain.   Yes [provider]  amLODipine (NORVASC) 10 MG tablet Take 1 tablet (10 mg total) by mouth daily. 11/19/15  Yes Barton Dubois, MD  aspirin EC 81 MG tablet Take 1 tablet by mouth every morning.    Yes [provider]  Cholecalciferol (VITAMIN D-3) 1000 units CAPS Take 1,000 Units by mouth daily.   Yes [provider]  docusate sodium (COLACE) 100 MG capsule Take 1 capsule (100 mg total) by mouth 2 (two) times daily. 11/19/15  Yes Barton Dubois, MD  FeFum-FePoly-FA-B Cmp-C-Biot (INTEGRA PLUS) CAPS Take 1 capsule by mouth every morning. 07/26/16  Yes Curt Bears, MD  ferrous sulfate 325 (65 FE) MG tablet Take 325 mg by mouth daily with breakfast.    Yes [provider]  insulin NPH-regular Human (NOVOLIN 70/30) (70-30) 100 UNIT/ML injection Inject 10 Units into the skin 2 (two) times daily with a meal. 08/08/16  Yes Rosita Fire, MD  lactulose [facility]) 10 GM/15ML solution TAKE 30 MLS TWICE A DAY 08/16/16  Yes Curt Bears, MD  lidocaine-prilocaine (EMLA) cream APPLY 1 APPLICATION TOPICALLY AS NEEDED. 02/17/16  Yes Curt Bears, MD  morphine (MS CONTIN) 30 MG 12 hr tablet Take 1 tablet (30 mg total) by mouth every 12 (twelve) hours. 08/20/16  Yes Betsy Coder  B, MD  nicotine (NICODERM CQ) 14 mg/24hr patch Place 1 patch (14 mg total) onto the skin daily. 07/28/16  Yes Curt Bears, MD  ondansetron (ZOFRAN) 8 MG tablet TAKE 1 TABLET EVERY 8 HOURS AS NEEDED FOR NAUSEA AND VOMITING 08/25/16  Yes Curt Bears, MD  oxyCODONE (OXY IR/ROXICODONE) 5 MG immediate release tablet Take 1 tablet (5 mg total) by mouth every 6 (six) hours as needed for severe pain. 08/20/16 10/19/16 Yes Ladell Pier, MD  simvastatin (ZOCOR) 40 MG tablet Take 1 tablet (40 mg total) by mouth daily. Patient taking differently: Take 40 mg by mouth every morning.  01/23/14  Yes Advani, Vernon Prey, MD  tamsulosin (FLOMAX) 0.4 MG CAPS capsule Take 1 capsule (0.4 mg total) by mouth daily. 08/09/16  Yes Rosita Fire, MD  vitamin C (ASCORBIC ACID) 500 MG tablet Take 500 mg by mouth 2 (two) times daily.    Yes  [provider]  acetaminophen (TYLENOL) 325 MG tablet Take 2 tablets (650 mg total) by mouth every 6 (six) hours as needed for mild pain (or Fever >/= 101). Patient not taking: Reported on 08/06/2016 10/02/15   Lavina Hamman, MD  lactulose [facility]) 10 GM/15ML solution TAKE 30 MLS TWICE A DAY Patient not taking: Reported on 08/27/2016 08/17/16   Curt Bears, MD    Physical Exam: Vitals:   08/28/16 0100 08/28/16 0130 08/28/16 0139 08/28/16 0242  BP: 122/82 130/79 130/79 (!) 153/80  Pulse: (!) 130 (!) 123 (!) 119 (!) 126  Resp:   19 20  Temp:   99.1 F (37.3 C) 98.3 F (36.8 C)  TempSrc:   Oral Oral  SpO2: 98% 97% 98% 99%  Weight:    70.5 kg (155 lb 6.4 oz)  Height:    5\' 11"  (1.803 m)   General: Not in acute distress HEENT:       Eyes: blindness, no scleral icterus.       ENT: No discharge from the ears and nose, no pharynx injection, no tonsillar enlargement.        Neck: No JVD, no bruit, no mass felt. Heme: No neck lymph node enlargement. Cardiac: S1/S2, RRR, No murmurs, No gallops or rubs. Respiratory: No rales, wheezing, rhonchi or rubs. GI: Soft, mildly distended, diffusely tender, no rebound pain, no organomegaly, BS present. GU: No hematuria Ext: No pitting leg edema bilaterally. 2+DP/PT pulse bilaterally. Musculoskeletal: No joint deformities, No joint redness or warmth, no limitation of ROM in spin. Skin: No rashes.  Neuro: Alert, oriented X3, cranial nerves II-XII grossly intact, moves all extremities normally. Psych: Patient is not psychotic, no suicidal or hemocidal ideation.  Labs on Admission: I have personally reviewed following labs and imaging studies  CBC:  Recent Labs Lab 08/27/16 1929  WBC 49.8*  HGB 10.2*  HCT 29.8*  MCV 65.9*  PLT 470   Basic Metabolic Panel:  Recent Labs Lab 08/27/16 1929  NA 134*  K 6.0*  CL 98*  CO2 23  GLUCOSE 110*  BUN 39*  CREATININE 2.35*  CALCIUM 10.2   GFR: Estimated Creatinine Clearance: 35  mL/min (A) (by C-G formula based on SCr of 2.35 mg/dL (H)). Liver Function Tests: No results for input(s): AST, ALT, ALKPHOS, BILITOT, PROT, ALBUMIN in the last 168 hours. No results for input(s): LIPASE, AMYLASE in the last 168 hours. No results for input(s): AMMONIA in the last 168 hours. Coagulation Profile: No results for input(s): INR, PROTIME in the last 168 hours. Cardiac Enzymes: No results for  input(s): CKTOTAL, CKMB, CKMBINDEX, TROPONINI in the last 168 hours. BNP (last 3 results) No results for input(s): PROBNP in the last 8760 hours. HbA1C: No results for input(s): HGBA1C in the last 72 hours. CBG:  Recent Labs Lab 08/28/16 0248  GLUCAP 138*   Lipid Profile: No results for input(s): CHOL, HDL, LDLCALC, TRIG, CHOLHDL, LDLDIRECT in the last 72 hours. Thyroid Function Tests: No results for input(s): TSH, T4TOTAL, FREET4, T3FREE, THYROIDAB in the last 72 hours. Anemia Panel: No results for input(s): VITAMINB12, FOLATE, FERRITIN, TIBC, IRON, RETICCTPCT in the last 72 hours. Urine analysis:    Component Value Date/Time   COLORURINE AMBER (A) 08/27/2016 1756   APPEARANCEUR HAZY (A) 08/27/2016 1756   LABSPEC 1.019 08/27/2016 1756   PHURINE 5.0 08/27/2016 1756   GLUCOSEU NEGATIVE 08/27/2016 1756   HGBUR NEGATIVE 08/27/2016 1756   BILIRUBINUR NEGATIVE 08/27/2016 1756   KETONESUR NEGATIVE 08/27/2016 1756   PROTEINUR 30 (A) 08/27/2016 1756   UROBILINOGEN 0.2 05/12/2010 1656   NITRITE NEGATIVE 08/27/2016 1756   LEUKOCYTESUR NEGATIVE 08/27/2016 1756   Sepsis Labs: @LABRCNTIP (procalcitonin:4,lacticidven:4) )No results found for this or any previous visit (from the past 240 hour(s)).   Radiological Exams on Admission: Ct Abdomen Pelvis Wo Contrast  Result Date: 08/27/2016 CLINICAL DATA:  56 year old male with abdominal pain. History of left lung cancer. EXAM: CT ABDOMEN AND PELVIS WITHOUT CONTRAST TECHNIQUE: Multidetector CT imaging of the abdomen and pelvis was performed  following the standard protocol without IV contrast. COMPARISON:  Abdominal CT dated 05/06/2016 FINDINGS: Evaluation of this exam is limited in the absence of intravenous contrast. Lower chest: There is a small left pleural effusion. There is a 2.1 x 1.7 cm lesion in the superior segment of the left lower lobe abutting the fissure which is increased in size compared the prior CT (previously measuring 1.1 x 1.0 cm). A 1.2 x 0.9 cm nodule at the left lung base appears new or increased compared to prior CT. Multiple small nodular density along the minor fissure measure up to 5 mm and appear new compared to the prior CT. There is a 3 mm nodule at the right lung base (series 3, image 6). Partially visualized bilateral hilar adenopathy. Subcarinal and mediastinal adenopathy. Partially visualized bilobed masses along the left pericardium measure 5.5 x 2.2 cm. There are bibasilar streaky densities, likely atelectasis, although infiltrate is not excluded. No intra-abdominal free air. Small ascites, new compared to the prior CT. Hepatobiliary: The liver is enlarged. There are multiple ill-defined hypoechoic liver masses consistent with metastatic disease. There has been significant interval increase in the size of the left lobe of the liver compared to the prior CT. No intrahepatic biliary dilatation. The gallbladder is contracted. Pancreas: The pancreas is unremarkable. Spleen: Normal in size without focal abnormality. Adrenals/Urinary Tract: The kidneys, visualized ureters, and urinary bladder appear unremarkable. Stomach/Bowel: No evidence of bowel obstruction. The appendix is not identified with certainty. Vascular/Lymphatic: There is mild aortoiliac atherosclerotic disease. Evaluation of the abdominal aorta and IVC is limited in the absence of intravenous contrast. No portal venous gas identified. No adenopathy. Reproductive: The prostate and seminal vesicles are grossly unremarkable. Other: Mild diffuse subcutaneous  edema and anasarca. No fluid collection. Musculoskeletal: No acute or significant osseous findings. Partially visualized sclerotic changes of the sternum as seen on the prior CT may be related to radiation changes. IMPRESSION: 1. Interval progression of metastatic disease with enlargement of the pulmonary nodules. 2. Partially visualized small left pleural effusion with no significant interval change compared  to prior study. 3. Hilar and mediastinal adenopathy is, partially visualized, grossly unchanged. 4. Significant interval enlargement of the liver compared to the prior CT with overall progression of metastatic disease. Evaluation of the lesions however are limited in the absence of intravenous contrast. 5. Small ascites, new compared to the prior CT. 6. No bowel obstruction. Electronically Signed   By: Anner Crete M.D.   On: 08/27/2016 22:37   Dg Chest 2 View  Result Date: 08/27/2016 CLINICAL DATA:  Cough, mid chest pain worse today, new abdominal distension, hematuria, history lung cancer (NSCLC stage IV), type II diabetes mellitus, CHF, chronic kidney disease, essential benign hypertension EXAM: CHEST  2 VIEW COMPARISON:  CT chest 05/07/2013 FINDINGS: RIGHT jugular Port-A-Cath with tip projecting over cavoatrial junction. Normal heart size and pulmonary vascularity. Abnormal density projecting at AP window/hilum on AP view, by prior CT in anterior LEFT upper lobe/mediastinum. Bibasilar atelectasis. Rounded opacity at LEFT lung base question metastasis 20 x 20 mm in size. No definite infiltrate, pleural effusion, or pneumothorax. No acute osseous findings. IMPRESSION: Bibasilar atelectasis with 20 x 20 mm nodular density at LEFT lung base question pulmonary metastasis. Known LEFT upper lobe/mediastinal tumor. Electronically Signed   By: Lavonia Dana M.D.   On: 08/27/2016 18:25     EKG: Independently reviewed. Sinus rhythm, tachycardia, QTC 421, LAD, low voltage, no T-wave  peaking.   Assessment/Plan Principal Problem:   Chest pain Active Problems:   Diabetes mellitus with ophthalmic complication (HCC)   Alcohol abuse   Essential hypertension, benign   Hyperkalemia   Sepsis (HCC)   Non-small cell carcinoma of left lung, stage 4 (HCC)   Cocaine abuse   Tobacco abuse disorder   Acute renal failure superimposed on stage 3 chronic kidney disease (HCC)   Abdominal pain   Leukocytosis   Chest pain: Etiology is not clear. Potential differential diagnoses include PE given active lung cancer and ACS given his significant risk factors including hypertension, hyperlipidemia and diabetes. His NSCLC burden may also cause chest pain.  - will admit to tele bed as inpt - cycle CE q6 x3 and repeat EKG in the am  - prn Nitroglycerin, Morphine, and aspirin and zocor - Risk factor stratification: will check FLP, UDS and A1C  - 2d echo - Stat D-dimer, if positive, will get CTA to r/o PE  Sepsis vs. SIRS: Patient meets criteria for sepsis with leukocytosis with WBC 49.8, elevated lactic acid of 4.2, tachycardia, tachypnea. Currently hemodynamically stable. Source of infection is not clear. Urinalysis negative. Chest x-ray did not show obvious infiltration. -will start IV vanco and zosyn -f/u blood culture x 2 -will get Procalcitonin and trend lactic acid levels per sepsis protocol. -IVF: 2.5L of NS bolus in ED, followed by 100 cc/h   Abdominal pain: Most likely due to worsening metastasize to disease to liver, as shown in CT scan. Continue home MS Contin -When necessary oxycodone 10  morphine for pain -When necessary Zofran for nausea.  DM-II: Last A1c 7.1 omn 09/30/15, well controled. Patient is taking 70/30 insulin at home -will decrease 70/30 insulin dose from  10 to 7 units bid -SSI  HTN: -Continue amlodipine -IV hydralazine when necessary  Polysubstance abuse, including tobacco, cocaine, alcohol. Pt states that he did not drink alcohol recently  Tobacco  abuse and Alcohol abuse: -Did counseling about importance of quitting substance use -Nicotine patch -check UDS  Hyperkalemia: K=6.0, no EKG change -45 g of the Kayexalate -D50 plus NovoLog 5 units  Non-small  cell carcinoma of left lung, stage 4 ([place]): No surgery and no XRT. S/p of chemotherapy. F/u by Dr. Julien Nordmann -will Dr. Julien Nordmann to treatment team--please inform Dr. Julien Nordmann of the patient's admission in AM  AoCKD-III: Baseline Cre is 1.0-1.6, pt's Cre is A C5 on admission. Likely due to prerenal secondary to dehydration - IVF as above - Check FeNa - Follow up renal function by BMP    DVT ppx: SQ Heparin        Code Status: Full code Family Communication: Yes, patient's wife and sister at bed side Disposition Plan:  Anticipate discharge back to previous home environment Consults called:  none Admission status:  Inpatient/tele     Date of Service 08/28/2016    Ivor Costa [facility] Pager [phone]  If 7PM-7AM, please contact night-coverage [email or web address] Password [facility] 08/28/2016, 3:54 AM

## 2016-08-27 NOTE — ED Notes (Signed)
Patient transported to CT 

## 2016-08-27 NOTE — Telephone Encounter (Signed)
"  This is Keith Holloway with [facility].  This patient has been in pain for several days.  He says a liver biopsy report is pending.  Can he be seen today.  I know provider is out but what can be done in the office today.  Experiencing severe, uncontrolled right lower abdominal pain radiating to right lower back.  Abdomen is bloated/distended non-tender, rates pain as ten.  Takes oxycodone every six hours with tylenol in between but pain is still a ten.  Had good BM's Tuesday and Wednesday.  Takes stuff for constipation.  Not eating but forcing fluids and water.  Very nauseated despite using Zofran.  Vomited once this morning and once yesterday clear watery emesis.  Did not receive orders but his wife collected urine specimen this morning which looks very cloudy, amber reporting decreased urine output.  T = 97.5, P= 98, R = 18 to 20, B/P = 124/62.  He doesn't want to go to the hospital.        Collaborated with provider's nurse.  No new provider orders pending.  Instructed to go to the ED for further evaluation.

## 2016-08-27 NOTE — ED Triage Notes (Addendum)
Pt c/o worsening intermittent chest pain, right side pain, back pain, abdominal pain and tenderness x 1 week. Abdomen notably distended onset today, urine "milky" and red with sediment.

## 2016-08-27 NOTE — ED Notes (Signed)
Pt voided ~452mL and had 247mL residual

## 2016-08-28 ENCOUNTER — Inpatient Hospital Stay (HOSPITAL_COMMUNITY): Payer: Medicare Other

## 2016-08-28 DIAGNOSIS — I509 Heart failure, unspecified: Secondary | ICD-10-CM | POA: Diagnosis present

## 2016-08-28 DIAGNOSIS — I130 Hypertensive heart and chronic kidney disease with heart failure and stage 1 through stage 4 chronic kidney disease, or unspecified chronic kidney disease: Secondary | ICD-10-CM | POA: Diagnosis present

## 2016-08-28 DIAGNOSIS — I10 Essential (primary) hypertension: Secondary | ICD-10-CM | POA: Diagnosis not present

## 2016-08-28 DIAGNOSIS — R000 Tachycardia, unspecified: Secondary | ICD-10-CM | POA: Diagnosis present

## 2016-08-28 DIAGNOSIS — C3492 Malignant neoplasm of unspecified part of left bronchus or lung: Secondary | ICD-10-CM | POA: Diagnosis present

## 2016-08-28 DIAGNOSIS — Z7982 Long term (current) use of aspirin: Secondary | ICD-10-CM | POA: Diagnosis not present

## 2016-08-28 DIAGNOSIS — Z9841 Cataract extraction status, right eye: Secondary | ICD-10-CM | POA: Diagnosis not present

## 2016-08-28 DIAGNOSIS — A419 Sepsis, unspecified organism: Principal | ICD-10-CM

## 2016-08-28 DIAGNOSIS — Z833 Family history of diabetes mellitus: Secondary | ICD-10-CM | POA: Diagnosis not present

## 2016-08-28 DIAGNOSIS — E860 Dehydration: Secondary | ICD-10-CM | POA: Diagnosis present

## 2016-08-28 DIAGNOSIS — E1122 Type 2 diabetes mellitus with diabetic chronic kidney disease: Secondary | ICD-10-CM | POA: Diagnosis present

## 2016-08-28 DIAGNOSIS — Z720 Tobacco use: Secondary | ICD-10-CM

## 2016-08-28 DIAGNOSIS — H548 Legal blindness, as defined in USA: Secondary | ICD-10-CM | POA: Diagnosis present

## 2016-08-28 DIAGNOSIS — D72829 Elevated white blood cell count, unspecified: Secondary | ICD-10-CM | POA: Diagnosis present

## 2016-08-28 DIAGNOSIS — Z820 Family history of epilepsy and other diseases of the nervous system: Secondary | ICD-10-CM | POA: Diagnosis not present

## 2016-08-28 DIAGNOSIS — R079 Chest pain, unspecified: Secondary | ICD-10-CM | POA: Diagnosis not present

## 2016-08-28 DIAGNOSIS — R0602 Shortness of breath: Secondary | ICD-10-CM | POA: Diagnosis not present

## 2016-08-28 DIAGNOSIS — C787 Secondary malignant neoplasm of liver and intrahepatic bile duct: Secondary | ICD-10-CM | POA: Diagnosis present

## 2016-08-28 DIAGNOSIS — E875 Hyperkalemia: Secondary | ICD-10-CM | POA: Diagnosis present

## 2016-08-28 DIAGNOSIS — N179 Acute kidney failure, unspecified: Secondary | ICD-10-CM | POA: Diagnosis present

## 2016-08-28 DIAGNOSIS — F1010 Alcohol abuse, uncomplicated: Secondary | ICD-10-CM

## 2016-08-28 DIAGNOSIS — N184 Chronic kidney disease, stage 4 (severe): Secondary | ICD-10-CM | POA: Diagnosis present

## 2016-08-28 DIAGNOSIS — R180 Malignant ascites: Secondary | ICD-10-CM | POA: Diagnosis not present

## 2016-08-28 DIAGNOSIS — C3432 Malignant neoplasm of lower lobe, left bronchus or lung: Secondary | ICD-10-CM | POA: Diagnosis not present

## 2016-08-28 DIAGNOSIS — N183 Chronic kidney disease, stage 3 (moderate): Secondary | ICD-10-CM | POA: Diagnosis not present

## 2016-08-28 DIAGNOSIS — R188 Other ascites: Secondary | ICD-10-CM | POA: Diagnosis present

## 2016-08-28 DIAGNOSIS — R1084 Generalized abdominal pain: Secondary | ICD-10-CM | POA: Diagnosis not present

## 2016-08-28 DIAGNOSIS — Z9221 Personal history of antineoplastic chemotherapy: Secondary | ICD-10-CM | POA: Diagnosis not present

## 2016-08-28 DIAGNOSIS — Z809 Family history of malignant neoplasm, unspecified: Secondary | ICD-10-CM | POA: Diagnosis not present

## 2016-08-28 DIAGNOSIS — Z515 Encounter for palliative care: Secondary | ICD-10-CM | POA: Diagnosis not present

## 2016-08-28 DIAGNOSIS — Z8249 Family history of ischemic heart disease and other diseases of the circulatory system: Secondary | ICD-10-CM | POA: Diagnosis not present

## 2016-08-28 DIAGNOSIS — Z9842 Cataract extraction status, left eye: Secondary | ICD-10-CM | POA: Diagnosis not present

## 2016-08-28 DIAGNOSIS — Z79899 Other long term (current) drug therapy: Secondary | ICD-10-CM | POA: Diagnosis not present

## 2016-08-28 DIAGNOSIS — Z66 Do not resuscitate: Secondary | ICD-10-CM | POA: Diagnosis not present

## 2016-08-28 DIAGNOSIS — E1139 Type 2 diabetes mellitus with other diabetic ophthalmic complication: Secondary | ICD-10-CM

## 2016-08-28 DIAGNOSIS — Z794 Long term (current) use of insulin: Secondary | ICD-10-CM | POA: Diagnosis not present

## 2016-08-28 DIAGNOSIS — R109 Unspecified abdominal pain: Secondary | ICD-10-CM | POA: Diagnosis present

## 2016-08-28 LAB — GLUCOSE, CAPILLARY
GLUCOSE-CAPILLARY: 114 mg/dL — AB (ref 65–99)
GLUCOSE-CAPILLARY: 105 mg/dL — AB (ref 65–99)
Glucose-Capillary: 138 mg/dL — ABNORMAL HIGH (ref 65–99)
Glucose-Capillary: 124 mg/dL — ABNORMAL HIGH (ref 65–99)
Glucose-Capillary: 113 mg/dL — ABNORMAL HIGH (ref 65–99)

## 2016-08-28 LAB — LIPID PANEL
Cholesterol: 93 mg/dL (ref 0–200)
HDL: 25 mg/dL — ABNORMAL LOW (ref >40)
LDL CALC: 47 mg/dL (ref 0–99)
TRIGLYCERIDES: 104 mg/dL (ref <150)
Total CHOL/HDL Ratio: 3.7 RATIO
VLDL: 21 mg/dL (ref 0–40)

## 2016-08-28 LAB — BASIC METABOLIC PANEL
Anion gap: 12 (ref 5–15)
BUN: 37 mg/dL — AB (ref 6–20)
CHLORIDE: 101 mmol/L (ref 101–111)
CO2: 22 mmol/L (ref 22–32)
Calcium: 9.2 mg/dL (ref 8.9–10.3)
Creatinine, Ser: 2.05 mg/dL — ABNORMAL HIGH (ref 0.61–1.24)
GFR calc Af Amer: 40 mL/min — ABNORMAL LOW (ref >60)
GFR calc non Af Amer: 35 mL/min — ABNORMAL LOW (ref >60)
GLUCOSE: 123 mg/dL — AB (ref 65–99)
POTASSIUM: 5.1 mmol/L (ref 3.5–5.1)
Sodium: 135 mmol/L (ref 135–145)

## 2016-08-28 LAB — BRAIN NATRIURETIC PEPTIDE: B NATRIURETIC PEPTIDE 5: 172.8 pg/mL — AB (ref 0.0–100.0)

## 2016-08-28 LAB — TROPONIN I
TROPONIN I: 0.04 ng/mL — AB (ref <0.03)
TROPONIN I: 0.08 ng/mL — AB (ref <0.03)
Troponin I: 0.05 ng/mL (ref <0.03)

## 2016-08-28 LAB — LACTIC ACID, PLASMA
LACTIC ACID, VENOUS: 4.2 mmol/L — AB (ref 0.5–1.9)
Lactic Acid, Venous: 3.9 mmol/L (ref 0.5–1.9)

## 2016-08-28 LAB — PROCALCITONIN: Procalcitonin: 1.99 ng/mL

## 2016-08-28 LAB — D-DIMER, QUANTITATIVE: D-Dimer, Quant: 5.88 ug/mL-FEU — ABNORMAL HIGH (ref 0.00–0.50)

## 2016-08-28 LAB — ECHOCARDIOGRAM COMPLETE
Height: 71 in
WEIGHTICAEL: 2479.73 [oz_av]

## 2016-08-28 MED ORDER — VITAMIN D 1000 UNITS PO TABS
1000.0000 [IU] | ORAL_TABLET | Freq: Every day | ORAL | Status: DC
  Administered 2016-08-28 – 2016-08-30 (×3): 1000 [IU] via ORAL
  Filled 2016-08-28 (×3): qty 1

## 2016-08-28 MED ORDER — INSULIN ASPART 100 UNIT/ML ~~LOC~~ SOLN
0.0000 [IU] | Freq: Three times a day (TID) | SUBCUTANEOUS | Status: DC
  Administered 2016-08-28 – 2016-08-30 (×2): 1 [IU] via SUBCUTANEOUS

## 2016-08-28 MED ORDER — SODIUM CHLORIDE 0.9% FLUSH
10.0000 mL | INTRAVENOUS | Status: DC | PRN
  Administered 2016-08-28 – 2016-08-31 (×2): 10 mL
  Filled 2016-08-28 (×2): qty 40

## 2016-08-28 MED ORDER — LIDOCAINE-PRILOCAINE 2.5-2.5 % EX CREA: 1.0000 "application " | TOPICAL_CREAM | CUTANEOUS | Status: DC | PRN

## 2016-08-28 MED ORDER — ALPRAZOLAM 0.25 MG PO TABS
0.2500 mg | ORAL_TABLET | Freq: Two times a day (BID) | ORAL | Status: DC | PRN
  Administered 2016-08-30: 0.25 mg via ORAL
  Filled 2016-08-28 (×2): qty 1

## 2016-08-28 MED ORDER — PANTOPRAZOLE SODIUM 40 MG PO TBEC
40.0000 mg | DELAYED_RELEASE_TABLET | Freq: Every day | ORAL | Status: DC
  Administered 2016-08-29 – 2016-08-30 (×2): 40 mg via ORAL
  Filled 2016-08-28 (×2): qty 1

## 2016-08-28 MED ORDER — NICOTINE 14 MG/24HR TD PT24
14.0000 mg | MEDICATED_PATCH | Freq: Every day | TRANSDERMAL | Status: DC
  Administered 2016-08-28 – 2016-08-31 (×4): 14 mg via TRANSDERMAL
  Filled 2016-08-28 (×4): qty 1

## 2016-08-28 MED ORDER — ONDANSETRON HCL 4 MG/2ML IJ SOLN
4.0000 mg | Freq: Three times a day (TID) | INTRAMUSCULAR | Status: DC | PRN
  Administered 2016-08-28 – 2016-08-29 (×2): 4 mg via INTRAVENOUS
  Filled 2016-08-28 (×2): qty 2

## 2016-08-28 MED ORDER — VANCOMYCIN HCL IN DEXTROSE 1-5 GM/200ML-% IV SOLN
1000.0000 mg | INTRAVENOUS | Status: DC
  Administered 2016-08-28 – 2016-08-29 (×2): 1000 mg via INTRAVENOUS
  Filled 2016-08-28 (×2): qty 200

## 2016-08-28 MED ORDER — ASPIRIN EC 81 MG PO TBEC
81.0000 mg | DELAYED_RELEASE_TABLET | Freq: Every morning | ORAL | Status: DC
  Administered 2016-08-28 – 2016-08-30 (×3): 81 mg via ORAL
  Filled 2016-08-28 (×3): qty 1

## 2016-08-28 MED ORDER — OXYCODONE HCL 5 MG PO TABS
5.0000 mg | ORAL_TABLET | Freq: Four times a day (QID) | ORAL | Status: DC | PRN
  Administered 2016-08-28 – 2016-08-31 (×7): 5 mg via ORAL
  Filled 2016-08-28 (×8): qty 1

## 2016-08-28 MED ORDER — DOCUSATE SODIUM 100 MG PO CAPS
100.0000 mg | ORAL_CAPSULE | Freq: Two times a day (BID) | ORAL | Status: DC
  Administered 2016-08-28 – 2016-08-30 (×7): 100 mg via ORAL
  Filled 2016-08-28 (×7): qty 1

## 2016-08-28 MED ORDER — ACETAMINOPHEN 500 MG PO TABS: 500.0000 mg | ORAL_TABLET | Freq: Four times a day (QID) | ORAL | Status: DC | PRN

## 2016-08-28 MED ORDER — SIMVASTATIN 40 MG PO TABS: 40.0000 mg | ORAL_TABLET | Freq: Every day | ORAL | Status: DC

## 2016-08-28 MED ORDER — MORPHINE SULFATE ER 30 MG PO TBCR
30.0000 mg | EXTENDED_RELEASE_TABLET | Freq: Two times a day (BID) | ORAL | Status: DC
  Administered 2016-08-28 – 2016-08-31 (×8): 30 mg via ORAL
  Filled 2016-08-28 (×8): qty 1

## 2016-08-28 MED ORDER — LACTULOSE 10 GM/15ML PO SOLN
10.0000 g | Freq: Two times a day (BID) | ORAL | Status: DC
  Administered 2016-08-28 – 2016-08-30 (×7): 10 g via ORAL
  Filled 2016-08-28 (×7): qty 30

## 2016-08-28 MED ORDER — SODIUM CHLORIDE 0.9 % IV BOLUS (SEPSIS)
1500.0000 mL | Freq: Once | INTRAVENOUS | Status: AC
  Administered 2016-08-28: 1500 mL via INTRAVENOUS

## 2016-08-28 MED ORDER — VITAMIN C 500 MG PO TABS
500.0000 mg | ORAL_TABLET | Freq: Two times a day (BID) | ORAL | Status: DC
  Administered 2016-08-28 – 2016-08-30 (×6): 500 mg via ORAL
  Filled 2016-08-28 (×6): qty 1

## 2016-08-28 MED ORDER — ATORVASTATIN CALCIUM 20 MG PO TABS
20.0000 mg | ORAL_TABLET | Freq: Every day | ORAL | Status: DC
  Administered 2016-08-28 – 2016-08-30 (×3): 20 mg via ORAL
  Filled 2016-08-28 (×3): qty 1

## 2016-08-28 MED ORDER — ALBUTEROL SULFATE (2.5 MG/3ML) 0.083% IN NEBU: 2.5000 mg | INHALATION_SOLUTION | RESPIRATORY_TRACT | Status: DC | PRN

## 2016-08-28 MED ORDER — HEPARIN SODIUM (PORCINE) 5000 UNIT/ML IJ SOLN
5000.0000 [IU] | Freq: Three times a day (TID) | INTRAMUSCULAR | Status: DC
  Administered 2016-08-28 – 2016-08-30 (×8): 5000 [IU] via SUBCUTANEOUS
  Filled 2016-08-28 (×8): qty 1

## 2016-08-28 MED ORDER — FE FUMARATE-B12-VIT C-FA-IFC PO CAPS
1.0000 | ORAL_CAPSULE | Freq: Every morning | ORAL | Status: DC
  Administered 2016-08-28 – 2016-08-30 (×3): 1 via ORAL
  Filled 2016-08-28 (×3): qty 1

## 2016-08-28 MED ORDER — AMLODIPINE BESYLATE 5 MG PO TABS
10.0000 mg | ORAL_TABLET | Freq: Every day | ORAL | Status: DC
  Administered 2016-08-28 – 2016-08-30 (×3): 10 mg via ORAL
  Filled 2016-08-28 (×3): qty 2

## 2016-08-28 MED ORDER — PIPERACILLIN-TAZOBACTAM 3.375 G IVPB
3.3750 g | Freq: Three times a day (TID) | INTRAVENOUS | Status: DC
  Administered 2016-08-28 – 2016-08-29 (×3): 3.375 g via INTRAVENOUS
  Filled 2016-08-28 (×3): qty 50

## 2016-08-28 MED ORDER — TAMSULOSIN HCL 0.4 MG PO CAPS
0.4000 mg | ORAL_CAPSULE | Freq: Every day | ORAL | Status: DC
  Administered 2016-08-28 – 2016-08-30 (×3): 0.4 mg via ORAL
  Filled 2016-08-28 (×3): qty 1

## 2016-08-28 MED ORDER — HYDRALAZINE HCL 20 MG/ML IJ SOLN: 5.0000 mg | INTRAMUSCULAR | Status: DC | PRN

## 2016-08-28 MED ORDER — SODIUM CHLORIDE 0.9 % IV SOLN
INTRAVENOUS | Status: DC
  Administered 2016-08-28 – 2016-08-29 (×4): via INTRAVENOUS

## 2016-08-28 MED ORDER — FOLIC ACID 1 MG PO TABS
1.0000 mg | ORAL_TABLET | Freq: Every day | ORAL | Status: DC
  Administered 2016-08-28 – 2016-08-30 (×3): 1 mg via ORAL
  Filled 2016-08-28 (×3): qty 1

## 2016-08-28 MED ORDER — VITAMIN B-1 100 MG PO TABS
100.0000 mg | ORAL_TABLET | Freq: Every day | ORAL | Status: DC
  Administered 2016-08-28 – 2016-08-30 (×3): 100 mg via ORAL
  Filled 2016-08-28 (×3): qty 1

## 2016-08-28 MED ORDER — ZOLPIDEM TARTRATE 5 MG PO TABS
5.0000 mg | ORAL_TABLET | Freq: Every evening | ORAL | Status: DC | PRN
  Administered 2016-08-29: 5 mg via ORAL
  Filled 2016-08-28: qty 1

## 2016-08-28 MED ORDER — FERROUS SULFATE 325 (65 FE) MG PO TABS
325.0000 mg | ORAL_TABLET | Freq: Every day | ORAL | Status: DC
  Administered 2016-08-30: 325 mg via ORAL
  Filled 2016-08-28: qty 1

## 2016-08-28 MED ORDER — INSULIN ASPART PROT & ASPART (70-30 MIX) 100 UNIT/ML ~~LOC~~ SUSP
7.0000 [IU] | Freq: Two times a day (BID) | SUBCUTANEOUS | Status: DC
  Administered 2016-08-30 (×2): 7 [IU] via SUBCUTANEOUS
  Filled 2016-08-28: qty 10

## 2016-08-28 MED ORDER — MORPHINE SULFATE (PF) 4 MG/ML IV SOLN
2.0000 mg | INTRAVENOUS | Status: DC | PRN
  Administered 2016-08-28 – 2016-08-29 (×7): 2 mg via INTRAVENOUS
  Filled 2016-08-28 (×7): qty 1

## 2016-08-28 MED ORDER — NITROGLYCERIN 0.4 MG SL SUBL: 0.4000 mg | SUBLINGUAL_TABLET | SUBLINGUAL | Status: DC | PRN

## 2016-08-28 MED ORDER — PIPERACILLIN-TAZOBACTAM 3.375 G IVPB 30 MIN
3.3750 g | INTRAVENOUS | Status: AC
  Administered 2016-08-28: 3.375 g via INTRAVENOUS
  Filled 2016-08-28 (×2): qty 50

## 2016-08-28 NOTE — Progress Notes (Signed)
The order for simvastatin(Zocor) was changed to an equivalent dose of atorvastatin(Lipitor) due to the potential drug interaction with amlodipine .  When taken in combination with medications that inhibit its metabolism, simvastatin can accumulate which increases the risk of liver toxicity, myopathy, or rhabdomyolysis.  Simvastatin dose should not exceed 10mg /day in patients taking verapamil, diltiazem, fibrates, or niacin >or= 1g/day.   Simvastatin dose should not exceed 20mg /day in patients taking amlodipine, ranolazine or amiodarone.   Therefore, the order has been automatically adjusted to atorvastatin 20 mg PO daily per P and T committee protocol.   Please consider this potential interaction at discharge.  Royetta Asal, PharmD, BCPS Pager [phone] 08/28/2016 1:08 PM

## 2016-08-28 NOTE — Progress Notes (Signed)
Pharmacy Antibiotic Note  Keith Holloway is a 56 y.o. male c/o back and abdominal pain admitted on 08/27/2016 with sepsis.  Pharmacy has been consulted for zosyn/vancomycin dosing.  Plan: Zosyn 3.375g IV q8h (4 hour infusion).  Vancomycin 1 Gm IV q24h VT=15-20 Daily Scr F/u cultures/levels  Height: 5\' 11"  (180.3 cm) Weight: 155 lb 6.4 oz (70.5 kg) IBW/kg (Calculated) : 75.3  Temp (24hrs), Avg:98.8 F (37.1 C), Min:98.3 F (36.8 C), Max:99.1 F (37.3 C)   Recent Labs Lab 08/27/16 1929 08/28/16 0107  WBC 49.8*  --   CREATININE 2.35*  --   LATICACIDVEN  --  4.2*    Estimated Creatinine Clearance: 35 mL/min (A) (by C-G formula based on SCr of 2.35 mg/dL (H)).    No Known Allergies  Antimicrobials this admission: 7/14 zosyn >>  7/14 vancomycin >>   Dose adjustments this admission:   Microbiology results:  BCx:   UCx:    Sputum:    MRSA PCR:   Thank you for allowing pharmacy to be a part of this patient's care.  Dorrene German 08/28/2016 3:45 AM

## 2016-08-28 NOTE — Progress Notes (Signed)
  Echocardiogram 2D Echocardiogram has been performed.  Keith Holloway 08/28/2016, 10:39 AM

## 2016-08-28 NOTE — Progress Notes (Signed)
CRITICAL VALUE ALERT  Critical Value:  Troponin 0.05  Date & Time Notied:  08/28/2016  1333  Provider Notified: [place]  Orders Received/Actions taken: MD aware

## 2016-08-28 NOTE — Progress Notes (Addendum)
CRITICAL VALUE ALERT  Critical Value:  Lactic Acid 4.2  Date & Time Notied:  08/28/16 0329  Provider Notified:MD Opyd   Orders Received/Actions taken: Told to page MD Blaine Hamper (paged at [phone])

## 2016-08-28 NOTE — Progress Notes (Signed)
[facility] PROGRESS NOTE  Keith Holloway ZSW:[id number] DOB: 1960-02-27 DOA: 08/27/2016 PCP: [name], MD  Interim summary and HPI 56 y.o. 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  Assessment/Plan: 1-chest pain: atypical, but with heart score of 4 -mild elevation of troponin; which could be secondary to renal failure -still with some atypical intermittent chest discomfort  -no abnormalities of ischemia on EKG or telemetry -2-D echo reassuring  -unable to stay still to check V/Q scan; will continue treatment with IVF's and once renal function improved will check CTA -pain could also be explain by further progression of his lung cancer  2-Sepsis -no final source found -will continue IV abx;s for now -follow cx's -continue supportive care and IVF's -lactic acid trending down  3-abd pain -CT demonstrating mild ascites and progression metastasis to his liver -will continue pain meds  4-type 2 diabetes -last A1C 7.1 -will continue insulin -close follow up of his CBG's; patient not eating much and at risk for hypoglycemia  5-HTN -will continue norvasc  6-alcohol and tobacco abuse -cessation counseling provided -will continue nicoderm -continue thiamine and folic acid  7-Hyperkalemia -resolved with use of kayexalate and insulin  -will monitor -most likely associated with worsening renal failure   8-non-small cell lung cancer: stage 4 -oncology service notified -will follow rec's  9-acute on chronic renal failure: stage 3 at baseline -Cr baseline 1-1.6 -on admission 2.35 -appears to be secondary to dehydration and pre-renal azotemia -will minimize nephrotoxic agents -continue  IVF's -follow trend   10-N/V -will continue PRN antiemetics  -continue PPI  Code Status: Full Family Communication: wife at bedside  Disposition Plan: to be determine; will continue IV antibiotics for now; follow renal function and if possible/renal function allows CTA in am.   Consultants:  Oncology   Procedures:  See below for x-ray reports   2-D echo - Left ventricle: The cavity size was normal. There was mild   concentric hypertrophy. Systolic function was vigorous. The   estimated ejection fraction was in the range of 65% to 70%. Wall   motion was normal; there were no regional wall motion   abnormalities. The study is not technically sufficient to allow   evaluation of LV diastolic function. - Right atrium: There was the appearance of a Chiari network. With   bacteremia, consider TEE to exclude vegetation (image 56).  Antibiotics:  vanc and zosyn 7/13  HPI/Subjective: Afebrile, tachypneic and mild chest discomfort still present. No nausea, no vomiting.   Objective: Vitals:   08/28/16 0500 08/28/16 1351  BP: (!) 158/81 (!) 141/87  Pulse: (!) 118 (!) 114  Resp: 18 18  Temp: 98.4 F (36.9 C) 98.4 F (36.9 C)    Intake/Output Summary (Last 24 hours) at 08/28/16 1927 Last data filed at 08/28/16 1852  Gross per 24 hour  Intake          2138.33 ml  Output              250 ml  Net          1888.33 ml   Filed Weights   08/28/16 0242 08/28/16 0500  Weight: 70.5 kg (155 lb 6.4 oz) 70.3 kg (154 lb 15.7 oz)    Exam:   General:  Afebrile, reports some right side chest discomfort, no nausea,  no vomiting. Positive tachypnea and using [place] supplementation.   Cardiovascular: mild tachycardia, no rubs, no gallops, no JVD  Respiratory: positive tachypnea, no wheezing, no crackles   Abdomen: mild distended, soft, RUQ pain, positive BS  Musculoskeletal: trace edema, no cyanosis, no clubbing   Data Reviewed: Basic Metabolic Panel:  Recent Labs Lab  08/27/16 1929 08/28/16 0849  NA 134* 135  K 6.0* 5.1  CL 98* 101  CO2 23 22  GLUCOSE 110* 123*  BUN 39* 37*  CREATININE 2.35* 2.05*  CALCIUM 10.2 9.2   CBC:  Recent Labs Lab 08/27/16 1929  WBC 49.8*  HGB 10.2*  HCT 29.8*  MCV 65.9*  PLT 356   Cardiac Enzymes:  Recent Labs Lab 08/28/16 0610 08/28/16 1257  TROPONINI 0.08*  0.04* 0.05*   BNP (last 3 results)  Recent Labs  08/28/16 0610  BNP 172.8*   CBG:  Recent Labs Lab 08/28/16 0248 08/28/16 0753 08/28/16 1133 08/28/16 1705  GLUCAP 138* 114* 124* 113*    Studies: Ct Abdomen Pelvis Wo Contrast  Result Date: 08/27/2016 CLINICAL DATA:  56 year old male with abdominal pain. History of left lung cancer. EXAM: CT ABDOMEN AND PELVIS WITHOUT CONTRAST TECHNIQUE: Multidetector CT imaging of the abdomen and pelvis was performed following the standard protocol without IV contrast. COMPARISON:  Abdominal CT dated 05/06/2016 FINDINGS: Evaluation of this exam is limited in the absence of intravenous contrast. Lower chest: There is a small left pleural effusion. There is a 2.1 x 1.7 cm lesion in the superior segment of the left lower lobe abutting the fissure which is increased in size compared the prior CT (previously measuring 1.1 x 1.0 cm). A 1.2 x 0.9 cm nodule at the left lung base appears new or increased compared to prior CT. Multiple small nodular density along the minor fissure measure up to 5 mm and appear new compared to the prior CT. There is a 3 mm nodule at the right lung base (series 3, image 6). Partially visualized bilateral hilar adenopathy. Subcarinal and mediastinal adenopathy. Partially visualized bilobed masses along the left pericardium measure 5.5 x 2.2 cm. There are bibasilar streaky densities, likely atelectasis, although infiltrate is not excluded. No intra-abdominal free air. Small ascites, new compared to the prior CT. Hepatobiliary: The liver is enlarged. There are multiple ill-defined hypoechoic  liver masses consistent with metastatic disease. There has been significant interval increase in the size of the left lobe of the liver compared to the prior CT. No intrahepatic biliary dilatation. The gallbladder is contracted. Pancreas: The pancreas is unremarkable. Spleen: Normal in size without focal abnormality. Adrenals/Urinary Tract: The kidneys, visualized ureters, and urinary bladder appear unremarkable. Stomach/Bowel: No evidence of bowel obstruction. The appendix is not identified with certainty. Vascular/Lymphatic: There is mild aortoiliac atherosclerotic disease. Evaluation of the abdominal aorta and IVC is limited in the absence of intravenous contrast. No portal venous gas identified. No adenopathy. Reproductive: The prostate and seminal vesicles are grossly unremarkable. Other: Mild diffuse subcutaneous edema and anasarca. No fluid collection. Musculoskeletal: No acute or significant osseous findings. Partially visualized sclerotic changes of the sternum as seen on the prior CT may be related to radiation changes. IMPRESSION: 1. Interval progression of metastatic disease with enlargement of the pulmonary nodules. 2. Partially visualized small left pleural effusion with no significant interval change compared to prior study. 3. Hilar and mediastinal adenopathy is, partially visualized, grossly unchanged. 4. Significant interval enlargement of the liver compared to the prior CT with overall progression of metastatic disease. Evaluation  of the lesions however are limited in the absence of intravenous contrast. 5. Small ascites, new compared to the prior CT. 6. No bowel obstruction. Electronically Signed   By: Anner Crete M.D.   On: 08/27/2016 22:37   Dg Chest 2 View  Result Date: 08/27/2016 CLINICAL DATA:  Cough, mid chest pain worse today, new abdominal distension, hematuria, history lung cancer (NSCLC stage IV), type II diabetes mellitus, CHF, chronic kidney disease, essential benign  hypertension EXAM: CHEST  2 VIEW COMPARISON:  CT chest 05/07/2013 FINDINGS: RIGHT jugular Port-A-Cath with tip projecting over cavoatrial junction. Normal heart size and pulmonary vascularity. Abnormal density projecting at AP window/hilum on AP view, by prior CT in anterior LEFT upper lobe/mediastinum. Bibasilar atelectasis. Rounded opacity at LEFT lung base question metastasis 20 x 20 mm in size. No definite infiltrate, pleural effusion, or pneumothorax. No acute osseous findings. IMPRESSION: Bibasilar atelectasis with 20 x 20 mm nodular density at LEFT lung base question pulmonary metastasis. Known LEFT upper lobe/mediastinal tumor. Electronically Signed   By: Lavonia Dana M.D.   On: 08/27/2016 18:25    Scheduled Meds: . amLODipine  10 mg Oral Daily  . aspirin EC  81 mg Oral q morning - 10a  . atorvastatin  20 mg Oral q1800  . cholecalciferol  1,000 Units Oral Daily  . docusate sodium  100 mg Oral BID  . ferrous [id number] C-folic acid  1 capsule Oral q morning - 10a  . ferrous sulfate  325 mg Oral Q breakfast  . heparin  5,000 Units Subcutaneous Q8H  . insulin aspart  0-9 Units Subcutaneous TID WC  . insulin aspart protamine- aspart  7 Units Subcutaneous BID WC  . lactulose  10 g Oral BID  . morphine  30 mg Oral Q12H  . nicotine  14 mg Transdermal Daily  . tamsulosin  0.4 mg Oral Daily  . vitamin C  500 mg Oral BID   Continuous Infusions: . sodium chloride 125 mL/hr at 08/28/16 1847  . piperacillin-tazobactam (ZOSYN)  IV Stopped (08/28/16 1543)  . vancomycin Stopped (08/28/16 0700)    Principal Problem:   Chest pain Active Problems:   Diabetes mellitus with ophthalmic complication (HCC)   Alcohol abuse   Essential hypertension, benign   Hyperkalemia   Sepsis ([place])   Non-small cell carcinoma of left lung, stage 4 (HCC)   Cocaine abuse   Tobacco abuse disorder   Acute renal failure superimposed on stage 3 chronic kidney disease ([place])   Abdominal pain    Leukocytosis    Time spent: 30 minutes    Barton Dubois  [facility] Pager [phone]. If 7PM-7AM, please contact night-coverage at [email or web address], password [facility] 08/28/2016, 7:27 PM  LOS: 0 days

## 2016-08-29 LAB — RAPID URINE DRUG SCREEN, HOSP PERFORMED
Amphetamines: NOT DETECTED
BARBITURATES: NOT DETECTED
BENZODIAZEPINES: NOT DETECTED
COCAINE: NOT DETECTED
Opiates: POSITIVE — AB
TETRAHYDROCANNABINOL: NOT DETECTED

## 2016-08-29 LAB — BASIC METABOLIC PANEL
ANION GAP: 11 (ref 5–15)
BUN: 35 mg/dL — ABNORMAL HIGH (ref 6–20)
CO2: 23 mmol/L (ref 22–32)
Calcium: 8.9 mg/dL (ref 8.9–10.3)
Chloride: 104 mmol/L (ref 101–111)
Creatinine, Ser: 2.08 mg/dL — ABNORMAL HIGH (ref 0.61–1.24)
GFR, EST AFRICAN AMERICAN: 39 mL/min — AB (ref >60)
GFR, EST NON AFRICAN AMERICAN: 34 mL/min — AB (ref >60)
GLUCOSE: 95 mg/dL (ref 65–99)
POTASSIUM: 4.6 mmol/L (ref 3.5–5.1)
Sodium: 138 mmol/L (ref 135–145)

## 2016-08-29 LAB — GLUCOSE, CAPILLARY
GLUCOSE-CAPILLARY: 92 mg/dL (ref 65–99)
GLUCOSE-CAPILLARY: 89 mg/dL (ref 65–99)
GLUCOSE-CAPILLARY: 83 mg/dL (ref 65–99)
Glucose-Capillary: 97 mg/dL (ref 65–99)

## 2016-08-29 LAB — SODIUM, URINE, RANDOM: Sodium, Ur: 20 mmol/L

## 2016-08-29 LAB — CREATININE, URINE, RANDOM: Creatinine, Urine: 197.21 mg/dL

## 2016-08-29 MED ORDER — MORPHINE SULFATE (PF) 4 MG/ML IV SOLN
2.0000 mg | INTRAVENOUS | Status: AC | PRN
  Administered 2016-08-29 – 2016-08-30 (×3): 2 mg via INTRAVENOUS
  Filled 2016-08-29 (×3): qty 1

## 2016-08-29 NOTE — Plan of Care (Signed)
Problem: Nutrition: Goal: Adequate nutrition will be maintained Outcome: Not Progressing Patient is NPO.   

## 2016-08-29 NOTE — Progress Notes (Signed)
[facility] PROGRESS NOTE  Keith Holloway YQM:[id number] DOB: [birth year] DOA: 08/27/2016 PCP: [name], MD  Interim summary and HPI 56 y.o. 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  Assessment/Plan: 1-chest pain: atypical, but with heart score of 4 -mild elevation of troponin; which could be secondary to renal failure -still with some atypical intermittent chest discomfort; mainly affecting right side. -no abnormalities of ischemia on EKG or telemetry -2-D echo reassuring; and currently given renal function he will not be a candidate for cath anyway. Will advance diet  -unable to stay still to check V/Q scan; will continue treatment with IVF's and once renal function improved will check CTA -pain could also be explain by further progression of his lung cancer -palliative care consulted -will discussed treatment option with oncology in am.  2-Sepsis -no final source found -will continue IV abx,s for now -follow cx's, especially blood cx, because if positive will need to consider TEE. -continue supportive care and IVF's -lactic acid trending down; will repeat level in am -patient remains afebrile.  3-abd pain -CT demonstrating mild ascites and progression metastasis to his liver -given ongoing pain and lack of relief with current management; will consult palliative team for assistance with pain tx and to review goals of care.  4-type 2 diabetes -last A1C 7.1 -will continue SSI -close follow up of his CBG's; patient not eating much and at risk for hypoglycemia  5-HTN -will continue norvasc -BP is stable now  6-alcohol and tobacco abuse -cessation counseling provided -will continue  nicoderm -continue thiamine and folic acid  7-Hyperkalemia -resolved with use of kayexalate and insulin  -will monitor electrolyte trend  -most likely associated with worsening renal failure; K is now 4.6  8-non-small cell lung cancer: stage 4 -oncology service notified -will follow rec's -images done on admission suggesting progression of disease and especially expansion on metastatic lesions to the liver.  9-acute on chronic renal failure: stage 3 at baseline -Cr baseline 1-1.6 -on admission 2.35 -appears to be secondary to dehydration and pre-renal azotemia -will continue to minimize nephrotoxic agents -continue IVF's -follow trend; CR down to 2.08 now  10-N/V -improved overall  -will continue PRN antiemetics  -continue PPI -will advance diet    Code Status: Full Family Communication: wife at bedside  Disposition Plan: to be determine; will continue IV antibiotics for now; follow renal function and if possible/renal function allows CTA. Patient continue to have pain on right side.   Consultants:  Oncology   Procedures:  See below for x-ray reports   2-D echo - Left ventricle: The cavity size was normal. There was mild   concentric hypertrophy. Systolic function was vigorous. The   estimated ejection fraction was in the range of 65% to 70%. Wall   motion was normal; there were no regional wall motion   abnormalities. The study is not technically sufficient to allow   evaluation of LV diastolic function. - Right atrium: There was the appearance of a Chiari network. With   bacteremia, consider TEE to exclude vegetation (image 56).  Antibiotics:  vanc and zosyn 7/13  HPI/Subjective: Afebrile, with some improvements in tachycardia and tachypnea. Still with ongoing pain on his right side and right abd quadrant. No nausea, no vomiting.  Objective: Vitals:   08/29/16 1322 08/29/16 2057  BP: Marland Kitchen)  151/95 127/78  Pulse: (!) 113 (!) 115  Resp: 18 20  Temp: 97.8  F (36.6 C) 98.3 F (36.8 C)    Intake/Output Summary (Last 24 hours) at 08/29/16 2228 Last data filed at 08/29/16 2200  Gross per 24 hour  Intake           3362.5 ml  Output              350 ml  Net           3012.5 ml   Filed Weights   08/28/16 0242 08/28/16 0500  Weight: 70.5 kg (155 lb 6.4 oz) 70.3 kg (154 lb 15.7 oz)    Exam:   General:  Afebrile, no nausea, no vomiting. Reporting right side and right upper quadrant excruciating pain. Patient also noticed some decrease in urine output. Continue to use oxygen supplementation.  Cardiovascular: improved tachycardia, no rubs or gallops; no JVD.  Respiratory: no tachypnea, no wheezing, no crackles  Abdomen: slightly worse abd distension, no guarding and positive BS.patient continue having pain on his Right quadrant.  Musculoskeletal:trace edema bilaterally, no cyanosis  Data Reviewed: Basic Metabolic Panel:  Recent Labs Lab 08/27/16 1929 08/28/16 0849 08/29/16 0502  NA 134* 135 138  K 6.0* 5.1 4.6  CL 98* 101 104  CO2 23 22 23   GLUCOSE 110* 123* 95  BUN 39* 37* 35*  CREATININE 2.35* 2.05* 2.08*  CALCIUM 10.2 9.2 8.9   CBC:  Recent Labs Lab 08/27/16 1929  WBC 49.8*  HGB 10.2*  HCT 29.8*  MCV 65.9*  PLT 356   Cardiac Enzymes:  Recent Labs Lab 08/28/16 0610 08/28/16 1257  TROPONINI 0.08*  0.04* 0.05*   BNP (last 3 results)  Recent Labs  08/28/16 0610  BNP 172.8*   CBG:  Recent Labs Lab 08/28/16 2044 08/29/16 0739 08/29/16 1149 08/29/16 1633 08/29/16 2054  GLUCAP 105* 97 92 89 83    Studies: No results found.  Scheduled Meds: . amLODipine  10 mg Oral Daily  . aspirin EC  81 mg Oral q morning - 10a  . atorvastatin  20 mg Oral q1800  . cholecalciferol  1,000 Units Oral Daily  . docusate sodium  100 mg Oral BID  . ferrous [id number] C-folic acid  1 capsule Oral q morning - 10a  . ferrous sulfate  325 mg Oral Q breakfast  . folic acid  1 mg Oral Daily  . heparin   5,000 Units Subcutaneous Q8H  . insulin aspart  0-9 Units Subcutaneous TID WC  . insulin aspart protamine- aspart  7 Units Subcutaneous BID WC  . lactulose  10 g Oral BID  . morphine  30 mg Oral Q12H  . nicotine  14 mg Transdermal Daily  . pantoprazole  40 mg Oral Q1200  . tamsulosin  0.4 mg Oral Daily  . thiamine  100 mg Oral Daily  . vitamin C  500 mg Oral BID   Continuous Infusions: . sodium chloride 100 mL/hr at 08/29/16 1055    Principal Problem:   Chest pain Active Problems:   Diabetes mellitus with ophthalmic complication (HCC)   Alcohol abuse   Essential hypertension, benign   Hyperkalemia   Sepsis ([place])   Non-small cell carcinoma of left lung, stage 4 (HCC)   Cocaine abuse   Tobacco abuse disorder   Acute renal failure superimposed on stage 3 chronic kidney disease (HCC)   Abdominal pain   Leukocytosis    Time spent: 25 minutes  Barton Dubois  [facility] Pager [phone]. If 7PM-7AM, please contact night-coverage at [email or web address], password [facility] 08/29/2016, 10:28 PM  LOS: 1 day

## 2016-08-30 DIAGNOSIS — C3432 Malignant neoplasm of lower lobe, left bronchus or lung: Secondary | ICD-10-CM

## 2016-08-30 DIAGNOSIS — R188 Other ascites: Secondary | ICD-10-CM

## 2016-08-30 DIAGNOSIS — Z515 Encounter for palliative care: Secondary | ICD-10-CM

## 2016-08-30 DIAGNOSIS — R180 Malignant ascites: Secondary | ICD-10-CM

## 2016-08-30 DIAGNOSIS — R0602 Shortness of breath: Secondary | ICD-10-CM

## 2016-08-30 DIAGNOSIS — Z66 Do not resuscitate: Secondary | ICD-10-CM

## 2016-08-30 DIAGNOSIS — C787 Secondary malignant neoplasm of liver and intrahepatic bile duct: Secondary | ICD-10-CM

## 2016-08-30 LAB — GLUCOSE, CAPILLARY
GLUCOSE-CAPILLARY: 115 mg/dL — AB (ref 65–99)
Glucose-Capillary: 94 mg/dL (ref 65–99)
Glucose-Capillary: 133 mg/dL — ABNORMAL HIGH (ref 65–99)
Glucose-Capillary: 110 mg/dL — ABNORMAL HIGH (ref 65–99)

## 2016-08-30 LAB — BASIC METABOLIC PANEL
Anion gap: 12 (ref 5–15)
BUN: 38 mg/dL — AB (ref 6–20)
CHLORIDE: 103 mmol/L (ref 101–111)
CO2: 21 mmol/L — AB (ref 22–32)
CREATININE: 2.49 mg/dL — AB (ref 0.61–1.24)
Calcium: 8.9 mg/dL (ref 8.9–10.3)
GFR calc Af Amer: 32 mL/min — ABNORMAL LOW (ref >60)
GFR calc non Af Amer: 27 mL/min — ABNORMAL LOW (ref >60)
Glucose, Bld: 86 mg/dL (ref 65–99)
POTASSIUM: 5 mmol/L (ref 3.5–5.1)
Sodium: 136 mmol/L (ref 135–145)

## 2016-08-30 LAB — LACTIC ACID, PLASMA
Lactic Acid, Venous: 2.6 mmol/L (ref 0.5–1.9)
Lactic Acid, Venous: 2.5 mmol/L (ref 0.5–1.9)

## 2016-08-30 MED ORDER — ONDANSETRON 4 MG PO TBDP: 4.0000 mg | ORAL_TABLET | Freq: Four times a day (QID) | ORAL | Status: DC | PRN

## 2016-08-30 MED ORDER — MORPHINE SULFATE (PF) 4 MG/ML IV SOLN
2.0000 mg | INTRAVENOUS | Status: DC | PRN
  Administered 2016-08-30 – 2016-08-31 (×8): 2 mg via INTRAVENOUS
  Filled 2016-08-30 (×8): qty 1

## 2016-08-30 MED ORDER — SODIUM CHLORIDE 0.9 % IV BOLUS (SEPSIS)
500.0000 mL | Freq: Once | INTRAVENOUS | Status: AC
  Administered 2016-08-30: 500 mL via INTRAVENOUS

## 2016-08-30 MED ORDER — HALOPERIDOL 0.5 MG PO TABS
0.5000 mg | ORAL_TABLET | ORAL | Status: DC | PRN
  Filled 2016-08-30: qty 1

## 2016-08-30 MED ORDER — GLYCOPYRROLATE 0.2 MG/ML IJ SOLN
0.2000 mg | INTRAMUSCULAR | Status: DC | PRN
  Filled 2016-08-30: qty 1

## 2016-08-30 MED ORDER — BIOTENE DRY MOUTH MT LIQD: 15.0000 mL | OROMUCOSAL | Status: DC | PRN

## 2016-08-30 MED ORDER — HALOPERIDOL LACTATE 5 MG/ML IJ SOLN: 0.5000 mg | INTRAMUSCULAR | Status: DC | PRN

## 2016-08-30 MED ORDER — MORPHINE SULFATE (PF) 4 MG/ML IV SOLN
2.0000 mg | INTRAVENOUS | Status: DC | PRN
  Administered 2016-08-30: 2 mg via INTRAVENOUS
  Filled 2016-08-30: qty 1

## 2016-08-30 MED ORDER — ONDANSETRON HCL 4 MG/2ML IJ SOLN: 4.0000 mg | Freq: Four times a day (QID) | INTRAMUSCULAR | Status: DC | PRN

## 2016-08-30 MED ORDER — HALOPERIDOL LACTATE 2 MG/ML PO CONC
0.5000 mg | ORAL | Status: DC | PRN
  Filled 2016-08-30: qty 0.3

## 2016-08-30 MED ORDER — POLYVINYL ALCOHOL 1.4 % OP SOLN: 1.0000 [drp] | Freq: Four times a day (QID) | OPHTHALMIC | Status: DC | PRN

## 2016-08-30 MED ORDER — GLYCOPYRROLATE 1 MG PO TABS
1.0000 mg | ORAL_TABLET | ORAL | Status: DC | PRN
  Filled 2016-08-30: qty 1

## 2016-08-30 NOTE — Progress Notes (Signed)
[facility] PROGRESS NOTE  Keith Holloway EVO:[id number] DOB: 1960/11/28 DOA: 08/27/2016 PCP: [name], MD  Interim summary and HPI 56 y.o. 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  Assessment/Plan: 1-chest pain: atypical, but with heart score of 4 -mild elevation of troponin; which could be secondary to renal failure -still with some atypical intermittent chest discomfort; mainly affecting right side. Most likely from further spread of his metastatic disease. -no abnormalities of ischemia on EKG or telemetry -2-D echo reassuring; and currently given renal function he will not be a candidate for cath anyway.  -unable to stay still to check V/Q scan and unable to have CTA due to abnormal renal failure. -after discussing with family and given patient's active decline, they have opted for pursuing only comfort care. -oncology service and palliative care in agreement -will initiate end of life care order set -good candidate for residential hospice  2-Sepsis -no final source found -will discontinue antibiotics -focusing on comfort care only -no further blood draws  -patient has remained afebrile.  3-abd pain -CT demonstrated mild ascites and progression metastasis into his liver -with fluid resuscitation; his ascites has continue progressing and is making him very uncomfortable  -will ask for palliative paracentesis -stop IVF's -continue adjusted pain meds  4-type 2 diabetes -last A1C 7.1 -will now focus on full comfort care -no further CBG's or insuline therapy  5-HTN -will monitor VS -no further antihypertensive regimen -focusing just in comfort care.  6-alcohol and tobacco abuse -cessation  counseling provided -will focus now on comfort care.  7-Hyperkalemia -resolved with use of kayexalate and insulin  -will now focus on comfort care -no further blood draws will be made  8-non-small cell lung cancer: stage 4 -oncology service notified -images done on admission suggesting progression of disease and especially expansion on metastatic lesions to the liver. -following rec's will pursuit hospice.  9-acute on chronic renal failure: stage 3 at baseline -Cr baseline 1-1.6 -on admission 2.35 -appears to be secondary to dehydration and pre-renal azotemia -will now focus on comfort care  10-N/V -continue PRN antiemetics   Code Status: Full Family Communication: wife at bedside  Disposition Plan: full comfort care; hospice care.   Consultants:  Oncology   Procedures:  See below for x-ray reports   2-D echo - Left ventricle: The cavity size was normal. There was mild   concentric hypertrophy. Systolic function was vigorous. The   estimated ejection fraction was in the range of 65% to 70%. Wall   motion was normal; there were no regional wall motion   abnormalities. The study is not technically sufficient to allow   evaluation of LV diastolic function. - Right atrium: There was the appearance of a [place] network. With   bacteremia, consider TEE to exclude vegetation (image 56).  Antibiotics:  vanc and zosyn 7/13  HPI/Subjective: Patient with worsening ascites; more uncomfortable and still with a lot of pain. Also with worsening renal failure.  Objective: Vitals:   08/30/16 0956 08/30/16 1358  BP: 135/84 132/90  Pulse:  (!) 112  Resp:  16  Temp:  98 F (36.7 C)    Intake/Output Summary (Last 24 hours) at 08/30/16 1909 Last data filed at 08/30/16 1822  Gross per 24 hour  Intake          1611.67  ml  Output              676 ml  Net           935.67 ml   Filed Weights   08/28/16 0242 08/28/16 0500  Weight: 70.5 kg (155 lb 6.4 oz) 70.3 kg (154 lb  15.7 oz)    Exam:   General:  Afebrile, no nausea, no vomiting. Continue having pain on his right side and also with increase abd distension/ascites.  Cardiovascular: no palpitations, S1 and S2, no gallops  Respiratory: positive tachypnea, no crackles, no wheezing   Abdomen: worse abd distension and ascites; patient complaining of worse right side pain  Musculoskeletal: trace edema, no cyanosis  Data Reviewed: Basic Metabolic Panel:  Recent Labs Lab 08/27/16 1929 08/28/16 0849 08/29/16 0502 08/30/16 0307  NA 134* 135 138 136  K 6.0* 5.1 4.6 5.0  CL 98* 101 104 103  CO2 23 22 23  21*  GLUCOSE 110* 123* 95 86  BUN 39* 37* 35* 38*  CREATININE 2.35* 2.05* 2.08* 2.49*  CALCIUM 10.2 9.2 8.9 8.9   CBC:  Recent Labs Lab 08/27/16 1929  WBC 49.8*  HGB 10.2*  HCT 29.8*  MCV 65.9*  PLT 356   Cardiac Enzymes:  Recent Labs Lab 08/28/16 0610 08/28/16 1257  TROPONINI 0.08*  0.04* 0.05*   BNP (last 3 results)  Recent Labs  08/28/16 0610  BNP 172.8*   CBG:  Recent Labs Lab 08/29/16 1633 08/29/16 2054 08/30/16 0735 08/30/16 1154 08/30/16 1647  GLUCAP 89 83 94 115* 133*    Studies: No results found.  Scheduled Meds: . docusate sodium  100 mg Oral BID  . folic acid  1 mg Oral Daily  . heparin  5,000 Units Subcutaneous Q8H  . lactulose  10 g Oral BID  . morphine  30 mg Oral Q12H  . nicotine  14 mg Transdermal Daily  . pantoprazole  40 mg Oral Q1200  . tamsulosin  0.4 mg Oral Daily   Continuous Infusions:   Principal Problem:   Chest pain Active Problems:   Diabetes mellitus with ophthalmic complication (HCC)   Alcohol abuse   Essential hypertension, benign   Hyperkalemia   Sepsis ([place])   Non-small cell carcinoma of left lung, stage 4 (HCC)   Cocaine abuse   Tobacco abuse disorder   Acute renal failure superimposed on stage 3 chronic kidney disease (HCC)   Abdominal pain   Leukocytosis   DNR (do not resuscitate)   Palliative care by  specialist    Time spent: 25 minutes    Barton Dubois  [facility] Pager [phone]. If 7PM-7AM, please contact night-coverage at [email or web address], password [facility] 08/30/2016, 7:09 PM  LOS: 2 days

## 2016-08-30 NOTE — Progress Notes (Signed)
CRITICAL VALUE ALERT  Critical Value: lactic 2.6  Date & Time Notied:  08/30/16 0409  Provider Notified: Schorr  Orders Received/Actions taken: 500 cc NS bolus ordered. Bolus given

## 2016-08-30 NOTE — Progress Notes (Signed)
DIAGNOSIS: Stage IV (T2a, N3, M1b) non-small cell lung cancer, squamous cell carcinoma presented with left lower lobe lung mass in April 2016.  PRIOR THERAPY: 1) Systemic chemotherapy with carboplatin for AUC of 5 given on day 1 and gemcitabine 1000 MG/M2 on days 1 and 8 every 3 weeks. Status post 6 cycles with partial response. 2) Nivolumab 240 MG IV every 2 weeks status post 4 cycles, last dose was given 03/18/2015 discontinued today secondary to disease progression. 3) systemic chemotherapy with docetaxel 75 MG/M2 and Cyramza 10 MG/KG every 3 weeks with Neulasta support status post 14 cycles. First dose was given 04/08/2015. Last dose was given 02/17/2016 discontinued secondary to disease progression. 4) chemotherapy with Navelbine 25 MG/M2 every 2 weeks is status post 4 cycles. Last dose was given 04/27/2016 discontinued secondary to disease progression.  Subjective: The patient is seen and examined today. His wife was at the bedside. He is not feeling well with increasing fatigue and weakness as well as significant abdominal distention secondary to disease progression. He had recent CT scan of the abdomen that showed clear evidence for worsening of his disease in the liver with development of ascites. The patient was considered for treatment on a clinical trial but I don't think he would be a good candidate for the trial at this point. He denied having any fever or chills. He has no chest pain but continues to have shortness of breath with exertion.  Objective: Vital signs in last 24 hours: Temp:  [98 F (36.7 C)-98.3 F (36.8 C)] 98 F (36.7 C) (07/16 1358) Pulse Rate:  [112-115] 112 (07/16 1358) Resp:  [16-20] 16 (07/16 1358) BP: (127-136)/(78-90) 132/90 (07/16 1358) SpO2:  [96 %-100 %] 96 % (07/16 1358)  Intake/Output from previous day: 07/15 0701 - 07/16 0700 In: 2300.8 [P.O.:240; I.V.:2060.8] Out: 600 [Urine:600] Intake/Output this shift: Total I/O In: 360 [P.O.:360] Out: 100  [Urine:100]  General appearance: alert, cooperative, fatigued and mild distress Resp: wheezes bilaterally Cardio: regular rate and rhythm, S1, S2 normal, no murmur, click, rub or gallop GI: abnormal findings:  ascites and distended Extremities: extremities normal, atraumatic, no cyanosis or edema  Lab Results:   Recent Labs  08/27/16 1929  WBC 49.8*  HGB 10.2*  HCT 29.8*  PLT 356   BMET  Recent Labs  08/29/16 0502 08/30/16 0307  NA 138 136  K 4.6 5.0  CL 104 103  CO2 23 21*  GLUCOSE 95 86  BUN 35* 38*  CREATININE 2.08* 2.49*  CALCIUM 8.9 8.9    Studies/Results: No results found.  Medications: I have reviewed the patient's current medications.  CODE STATUS: No CODE BLUE  Assessment/Plan: This is a very pleasant 56 years old African-American male with metastatic non-small cell lung cancer, squamous cell carcinoma status post several chemotherapy regimens and was considered recently for clinical trial but unfortunately his condition has deteriorated significantly and the patient has clear evidence for disease progression in the liver as well as development of ascites and abdominal distention. I had a lengthy discussion with the patient and his wife today about his current condition and treatment options. I don't think the patient would be a good candidate for the trial at this point. I strongly recommended for them to consider palliative care and hospice. The patient and his wife are more interested in comfort care at this point. He may need discharge to hospice facility for end-of-life care. For the acites, the patient may benefit from ultrasound-guided paracentesis and drainage of the ascitic  fluid before discharge. Thank you so much for taking good care of [name], I will continue to follow up the patient with you and assist in his management an as-needed basis.  LOS: 2 days    Candace Ramus K. 08/30/2016

## 2016-08-30 NOTE — Consult Note (Signed)
Consultation Note Date: 08/30/2016   Patient Name: Keith Holloway  DOB: [birth year]  MRN: [id number]  Age / Sex: 56 y.o., male  PCP: [name], MD Referring Physician: Barton Dubois, MD  Reason for Consultation: Establishing goals of care and Psychosocial/spiritual support  HPI/Patient Profile: 56 y.o. male   admitted on 08/27/2016 with past  medical history significant of hypertension, hyperlipidemia, diabetes mellitus, polysubstance abuse (tobacco, cocaine and alcohol), pleural effusion, non-small cell lung cancer stage IV with liver metastases, blindness, CKD-3, who presents with chest pain, shortness breath, abdominal pain, nausea and vomiting.  Patient states that he has been having worsening abdominal pain, abdominal distention, nausea and vomiting for about 1 week. Reported continued physical and functional decline over the past week..  CT abdomen/pelvis: 08-27-16  1. Interval progression of metastatic disease with enlargement of the pulmonary nodules. 2. Partially visualized small left pleural effusion with no significant interval change compared to prior study. 3. Hilar and mediastinal adenopathy is, partially visualized, grossly unchanged. 4. Significant interval enlargement of the liver compared to the prior CT with overall progression of metastatic disease. Evaluation of the lesions however are limited in the absence of intravenous contrast. 5. Small ascites, new compared to the prior CT. 6. No bowel obstruction.  Patient  and his family face advanced directive decisions and anticipatory care needs.  Clinical Assessment and Goals of Care:  This NP Wadie Lessen reviewed medical records, received report from team, assessed the patient and then meet at the patient's bedside along with his wife  to discuss diagnosis, prognosis, GOC, EOL wishes disposition and options.  A detailed discussion was had  today regarding advanced directives.  Concepts specific to code status, artifical feeding and hydration, continued IV antibiotics and rehospitalization was had.  The difference between a aggressive medical intervention path  and a palliative comfort care path for this patient at this time was had.  Values and goals of care important to patient and family were attempted to be elicited.  MOST form introduced  Concept of Hospice and Palliative Care were discussed  Natural trajectory and expectations at EOL were discussed.  Questions and concerns addressed.   Family encouraged to call with questions or concerns.  PMT will continue to support holistically.   NEXT OF KIN/wife    SUMMARY OF RECOMMENDATIONS    Patient and his wife await input from oncology regarding any further viable treatment options before making a clear decision to shift to comfort  Code Status/Advance Care Planning:  DNR-documented today and wife support patient's decsion   Symptom Management:   Generalized abdominal pain:   Use current medications for pain control.  Once GOCs decisions are made, will refine pain management strategy depending on decisions regarding transition of care,  home with hospice vs hospice facility.  Palliative Prophylaxis:   Aspiration, Bowel Regimen, Delirium Protocol, Frequent Pain Assessment and Oral Care   Psycho-social/Spiritual:   Desire for further Chaplaincy support:yes  Additional Recommendations: Education on Hospice  Prognosis:   Dependant on decision for life prolonging interventions  Discharge Planning: To Be Determined      Primary Diagnoses: Present on Admission: . Alcohol abuse . Cocaine abuse . Diabetes mellitus with ophthalmic complication ([place]) . Essential hypertension, benign . Non-small cell carcinoma of left lung, stage 4 ([place]) . Hyperkalemia . Acute renal failure superimposed on stage 3 chronic kidney disease ([place]) . Abdominal pain . Leukocytosis .  Sepsis ([place]) . Tobacco abuse disorder . Chest pain   I have reviewed the medical record, interviewed the patient and family, and examined the patient. The following aspects are pertinent.  Past Medical History:  Diagnosis Date  . Acute hyperglycemia 11/26/2015  . Alcohol abuse   . Arthritis   . Blind   . Borderline hypertension   . CKD (chronic kidney disease), stage III   . Cocaine abuse    last [employer] postiv[employer] for cocaine 09-30-2015  . Depression   . Dyspnea    with exertion   . Dyspnea on effort   . Elevated blood sugar 11/26/2015  . Goals of care, counseling/discussion 03/09/2016  . History of acute renal failure    09-30-2015  . History of blood transfusion   . History of hyperkalemia    admission 09-30-2015 potassiom 8.0  with medications currently 10-14-2015 down to 4.7 normal  . History of traumatic head injury    1987  w/ LOC   --- no residual  . Hyperglycemia 02/17/2016  . Hyperlipidemia   . Hypertension 12/16/2015  . 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 Memory disorder 08/22/2013   alcohol abuse  . Migraine   . Nocturia   . Non-small cell carcinoma of lung, stage 4 [facility]) oncologist-  dr Julien Nordmann   w/ METs to liver dx 04/ 2016--  Stage IV (T2a, N3, M1b), squamous cell carcinoma of left lower lobe---  systemic chemotherapy every 3 weeks and currently due to evidence for disease progression has started immunotherapy  . Pilonidal abscess    recurrent  . Pleural effusion, bilateral    per ct 10-09-2015  new small right effusion and left small to moderate effusion , increased  . Polyneuropathy in diabetes(357.2)   . Port-a-cath in place   . Pulmonary nodules/lesions, multiple    pt has stage 4 lung cancer  . Recurrent left pleural effusion 01/06/2016  . Tobacco abuse counseling 10/09/2014  . Type 2 diabetes mellitus with insulin therapy ([place])   . Wears dentures    upper   Social History   Social History  .  Marital status: Married    Spouse name: Linton Rump  . Number of children: 0  . Years of education: 13   Occupational History  . Disabled    Social History Main Topics  . Smoking status: Former Smoker    Packs/day: 0.25    Years: 32.00    Types: Cigarettes    Quit date: 10/01/2015  . Smokeless tobacco: Never Used     Comment: recently stopped smoking uses nicoderm patch  . Alcohol use Yes     Comment: alcoholic  . Drug use: Yes    Types: Cocaine     Comment: last  UDS 09-30-2015 positive cocaine  . Sexual activity: Not Asked   Other Topics Concern  . None   Social History Narrative   Patient is married Linton Rump) and lives at home with his wife.   Patient is disabled.   Patient has a high school education.   Patient is right-handed.   Patient  drinks 3-4 cups of coffee most days.   Family History  Problem Relation Age of Onset  . Alzheimer's disease Brother   . Cancer Father   . Hypertension Father   . Heart attack Father   . Hypertension Mother   . Gout Mother   . Diabetes Sister   . Hypertension Sister   . Hypertension Sister   . Hypertension Sister    Scheduled Meds: . amLODipine  10 mg Oral Daily  . aspirin EC  81 mg Oral q morning - 10a  . atorvastatin  20 mg Oral q1800  . cholecalciferol  1,000 Units Oral Daily  . docusate sodium  100 mg Oral BID  . ferrous [id number] C-folic acid  1 capsule Oral q morning - 10a  . ferrous sulfate  325 mg Oral Q breakfast  . folic acid  1 mg Oral Daily  . heparin  5,000 Units Subcutaneous Q8H  . insulin aspart  0-9 Units Subcutaneous TID WC  . insulin aspart protamine- aspart  7 Units Subcutaneous BID WC  . lactulose  10 g Oral BID  . morphine  30 mg Oral Q12H  . nicotine  14 mg Transdermal Daily  . pantoprazole  40 mg Oral Q1200  . tamsulosin  0.4 mg Oral Daily  . thiamine  100 mg Oral Daily  . vitamin C  500 mg Oral BID   Continuous Infusions: . sodium chloride 100 mL/hr at 08/29/16 1055   PRN  Meds:.acetaminophen, albuterol, ALPRAZolam, hydrALAZINE, lidocaine-prilocaine, morphine injection, nitroGLYCERIN, ondansetron, oxyCODONE, sodium chloride flush, zolpidem Medications Prior to Admission:  Prior to Admission medications   Medication Sig Start Date End Date Taking? Authorizing Provider  acetaminophen (TYLENOL) 500 MG tablet Take 500 mg by mouth every 6 (six) hours as needed for moderate pain.   Yes [provider]  amLODipine (NORVASC) 10 MG tablet Take 1 tablet (10 mg total) by mouth daily. 11/19/15  Yes Barton Dubois, MD  aspirin EC 81 MG tablet Take 1 tablet by mouth every morning.    Yes [provider]  Cholecalciferol (VITAMIN D-3) 1000 units CAPS Take 1,000 Units by mouth daily.   Yes [provider]  docusate sodium (COLACE) 100 MG capsule Take 1 capsule (100 mg total) by mouth 2 (two) times daily. 11/19/15  Yes Barton Dubois, MD  FeFum-FePoly-FA-B Cmp-C-Biot (INTEGRA PLUS) CAPS Take 1 capsule by mouth every morning. 07/26/16  Yes Curt Bears, MD  ferrous sulfate 325 (65 FE) MG tablet Take 325 mg by mouth daily with breakfast.    Yes [provider]  insulin NPH-regular Human (NOVOLIN 70/30) (70-30) 100 UNIT/ML injection Inject 10 Units into the skin 2 (two) times daily with a meal. 08/08/16  Yes Rosita Fire, MD  lactulose [facility]) 10 GM/15ML solution TAKE 30 MLS TWICE A DAY 08/16/16  Yes Curt Bears, MD  lidocaine-prilocaine (EMLA) cream APPLY 1 APPLICATION TOPICALLY AS NEEDED. 02/17/16  Yes Curt Bears, MD  morphine (MS CONTIN) 30 MG 12 hr tablet Take 1 tablet (30 mg total) by mouth every 12 (twelve) hours. 08/20/16  Yes Ladell Pier, MD  nicotine (NICODERM CQ) 14 mg/24hr patch Place 1 patch (14 mg total) onto the skin daily. 07/28/16  Yes Curt Bears, MD  ondansetron (ZOFRAN) 8 MG tablet TAKE 1 TABLET EVERY 8 HOURS AS NEEDED FOR NAUSEA AND VOMITING 08/25/16  Yes Curt Bears, MD  oxyCODONE (OXY IR/ROXICODONE)  5 MG immediate release tablet Take 1 tablet (5 mg total) by mouth every 6 (six)  hours as needed for severe pain. 08/20/16 10/19/16 Yes Ladell Pier, MD  simvastatin (ZOCOR) 40 MG tablet Take 1 tablet (40 mg total) by mouth daily. Patient taking differently: Take 40 mg by mouth every morning.  01/23/14  Yes Advani, Vernon Prey, MD  tamsulosin (FLOMAX) 0.4 MG CAPS capsule Take 1 capsule (0.4 mg total) by mouth daily. 08/09/16  Yes Rosita Fire, MD  vitamin C (ASCORBIC ACID) 500 MG tablet Take 500 mg by mouth 2 (two) times daily.    Yes [provider]  acetaminophen (TYLENOL) 325 MG tablet Take 2 tablets (650 mg total) by mouth every 6 (six) hours as needed for mild pain (or Fever >/= 101). Patient not taking: Reported on 08/06/2016 10/02/15   Lavina Hamman, MD  lactulose [facility]) 10 GM/15ML solution TAKE 30 MLS TWICE A DAY Patient not taking: Reported on 08/27/2016 08/17/16   Curt Bears, MD   No Known Allergies Review of Systems  Gastrointestinal: Positive for abdominal distention and abdominal pain.    Physical Exam  Constitutional: He is oriented to person, place, and time. He appears cachectic. He appears ill.  HENT:  Mouth/Throat: Oropharynx is clear and moist. Abnormal dentition.  Cardiovascular: Tachycardia present.   Pulmonary/Chest: He has decreased breath sounds in the right lower field and the left lower field.  Abdominal: He exhibits distension. There is generalized tenderness.  Neurological: He is alert and oriented to person, place, and time.  Skin: Skin is warm and dry.    Vital Signs: BP 135/84   Pulse (!) 113   Temp 98.2 F (36.8 C) (Oral)   Resp 18   Ht 5\' 11"  (1.803 m)   Wt 70.3 kg (154 lb 15.7 oz)   SpO2 99%   BMI 21.62 kg/m  Pain Assessment: 0-10   Pain Score: 9    SpO2: SpO2: 99 % O2 Device:SpO2: 99 % O2 Flow Rate: .O2 Flow Rate (L/min): 2.5 L/min  IO: Intake/output summary:  Intake/Output Summary (Last 24 hours) at 08/30/16  1110 Last data filed at 08/30/16 0855  Gross per 24 hour  Intake             2080 ml  Output              600 ml  Net             1480 ml    LBM: Last BM Date: 08/28/16 Baseline Weight: Weight: 70.5 kg (155 lb 6.4 oz) Most recent weight: Weight: 70.3 kg (154 lb 15.7 oz)     Palliative Assessment/Data: 30 % at best   Discussed with Dr Dyann Kief and Loel Ro RN with Dr Earlie Server  Time In: 1000 Time Out: 1115 Time Total: 75 min Greater than 50%  of this time was spent counseling and coordinating care related to the above assessment and plan.  Signed by: Wadie Lessen, NP   Please contact Palliative Medicine Team phone at [phone] for questions and concerns.  For individual provider: See Shea Evans

## 2016-08-31 ENCOUNTER — Inpatient Hospital Stay (HOSPITAL_COMMUNITY): Payer: Medicare Other

## 2016-08-31 DIAGNOSIS — Z66 Do not resuscitate: Secondary | ICD-10-CM

## 2016-08-31 DIAGNOSIS — R180 Malignant ascites: Secondary | ICD-10-CM

## 2016-08-31 LAB — HEMOGLOBIN A1C
HEMOGLOBIN A1C: 6.2 % — AB (ref 4.8–5.6)
Mean Plasma Glucose: 131 mg/dL

## 2016-08-31 MED ORDER — HEPARIN SOD (PORK) LOCK FLUSH 100 UNIT/ML IV SOLN
500.0000 [IU] | INTRAVENOUS | Status: AC | PRN
  Administered 2016-08-31: 500 [IU]

## 2016-08-31 MED ORDER — BISACODYL 10 MG RE SUPP: 10.0000 mg | Freq: Every day | RECTAL | 0 refills | Status: AC | PRN

## 2016-08-31 MED ORDER — ALBUTEROL SULFATE (2.5 MG/3ML) 0.083% IN NEBU: 2.5000 mg | INHALATION_SOLUTION | RESPIRATORY_TRACT | 12 refills | Status: AC | PRN

## 2016-08-31 MED ORDER — MORPHINE SULFATE (PF) 4 MG/ML IV SOLN: 2.0000 mg | INTRAVENOUS | Status: AC | PRN

## 2016-08-31 MED ORDER — PANTOPRAZOLE SODIUM 40 MG PO TBEC: 40.0000 mg | DELAYED_RELEASE_TABLET | Freq: Every day | ORAL | Status: AC

## 2016-08-31 MED ORDER — HALOPERIDOL 0.5 MG PO TABS: 0.5000 mg | ORAL_TABLET | ORAL | Status: AC | PRN

## 2016-08-31 MED ORDER — BISACODYL 10 MG RE SUPP: 10.0000 mg | Freq: Every day | RECTAL | Status: DC | PRN

## 2016-08-31 MED ORDER — LORAZEPAM 1 MG PO TABS: 1.0000 mg | ORAL_TABLET | ORAL | Status: DC | PRN

## 2016-08-31 MED ORDER — LORAZEPAM 1 MG PO TABS: 1.0000 mg | ORAL_TABLET | ORAL | Status: AC | PRN

## 2016-08-31 MED ORDER — GLYCOPYRROLATE 1 MG PO TABS: 1.0000 mg | ORAL_TABLET | ORAL | Status: AC | PRN

## 2016-08-31 NOTE — Progress Notes (Signed)
[facility] Liaison:  RN  Spoke with Joelene Millin, CSW, she is still trying to get MD to complete discharge summary for patient to be released to [facility].   Joelene Millin, [place], to follow up with [facility] to advise.   Thank you for the referral,  Edyth Gunnels, RN, BSN [facility] Liaison [phone]  All hospital liaisons are now on [place].

## 2016-08-31 NOTE — Progress Notes (Addendum)
Report called to carol, RN at residential hospice. Port left accessed per MD order. Date of access given to RN (7/13), RN states they can maintain and deaccess per protocols.  Keith Holloway. Brigitte Pulse, RN

## 2016-08-31 NOTE — Progress Notes (Signed)
Patient to radiology department for possible paracentesis.  Limited US Abdomen performed; results dictated separately.   Trace amount of abdominal fluid present, not amenable to to paracentesis.   Brynda Greathouse, MMS RDN PA-C 4:29 PM

## 2016-08-31 NOTE — Discharge Summary (Signed)
Physician Discharge Summary  Keith Holloway XWR:[id number] DOB: [birth year] DOA: 08/27/2016  PCP: [name], MD  Admit date: 08/27/2016 Discharge date: 08/31/2016  Time spent: 35 minutes  Recommendations for Outpatient Follow-up:  1. Full comfort Care   Discharge Diagnoses:  Principal Problem:   Chest pain Active Problems:   Diabetes mellitus with ophthalmic complication (HCC)   Alcohol abuse   Essential hypertension, benign   Hyperkalemia   Sepsis (HCC)   Non-small cell carcinoma of left lung, stage 4 (HCC)   Cocaine abuse   Tobacco abuse disorder   Acute renal failure superimposed on stage 3 chronic kidney disease (HCC)   Abdominal pain   Leukocytosis   DNR (do not resuscitate)   Palliative care by specialist   Discharge Condition: stable and in no acute distress. Will discharge to [facility] place for symptoms management and end of life care.  Diet recommendation: comfort feeding   Filed Weights   08/28/16 0242 08/28/16 0500  Weight: 70.5 kg (155 lb 6.4 oz) 70.3 kg (154 lb 15.7 oz)    Brief History of present illness:  56 y.o.malewith medical history significant of hypertension, hyperlipidemia, diabetes mellitus, polysubstance abuse (tobacco, cocaine and alcohol), pleural effusion, non-small cell lung cancer stage IV with liver metastases, blindness, CKD-3, who presents with chest pain, shortness breath, abdominal pain, nausea and vomiting.  Hospital Course:  1-chest pain: atypical, but with heart score of 4 -mild elevation of troponin; which could be secondary to renal failure -still with some atypical intermittent chest discomfort; mainly affecting right side. Most likely from further spread of his metastatic disease. -no abnormalities of ischemia on EKG or telemetry -2-D echo reassuring; and currently given renal function he will not be a candidate for cath anyway.  -unable to stay still to check V/Q scan and unable to have CTA due to abnormal renal failure. -after  discussing with family and given patient's active decline, they have opted for pursuing only comfort care. -oncology service and palliative care in agreement -will pursuit hospice placement, symptom management and end of life care.   2-Sepsis -no final source found -will discontinue antibiotics -focusing on comfort care only -no further blood draws  -patient has remained afebrile.  3-abd pain -CT demonstrated mild ascites and progression metastasis into his liver -with fluid resuscitation; his ascites has continue progressing and is making him very uncomfortable  -will ask for palliative paracentesis -stop IVF's -continue adjusted pain meds  4-type 2 diabetes -last A1C 7.1 -will now focus on full comfort care -no further CBG's or insuline therapy  5-HTN -will monitor VS -no further antihypertensive regimen -focusing just in comfort care.  6-alcohol and tobacco abuse -cessation counseling provided -will focus now on comfort care.  7-Hyperkalemia -resolved with use of kayexalate and insulin  -will now focus on comfort care -no further blood draws will be made  8-non-small cell lung cancer: stage 4 -oncology service notified -images done on admission suggesting progression of disease and especially expansion on metastatic lesions to the liver. -following rec's will pursuit hospice placement, symptom management and end of life care.  9-acute on chronic renal failure: stage 3 at baseline -Cr baseline 1-1.6 -on admission 2.35; worsen to 2.49 on last blood work -appears to be secondary to dehydration and pre-renal azotemia -will now focus on comfort care -no further blood draws.  10-N/V -continue PRN antiemetics   Procedures:  See below for x-ray reports   2-D echo - Left ventricle: The cavity size was normal. There was mild concentric hypertrophy.  Systolic function was vigorous. The estimated ejection fraction was in the range of 65% to 70%.  Wall motion was normal; there were no regional wall motion abnormalities. The study is not technically sufficient to allow evaluation of LV diastolic function. - Right atrium: There was the appearance of a Chiari network. With bacteremia, consider TEE to exclude vegetation (image 56).  Consultations:  Oncology  Palliative Care  Discharge Exam: Vitals:   08/31/16 0410 08/31/16 1341  BP: 124/75 (!) 147/82  Pulse: (!) 124 (!) 129  Resp: 18 17  Temp: 98.8 F (37.1 C) 98.9 F (37.2 C)    General:  Afebrile, no nausea, no vomiting. Continue having pain on his right side and also with increase abd pain and distension. BP is stable.  Cardiovascular: tachycardic, S1 and S2, no gallops, no rubs  Respiratory: positive tachypnea, no crackles, no wheezing   Abdomen: worse abd distension, patient complaining of worse right side pain  Musculoskeletal: trace edema, no cyanosis   Discharge Instructions    Current Discharge Medication List    START taking these medications   Details  albuterol (PROVENTIL) (2.5 MG/3ML) 0.083% nebulizer solution Take 3 mLs (2.5 mg total) by nebulization every 4 (four) hours as needed for wheezing or shortness of breath. Qty: 75 mL, Refills: 12    bisacodyl (DULCOLAX) 10 MG suppository Place 1 suppository (10 mg total) rectally daily as needed for mild constipation. Qty: 12 suppository, Refills: 0    glycopyrrolate (ROBINUL) 1 MG tablet Take 1 tablet (1 mg total) by mouth every 4 (four) hours as needed (excessive secretions).    haloperidol (HALDOL) 0.5 MG tablet Take 1 tablet (0.5 mg total) by mouth every 4 (four) hours as needed for agitation (or delirium).    LORazepam (ATIVAN) 1 MG tablet Take 1 tablet (1 mg total) by mouth every 4 (four) hours as needed for anxiety.    morphine 4 MG/ML injection Inject 0.5 mLs (2 mg total) into the vein every 2 (two) hours as needed for moderate pain or severe pain.    pantoprazole (PROTONIX) 40 MG  tablet Take 1 tablet (40 mg total) by mouth daily.      CONTINUE these medications which have NOT CHANGED   Details  acetaminophen (TYLENOL) 500 MG tablet Take 500 mg by mouth every 6 (six) hours as needed for moderate pain.    lactulose (CHRONULAC) 10 GM/15ML solution TAKE 30 MLS TWICE A DAY Qty: 473 mL, Refills: 1    lidocaine-prilocaine (EMLA) cream APPLY 1 APPLICATION TOPICALLY AS NEEDED. Qty: 30 g, Refills: 0    morphine (MS CONTIN) 30 MG 12 hr tablet Take 1 tablet (30 mg total) by mouth every 12 (twelve) hours. Qty: 60 tablet, Refills: 0    ondansetron (ZOFRAN) 8 MG tablet TAKE 1 TABLET EVERY 8 HOURS AS NEEDED FOR NAUSEA AND VOMITING Qty: 20 tablet, Refills: 1   Associated Diagnoses: Non-small cell carcinoma of lung, stage 4, left (HCC)    oxyCODONE (OXY IR/ROXICODONE) 5 MG immediate release tablet Take 1 tablet (5 mg total) by mouth every 6 (six) hours as needed for severe pain. Qty: 30 tablet, Refills: 0    tamsulosin (FLOMAX) 0.4 MG CAPS capsule Take 1 capsule (0.4 mg total) by mouth daily. Qty: 30 capsule, Refills: 0      STOP taking these medications     amLODipine (NORVASC) 10 MG tablet      aspirin EC 81 MG tablet      Cholecalciferol (VITAMIN D-3) 1000 units CAPS  docusate sodium (COLACE) 100 MG capsule      FeFum-FePoly-FA-B Cmp-C-Biot (INTEGRA PLUS) CAPS      ferrous sulfate 325 (65 FE) MG tablet      insulin NPH-regular Human (NOVOLIN 70/30) (70-30) 100 UNIT/ML injection      nicotine (NICODERM CQ) 14 mg/24hr patch      simvastatin (ZOCOR) 40 MG tablet      vitamin C (ASCORBIC ACID) 500 MG tablet        No Known Allergies   The results of significant diagnostics from this hospitalization (including imaging, microbiology, ancillary and laboratory) are listed below for reference.    Significant Diagnostic Studies: Ct Abdomen Pelvis Wo Contrast  Result Date: 08/27/2016 CLINICAL DATA:  56 year old male with abdominal pain. History of left  lung cancer. EXAM: CT ABDOMEN AND PELVIS WITHOUT CONTRAST TECHNIQUE: Multidetector CT imaging of the abdomen and pelvis was performed following the standard protocol without IV contrast. COMPARISON:  Abdominal CT dated 05/06/2016 FINDINGS: Evaluation of this exam is limited in the absence of intravenous contrast. Lower chest: There is a small left pleural effusion. There is a 2.1 x 1.7 cm lesion in the superior segment of the left lower lobe abutting the fissure which is increased in size compared the prior CT (previously measuring 1.1 x 1.0 cm). A 1.2 x 0.9 cm nodule at the left lung base appears new or increased compared to prior CT. Multiple small nodular density along the minor fissure measure up to 5 mm and appear new compared to the prior CT. There is a 3 mm nodule at the right lung base (series 3, image 6). Partially visualized bilateral hilar adenopathy. Subcarinal and mediastinal adenopathy. Partially visualized bilobed masses along the left pericardium measure 5.5 x 2.2 cm. There are bibasilar streaky densities, likely atelectasis, although infiltrate is not excluded. No intra-abdominal free air. Small ascites, new compared to the prior CT. Hepatobiliary: The liver is enlarged. There are multiple ill-defined hypoechoic liver masses consistent with metastatic disease. There has been significant interval increase in the size of the left lobe of the liver compared to the prior CT. No intrahepatic biliary dilatation. The gallbladder is contracted. Pancreas: The pancreas is unremarkable. Spleen: Normal in size without focal abnormality. Adrenals/Urinary Tract: The kidneys, visualized ureters, and urinary bladder appear unremarkable. Stomach/Bowel: No evidence of bowel obstruction. The appendix is not identified with certainty. Vascular/Lymphatic: There is mild aortoiliac atherosclerotic disease. Evaluation of the abdominal aorta and IVC is limited in the absence of intravenous contrast. No portal venous gas  identified. No adenopathy. Reproductive: The prostate and seminal vesicles are grossly unremarkable. Other: Mild diffuse subcutaneous edema and anasarca. No fluid collection. Musculoskeletal: No acute or significant osseous findings. Partially visualized sclerotic changes of the sternum as seen on the prior CT may be related to radiation changes. IMPRESSION: 1. Interval progression of metastatic disease with enlargement of the pulmonary nodules. 2. Partially visualized small left pleural effusion with no significant interval change compared to prior study. 3. Hilar and mediastinal adenopathy is, partially visualized, grossly unchanged. 4. Significant interval enlargement of the liver compared to the prior CT with overall progression of metastatic disease. Evaluation of the lesions however are limited in the absence of intravenous contrast. 5. Small ascites, new compared to the prior CT. 6. No bowel obstruction. Electronically Signed   By: Anner Crete M.D.   On: 08/27/2016 22:37   Dg Chest 2 View  Result Date: 08/27/2016 CLINICAL DATA:  Cough, mid chest pain worse today, new abdominal distension, hematuria,  history lung cancer (NSCLC stage IV), type II diabetes mellitus, CHF, chronic kidney disease, essential benign hypertension EXAM: CHEST  2 VIEW COMPARISON:  CT chest 05/07/2013 FINDINGS: RIGHT jugular Port-A-Cath with tip projecting over cavoatrial junction. Normal heart size and pulmonary vascularity. Abnormal density projecting at AP window/hilum on AP view, by prior CT in anterior LEFT upper lobe/mediastinum. Bibasilar atelectasis. Rounded opacity at LEFT lung base question metastasis 20 x 20 mm in size. No definite infiltrate, pleural effusion, or pneumothorax. No acute osseous findings. IMPRESSION: Bibasilar atelectasis with 20 x 20 mm nodular density at LEFT lung base question pulmonary metastasis. Known LEFT upper lobe/mediastinal tumor. Electronically Signed   By: Lavonia Dana M.D.   On: 08/27/2016  18:25   US Renal  Result Date: 08/07/2016 CLINICAL DATA:  Chronic kidney disease. Dysuria. Liver biopsy 2 days ago. Lung cancer with liver metastases. EXAM: RENAL / URINARY TRACT ULTRASOUND COMPLETE COMPARISON:  CT 05/06/2016 FINDINGS: Right Kidney: Length: 11.0 cm. Echogenicity within normal limits. No mass or hydronephrosis visualized. Left Kidney: Length: 12.1 cm. Echogenicity within normal limits. No mass or hydronephrosis visualized. Bladder: Appears normal for degree of bladder distention. Bilateral ureteral jets visualized. Incidentally noted is a small amount of ascites. IMPRESSION: Normal size kidneys without hydronephrosis. Small amount of ascites. Electronically Signed   By: Marin Olp M.D.   On: 08/07/2016 11:19   US Abdomen Limited  Result Date: 08/31/2016 CLINICAL DATA:  Evaluate for ascites EXAM: LIMITED ABDOMEN ULTRASOUND FOR ASCITES TECHNIQUE: Limited ultrasound survey for ascites was performed in all four abdominal quadrants. COMPARISON:  None. FINDINGS: There is minimal free fluid in the abdomen. IMPRESSION: Minimal ascites.  Paracentesis was not performed. Electronically Signed   By: Marybelle Killings M.D.   On: 08/31/2016 16:38   US Biopsy  Result Date: 08/05/2016 CLINICAL DATA:  Lung carcinoma. Progressive liver lesions consistent metastatic disease. EXAM: ULTRASOUND GUIDED CORE BIOPSY OF LIVER LESION MEDICATIONS: Intravenous Fentanyl and Versed were administered as conscious sedation during continuous monitoring of the patient's level of consciousness and physiological / cardiorespiratory status by the radiology RN, with a total moderate sedation time of 13 minutes. PROCEDURE: The procedure, risks, benefits, and alternatives were explained to the patient. Questions regarding the procedure were encouraged and answered. The patient understands and consents to the procedure. . Survey ultrasound of the liver was performed. A representative lesion in the left lobe was localized and an  appropriate skin entry site was determined and marked. The operative field was prepped with chlorhexidine in a sterile fashion, and a sterile drape was applied covering the operative field. A sterile gown and sterile gloves were used for the procedure. Local anesthesia was provided with 1% Lidocaine. Under real-time ultrasound guidance, a 17 gauge trocar needle was advanced to the margin of the lesion. Once needle tip position was confirmed, coaxial 18-gauge core biopsy samples were obtained, submitted in formalin to surgical pathology. The guide needle was removed. Postprocedure scans show no hemorrhage or other apparent complication. The patient tolerated the procedure well. COMPLICATIONS: None. FINDINGS: Multiple liver lesions are identified corresponding to findings on recent CT. Representative core biopsy samples of a left lobe lesion obtained as above. IMPRESSION: 1. Technically successful CT-guided core biopsy, left liver lesion. Electronically Signed   By: Lucrezia Europe M.D.   On: 08/05/2016 14:00    Microbiology: Recent Results (from the past 240 hour(s))  Culture, blood (x 2)     Status: None (Preliminary result)   Collection Time: 08/28/16  7:20 AM  Result  Value Ref Range Status   Specimen Description BLOOD RIGHT ANTECUBITAL  Final   Special Requests IN PEDIATRIC BOTTLE Blood Culture adequate volume  Final   Culture   Final    NO GROWTH 3 DAYS Performed at [facility], [place] [address], [place], [place] [address]    Report Status PENDING  Incomplete  Culture, blood (x 2)     Status: None (Preliminary result)   Collection Time: 08/28/16  7:22 AM  Result Value Ref Range Status   Specimen Description BLOOD BLOOD LEFT ARM  Final   Special Requests   Final    BOTTLES DRAWN AEROBIC AND ANAEROBIC Blood Culture adequate volume   Culture   Final    NO GROWTH 3 DAYS Performed at [facility], [address], [place], [place] [address]    Report Status PENDING  Incomplete      Labs: Basic Metabolic Panel:  Recent Labs Lab 08/27/16 1929 08/28/16 0849 08/29/16 0502 08/30/16 0307  NA 134* 135 138 136  K 6.0* 5.1 4.6 5.0  CL 98* 101 104 103  CO2 23 22 23  21*  GLUCOSE 110* 123* 95 86  BUN 39* 37* 35* 38*  CREATININE 2.35* 2.05* 2.08* 2.49*  CALCIUM 10.2 9.2 8.9 8.9   CBC:  Recent Labs Lab 08/27/16 1929  WBC 49.8*  HGB 10.2*  HCT 29.8*  MCV 65.9*  PLT 356   Cardiac Enzymes:  Recent Labs Lab 08/28/16 0610 08/28/16 1257  TROPONINI 0.08*  0.04* 0.05*   BNP: BNP (last 3 results)  Recent Labs  08/28/16 0610  BNP 172.8*   CBG:  Recent Labs Lab 08/29/16 2054 08/30/16 0735 08/30/16 1154 08/30/16 1647 08/30/16 2203  GLUCAP 83 94 115* 133* 110*    Signed:  Barton Dubois MD.  [facility] 08/31/2016, 5:03 PM

## 2016-08-31 NOTE — Progress Notes (Signed)
Pt picked up at 2030 by [facility] and transported to [facility].

## 2016-08-31 NOTE — Progress Notes (Signed)
[facility] Liaison: RN visit  Received request from [place] for family interest in [facility] with request for transfer today. Chart reviewed. Met with patient and family to confirm interest and explain services. Family agreeable to transfer today. CSW aware. Registration paperwork completed. Dr. Orpah Melter to assume care per family request. Please fax discharge summary to [phone]. RN please call report to [phone]. Please arrange transport for patient to arrive as soon as possible.  Thank you  [facility] Liaison [phone]  All Hospital Liaisons are now on [place].

## 2016-08-31 NOTE — Progress Notes (Signed)
CSW received consult for residential hospice placement. CSW spoke with patient's wife and sister at bedside. Patient's wife confirmed they are interested in Residential Hospice at [facility]. [facility] Liaison aware. CSW will follow up with [facility] Liaison to inquire about patient's ability to be accepted. CSW will continue to follow and assist with discharge planning.  Abundio Miu, [place] Social Worker [facility] Cell#: [phone]

## 2016-08-31 NOTE — Progress Notes (Signed)
Patient accepted and going to [facility]. CSW coordinated transportation and notified patient's family. CSW faxed discharge summary to [facility]. CSW signing off, no other needs identified at this time.  Abundio Miu, [place] Social Worker [facility] Cell#: [phone]

## 2016-08-31 NOTE — Progress Notes (Signed)
Patient ID: Jalil Lorusso, male   DOB: [birth year], 56 y.o.   MRN: [id number]  This NP visited patient at the bedside, along with sister and step-daughter as a follow up to  yesterday's GOCs meeting.  After discussion with Dr Earlie Server yesterday afternoon, patient and family are comfortable with decision  for full comfort path and no further life prolonging measures   Goal is comfort.  -liberalize pain medications -minimize unnecessay medications -dc diagnostics   Discussed natural trajectory and expectations at EOL.  Patient shared his understanding that he is at EOL and looking forward to "a better place".  Questions and concerns addressed.  Emotional support offered.  Discussed with Dr Dyann Kief  Time in    0800       Time out    0835  Total time spent on the unit was 35 min  Greater than 50% of the time was spent in counseling and coordination of care  Wadie Lessen NP  Palliative Medicine Team Team Phone # [phone] Pager [phone]

## 2016-09-02 LAB — CULTURE, BLOOD (ROUTINE X 2)
CULTURE: NO GROWTH
Culture: NO GROWTH
SPECIAL REQUESTS: ADEQUATE
Special Requests: ADEQUATE

## 2016-09-07 ENCOUNTER — Telehealth: Payer: Self-pay | Admitting: *Deleted

## 2016-09-07 ENCOUNTER — Encounter: Payer: Self-pay | Admitting: *Deleted

## 2016-09-07 NOTE — Telephone Encounter (Signed)
Clinical Research Note S-[id number]:  Patient admitted to hospital on 08/27/16 due to decline in condition.  See hospital notes.  Notification received from main study of assignment to sub-study S1400-F on 08/28/16.  Dr. Julien Nordmann made decision to not enroll patient on sub-study on 08/30/16 due to declining condition and decision by patient/ family for comfort care only.  Patient was transferred to [facility] for end of life care on 08/31/16 and this nurse was notified today that patient passed away on 09/17/2016.  Research labs for optional correlative and banking studies were not collected for the S1400 study due to patient's death. (Plan was to collect labs at next clinic visit which was not able to happen).  Called patient's wife to offer my condolences and thank her for her husband's participation in this study.  Wife was appreciative of the call and wanted nurse to notify Dr. Julien Nordmann of patients death and thank him and staff for all their care the past few years.  Notified Dr. Julien Nordmann of patient's death and wife's message of thanks.  Copy of FM labs given to Dr. Julien Nordmann for his information.  Foye Spurling, BSN, RN Clinical Research Nurse 09/07/2016 11:25 AM

## 2016-09-15 DEATH — deceased

## 2016-09-29 ENCOUNTER — Other Ambulatory Visit: Payer: Self-pay | Admitting: Nurse Practitioner

## 2017-12-24 IMAGING — CT CT ABD-PELV W/O CM
2 of 4 series · 15 of 46 positions shown, 17 images · non-contrast
Comparison: Abdominal CT dated 05/06/2016

CLINICAL DATA: 56-year-old male with abdominal pain. History of
left lung cancer.

EXAM:
CT ABDOMEN AND PELVIS WITHOUT CONTRAST
TECHNIQUE: Multidetector CT imaging of the abdomen and pelvis was performed
following the standard protocol without IV contrast.

[Series 2: abd/pel w/o · axial · non-contrast · 0.77mm/px · z∈[+1142,+1582]mm · 12 of 100 slices shown, 14 images]
[im 6/100  soft-tissue]
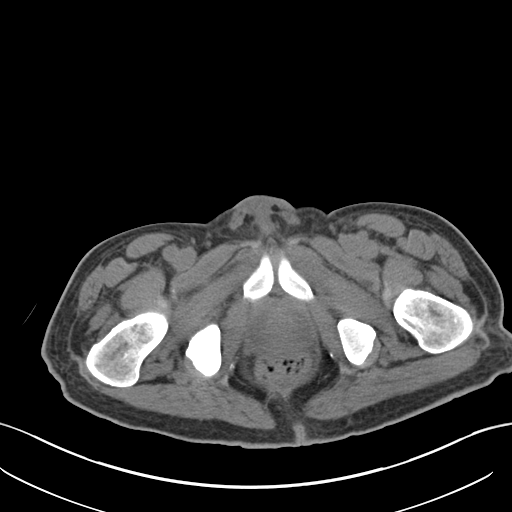
[im 6/100  bone]
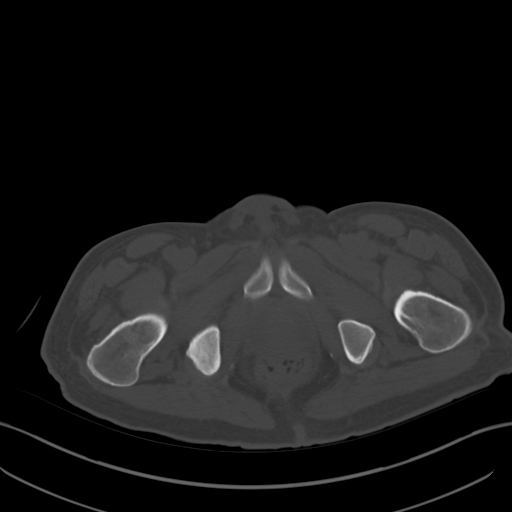
[im 16/100  soft-tissue]
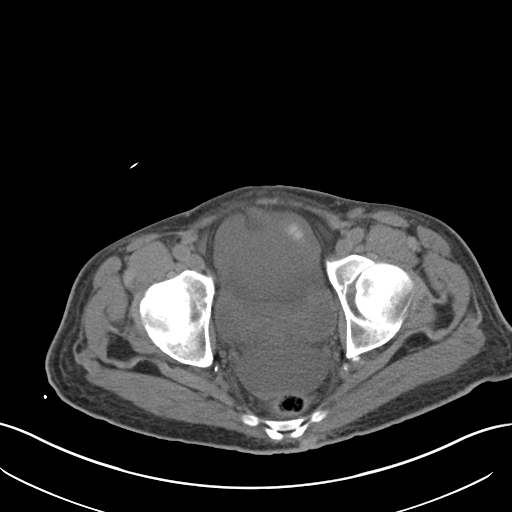
[im 21/100  soft-tissue]
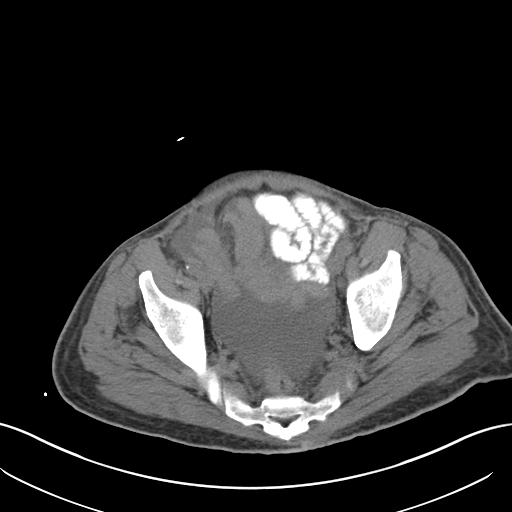
[im 32/100  soft-tissue]
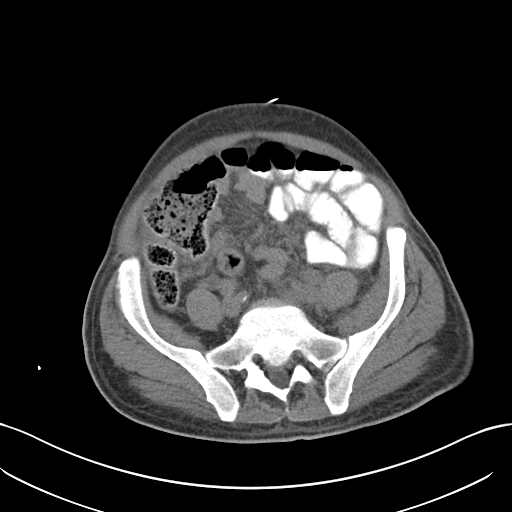
[im 37/100  soft-tissue]
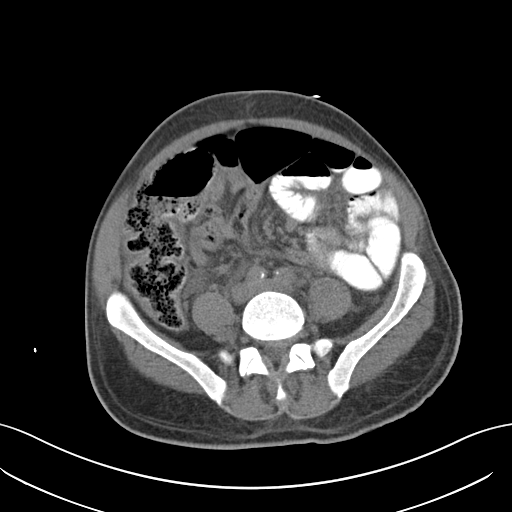
[im 47/100  soft-tissue]
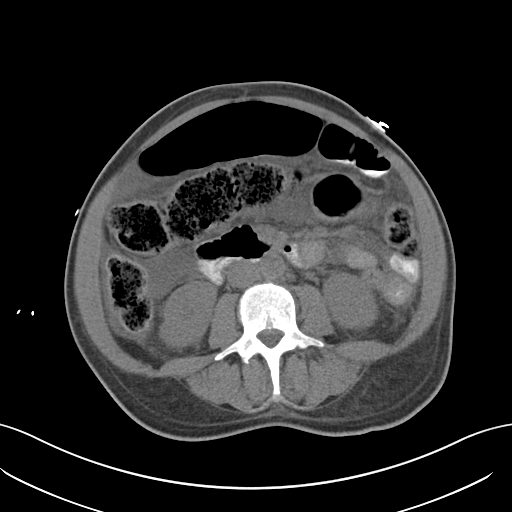
[im 53/100  soft-tissue]
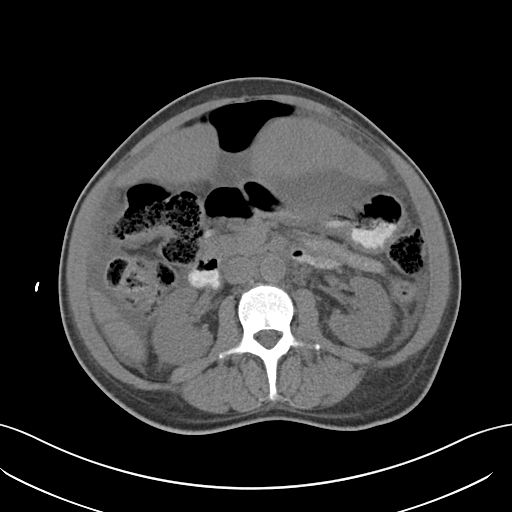
[im 63/100  soft-tissue]
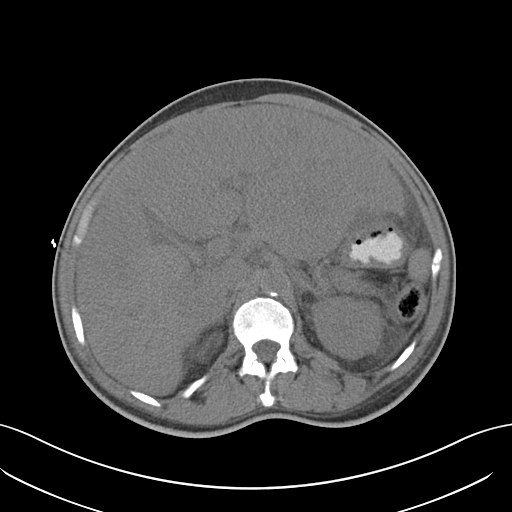
[im 68/100  soft-tissue]
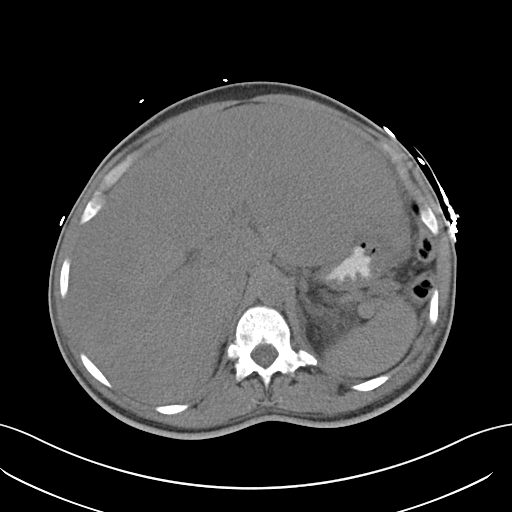
[im 68/100  bone]
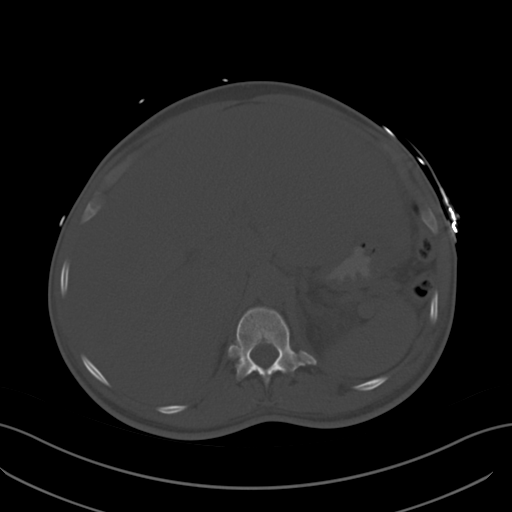
[im 79/100  soft-tissue]
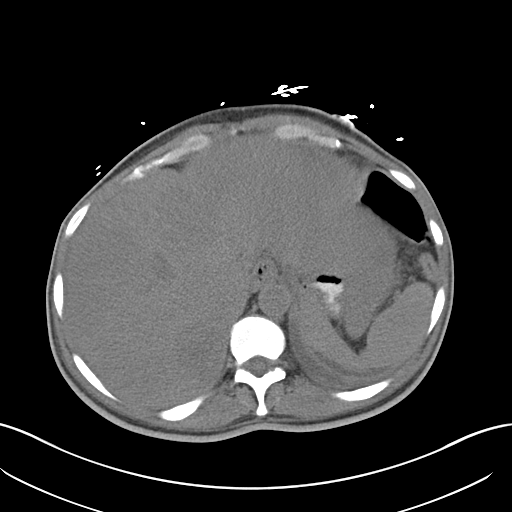
[im 84/100  soft-tissue]
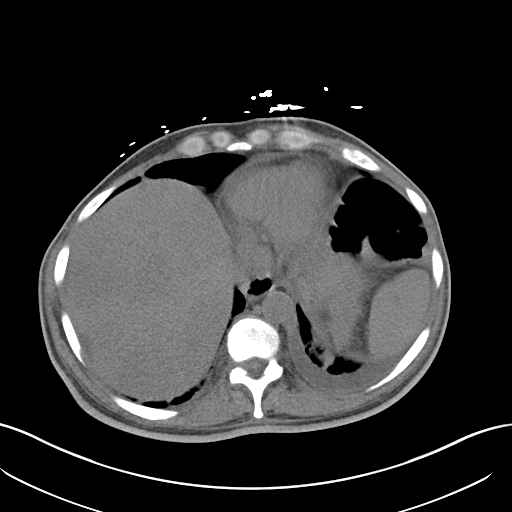
[im 94/100  soft-tissue]
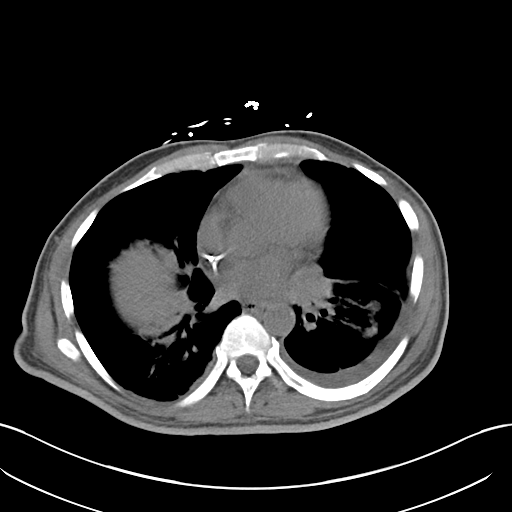

[Series 3: coronal · coronal · 0.71mm/px · 3 of 143 slices shown]
[im 48/143  soft-tissue]
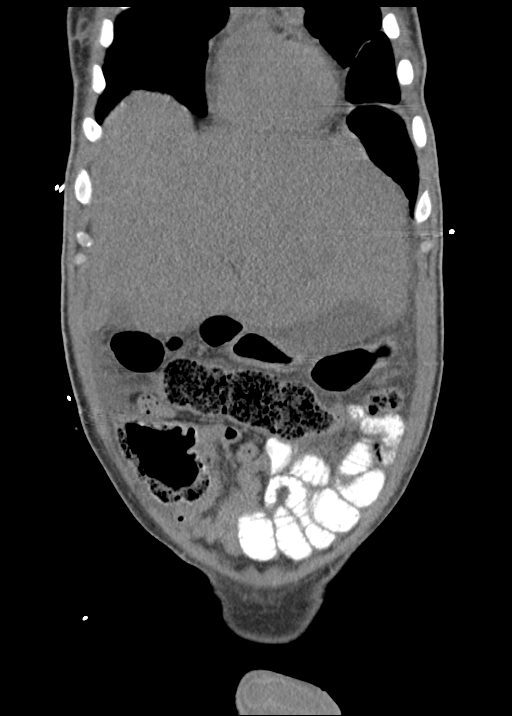
[im 64/143  soft-tissue]
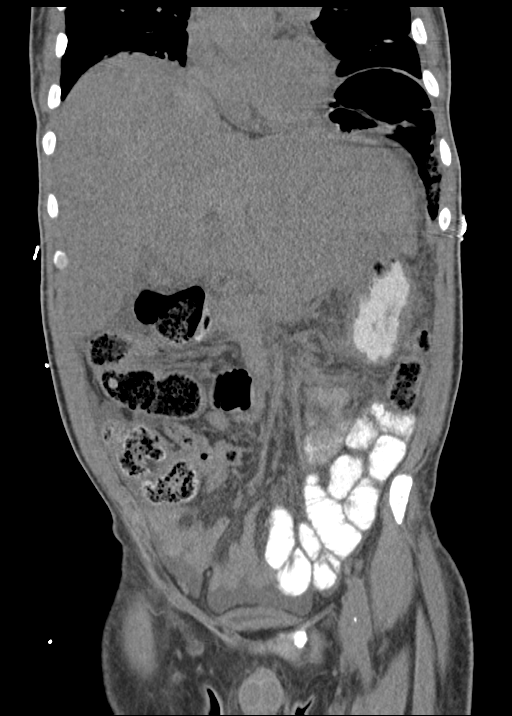
[im 79/143  soft-tissue]
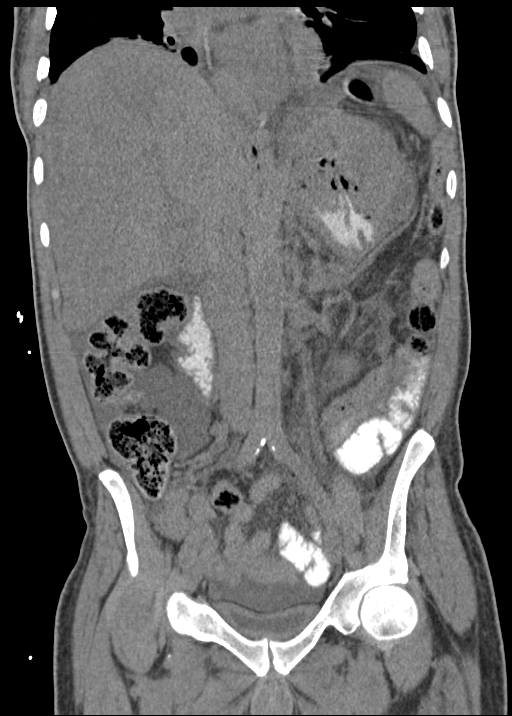

[15 of 46 positions shown; findings below may reference images not displayed]

FINDINGS: Evaluation of this exam is limited in the absence of intravenous
contrast.

Lower chest: There is a small left pleural effusion. There is a
x 1.7 cm lesion in the superior segment of the left lower lobe
abutting the fissure which is increased in size compared the prior
CT (previously measuring 1.1 x 1.0 cm). A 1.2 x 0.9 cm nodule at the
left lung base appears new or increased compared to prior CT.
Multiple small nodular density along the minor fissure measure up to
5 mm and appear new compared to the prior CT. There is a 3 mm nodule
at the right lung base (series 3, image 6). Partially visualized
bilateral hilar adenopathy. Subcarinal and mediastinal adenopathy.
Partially visualized bilobed masses along the left pericardium
measure 5.5 x 2.2 cm. There are bibasilar streaky densities, likely
atelectasis, although infiltrate is not excluded.

No intra-abdominal free air. Small ascites, new compared to the
prior CT.

Hepatobiliary: The liver is enlarged. There are multiple ill-defined
hypoechoic liver masses consistent with metastatic disease. There
has been significant interval increase in the size of the left lobe
of the liver compared to the prior CT. No intrahepatic biliary
dilatation. The gallbladder is contracted.

Pancreas: The pancreas is unremarkable.

Spleen: Normal in size without focal abnormality.

Adrenals/Urinary Tract: The kidneys, visualized ureters, and urinary
bladder appear unremarkable.

Stomach/Bowel: No evidence of bowel obstruction. The appendix is not
identified with certainty.

Vascular/Lymphatic: There is mild aortoiliac atherosclerotic
disease. Evaluation of the abdominal aorta and IVC is limited in the
absence of intravenous contrast. No portal venous gas identified. No
adenopathy.

Reproductive: The prostate and seminal vesicles are grossly
unremarkable.

Other: Mild diffuse subcutaneous edema and anasarca. No fluid
collection.

Musculoskeletal: No acute or significant osseous findings. Partially
visualized sclerotic changes of the sternum as seen on the prior CT
may be related to radiation changes.
IMPRESSION: 1. Interval progression of metastatic disease with enlargement of
the pulmonary nodules.
2. Partially visualized small left pleural effusion with no
significant interval change compared to prior study.
3. Hilar and mediastinal adenopathy is, partially visualized,
grossly unchanged.
4. Significant interval enlargement of the liver compared to the
prior CT with overall progression of metastatic disease. Evaluation
of the lesions however are limited in the absence of intravenous
contrast.
5. Small ascites, new compared to the prior CT.
6. No bowel obstruction.
